# Patient Record
Sex: Male | Born: 1965 | Race: White | Hispanic: No | Smoking: Current every day smoker
Health system: Southern US, Community
[De-identification: ages and names within clinical notes are randomized; demographics above are authoritative.]

## PROBLEM LIST (undated history)

## (undated) DIAGNOSIS — J45909 Unspecified asthma, uncomplicated: Secondary | ICD-10-CM

## (undated) DIAGNOSIS — F1010 Alcohol abuse, uncomplicated: Secondary | ICD-10-CM

## (undated) DIAGNOSIS — J449 Chronic obstructive pulmonary disease, unspecified: Secondary | ICD-10-CM

## (undated) DIAGNOSIS — F419 Anxiety disorder, unspecified: Secondary | ICD-10-CM

## (undated) DIAGNOSIS — F4310 Post-traumatic stress disorder, unspecified: Secondary | ICD-10-CM

## (undated) DIAGNOSIS — T8859XA Other complications of anesthesia, initial encounter: Secondary | ICD-10-CM

## (undated) DIAGNOSIS — IMO0001 Reserved for inherently not codable concepts without codable children: Secondary | ICD-10-CM

## (undated) DIAGNOSIS — F329 Major depressive disorder, single episode, unspecified: Secondary | ICD-10-CM

## (undated) DIAGNOSIS — Z5189 Encounter for other specified aftercare: Secondary | ICD-10-CM

## (undated) DIAGNOSIS — I10 Essential (primary) hypertension: Secondary | ICD-10-CM

## (undated) DIAGNOSIS — K219 Gastro-esophageal reflux disease without esophagitis: Secondary | ICD-10-CM

## (undated) DIAGNOSIS — F32A Depression, unspecified: Secondary | ICD-10-CM

## (undated) DIAGNOSIS — E119 Type 2 diabetes mellitus without complications: Secondary | ICD-10-CM

## (undated) HISTORY — DX: Alcohol abuse, uncomplicated: F10.10

## (undated) HISTORY — PX: TONSILLECTOMY: SUR1361

## (undated) HISTORY — PX: APPENDECTOMY: SHX54

## (undated) SURGERY — Surgical Case
Anesthesia: *Unknown

---

## 2003-07-31 ENCOUNTER — Encounter (HOSPITAL_COMMUNITY): Admission: RE | Admit: 2003-07-31 | Discharge: 2003-08-30 | Payer: Self-pay | Admitting: Preventative Medicine

## 2008-03-15 ENCOUNTER — Ambulatory Visit: Payer: Self-pay | Admitting: Gastroenterology

## 2008-03-23 ENCOUNTER — Ambulatory Visit (HOSPITAL_COMMUNITY): Admission: RE | Admit: 2008-03-23 | Discharge: 2008-03-23 | Payer: Self-pay | Admitting: Gastroenterology

## 2008-11-19 ENCOUNTER — Emergency Department (HOSPITAL_COMMUNITY): Admission: EM | Admit: 2008-11-19 | Discharge: 2008-11-19 | Payer: Self-pay | Admitting: Emergency Medicine

## 2009-01-25 ENCOUNTER — Ambulatory Visit (HOSPITAL_COMMUNITY): Admission: RE | Admit: 2009-01-25 | Discharge: 2009-01-25 | Payer: Self-pay | Admitting: Family Medicine

## 2009-02-09 ENCOUNTER — Encounter: Admission: RE | Admit: 2009-02-09 | Discharge: 2009-02-09 | Payer: Self-pay | Admitting: Family Medicine

## 2009-08-01 ENCOUNTER — Emergency Department (HOSPITAL_COMMUNITY): Admission: EM | Admit: 2009-08-01 | Discharge: 2009-08-02 | Payer: Self-pay | Admitting: Emergency Medicine

## 2010-04-24 ENCOUNTER — Inpatient Hospital Stay: Payer: Self-pay | Admitting: Psychiatry

## 2010-05-06 ENCOUNTER — Emergency Department (HOSPITAL_COMMUNITY)
Admission: EM | Admit: 2010-05-06 | Discharge: 2010-05-06 | Payer: Self-pay | Source: Home / Self Care | Admitting: Emergency Medicine

## 2010-06-08 ENCOUNTER — Emergency Department (HOSPITAL_COMMUNITY)
Admission: EM | Admit: 2010-06-08 | Discharge: 2010-06-09 | Payer: Self-pay | Source: Home / Self Care | Admitting: Emergency Medicine

## 2010-09-21 LAB — BASIC METABOLIC PANEL
GFR calc Af Amer: >60 mL/min (ref >60)
GFR calc non Af Amer: >60 mL/min (ref >60)
Potassium: 3.8 mEq/L (ref 3.5–5.1)
Sodium: 134 mEq/L — ABNORMAL LOW (ref 135–145)

## 2010-09-21 LAB — CBC
HCT: 49.6 % (ref 39.0–52.0)
Hemoglobin: 17.1 g/dL — ABNORMAL HIGH (ref 13.0–17.0)
Platelets: 202 10*3/uL (ref 150–400)
RBC: 4.72 MIL/uL (ref 4.22–5.81)
WBC: 4.4 10*3/uL (ref 4.0–10.5)

## 2010-09-21 LAB — DIFFERENTIAL
Eosinophils Relative: 3 % (ref 0–5)
Lymphocytes Relative: 33 % (ref 12–46)
Lymphs Abs: 1.5 10*3/uL (ref 0.7–4.0)
Monocytes Relative: 9 % (ref 3–12)

## 2010-09-21 LAB — RAPID URINE DRUG SCREEN, HOSP PERFORMED
Amphetamines: NOT DETECTED
Barbiturates: NOT DETECTED
Benzodiazepines: POSITIVE — AB

## 2010-09-21 LAB — ETHANOL: Alcohol, Ethyl (B): 244 mg/dL — ABNORMAL HIGH (ref 0–10)

## 2010-11-18 NOTE — Assessment & Plan Note (Signed)
NAME:  Gabriel Koch, Gabriel Koch              CHART#:  [id number]   DATE:  03/15/2008                       DOB:  06-12-1966   REFERRING PHYSICIAN:  Kirk Ruths, MD.   REASON FOR CONSULTATION:  Reflux, cough, vomiting, and nausea.   HISTORY OF PRESENT ILLNESS:  The patient is a 45 year old male with a  long-standing history of GERD.  He has been seen by Dr. Karilyn Cota in the  past.  No records are available.  He reports he does not have any  heartburn or indigestion while taking Prilosec.  His problem is in the  morning, he has lots of phlegm in the back of his throat, which makes  cough, gag, and then vomits.  He smokes 2 to 3 packs a day.  He has  increased that over the last 6 to 8 months.  He also has a lot of  sneezing and coughing related to the hair spray use and the perfume use  by his daughter and his wife.  He denies any bright red blood per rectum  or black stools.  His throat does not itch.  He has no problems  swallowing.  He knows what triggers his heartburn.  If he eats and goes  to bed, he will trigger.  He has had symptoms of GERD for 21 years.  He  has history of ulcers by EGD some years ago, which was treated with  Zantac and Tagamet and then he was advanced Prilosec.  He drinks a six  pack of beer a day.  He has had more stress over the 6 to 8 months.  His  work has gone down.   PAST MEDICAL HISTORY:  1. Multiple musculoskeletal complaints (neck, back, and head).  2. GERD.   PAST SURGICAL HISTORY:  1. Tonsillectomy in 21.  2. Appendectomy due to ruptured appendix.  3. Left knee surgery.   ALLERGIES:  Beestings.   MEDICATIONS:  1. Prilosec daily.  2. Tylenol 2 in the morning.  3. Darvocet as needed.   FAMILY HISTORY:  He denies any family history of colon cancer and colon  polyps.   SOCIAL HISTORY:  He is married.  He raises 2 stepchildren now, age 12  and 36.  He is a Buyer, retail.  He smokes and he drinks  alcohol.   REVIEW OF SYSTEMS:  As per  the HPI, otherwise all systems are negative.   PHYSICAL EXAMINATION:  VITAL SIGNS:  Weight 152 pounds, height 5 feet 9  inches, BMI 22.7 (healthy), temperature 98.2, blood pressure 124/82, and  pulse 80.GENERAL:  He is in no apparent stress.  Alert and oriented  x4.HEENT:  Atraumatic and normocephalic.  Pupils equal and react to  light.  Mouth, no oral lesions.NECK:  Posterior pharynx without erythema  or exudate.  His neck has full range of motion.  No  lymphadenopathy.LUNGS:  Clear to auscultation bilaterally.  CARDIOVASCULAR:  Shows regular rhythm.  No murmur.  Normal S1 and  S2.ABDOMEN:  Bowel sounds are present.  Soft, nontender, and  nondistended.  No rebound or guarding.  His liver edge appears to be  approximately 1 cm below the right costal margin.  It was unable to  appreciate splenomegaly.EXTREMITIES:  Having no cyanosis or  edema.NEUROLOGIC:  He has no focal neurologic deficits.   ASSESSMENT:  The patient is  a 45 year old male who has early morning  vomiting secondary to cough and posterior nasal drainage.  This he  probably has allergies and these are exacerbated by his cigarette smoke.  He has alcohol dependence. Thank you for allowing me to see the patient  in consultation.  My recommendations follow.   RECOMMENDATIONS:  1. He should continue the Prilosec, symptoms of GERD are well      controlled.  He may have an EGD in age 30 to screen for Barrett's      esophagus and at the same time, he needs a colonoscopy for average      of his colon cancer screening.  2. He is asked to use Zyrtec or Claritin at bedtime to address his      early morning secretions.  3. He is asked to discontinue or decrease his smoking.  4. Will check a hepatic function panel, ultrasound, CBC, and TSH due      to his history of hepatomegaly and alcohol abuse.  5. Return patient visit in 3 months.        Kassie Mends, M.D.  Electronically Signed     SM/MEDQ  D:  03/15/2008  T:   03/16/2008  Job:  [id number]   cc:   Kirk Ruths, M.D.

## 2011-09-09 ENCOUNTER — Other Ambulatory Visit: Payer: Self-pay

## 2011-09-09 ENCOUNTER — Encounter (HOSPITAL_COMMUNITY): Payer: Self-pay | Admitting: *Deleted

## 2011-09-09 ENCOUNTER — Emergency Department (HOSPITAL_COMMUNITY)
Admission: EM | Admit: 2011-09-09 | Discharge: 2011-09-10 | Disposition: A | Discharge status: Home / Self Care | Payer: Self-pay | Attending: Emergency Medicine | Admitting: Emergency Medicine

## 2011-09-09 DIAGNOSIS — F1010 Alcohol abuse, uncomplicated: Secondary | ICD-10-CM | POA: Insufficient documentation

## 2011-09-09 DIAGNOSIS — R112 Nausea with vomiting, unspecified: Secondary | ICD-10-CM | POA: Insufficient documentation

## 2011-09-09 DIAGNOSIS — R109 Unspecified abdominal pain: Secondary | ICD-10-CM | POA: Insufficient documentation

## 2011-09-09 DIAGNOSIS — R5381 Other malaise: Secondary | ICD-10-CM | POA: Insufficient documentation

## 2011-09-09 DIAGNOSIS — I10 Essential (primary) hypertension: Secondary | ICD-10-CM | POA: Insufficient documentation

## 2011-09-09 DIAGNOSIS — K921 Melena: Secondary | ICD-10-CM | POA: Insufficient documentation

## 2011-09-09 DIAGNOSIS — K5289 Other specified noninfective gastroenteritis and colitis: Secondary | ICD-10-CM | POA: Insufficient documentation

## 2011-09-09 DIAGNOSIS — K529 Noninfective gastroenteritis and colitis, unspecified: Secondary | ICD-10-CM

## 2011-09-09 HISTORY — DX: Gastro-esophageal reflux disease without esophagitis: K21.9

## 2011-09-09 HISTORY — DX: Essential (primary) hypertension: I10

## 2011-09-09 HISTORY — DX: Anxiety disorder, unspecified: F41.9

## 2011-09-09 HISTORY — DX: Reserved for inherently not codable concepts without codable children: IMO0001

## 2011-09-09 HISTORY — DX: Encounter for other specified aftercare: Z51.89

## 2011-09-09 HISTORY — DX: Depression, unspecified: F32.A

## 2011-09-09 HISTORY — DX: Major depressive disorder, single episode, unspecified: F32.9

## 2011-09-09 MED ORDER — SODIUM CHLORIDE 0.9 % IV SOLN
Freq: Once | INTRAVENOUS | Status: AC
  Administered 2011-09-09: via INTRAVENOUS

## 2011-09-09 MED ORDER — ONDANSETRON HCL 4 MG/2ML IJ SOLN
4.0000 mg | Freq: Once | INTRAMUSCULAR | Status: AC
  Administered 2011-09-09: 4 mg via INTRAVENOUS
  Filled 2011-09-09: qty 2

## 2011-09-09 NOTE — ED Provider Notes (Signed)
History     CSN: [id number]  Arrival date & time 09/09/11  2316   First MD Initiated Contact with Patient 09/09/11 2317      Chief Complaint  Patient presents with  . Rectal Bleeding  . Fatigue  . Alcohol Intoxication    (Consider location/radiation/quality/duration/timing/severity/associated sxs/prior treatment) HPI Comments: States had "two beers" tonight.  Patient is a 46 y.o. male presenting with hematochezia and intoxication. The history is provided by the patient.  Rectal Bleeding  The current episode started yesterday. The problem occurs continuously. The problem has been rapidly worsening. The pain is moderate. The stool is described as bloody and liquid. There was no prior successful therapy. There was no prior unsuccessful therapy. Associated symptoms include abdominal pain, nausea and vomiting. Pertinent negatives include no fever.  Alcohol Intoxication Associated symptoms include abdominal pain.    Past Medical History  Diagnosis Date  . Hypertension   . Blood transfusion   . GERD (gastroesophageal reflux disease)   . Anxiety and depression     Past Surgical History  Procedure Date  . Appendectomy   . Tonsillectomy     History reviewed. No pertinent family history.  History  Substance Use Topics  . Smoking status: Current Everyday Smoker -- 0.5 packs/day for 35 years    Types: Cigarettes  . Smokeless tobacco: Not on file  . Alcohol Use: 1.2 oz/week    2 Cans of beer per week      Review of Systems  Constitutional: Negative for fever.  Gastrointestinal: Positive for nausea, vomiting, abdominal pain and hematochezia.  All other systems reviewed and are negative.    Allergies  Bee venom  Home Medications  No current outpatient prescriptions on file.  BP 154/94  Pulse 103  Temp(Src) 98.1 F (36.7 C) (Oral)  Resp 18  Ht 5\' 9"  (1.753 m)  Wt 150 lb (68.04 kg)  BMI 22.15 kg/m2  SpO2 97%  Physical Exam  Nursing note and vitals  reviewed. Constitutional: He is oriented to person, place, and time. He appears well-developed and well-nourished. No distress.       Strong odor of alcohol  HENT:  Head: Normocephalic and atraumatic.  Mouth/Throat: Oropharynx is clear and moist.  Eyes: EOM are normal. Pupils are equal, round, and reactive to light.  Neck: Normal range of motion. Neck supple.  Cardiovascular: Normal rate and regular rhythm.   No murmur heard. Pulmonary/Chest: Effort normal and breath sounds normal. No respiratory distress. He has no wheezes.  Abdominal: Soft. Bowel sounds are normal. He exhibits no distension. There is no tenderness.  Musculoskeletal: Normal range of motion. He exhibits no edema.  Lymphadenopathy:    He has no cervical adenopathy.  Neurological: He is alert and oriented to person, place, and time.  Skin: Skin is warm and dry. He is not diaphoretic.    ED Course  Procedures (including critical care time)   Labs Reviewed  CBC  DIFFERENTIAL  COMPREHENSIVE METABOLIC PANEL  LIPASE, BLOOD  ETHANOL  CARDIAC PANEL(CRET KIN+CKTOT+MB+TROPI)   No results found.   No diagnosis found.   Date: 09/10/2011  Rate: 93  Rhythm: normal sinus rhythm  QRS Axis: normal  Intervals: normal  ST/T Wave abnormalities: normal  Conduction Disutrbances:right bundle branch block  Narrative Interpretation:   Old EKG Reviewed: none available    MDM  The labs look okay with the exception of the alcohol level which is not consistent with the two beers he tells me he took.  The Hb is  stable and there has been no further vomiting or bloody stool.  He has seen Dr. Minus Liberty in the past and I will recommend he see him if symptoms persist.  He is to take prilosec 20 mg twice daily until then.  When I initially entered the room, the patient was telling me he couldn't move his fingers.  When I asked him when this started, he told me earlier this afternoon.  I walked past him on my way in to start my shift  tonight and saw him smoking a cigarette using both hands and fingers without any difficulty or limitations.        Geoffery Lyons, MD 09/10/11 [phone]

## 2011-09-09 NOTE — ED Notes (Signed)
Pt has multiple complaints. States he has not had any energy lately. Bloody stool for 4 days.

## 2011-09-10 LAB — COMPREHENSIVE METABOLIC PANEL
ALT: 30 U/L (ref 0–53)
AST: 35 U/L (ref 0–37)
Albumin: 3.5 g/dL (ref 3.5–5.2)
Alkaline Phosphatase: 131 U/L — ABNORMAL HIGH (ref 39–117)
Potassium: 3.9 mEq/L (ref 3.5–5.1)
Sodium: 139 mEq/L (ref 135–145)
Total Protein: 6.9 g/dL (ref 6.0–8.3)

## 2011-09-10 LAB — DIFFERENTIAL
Basophils Relative: 1 % (ref 0–1)
Eosinophils Absolute: 0.2 10*3/uL (ref 0.0–0.7)
Lymphs Abs: 1.7 10*3/uL (ref 0.7–4.0)
Neutrophils Relative %: 47 % (ref 43–77)

## 2011-09-10 LAB — CBC
MCH: 36 pg — ABNORMAL HIGH (ref 26.0–34.0)
MCHC: 36.4 g/dL — ABNORMAL HIGH (ref 30.0–36.0)
Platelets: 190 10*3/uL (ref 150–400)
RBC: 4.53 MIL/uL (ref 4.22–5.81)

## 2011-09-10 LAB — CARDIAC PANEL(CRET KIN+CKTOT+MB+TROPI)
CK, MB: 2.5 ng/mL (ref 0.3–4.0)
Troponin I: <0.3 ng/mL (ref <0.30)

## 2011-09-10 NOTE — Discharge Instructions (Signed)
Viral Gastroenteritis Viral gastroenteritis is also known as stomach flu. This condition affects the stomach and intestinal tract. It can cause sudden diarrhea and vomiting. The illness typically lasts 3 to 8 days. Most people develop an immune response that eventually gets rid of the virus. While this natural response develops, the virus can make you quite ill. CAUSES  Many different viruses can cause gastroenteritis, such as rotavirus or noroviruses. You can catch one of these viruses by consuming contaminated food or water. You may also catch a virus by sharing utensils or other personal items with an infected person or by touching a contaminated surface. SYMPTOMS  The most common symptoms are diarrhea and vomiting. These problems can cause a severe loss of body fluids (dehydration) and a body salt (electrolyte) imbalance. Other symptoms may include:  Fever.   Headache.   Fatigue.   Abdominal pain.  DIAGNOSIS  Your caregiver can usually diagnose viral gastroenteritis based on your symptoms and a physical exam. A stool sample may also be taken to test for the presence of viruses or other infections. TREATMENT  This illness typically goes away on its own. Treatments are aimed at rehydration. The most serious cases of viral gastroenteritis involve vomiting so severely that you are not able to keep fluids down. In these cases, fluids must be given through an intravenous line (IV). HOME CARE INSTRUCTIONS   Drink enough fluids to keep your urine clear or pale yellow. Drink small amounts of fluids frequently and increase the amounts as tolerated.   Ask your caregiver for specific rehydration instructions.   Avoid:   Foods high in sugar.   Alcohol.   Carbonated drinks.   Tobacco.   Juice.   Caffeine drinks.   Extremely hot or cold fluids.   Fatty, greasy foods.   Too much intake of anything at one time.   Dairy products until 24 to 48 hours after diarrhea stops.   You may  consume probiotics. Probiotics are active cultures of beneficial bacteria. They may lessen the amount and number of diarrheal stools in adults. Probiotics can be found in yogurt with active cultures and in supplements.   Wash your hands well to avoid spreading the virus.   Only take over-the-counter or prescription medicines for pain, discomfort, or fever as directed by your caregiver. Do not give aspirin to children. Antidiarrheal medicines are not recommended.   Ask your caregiver if you should continue to take your regular prescribed and over-the-counter medicines.   Keep all follow-up appointments as directed by your caregiver.  SEEK IMMEDIATE MEDICAL CARE IF:   You are unable to keep fluids down.   You do not urinate at least once every 6 to 8 hours.   You develop shortness of breath.   You notice blood in your stool or vomit. This may look like coffee grounds.   You have abdominal pain that increases or is concentrated in one small area (localized).   You have persistent vomiting or diarrhea.   You have a fever.   The patient is a child younger than 3 months, and he or she has a fever.   The patient is a child older than 3 months, and he or she has a fever and persistent symptoms.   The patient is a child older than 3 months, and he or she has a fever and symptoms suddenly get worse.   The patient is a baby, and he or she has no tears when crying.  MAKE SURE YOU:     Understand these instructions.   Will watch your condition.   Will get help right away if you are not doing well or get worse.  Document Released: 06/22/2005 Document Revised: 06/11/2011 Document Reviewed: 04/08/2011 Exit[employer] Patient Information 2012 [employer]. 

## 2011-09-10 NOTE — ED Notes (Signed)
Discharge instructions reviewed with pt, questions answered. Pt verbalized understanding.  

## 2012-04-15 DIAGNOSIS — R059 Cough, unspecified: Secondary | ICD-10-CM | POA: Insufficient documentation

## 2012-04-15 DIAGNOSIS — R05 Cough: Secondary | ICD-10-CM | POA: Insufficient documentation

## 2012-04-15 DIAGNOSIS — R071 Chest pain on breathing: Secondary | ICD-10-CM | POA: Insufficient documentation

## 2012-04-15 DIAGNOSIS — I10 Essential (primary) hypertension: Secondary | ICD-10-CM | POA: Insufficient documentation

## 2012-04-15 DIAGNOSIS — Z9089 Acquired absence of other organs: Secondary | ICD-10-CM | POA: Insufficient documentation

## 2012-04-15 NOTE — ED Notes (Addendum)
Pt reporting pain in right side for several weeks, also reports productive cough for several weeks.  Pt also stating that his chest is hurting.  Pt also reports drinking  40oz tonight.

## 2012-04-16 ENCOUNTER — Emergency Department (HOSPITAL_COMMUNITY): Payer: Self-pay

## 2012-04-16 ENCOUNTER — Encounter (HOSPITAL_COMMUNITY): Payer: Self-pay | Admitting: *Deleted

## 2012-04-16 ENCOUNTER — Emergency Department (HOSPITAL_COMMUNITY)
Admission: EM | Admit: 2012-04-16 | Discharge: 2012-04-16 | Disposition: A | Discharge status: Home / Self Care | Payer: Self-pay | Attending: Emergency Medicine | Admitting: Emergency Medicine

## 2012-04-16 DIAGNOSIS — R0789 Other chest pain: Secondary | ICD-10-CM

## 2012-04-16 DIAGNOSIS — R05 Cough: Secondary | ICD-10-CM

## 2012-04-16 DIAGNOSIS — R059 Cough, unspecified: Secondary | ICD-10-CM

## 2012-04-16 MED ORDER — HYDROCODONE-ACETAMINOPHEN 5-325 MG PO TABS
1.0000 | ORAL_TABLET | Freq: Once | ORAL | Status: AC
  Administered 2012-04-16: 1 via ORAL
  Filled 2012-04-16: qty 1

## 2012-04-16 MED ORDER — HYDROCODONE-ACETAMINOPHEN 5-325 MG PO TABS: 1.0000 | ORAL_TABLET | ORAL | Status: AC | PRN

## 2012-04-16 MED ORDER — IBUPROFEN 800 MG PO TABS: 800.0000 mg | ORAL_TABLET | Freq: Three times a day (TID) | ORAL | Status: DC

## 2012-04-16 MED ORDER — ALBUTEROL SULFATE (5 MG/ML) 0.5% IN NEBU
2.5000 mg | INHALATION_SOLUTION | Freq: Once | RESPIRATORY_TRACT | Status: AC
  Administered 2012-04-16: 2.5 mg via RESPIRATORY_TRACT
  Filled 2012-04-16: qty 0.5

## 2012-04-16 MED ORDER — IBUPROFEN 800 MG PO TABS
800.0000 mg | ORAL_TABLET | Freq: Once | ORAL | Status: AC
  Administered 2012-04-16: 800 mg via ORAL
  Filled 2012-04-16: qty 1

## 2012-04-16 NOTE — ED Notes (Signed)
Patient states his leg are cramping and that when vomiting in the waiting room there was blood in his vomit.
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
## 2012-04-16 NOTE — ED Notes (Signed)
Pt alert & oriented x4, stable gait. Patient given discharge instructions, paperwork & prescription(s). Patient  instructed to stop at the registration desk to finish any additional paperwork. Patient verbalized understanding. Pt left department w/ no further questions. 

## 2012-04-16 NOTE — ED Notes (Signed)
Patient given a Coke per RN approval. 
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
## 2012-04-16 NOTE — ED Notes (Signed)
Respiratory paged for a breathing treatment at this time.  

## 2012-06-16 ENCOUNTER — Inpatient Hospital Stay: Payer: Self-pay | Admitting: Psychiatry

## 2012-06-16 LAB — TSH: Thyroid Stimulating Horm: 2.02 u[IU]/mL

## 2012-06-16 LAB — COMPREHENSIVE METABOLIC PANEL
Albumin: 3.5 g/dL (ref 3.4–5.0)
Alkaline Phosphatase: 146 U/L — ABNORMAL HIGH (ref 50–136)
BUN: 4 mg/dL — ABNORMAL LOW (ref 7–18)
Calcium, Total: 8 mg/dL — ABNORMAL LOW (ref 8.5–10.1)
Glucose: 88 mg/dL (ref 65–99)
Osmolality: 272 (ref 275–301)
SGOT(AST): 53 U/L — ABNORMAL HIGH (ref 15–37)
Sodium: 138 mmol/L (ref 136–145)
Total Protein: 6.9 g/dL (ref 6.4–8.2)

## 2012-06-16 LAB — URINALYSIS, COMPLETE
Blood: NEGATIVE
Leukocyte Esterase: NEGATIVE
Nitrite: NEGATIVE
Ph: 5 (ref 4.5–8.0)
Protein: NEGATIVE
Specific Gravity: 1.002 (ref 1.003–1.030)

## 2012-06-16 LAB — CBC
HCT: 42.7 % (ref 40.0–52.0)
MCH: 36.2 pg — ABNORMAL HIGH (ref 26.0–34.0)
MCHC: 34.3 g/dL (ref 32.0–36.0)
MCV: 106 fL — ABNORMAL HIGH (ref 80–100)
RDW: 13.8 % (ref 11.5–14.5)

## 2012-06-16 LAB — DRUG SCREEN, URINE
Barbiturates, Ur Screen: NEGATIVE (ref ?–200)
Cannabinoid 50 Ng, Ur ~~LOC~~: NEGATIVE (ref ?–50)
Cocaine Metabolite,Ur ~~LOC~~: NEGATIVE (ref ?–300)
MDMA (Ecstasy)Ur Screen: NEGATIVE (ref ?–500)
Phencyclidine (PCP) Ur S: NEGATIVE (ref ?–25)

## 2012-06-17 LAB — BEHAVIORAL MEDICINE 1 PANEL
Anion Gap: 6 — ABNORMAL LOW (ref 7–16)
Calcium, Total: 8.2 mg/dL — ABNORMAL LOW (ref 8.5–10.1)
Chloride: 104 mmol/L (ref 98–107)
Co2: 28 mmol/L (ref 21–32)
EGFR (African American): >60
EGFR (Non-African Amer.): >60
Eosinophil #: 0.1 10*3/uL (ref 0.0–0.7)
Lymphocyte #: 1.1 10*3/uL (ref 1.0–3.6)
MCH: 36.9 pg — ABNORMAL HIGH (ref 26.0–34.0)
MCHC: 34.8 g/dL (ref 32.0–36.0)
MCV: 106 fL — ABNORMAL HIGH (ref 80–100)
Monocyte #: 0.3 x10 3/mm (ref 0.2–1.0)
Neutrophil %: 48 %
Platelet: 117 10*3/uL — ABNORMAL LOW (ref 150–440)
Potassium: 4 mmol/L (ref 3.5–5.1)
RDW: 13.6 % (ref 11.5–14.5)
SGOT(AST): 30 U/L (ref 15–37)
SGPT (ALT): 23 U/L (ref 12–78)
Total Protein: 5.7 g/dL — ABNORMAL LOW (ref 6.4–8.2)

## 2012-06-18 LAB — URINALYSIS, COMPLETE
Bilirubin,UR: NEGATIVE
Glucose,UR: 150 mg/dL (ref 0–75)
Leukocyte Esterase: NEGATIVE
Nitrite: NEGATIVE
RBC,UR: <1 /HPF (ref 0–5)

## 2012-07-25 ENCOUNTER — Emergency Department: Payer: Self-pay | Admitting: Emergency Medicine

## 2012-07-25 LAB — URINALYSIS, COMPLETE
Bacteria: NONE SEEN
Ketone: NEGATIVE
Leukocyte Esterase: NEGATIVE
RBC,UR: NONE SEEN /HPF (ref 0–5)
Specific Gravity: 1.001 (ref 1.003–1.030)
Squamous Epithelial: NONE SEEN

## 2012-07-25 LAB — COMPREHENSIVE METABOLIC PANEL
Albumin: 3.8 g/dL (ref 3.4–5.0)
Calcium, Total: 8.3 mg/dL — ABNORMAL LOW (ref 8.5–10.1)
Chloride: 101 mmol/L (ref 98–107)
Creatinine: 0.75 mg/dL (ref 0.60–1.30)
Glucose: 82 mg/dL (ref 65–99)
Osmolality: 264 (ref 275–301)
Potassium: 3.6 mmol/L (ref 3.5–5.1)
SGPT (ALT): 22 U/L (ref 12–78)
Sodium: 134 mmol/L — ABNORMAL LOW (ref 136–145)
Total Protein: 7.9 g/dL (ref 6.4–8.2)

## 2012-07-25 LAB — CBC
MCHC: 35.7 g/dL (ref 32.0–36.0)
MCV: 105 fL — ABNORMAL HIGH (ref 80–100)
RBC: 4.22 10*6/uL — ABNORMAL LOW (ref 4.40–5.90)
RDW: 13.1 % (ref 11.5–14.5)
WBC: 5.7 10*3/uL (ref 3.8–10.6)

## 2012-07-25 LAB — DRUG SCREEN, URINE
Barbiturates, Ur Screen: NEGATIVE (ref ?–200)
MDMA (Ecstasy)Ur Screen: NEGATIVE (ref ?–500)

## 2012-07-25 LAB — TSH: Thyroid Stimulating Horm: 3.12 u[IU]/mL

## 2012-07-26 ENCOUNTER — Emergency Department: Payer: Self-pay | Admitting: Emergency Medicine

## 2012-07-26 LAB — DRUG SCREEN, URINE
Barbiturates, Ur Screen: NEGATIVE (ref ?–200)
Cannabinoid 50 Ng, Ur ~~LOC~~: NEGATIVE (ref ?–50)
MDMA (Ecstasy)Ur Screen: NEGATIVE (ref ?–500)
Opiate, Ur Screen: NEGATIVE (ref ?–300)

## 2012-07-26 LAB — ETHANOL
Ethanol %: 0.134 % — ABNORMAL HIGH (ref 0.000–0.080)
Ethanol: 134 mg/dL

## 2012-08-22 LAB — COMPREHENSIVE METABOLIC PANEL
Albumin: 3.6 g/dL (ref 3.4–5.0)
Alkaline Phosphatase: 72 U/L (ref 50–136)
Calcium, Total: 8.5 mg/dL (ref 8.5–10.1)
Chloride: 102 mmol/L (ref 98–107)
Co2: 25 mmol/L (ref 21–32)
EGFR (African American): >60
Glucose: 57 mg/dL — ABNORMAL LOW (ref 65–99)
Osmolality: 266 (ref 275–301)
Potassium: 3.4 mmol/L — ABNORMAL LOW (ref 3.5–5.1)
SGOT(AST): 25 U/L (ref 15–37)
Sodium: 136 mmol/L (ref 136–145)
Total Protein: 7.5 g/dL (ref 6.4–8.2)

## 2012-08-22 LAB — DRUG SCREEN, URINE
Barbiturates, Ur Screen: NEGATIVE (ref ?–200)
Benzodiazepine, Ur Scrn: NEGATIVE (ref ?–200)
Cannabinoid 50 Ng, Ur ~~LOC~~: NEGATIVE (ref ?–50)
Cocaine Metabolite,Ur ~~LOC~~: NEGATIVE (ref ?–300)
MDMA (Ecstasy)Ur Screen: NEGATIVE (ref ?–500)
Phencyclidine (PCP) Ur S: NEGATIVE (ref ?–25)

## 2012-08-22 LAB — CBC
HGB: 14.4 g/dL (ref 13.0–18.0)
MCH: 36.1 pg — ABNORMAL HIGH (ref 26.0–34.0)
RBC: 3.99 10*6/uL — ABNORMAL LOW (ref 4.40–5.90)
WBC: 7.2 10*3/uL (ref 3.8–10.6)

## 2012-08-22 LAB — ACETAMINOPHEN LEVEL: Acetaminophen: <2 ug/mL

## 2012-08-22 LAB — ETHANOL: Ethanol %: 0.105 % — ABNORMAL HIGH (ref 0.000–0.080)

## 2012-08-23 ENCOUNTER — Inpatient Hospital Stay: Payer: Self-pay | Admitting: Psychiatry

## 2012-08-24 LAB — BEHAVIORAL MEDICINE 1 PANEL
Albumin: 3.1 g/dL — ABNORMAL LOW (ref 3.4–5.0)
Alkaline Phosphatase: 92 U/L (ref 50–136)
Anion Gap: 10 (ref 7–16)
BUN: 14 mg/dL (ref 7–18)
Basophil #: 0.1 10*3/uL (ref 0.0–0.1)
Basophil %: 1 %
Bilirubin,Total: 0.3 mg/dL (ref 0.2–1.0)
Calcium, Total: 8.6 mg/dL (ref 8.5–10.1)
Chloride: 107 mmol/L (ref 98–107)
Co2: 25 mmol/L (ref 21–32)
Creatinine: 0.95 mg/dL (ref 0.60–1.30)
EGFR (African American): >60
EGFR (Non-African Amer.): >60
Eosinophil #: 0.8 10*3/uL — ABNORMAL HIGH (ref 0.0–0.7)
Eosinophil %: 14.4 %
Glucose: 142 mg/dL — ABNORMAL HIGH (ref 65–99)
HCT: 38.3 % — ABNORMAL LOW (ref 40.0–52.0)
HGB: 13.3 g/dL (ref 13.0–18.0)
Lymphocyte #: 1.7 10*3/uL (ref 1.0–3.6)
Lymphocyte %: 32 %
MCH: 35.7 pg — ABNORMAL HIGH (ref 26.0–34.0)
MCHC: 34.8 g/dL (ref 32.0–36.0)
MCV: 102 fL — ABNORMAL HIGH (ref 80–100)
Monocyte #: 0.6 x10 3/mm (ref 0.2–1.0)
Monocyte %: 12 %
Neutrophil #: 2.2 10*3/uL (ref 1.4–6.5)
Neutrophil %: 40.6 %
Osmolality: 286 (ref 275–301)
Platelet: 239 10*3/uL (ref 150–440)
Potassium: 3.6 mmol/L (ref 3.5–5.1)
RBC: 3.74 10*6/uL — ABNORMAL LOW (ref 4.40–5.90)
RDW: 12.7 % (ref 11.5–14.5)
SGOT(AST): 19 U/L (ref 15–37)
SGPT (ALT): 18 U/L (ref 12–78)
Sodium: 142 mmol/L (ref 136–145)
Thyroid Stimulating Horm: 1.9 u[IU]/mL
Total Protein: 6.3 g/dL — ABNORMAL LOW (ref 6.4–8.2)
WBC: 5.3 10*3/uL (ref 3.8–10.6)

## 2012-08-24 LAB — URINALYSIS, COMPLETE
Bacteria: NONE SEEN
Bilirubin,UR: NEGATIVE
Blood: NEGATIVE
Glucose,UR: NEGATIVE mg/dL (ref 0–75)
Ketone: NEGATIVE
Leukocyte Esterase: NEGATIVE
Nitrite: NEGATIVE
Ph: 5 (ref 4.5–8.0)
Protein: NEGATIVE
RBC,UR: 3 /HPF (ref 0–5)
Specific Gravity: 1.027 (ref 1.003–1.030)
Squamous Epithelial: <1
WBC UR: 2 /HPF (ref 0–5)

## 2013-03-09 ENCOUNTER — Emergency Department: Payer: Self-pay | Admitting: Emergency Medicine

## 2013-03-09 LAB — URINALYSIS, COMPLETE
Bilirubin,UR: NEGATIVE
Blood: NEGATIVE
Glucose,UR: NEGATIVE mg/dL
Ketone: NEGATIVE
Leukocyte Esterase: NEGATIVE
Nitrite: NEGATIVE
Ph: 6
Protein: NEGATIVE
RBC,UR: NONE SEEN /HPF
Specific Gravity: 1.002
Squamous Epithelial: <1
WBC UR: <1 /HPF

## 2013-03-09 LAB — CBC
HCT: 39.6 % — ABNORMAL LOW
HGB: 14.5 g/dL
MCH: 34.4 pg — ABNORMAL HIGH
MCHC: 36.7 g/dL — ABNORMAL HIGH
MCV: 94 fL
Platelet: 264 x10 3/mm 3
RBC: 4.23 x10 6/mm 3 — ABNORMAL LOW
RDW: 12.6 %
WBC: 6.3 x10 3/mm 3

## 2013-03-09 LAB — DRUG SCREEN, URINE
Amphetamines, Ur Screen: NEGATIVE
Barbiturates, Ur Screen: NEGATIVE
Benzodiazepine, Ur Scrn: NEGATIVE
Cannabinoid 50 Ng, Ur ~~LOC~~: NEGATIVE
Cocaine Metabolite,Ur ~~LOC~~: NEGATIVE
MDMA (Ecstasy)Ur Screen: NEGATIVE
Methadone, Ur Screen: NEGATIVE
Opiate, Ur Screen: NEGATIVE
Phencyclidine (PCP) Ur S: NEGATIVE
Tricyclic, Ur Screen: NEGATIVE

## 2013-03-09 LAB — COMPREHENSIVE METABOLIC PANEL
Alkaline Phosphatase: 87 U/L (ref 50–136)
Anion Gap: 9 (ref 7–16)
Bilirubin,Total: 0.4 mg/dL (ref 0.2–1.0)
Calcium, Total: 8.4 mg/dL — ABNORMAL LOW (ref 8.5–10.1)
Creatinine: 0.86 mg/dL (ref 0.60–1.30)
Osmolality: 262 (ref 275–301)
Potassium: 2.7 mmol/L — ABNORMAL LOW (ref 3.5–5.1)

## 2013-03-09 LAB — ETHANOL
Ethanol %: 0.103 % — ABNORMAL HIGH (ref 0.000–0.080)
Ethanol: 103 mg/dL

## 2014-10-23 NOTE — Discharge Summary (Signed)
PATIENT NAME:  Gabriel Koch, Gabriel Koch MR#:  [id number] DATE OF BIRTH:  1966/04/05  DATE OF ADMISSION:  06/16/2012 DATE OF DISCHARGE:  06/24/2012  HOSPITAL COURSE: See dictated history and physical from admission. This 49 year old man with a history of alcohol dependence and anxiety problems came into the hospital intoxicated, having alcohol withdrawal, requesting detox. Mood was depressed. He was not having any suicidal ideation. He has a stated history of chronic anxiety. In the hospital, he was detoxed using the standard protocol and that went uneventfully. He did not have a seizure or show any signs of active delirium other than a little bit of confusion for a couple of days. For the last several days, his thoughts have been clear, and he has been participating appropriately on the unit. He requested further treatment for his anxiety disorder and gives a history that sounds like social anxiety. He was counseled about treatment and started on Prozac 20 mg per day. He is also taking trazodone 150 mg at night for sleep. He has tolerated medications well with no side effects. He continues to participate in groups actively. He was offered the opportunity to go to the [facility] [place] but declined it. He has denied suicidal ideation and not shown any suicidal or dangerous behavior in the hospital. The patient evidently is quite lonely and isolated at home. It was clear in the last few days of his hospital stay that he wanted to continue to stay here because he simply preferred it to the environment at home. At this point he has been here for several extra days after having had a brief fever and a bit of a cold. At this point he is now stable, not acutely dangerous, has been educated about substance abuse, and is tolerating his medication well. He will be discharged to follow-up with [facility] or similar services in the community.   DISCHARGE MEDICATIONS: Prozac 20 mg p.o. daily, ranitidine 150 mg  p.o. q.12, trazodone 150 mg at bedtime.   LABORATORY RESULTS: Admission labs showed chemistry with a slightly low calcium at 8.2, total protein slightly low at 5.7, albumin 2.8. CBC showed low white count at 2.9, hematocrit low at 37.8, MCV elevated at 106. Drug screen negative. TSH normal. Alcohol level 236. Follow-up chemistries showed slight improvement but he continued to have low protein and albumin. Urinalysis unremarkable.   MENTAL STATUS EXAM AT DISCHARGE: Casually dressed, neatly groomed man who looks his stated age. Cooperative with the exam. Good eye contact. Normal psychomotor activity. Speech normal in rate, tone, and volume. Affect euthymic, reactive and appropriate. Mood stated as being okay. Thoughts are lucid without loosening of associations or delusional thinking. Denies auditory or visual hallucinations. Denies suicidal or homicidal ideation. Reasonable judgment and insight. Baseline intelligence probably normal, short and long-term memory appear grossly intact.   DISPOSITION: Discharge home. Follow-up recommended with substance abuse treatment in the community and mental health follow-up most likely was [facility].   DIAGNOSIS PRINCIPLE AND PRIMARY:  AXIS I: Alcohol dependence.   SECONDARY DIAGNOSES:  AXIS I: Anxiety disorder, not otherwise specified.  AXIS II: Deferred.  AXIS III: Alcohol withdrawal, gastric reflux symptoms, a history of headaches.  AXIS IV: Severe from isolation and lack of support.  AXIS V: Functioning at time of discharge: 73.   ____________________________ Gonzella Lex, MD jtc:jm D: 06/24/2012 13:31:15 ET T: 06/25/2012 16:20:25 ET JOB#: [id number]  cc: Gonzella Lex, MD, <Dictator> Gonzella Lex MD ELECTRONICALLY SIGNED 06/26/2012 13:50
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
## 2014-10-26 NOTE — Consult Note (Signed)
Brief Consult Note: Diagnosis: Alcohol Dependence, Mood DO NOS.   Patient was seen by consultant.   Consult note dictated.   Recommend further assessment or treatment.   Comments: Start Librium 25mg  po BID x 2 days  Monitor for safety and discussed about going to [facility].  Will re-[place] in next 24 hours.  Electronic Signatures: Jeronimo Norma (MD)  (Signed 21-Jan-14 10:21)  Authored: Brief Consult Note   Last Updated: 21-Jan-14 10:21 by Jeronimo Norma (MD)

## 2014-10-26 NOTE — Consult Note (Signed)
Brief Consult Note: Diagnosis: ALCOHOL AND OPIOID DEPENDENCE.   Patient was seen by consultant.   Consult note dictated.   Recommend further assessment or treatment.   Orders entered.   Comments: Mr. Pintor has a h/o substance abuse. He desires substance abuse treatment.  PLAN: 1. The patient is referred to [facility].  2. Will continue detox protocol.  3. Psychiatry will follow up.  Electronic Signatures: Orson Slick (MD)  (Signed 05-Sep-14 15:10)  Authored: Brief Consult Note   Last Updated: 05-Sep-14 15:10 by Orson Slick (MD)

## 2014-10-26 NOTE — Consult Note (Signed)
PATIENT NAME:  Gabriel Koch, Gabriel Koch MR#:  [id number] DATE OF BIRTH:  08-17-1965  DATE OF ADMISSION: 03/09/2013  DATE OF CONSULTATION:  03/10/2013  REQUESTING PHYSICIAN: Dr. Marjean Donna  CONSULTING PHYSICIAN:  Jolanta B. Pucilowska, MD  REASON FOR CONSULTATION: To evaluate a suicidal patient.   IDENTIFYING DATA: Gabriel Koch is a 49 year old male with history of mood instability and substance abuse.   CHIEF COMPLAINT: "I need help."    HISTORY OF PRESENT ILLNESS: Gabriel Koch came to the Emergency Room asking for help. It is unclear whether this was precipitated by the fact that he just lost housing, or whether he truly wants to get better. He has been to substance abuse treatment multiple times, but explains to me that it was never his idea and he has never been interested. This time he is. He reports that he was just released from prison after 6 months. He spent time in jail for probation violation. 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 He on one occasion tells me that he sleeps with his family and 4 children, on another that he is homeless, used to stay with friends but [place] the [place]. In addition to alcohol, he reports that he had two 40-ounce beers on the day of admission. He uses narcotic pain killers. He reports that while in jail, he was not given any of his medication that was prescribed during his most recent hospitalization in February, but he also reminds me that he has no money to pay for them. Therefore, he has not been taking his medications in the community, either. He tells me in secret, between you and me, that he has been very, very depressed and thinks of suicide frequently. He denies psychotic symptoms, denies symptoms suggestive of bipolar mania. He denies illicit substance use, but endorses use of prescription pills.   PAST PSYCHIATRIC HISTORY: He has been to rehab at least twice in the past. He is asking for long-term substance abuse treatment  program, and would gladly go to [facility] for 2 years. He asks me to vouch for him there. He denies suicide attempt. He was admitted to [facility] at least 3 times in the past for alcohol dependence and depression.   FAMILY PSYCHIATRIC HISTORY:  Father with drinking problem.   PAST MEDICAL HISTORY:  Gastric reflux, alcoholic liver disease.   MEDICATIONS ON ADMISSION: None.   ALLERGIES: BEE STINGS.   SOCIAL HISTORY: Totally unclear. I think he is homeless. He has no income or health insurance. He was recently jailed. It is unclear whether his legal troubles are over.   REVIEW OF SYSTEMS: CONSTITUTIONAL: No fevers or chills. No weight changes.  EYES: No double or blurred vision.  EARS, NOSE, THROAT: No hearing loss.  RESPIRATORY: No shortness of breath or cough.  CARDIOVASCULAR: No chest pain or orthopnea.  GASTROINTESTINAL: No abdominal pain, nausea, vomiting or diarrhea.  GENITOURINARY: No incontinence or frequency.  ENDOCRINE: No heat or cold intolerance.  LYMPHATIC: No anemia or easy bruising.  INTEGUMENTARY: No acne or rash.  MUSCULOSKELETAL: No muscle or joint pain.  NEUROLOGIC: No tingling or weakness.  PSYCHIATRIC: See history of present illness for details.   PHYSICAL EXAMINATION: VITAL SIGNS: Blood pressure 123/65, pulse 82, respirations 18, temperature 98.3.  GENERAL: This is well-developed male in no acute distress.   LABORATORY DATA: Chemistries are within normal limits, except for sodium 132, potassium 2.7. Blood alcohol level is 0.103. LFTs within normal limits. Urine  tox screen negative for substances. CBC within normal limits, with mild anemia. Urinalysis is not suggestive of urinary tract infection.   MENTAL STATUS EXAMINATION: The patient is alert and oriented to person, place, time and situation. He is pleasant, polite and cooperative. It is unclear whether he is slightly confused or confabulates for me, answering the same question  differently. He is examined in the Emergency Room. He is in bed, wearing hospital scrubs. He maintains good eye contact. His speech is soft. Mood is depressed, with flat affect. Thought processing is logical. Thought content: He denies suicidal or homicidal ideation, but initially stated that he was suicidal. There are no delusions or paranoia. There are no auditory or visual hallucinations. His cognition is grossly intact. His insight and judgment are poor.   SUICIDE RISK ASSESSMENT: This is a patient with long history of substance abuse and mood instability, who threatened suicide while under the influence. He is at increased risk of suicide.   DIAGNOSES: AXIS I:  Alcohol dependence. Major depressive disorder, recurrent, severe.  AXIS II:  Deferred.  AXIS III:  Gastroesophageal reflux disease.  AXIS IV: Mental illness, substance abuse, treatment compliance, employment, financial, housing, primary support, access to care.  AXIS V:  GAF 25.   PLAN:  1.  The patient is on IVC. He was referred to [facility] rehab facility. He agrees with the plan.  2.  He was started on CIWA protocol to prevent alcohol withdrawal seizures.  3.  Psychiatry will follow up.    ____________________________ Wardell Honour. Bary Leriche, MD jbp:mr D: 03/10/2013 22:02:44 ET T: 03/10/2013 22:42:22 ET JOB#: [id number]  cc: Jolanta B. Bary Leriche, MD, <Dictator> Clovis Fredrickson MD ELECTRONICALLY SIGNED 03/14/2013 6:29
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
## 2014-10-26 NOTE — Consult Note (Signed)
PATIENT NAME:  Gabriel Koch, Gabriel Koch MR#:  [id number] DATE OF BIRTH:  [birth year]  DATE OF CONSULTATION:  08/23/2012  CONSULTING PHYSICIAN:  Zhaniya Swallows S. Gretel Acre, MD  REASON FOR CONSULTATION: Alcohol abuse.   HISTORY OF PRESENT ILLNESS: The patient is a 49 year old Caucasian male who presented to the Emergency Room stating that he needs help. The patient was recently discharged from [facility] last week where he was stayed for almost 1 week. After he was discharged, he started using alcohol again. The patient reported that he has a history of depression as well as bipolar disorder. He consumed two 40-ounces of beer today. He was evaluated in the Emergency Department.   During my interview, the patient was very agitated and anxious and he stated that he does not want to participate. He reported that he has already given the history to 6 other people and currently he is having symptoms of GERD as well as headache. He reported that he does not want to tell me the whole story over and over again. The patient appeared to be very angry and upset and he covered himself with the sheet. He appeared to be withdrawing from alcohol and appeared to be very anxious and agitated. He did not provide any significant history.   Most of the history was obtained from his records.   PAST PSYCHIATRIC HISTORY: The patient has a long history of using alcohol. He has been to [facility] multiple times in the past and remains noncompliant with his treatment. He has been to the jail for DWIs in the past. He denied that he had a past charge for communicating threats. He was on probation in the past.The patient continues to drink. He has poor insight about his use of alcohol.   SUBSTANCE ABUSE HISTORY: The patient started drinking since the age of 65. His last use was yesterday when he consumed two 40-ounces of beers. His average use is 1 to 2 drinks per day. He denied any history of seizures or blackouts. He admits to having withdrawal symptoms  including anxiety, headaches worse than tremors.   SOCIAL HISTORY: The patient reported that he is currently homeless and does not have any consistent stable job or living situation.   HOME MEDICATIONS:  1.  Trazodone 150 mg p.o. at bedtime. 2.  Zantac 150 mg p.o. daily. 3.  Prozac 20 mg p.o. daily.   ALLERGIES: BEE STINGS.   PAST MEDICAL HISTORY: Hypertension, tonsillectomy, appendectomy.   MENTAL STATUS EXAMINATION: The patient is a moderately built male who appeared agitated and anxious during the interview. He maintained poor eye contact. His speech was loud and rapid. He demonstrated poor insight and judgment regarding his use of alcohol. He did not provide any significant history.  VITAL SIGNS: Temperature 97.7, pulse 73, respirations 18, blood pressure 136/71.  LABORATORY DATA: Glucose 57, BUN 3, creatinine 0.80, sodium 136, potassium 3.4, chloride 102, bicarbonate 25, anion gap 9. Blood alcohol level of 105. Protein 7.5, albumin 3.6, bilirubin 0.4, alkaline phosphatase 72, AST 25, ALT 23. TSH 2.09. Urine drug screen was negative. WBC 7.2, RBC 3.99, hemoglobin 14.4, hematocrit 40.6, platelet count 301, MCV 102, MCH 36.1, MCHC 35.4, RDW 12.5.   DIAGNOSTIC IMPRESSION:  AXIS I:  1.  Major depressive disorder, recurrent, severe without psychotic features.  2.  Alcohol dependence.  3.  Alcohol withdrawal.   TREATMENT PLAN:  1.  The patient will be given Librium 50 mg 1-time dose to control his agitation.  2.  He will be started on  Librium 25 mg p.o. b.i.d. for 2 days.  3.  Case discussed with Dr. Bary Leriche and he will be admitted to the inpatient behavioral health unit for stabilization and safety, since the patient has poor insight regarding his use of alcohol and he is at high risk of going into withdrawal symptoms and continues to abuse alcohol, to control his depression at this time. The patient will be admitted to the inpatient behavioral health unit at this time.   Thank you  for allowing me to participate in the care of this patient.     ____________________________ Cordelia Pen. Gretel Acre, MD usf:jm D: 08/23/2012 17:03:47 ET T: 08/23/2012 21:28:38 ET JOB#: [id number]  cc: Cordelia Pen. Gretel Acre, MD, <Dictator> Jeronimo Norma MD ELECTRONICALLY SIGNED 08/24/2012 21:15

## 2014-10-26 NOTE — H&P (Signed)
DATE OF BIRTH:  1966-03-28  DATE OF EVALUATION:  08/24/2012  IDENTIFYING INFORMATION AND CHIEF COMPLAINT:  This is a 49 year old man who came to the Emergency Room, saying that he was having suicidal thoughts and that he had been drinking again.   CHIEF COMPLAINT:  "I'm back to square one."   HISTORY OF PRESENT ILLNESS:  The patient states that after having gotten out of [facility] recently, he went back home to his family property. He tells a story that is long and rather confusing about why he is not able to stay there. He seems to imply that his brother had somehow cheated him out of his ability to live on the family property. In any case,  patient states that for a few days he was staying with various friends, and felt like he had no place to stay. He went back to drinking. He was drinking about two 40-ounces bottles of beer a day. Felt more hopeless and helpless, and came to the Emergency Room. The patient endorses chronic low mood and negative thoughts about himself. Sleeps poorly at night. Has general anxiety and social anxiety. His symptoms are not constant, however, and he was able to enjoy himself, and actually felt upbeat while he was at the alcohol and drug abuse treatment center. He denies any psychotic symptoms. Denies any suicidal ideation currently when I am speaking to him today. He said that he had been on medicine that was prescribed at [facility].   PAST PSYCHIATRIC HISTORY:  The patient has been to our Emergency Room several times over the last month, and had a hospital stay back in December. All similar kinds of circumstances, where he presents with alcohol abuse and depressed mood. The patient has made threats to kill himself in the past. It looks like he has been able to go to [facility], and occasionally has been able to maintain some periods of sobriety, but it is not clear for how long. It seems like things have gotten consistently worse for him recently. He has been treated with both Effexor  and more recently Prozac, and said that they have been helpful for his depression. It is documented in the past that he had said at one time he tried to shoot himself, but there is no evidence that he actually did.   PAST MEDICAL HISTORY:  The patient has acid reflux symptoms. He had been losing weight. May have some liver damage, but otherwise no significant ongoing medical problems.   SOCIAL HISTORY:  The patient is separated long-term from his wife. He refuses to cooperate with actually going through with the divorce. He is not currently able to work. I could not follow all of the ins and outs of the story he told, but it sounds like his family is not letting him come back and stay on the family property right now. The patient has not been working in quite a while.   FAMILY HISTORY:  Positive for substance abuse.   CURRENT MEDICATIONS:  Prozac 20 mg a day, trazodone 150 mg at night, Zantac 150 mg twice a day.   ALLERGIES:  BEE STINGS.  REVIEW OF SYSTEMS:  Complains of depressed mood, anxiety, poor self-esteem, fatigue, passive suicidal thoughts, hopelessness. Denies auditory or visual hallucinations. Denies other physical symptoms right now, except for some heartburn.   MENTAL STATUS EXAMINATION:  A neatly dressed and groomed man who looks his stated age. He looks more healthy than he did back in December. Eye contact is good. Psychomotor activity  normal. Speech normal in rate, tone and volume. Affect is mildly histrionic, pleading,  somewhat sad and anxious. Mood is stated as bad. Thoughts are generally lucid. No evidence of delusional thinking or loosening of associations. Denies paranoia. Denies suicidal or homicidal ideation right now. Denies hallucinations. Judgment and insight chronically impaired about his contribution to his own problems. Intelligence probably normal.   PHYSICAL EXAMINATION: GENERAL:  The patient does not appear to be in any acute physical distress.  SKIN:  No skin  lesions identified.  HEENT: Pupils equal and reactive. Face symmetric. Oral mucosa normal.  BACK AND NECK:  Nontender.  MUSCULOSKELETAL: Full range of motion at all extremities. Normal gait. Strength and reflexes normal and symmetric throughout.  NEUROLOGIC:  Cranial nerves symmetric.  LUNGS:  Clear with no wheezes.  HEART:  Regular rate and rhythm.  ABDOMEN:  Soft, nontender, normal bowel sounds.  VITAL SIGNS:  Temperature 97.6, pulse 80, respirations 18, blood pressure 149/92.   LABORATORY RESULTS:  Most recent labs showed a glucose slightly elevated at 142. Total protein low at 6.3, albumin low at 3.1. Hematocrit low at 38.3. Urinalysis unremarkable. Drug    screen negative. TSH normal at 2.09. Salicylates undetected. Alcohol was 105 when he presented to the Emergency Room.   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 It sounds like he does not have a place to stay. Very dependent in his behavior.   TREATMENT PLAN: Detox by evaluating vital signs and physical exam. Restart Prozac, trazodone and Zantac. Add omeprazole because of persistent gastric reflux. Engage him in groups and activities particularly directed at substance abuse treatment. Work on finding a discharge plan for him.   DIAGNOSIS, PRINCIPAL AND PRIMARY:   AXIS I:  Alcohol dependence.  SECONDARY DIAGNOSES:    AXIS I:  Depression, not otherwise specified.     Social anxiety disorder.   AXIS II:  Dependent and borderline features.   AXIS III:  Gastric reflux, rule out liver damage.   AXIS IV:  Severe from homelessness.   AXIS V:  Functioning at time of evaluation:  76.      ____________________________ Gonzella Lex, MD jtc:mr D: 08/24/2012 19:51:00 ET T: 08/24/2012 20:54:34 ET JOB#: [id number] cc: Gonzella Lex, MD, <Dictator> Gonzella Lex  MD ELECTRONICALLY SIGNED 08/26/2012 9:24

## 2018-12-02 ENCOUNTER — Other Ambulatory Visit: Payer: Self-pay

## 2018-12-02 ENCOUNTER — Emergency Department (HOSPITAL_COMMUNITY): Payer: Self-pay

## 2018-12-02 ENCOUNTER — Emergency Department (HOSPITAL_COMMUNITY)
Admission: EM | Admit: 2018-12-02 | Discharge: 2018-12-02 | Disposition: A | Discharge status: Home / Self Care | Payer: Self-pay | Attending: Emergency Medicine | Admitting: Emergency Medicine

## 2018-12-02 ENCOUNTER — Encounter (HOSPITAL_COMMUNITY): Payer: Self-pay | Admitting: Emergency Medicine

## 2018-12-02 DIAGNOSIS — S2231XA Fracture of one rib, right side, initial encounter for closed fracture: Secondary | ICD-10-CM | POA: Insufficient documentation

## 2018-12-02 DIAGNOSIS — W010XXA Fall on same level from slipping, tripping and stumbling without subsequent striking against object, initial encounter: Secondary | ICD-10-CM | POA: Insufficient documentation

## 2018-12-02 DIAGNOSIS — I10 Essential (primary) hypertension: Secondary | ICD-10-CM | POA: Insufficient documentation

## 2018-12-02 DIAGNOSIS — F17210 Nicotine dependence, cigarettes, uncomplicated: Secondary | ICD-10-CM | POA: Insufficient documentation

## 2018-12-02 DIAGNOSIS — Y939 Activity, unspecified: Secondary | ICD-10-CM | POA: Insufficient documentation

## 2018-12-02 DIAGNOSIS — Y999 Unspecified external cause status: Secondary | ICD-10-CM | POA: Insufficient documentation

## 2018-12-02 DIAGNOSIS — Y92007 Garden or yard of unspecified non-institutional (private) residence as the place of occurrence of the external cause: Secondary | ICD-10-CM | POA: Insufficient documentation

## 2018-12-02 DIAGNOSIS — F10920 Alcohol use, unspecified with intoxication, uncomplicated: Secondary | ICD-10-CM | POA: Insufficient documentation

## 2018-12-02 MED ORDER — LIDOCAINE 5 % EX PTCH: 1.0000 | MEDICATED_PATCH | CUTANEOUS | 0 refills | Status: DC

## 2018-12-02 MED ORDER — LIDOCAINE 5 % EX PTCH
1.0000 | MEDICATED_PATCH | CUTANEOUS | Status: DC
  Administered 2018-12-02: 1 via TRANSDERMAL
  Filled 2018-12-02: qty 1

## 2018-12-02 MED ORDER — NAPROXEN 500 MG PO TABS: 500.0000 mg | ORAL_TABLET | Freq: Two times a day (BID) | ORAL | 0 refills | Status: DC

## 2018-12-02 MED ORDER — NAPROXEN 250 MG PO TABS
250.0000 mg | ORAL_TABLET | Freq: Once | ORAL | Status: AC
  Administered 2018-12-02: 250 mg via ORAL
  Filled 2018-12-02: qty 1

## 2018-12-02 NOTE — Discharge Instructions (Addendum)
You were evaluated in the Emergency Department and after careful evaluation, we did not find any emergent condition requiring admission or further testing in the hospital.  Your symptoms today seem to be due to a rib fracture.  Please use the medications provided to help with your pain.  Is important to take deep breaths to prevent pneumonia.  Please return to the Emergency Department if you experience any worsening of your condition.  We encourage you to follow up with a primary care provider.  Thank you for allowing [place] to be a part of your care.

## 2018-12-02 NOTE — ED Provider Notes (Signed)
[facility] Emergency Department Provider Note MRN:  [id number]  Arrival date & time: 12/02/18     Chief Complaint   Fall (right rib pain)   History of Present Illness   Gabriel Koch is a 53 y.o. year-old male with a history of GERD, hypertension, tobacco abuse presenting to the ED with chief complaint of fall.  Patient is endorsing right-sided rib pain.  Explains that he fell 1 or 2 weeks ago while at work, tripped.  Explains that it happened again today, landed on the same side, causing exacerbation of the injury that was healing.  Denies head trauma or loss of consciousness, no chest pain or shortness of breath, no abdominal pain.  Pain is constant, worse with motion or palpation.  Review of Systems  A complete 10 system review of systems was obtained and all systems are negative except as noted in the HPI and PMH.   Patient's Health History    Past Medical History:  Diagnosis Date  . Anxiety and depression   . Blood transfusion   . GERD (gastroesophageal reflux disease)   . Hypertension     Past Surgical History:  Procedure Laterality Date  . APPENDECTOMY    . TONSILLECTOMY      History reviewed. No pertinent family history.  Social History   Socioeconomic History  . Marital status: Legally Separated    Spouse name: Not on file  . Number of children: Not on file  . Years of education: Not on file  . Highest education level: Not on file  Occupational History  . Not on file  Social Needs  . Financial resource strain: Not on file  . Food insecurity:    Worry: Not on file    Inability: Not on file  . Transportation needs:    Medical: Not on file    Non-medical: Not on file  Tobacco Use  . Smoking status: Current Every Day Smoker    Packs/day: 1.00    Years: 35.00    Pack years: 35.00    Types: Cigarettes  Substance and Sexual Activity  . Alcohol use: Yes    Alcohol/week: 2.0 standard drinks    Types: 2 Cans of beer per week  . Drug  use: No  . Sexual activity: Not on file  Lifestyle  . Physical activity:    Days per week: Not on file    Minutes per session: Not on file  . Stress: Not on file  Relationships  . Social connections:    Talks on phone: Not on file    Gets together: Not on file    Attends religious service: Not on file    Active member of club or organization: Not on file    Attends meetings of clubs or organizations: Not on file    Relationship status: Not on file  . Intimate partner violence:    Fear of current or ex partner: Not on file    Emotionally abused: Not on file    Physically abused: Not on file    Forced sexual activity: Not on file  Other Topics Concern  . Not on file  Social History Narrative  . Not on file     Physical Exam  Vital Signs and Nursing Notes reviewed Vitals:   12/02/18 2035  BP: 136/89  Pulse: 92  Resp: 18  Temp: 98 F (36.7 C)  SpO2: 99%    CONSTITUTIONAL: Chronically ill-appearing, NAD NEURO:  Alert and oriented x 3, no  focal deficits, mildly inebriated EYES:  eyes equal and reactive ENT/NECK:  no LAD, no JVD CARDIO: Regular rate, well-perfused, normal S1 and S2; tenderness to palpation to the right lateral ribs PULM:  CTAB no wheezing or rhonchi GI/GU:  normal bowel sounds, non-distended, non-tender MSK/SPINE:  No gross deformities, no edema SKIN:  no rash, atraumatic PSYCH:  Appropriate speech and behavior  Diagnostic and Interventional Summary    Labs Reviewed - No data to display  DG Ribs Unilateral W/Chest Right  Final Result      Medications  lidocaine (LIDODERM) 5 % 1 patch (1 patch Transdermal Patch Applied 12/02/18 2202)  naproxen (NAPROSYN) tablet 250 mg (250 mg Oral Given 12/02/18 2213)     Procedures Critical Care  ED Course and Medical Decision Making  I have reviewed the triage vital signs and the nursing notes.  Pertinent labs & imaging results that were available during my care of the patient were reviewed by me and  considered in my medical decision making (see below for details).  Consistent with musculoskeletal or right-sided chest wall pain due to trauma, clearly tender to palpation, clear mechanism, nothing to suggest cardiac etiology.  No other injuries.  X-ray reveals no pneumothorax, will follow-up formal radiology read, provide pain regimen, discharged with instructions to take deep breaths to prevent pneumonia.  X-ray reveals single rib fracture, provided with pain regimen, work note.   After the discussed management above, the patient was determined to be safe for discharge.  The patient was in agreement with this plan and all questions regarding their care were answered.  ED return precautions were discussed and the patient will return to the ED with any significant worsening of condition.    Barth Kirks. Sedonia Small, [place] [email or web address]  Final Clinical Impressions(s) / ED Diagnoses     ICD-10-CM   1. Closed fracture of one rib of right side, initial encounter S22.31XA     ED Discharge Orders         Ordered    naproxen (NAPROSYN) 500 MG tablet  2 times daily     12/02/18 2206    lidocaine (LIDODERM) 5 %  Every 24 hours     12/02/18 2206             Maudie Flakes, MD 12/02/18 2339

## 2018-12-02 NOTE — ED Triage Notes (Signed)
Patient came in by EMS. Patient had a fall 2 hours ago from slipping in the yard. Patient complaining of right sided rib pain. Patient denies being on any blood thinners. Patient did have a past fall 2 weeks ago. Patient has had alcohol today.

## 2018-12-26 ENCOUNTER — Emergency Department (HOSPITAL_COMMUNITY): Payer: Self-pay

## 2018-12-26 ENCOUNTER — Other Ambulatory Visit: Payer: Self-pay

## 2018-12-26 ENCOUNTER — Emergency Department (HOSPITAL_COMMUNITY)
Admission: EM | Admit: 2018-12-26 | Discharge: 2018-12-26 | Disposition: A | Discharge status: Home / Self Care | Payer: Self-pay | Attending: Emergency Medicine | Admitting: Emergency Medicine

## 2018-12-26 ENCOUNTER — Encounter (HOSPITAL_COMMUNITY): Payer: Self-pay | Admitting: Emergency Medicine

## 2018-12-26 DIAGNOSIS — Z23 Encounter for immunization: Secondary | ICD-10-CM | POA: Insufficient documentation

## 2018-12-26 DIAGNOSIS — F17210 Nicotine dependence, cigarettes, uncomplicated: Secondary | ICD-10-CM | POA: Insufficient documentation

## 2018-12-26 DIAGNOSIS — I10 Essential (primary) hypertension: Secondary | ICD-10-CM | POA: Insufficient documentation

## 2018-12-26 DIAGNOSIS — R1013 Epigastric pain: Secondary | ICD-10-CM | POA: Insufficient documentation

## 2018-12-26 LAB — CBC
HCT: 42.8 % (ref 39.0–52.0)
Hemoglobin: 15.8 g/dL (ref 13.0–17.0)
MCH: 38.3 pg — ABNORMAL HIGH (ref 26.0–34.0)
MCHC: 36.9 g/dL — ABNORMAL HIGH (ref 30.0–36.0)
MCV: 103.9 fL — ABNORMAL HIGH (ref 80.0–100.0)
Platelets: 100 10*3/uL — ABNORMAL LOW (ref 150–400)
RBC: 4.12 MIL/uL — ABNORMAL LOW (ref 4.22–5.81)
RDW: 11.6 % (ref 11.5–15.5)
WBC: 4 10*3/uL (ref 4.0–10.5)
nRBC: 0 % (ref 0.0–0.2)

## 2018-12-26 LAB — COMPREHENSIVE METABOLIC PANEL WITH GFR
ALT: 191 U/L — ABNORMAL HIGH (ref 0–44)
AST: 214 U/L — ABNORMAL HIGH (ref 15–41)
Albumin: 4.4 g/dL (ref 3.5–5.0)
Alkaline Phosphatase: 93 U/L (ref 38–126)
Anion gap: 15 (ref 5–15)
BUN: 8 mg/dL (ref 6–20)
CO2: 20 mmol/L — ABNORMAL LOW (ref 22–32)
Calcium: 8.6 mg/dL — ABNORMAL LOW (ref 8.9–10.3)
Chloride: 95 mmol/L — ABNORMAL LOW (ref 98–111)
Creatinine, Ser: 0.89 mg/dL (ref 0.61–1.24)
GFR calc Af Amer: >60 mL/min
GFR calc non Af Amer: >60 mL/min
Glucose, Bld: 99 mg/dL (ref 70–99)
Potassium: 3.8 mmol/L (ref 3.5–5.1)
Sodium: 130 mmol/L — ABNORMAL LOW (ref 135–145)
Total Bilirubin: 1 mg/dL (ref 0.3–1.2)
Total Protein: 8 g/dL (ref 6.5–8.1)

## 2018-12-26 LAB — LIPASE, BLOOD: Lipase: 65 U/L — ABNORMAL HIGH (ref 11–51)

## 2018-12-26 LAB — TROPONIN I: Troponin I: <0.03 ng/mL

## 2018-12-26 MED ORDER — ALUM & MAG HYDROXIDE-SIMETH 200-200-20 MG/5ML PO SUSP
30.0000 mL | Freq: Once | ORAL | Status: AC
  Administered 2018-12-26: 09:00:00 30 mL via ORAL
  Filled 2018-12-26: qty 30

## 2018-12-26 MED ORDER — ESOMEPRAZOLE MAGNESIUM 40 MG PO CPDR: 40.0000 mg | DELAYED_RELEASE_CAPSULE | Freq: Every day | ORAL | 1 refills | Status: DC

## 2018-12-26 MED ORDER — LIDOCAINE VISCOUS HCL 2 % MT SOLN
15.0000 mL | Freq: Once | OROMUCOSAL | Status: AC
  Administered 2018-12-26: 09:00:00 15 mL via ORAL
  Filled 2018-12-26: qty 15

## 2018-12-26 MED ORDER — TETANUS-DIPHTH-ACELL PERTUSSIS 5-2.5-18.5 LF-MCG/0.5 IM SUSP
0.5000 mL | Freq: Once | INTRAMUSCULAR | Status: AC
  Administered 2018-12-26: 09:00:00 0.5 mL via INTRAMUSCULAR
  Filled 2018-12-26: qty 0.5

## 2018-12-26 NOTE — ED Triage Notes (Signed)
Pt endorses multiple complaints including continued right rib pain with no injury which he was seen for about 2 weeks ago. Has been vomiting. Also has a rash to legs which appear to be from picking at skin. Is drinking about 4-5 beers daily.

## 2018-12-26 NOTE — ED Notes (Signed)
ED Provider at bedside. 

## 2018-12-26 NOTE — ED Provider Notes (Signed)
[facility] Emergency Department Provider Note MRN:  [id number]  Arrival date & time: 12/26/18     Chief Complaint   Multiple Complaints   History of Present Illness   Gabriel Koch is a 53 y.o. year-old male with a history of GERD, hypertension presenting to the ED with chief complaint of multiple complaints.  Patient ran out of his Nexium 3 days ago and since then has been experiencing nausea, few episodes of nonbloody nonbilious emesis, epigastric abdominal pain, which is moderate in severity.  This vomiting and increased cough recently has caused his right-sided rib pain to worsen.  Patient experienced a fall and a rib fracture a few weeks ago.  Denies fever, no central chest pain, no shortness of breath, no lower abdominal pain, no dysuria.  Review of Systems  A complete 10 system review of systems was obtained and all systems are negative except as noted in the HPI and PMH.   Patient's Health History    Past Medical History:  Diagnosis Date  . Anxiety and depression   . Blood transfusion   . GERD (gastroesophageal reflux disease)   . Hypertension     Past Surgical History:  Procedure Laterality Date  . APPENDECTOMY    . TONSILLECTOMY      History reviewed. No pertinent family history.  Social History   Socioeconomic History  . Marital status: Legally Separated    Spouse name: Not on file  . Number of children: Not on file  . Years of education: Not on file  . Highest education level: Not on file  Occupational History  . Not on file  Social Needs  . Financial resource strain: Not on file  . Food insecurity    Worry: Not on file    Inability: Not on file  . Transportation needs    Medical: Not on file    Non-medical: Not on file  Tobacco Use  . Smoking status: Current Every Day Smoker    Packs/day: 1.00    Years: 35.00    Pack years: 35.00    Types: Cigarettes  Substance and Sexual Activity  . Alcohol use: Yes    Alcohol/week: 6.0  standard drinks    Types: 6 Cans of beer per week    Comment: daily  . Drug use: No  . Sexual activity: Not on file  Lifestyle  . Physical activity    Days per week: Not on file    Minutes per session: Not on file  . Stress: Not on file  Relationships  . Social Herbalist on phone: Not on file    Gets together: Not on file    Attends religious service: Not on file    Active member of club or organization: Not on file    Attends meetings of clubs or organizations: Not on file    Relationship status: Not on file  . Intimate partner violence    Fear of current or ex partner: Not on file    Emotionally abused: Not on file    Physically abused: Not on file    Forced sexual activity: Not on file  Other Topics Concern  . Not on file  Social History Narrative  . Not on file     Physical Exam  Vital Signs and Nursing Notes reviewed Vitals:   12/26/18 0813 12/26/18 0925  BP: (!) 160/111   Pulse: 98 93  Resp: 18 16  Temp: 98.4 F (36.9 C)  SpO2: 96% 97%    CONSTITUTIONAL: Chronically ill-appearing, NAD NEURO:  Alert and oriented x 3, no focal deficits EYES:  eyes equal and reactive ENT/NECK:  no LAD, no JVD CARDIO: Regular rate, well-perfused, normal S1 and S2 PULM:  CTAB no wheezing or rhonchi GI/GU:  normal bowel sounds, non-distended, moderate epigastric tenderness to palpation MSK/SPINE:  No gross deformities, no edema SKIN:  no rash, atraumatic PSYCH:  Appropriate speech and behavior  Diagnostic and Interventional Summary    EKG Interpretation  Date/Time:  Monday December 26 2018 08:53:54 EDT Ventricular Rate:  92 PR Interval:    QRS Duration: 133 QT Interval:  363 QTC Calculation: 449 R Axis:   89 Text Interpretation:  Sinus rhythm Prolonged PR interval Right bundle branch block Confirmed by Gerlene Fee [id number]) on 12/26/2018 10:09:35 AM      Labs Reviewed  CBC - Abnormal; Notable for the following components:      Result Value   RBC 4.12 (*)     MCV 103.9 (*)    MCH 38.3 (*)    MCHC 36.9 (*)    Platelets 100 (*)    All other components within normal limits  COMPREHENSIVE METABOLIC PANEL - Abnormal; Notable for the following components:   Sodium 130 (*)    Chloride 95 (*)    CO2 20 (*)    Calcium 8.6 (*)    AST 214 (*)    ALT 191 (*)    All other components within normal limits  LIPASE, BLOOD - Abnormal; Notable for the following components:   Lipase 65 (*)    All other components within normal limits  TROPONIN I    US Abdomen Limited RUQ  Final Result    DG ABD ACUTE 2+V W 1V CHEST  Final Result      Medications  alum & mag hydroxide-simeth (MAALOX/MYLANTA) 200-200-20 MG/5ML suspension 30 mL (30 mLs Oral Given 12/26/18 0843)    And  lidocaine (XYLOCAINE) 2 % viscous mouth solution 15 mL (15 mLs Oral Given 12/26/18 0843)  Tdap (BOOSTRIX) injection 0.5 mL (0.5 mLs Intramuscular Given 12/26/18 0845)     Procedures Critical Care  ED Course and Medical Decision Making  I have reviewed the triage vital signs and the nursing notes.  Pertinent labs & imaging results that were available during my care of the patient were reviewed by me and considered in my medical decision making (see below for details).  Patient is 53 years old, heavy drinker, tobacco abuse, ran out of his GERD medication here with epigastric pain, vomiting, suspect flare of gastritis, also considering pancreatitis, unlikely to be cardiac in etiology, will x-ray to exclude pneumothorax, attempt GI cocktail.  Labs reveal chronic hyponatremia, minimally elevated lipase, AST and ALT elevated in the 100s with a pattern suggestive of alcohol as etiology.  However given patient's epigastric pain, right upper quadrant ultrasound was performed to exclude biliary pathology, which is unremarkable.  Patient advised to cut down on his drinking, prescription refill for Nexium.  After the discussed management above, the patient was determined to be safe for discharge.   The patient was in agreement with this plan and all questions regarding their care were answered.  ED return precautions were discussed and the patient will return to the ED with any significant worsening of condition.  Barth Kirks. Sedonia Small, [place] [email or web address]  Final Clinical Impressions(s) / ED Diagnoses     ICD-10-CM   1. Epigastric pain  R10.13 DG ABD ACUTE 2+V W 1V CHEST    DG ABD ACUTE 2+V W 1V CHEST    US Abdomen Limited RUQ    US Abdomen Limited RUQ    ED Discharge Orders         Ordered    esomeprazole (NEXIUM) 40 MG capsule  Daily     12/26/18 1205             Maudie Flakes, MD 12/26/18 1207

## 2018-12-26 NOTE — Discharge Instructions (Addendum)
You were evaluated in the Emergency Department and after careful evaluation, we did not find any emergent condition requiring admission or further testing in the hospital.  Your symptoms today seem to be due to pain related to acid reflux.  Your labs and ultrasound today were otherwise reassuring.  We have provided you with a prescription for refill your Nexium, please take as directed.  It would also be beneficial for you to cut down on your alcohol, which can cause irritation to the stomach lining.  Please return to the Emergency Department if you experience any worsening of your condition.  We encourage you to follow up with a primary care provider.  Thank you for allowing [place] to be a part of your care.

## 2019-07-07 DIAGNOSIS — I639 Cerebral infarction, unspecified: Secondary | ICD-10-CM

## 2019-07-07 HISTORY — DX: Cerebral infarction, unspecified: I63.9

## 2019-08-25 ENCOUNTER — Other Ambulatory Visit: Payer: Self-pay

## 2019-08-25 ENCOUNTER — Ambulatory Visit (INDEPENDENT_AMBULATORY_CARE_PROVIDER_SITE_OTHER): Payer: Self-pay

## 2019-08-25 ENCOUNTER — Ambulatory Visit
Admission: EM | Admit: 2019-08-25 | Discharge: 2019-08-25 | Disposition: A | Discharge status: Home / Self Care | Payer: Self-pay

## 2019-08-25 DIAGNOSIS — S4991XA Unspecified injury of right shoulder and upper arm, initial encounter: Secondary | ICD-10-CM

## 2019-08-25 DIAGNOSIS — M25511 Pain in right shoulder: Secondary | ICD-10-CM

## 2019-08-25 MED ORDER — NAPROXEN 500 MG PO TABS: 500.0000 mg | ORAL_TABLET | Freq: Two times a day (BID) | ORAL | 0 refills | Status: DC

## 2019-08-25 NOTE — Discharge Instructions (Signed)
X-rays negative for fracture or dislocation Continue conservative management of rest, ice, elevation, and gentle stretches Take naproxen as needed for pain relief (may cause abdominal discomfort, ulcers, and GI bleeds avoid taking with other NSAIDs) Follow up with PCP or orthopedist if symptoms persist Return or go to the ER if you have any new or worsening symptoms (fever, chills, chest pain, shortness of breath, swelling, bruising, redness, etc...)

## 2019-08-25 NOTE — ED Provider Notes (Signed)
[place]   ID:[id number] 08/25/19 Arrival Time: [id number]  CC: RT shoulder injury  SUBJECTIVE: History from: patient. Gabriel Koch is a 54 y.o. male complains of RT shoulder injury that began 1 day ago.  Slipped and fell while walking on ice yesterday, landing on RT shoulder.  Localizes the pain to the top of RT shoulder.  Describes the pain as constant and sore in character.  Pain is 7/10.  Has tried OTC medications without relief.  Symptoms are made worse with ROM.  Denies similar symptoms in the past.  Denies fever, chills, erythema, ecchymosis, effusion, numbness and tingling.  ROS: As per HPI.  All other pertinent ROS negative.     Past Medical History:  Diagnosis Date  . Anxiety and depression   . Blood transfusion   . GERD (gastroesophageal reflux disease)   . Hypertension    Past Surgical History:  Procedure Laterality Date  . APPENDECTOMY    . TONSILLECTOMY     Allergies  Allergen Reactions  . Bee Venom    No current facility-administered medications on file prior to encounter.   Current Outpatient Medications on File Prior to Encounter  Medication Sig Dispense Refill  . acetaminophen (TYLENOL) 325 MG tablet Take 650 mg by mouth every 6 (six) hours as needed.    Marland Kitchen esomeprazole (NEXIUM) 40 MG capsule Take 1 capsule (40 mg total) by mouth daily. 30 capsule 1   Social History   Socioeconomic History  . Marital status: Legally Separated    Spouse name: Not on file  . Number of children: Not on file  . Years of education: Not on file  . Highest education level: Not on file  Occupational History  . Not on file  Tobacco Use  . Smoking status: Current Every Day Smoker    Packs/day: 1.00    Years: 35.00    Pack years: 35.00    Types: Cigarettes  . Smokeless tobacco: Never Used  Substance and Sexual Activity  . Alcohol use: Yes    Alcohol/week: 6.0 standard drinks    Types: 6 Cans of beer per week    Comment: daily  . Drug use: No  . Sexual  activity: Not on file  Other Topics Concern  . Not on file  Social History Narrative  . Not on file   Social Determinants of Health   Financial Resource Strain:   . Difficulty of Paying Living Expenses: Not on file  Food Insecurity:   . Worried About Charity fundraiser in the Last Year: Not on file  . Ran Out of Food in the Last Year: Not on file  Transportation Needs:   . Lack of Transportation (Medical): Not on file  . Lack of Transportation (Non-Medical): Not on file  Physical Activity:   . Days of Exercise per Week: Not on file  . Minutes of Exercise per Session: Not on file  Stress:   . Feeling of Stress : Not on file  Social Connections:   . Frequency of Communication with Friends and Family: Not on file  . Frequency of Social Gatherings with Friends and Family: Not on file  . Attends Religious Services: Not on file  . Active Member of Clubs or Organizations: Not on file  . Attends Archivist Meetings: Not on file  . Marital Status: Not on file  Intimate Partner Violence:   . Fear of Current or Ex-Partner: Not on file  . Emotionally Abused: Not on file  .  Physically Abused: Not on file  . Sexually Abused: Not on file   Family History  Problem Relation Age of Onset  . Healthy Mother   . Healthy Father     OBJECTIVE:  Vitals:   08/25/19 1046  BP: (!) 158/91  Pulse: (!) 105  Resp: 16  Temp: 97.8 F (36.6 C)  TempSrc: Oral  SpO2: 96%    General appearance: ALERT; in no acute distress.  Head: NCAT Lungs: Normal respiratory effort; CTAB CV: RRR Musculoskeletal: RT shoulder Inspection: Skin warm, dry, clear and intact without obvious erythema, effusion, or ecchymosis.  Palpation: Diffusely TTP over [facility] joint, posterior and lateral aspect of shoulder ROM: LROM about the RT shoulder Strength: 4+/5 shld abduction, 4+/5 shld adduction, 5/5 elbow flexion, 5/5 elbow extension Skin: warm and dry Neurologic: Ambulates without difficulty; Sensation intact  about the upper extremities Psychological: alert and cooperative; normal mood and affect  DIAGNOSTIC STUDIES:  DG Shoulder Right  Result Date: 08/25/2019 CLINICAL DATA:  Pain post fall EXAM: RIGHT SHOULDER - 2+ VIEW COMPARISON:  None. FINDINGS: No fracture or dislocation. Normal alignment and mineralization. No significant osseous degenerative change. IMPRESSION: Negative. Electronically Signed   By: Lucrezia Europe M.D.   On: 08/25/2019 11:00     X-rays negative for bony abnormalities including fracture, or dislocation.  No soft tissue swelling.    I have reviewed the x-rays myself and the radiologist interpretation. I am in agreement with the radiologist interpretation.     ASSESSMENT & PLAN:  1. Acute pain of right shoulder   2. Injury of right shoulder, initial encounter     Meds ordered this encounter  Medications  . naproxen (NAPROSYN) 500 MG tablet    Sig: Take 1 tablet (500 mg total) by mouth 2 (two) times daily.    Dispense:  30 tablet    Refill:  0    Order Specific Question:   Supervising Provider    Answer:   Raylene Everts [id number]   X-rays negative for fracture or dislocation Continue conservative management of rest, ice, elevation, and gentle stretches Take naproxen as needed for pain relief (may cause abdominal discomfort, ulcers, and GI bleeds avoid taking with other NSAIDs) Follow up with PCP or orthopedist if symptoms persist Return or go to the ER if you have any new or worsening symptoms (fever, chills, chest pain, shortness of breath, swelling, bruising, redness, etc...)   Reviewed expectations re: course of current medical issues. Questions answered. Outlined signs and symptoms indicating need for more acute intervention. Patient verbalized understanding. After Visit Summary given.    Lestine Box, PA-C 08/25/19 1118

## 2019-08-25 NOTE — ED Triage Notes (Signed)
Pt presents to UC w/ c/o right shoulder injury yesterday. Pt states he slipped on ice on his porch yesterday.  Pt is able to lift arm above head. No bruising or swelling noted.

## 2019-12-11 ENCOUNTER — Encounter: Payer: Self-pay | Admitting: Internal Medicine

## 2019-12-15 ENCOUNTER — Telehealth: Payer: Self-pay

## 2019-12-15 NOTE — Telephone Encounter (Signed)
Pt called and has an upcoming appointment with our office 01/2020. Pt doesn't have insurance and will need to apply for [place] assistance. Please mail pt a form. Verified mailing address and it's correct in our system.

## 2019-12-15 NOTE — Telephone Encounter (Signed)
Mailed patient [place] along with appt card

## 2020-01-18 ENCOUNTER — Encounter: Payer: Self-pay | Admitting: Gastroenterology

## 2020-01-18 NOTE — Progress Notes (Addendum)
Referring Provider: Redmond School, MD Primary Care Physician:  Redmond School, MD Primary Gastroenterologist:  Dr. Oneida Alar historically; will establish with Dr. Abbey Chatters; Dr. Gala Romney following for now  Chief Complaint  Patient presents with  . Emesis    early AM, has sensation of dry throat, gagging. can have about 2-3 episodes like this in the mornings  . Rectal Bleeding    with BM at times, can be bright red in color, c/o hemorrhoids. No TCS done prior    HPI:   Gabriel Koch is a 54 y.o. male presenting today at the request of Redmond School, MD for vomiting.   Reviewed PCP office visit dated 12/05/2019.  Patient was presenting with diffuse abdominal pain, moderate in severity.  GERD symptoms controlled.  Also reporting nausea, vomiting, and blood in the stool secondary to hemorrhoids.  Etiology of abdominal pain was not clear.  Advised to discontinue alcohol, update labs. Labs completed 12/06/2019.  WBC, hemoglobin, platelets within normal limits.  CMP remarkable for potassium low at 2.3, otherwise within normal limits. Amylase 43.  Today:  Emesis: Started a few years ago. Occurs early in the morning. Wakes up feeling like he is choking, mouth is dry, he starts coughing, then vomits. Sometimes, vomiting is not preceded by coughing. Denies morning nausea. States he feels his throat is feeling up at times. Not sure if this is reflux. More recently, episodes had become more frequent.  Last week, vomiting was every morning. Yellow to green. Denies hematemesis. Typically, he is vomiting small amounts and does not bring up any food. During the day, he feels fine. Started taking Nexium 20 mg every morning which keeps GERD symptoms well controlled. Since taking Nexium every day, he feels his symptoms have improved somewhat. Sunday-Wednesday this week, he had no vomiting.occasional upper abdominal pain but not regularly. Eating within 3 hours of going to bed. Hasn't paid attention as to whether this  is followed my morning vomiting. Has been told he snores when he sleeps. Sleeps with 2 pillows.  No dysphagia. No black stool. No NSAIDs typically. Usually takes Tylenol.   Drinks bootleggers, sometimes 4 a day.    Reports he has been out of work since March 2021 and he is stressed over this. Lives with his aunt. Doesn't have a license. Thinks a lot about his Ex-wife. Also reports PTSD.   BMs range from [facility] 1-4. Occasionally [place] 7. Occasional BRBPR when having a BM. Not routinely. Often follows strenuous work. 4 BMs today.Usually just 1-2 BMs daily.  Blood will be on toilet tissue. Occasionally on stool and in water. Started about 1-2 years ago. No hemorrhoids on the outside. No rectal pain. Some straining with stools at times.  Reports having an EGD in the [facility]s at [facility] with ulcers.   Has a lot of knee pain. Asking for refill of pain medication.   BP not usually this high. Denies headache, blurry vision, CP, or SOB. May be because of pain and anxiety with coming to the appointment today. BP was 132/62 when he saw his PCP. Prefers not to go to the ED.   Now taking potassium daily.    EGD for barretts esophagus screening.   Past Medical History:  Diagnosis Date  . Alcohol abuse   . Anxiety and depression   . Blood transfusion   . GERD (gastroesophageal reflux disease)   . Hypertension     Past Surgical History:  Procedure Laterality Date  . APPENDECTOMY    . TONSILLECTOMY  Current Outpatient Medications  Medication Sig Dispense Refill  . acetaminophen (TYLENOL) 325 MG tablet Take 650 mg by mouth every 6 (six) hours as needed.    . Alum & Mag Hydroxide-Simeth (MYLANTA PO) Take by mouth as needed.    Marland Kitchen amLODipine (NORVASC) 5 MG tablet Take 5 mg by mouth daily.    . busPIRone (BUSPAR) 10 MG tablet Take 10 mg by mouth 2 (two) times daily.    Marland Kitchen HYDROcodone-acetaminophen (NORCO) 10-325 MG tablet Take 1 tablet by mouth 4 (four) times daily as needed.    . Menthol,  Topical Analgesic, (ICY HOT EX) Apply topically as needed.    . potassium chloride SA (KLOR-CON) 20 MEQ tablet Take 20 mEq by mouth in the morning, at noon, in the evening, and at bedtime. Taking daily.    Marland Kitchen tiZANidine (ZANAFLEX) 4 MG tablet Take 4 mg by mouth daily.    . traZODone (DESYREL) 100 MG tablet Take 100 mg by mouth 2 (two) times daily.    Marland Kitchen trolamine salicylate (ASPERCREME) 10 % cream Apply 1 application topically as needed for muscle pain.    Marland Kitchen esomeprazole (NEXIUM) 20 MG capsule Take 1 capsule (20 mg total) by mouth in the morning and at bedtime. 60 capsule 3   No current facility-administered medications for this visit.    Allergies as of 01/19/2020 - Review Complete 01/19/2020  Allergen Reaction Noted  . Bee venom  09/09/2011    Family History  Problem Relation Age of Onset  . Colon cancer Neg Hx        Pt knows little about family history    Social History   Socioeconomic History  . Marital status: Legally Separated    Spouse name: Not on file  . Number of children: Not on file  . Years of education: Not on file  . Highest education level: Not on file  Occupational History  . Not on file  Tobacco Use  . Smoking status: Current Every Day Smoker    Packs/day: 1.00    Years: 35.00    Pack years: 35.00    Types: Cigarettes  . Smokeless tobacco: Never Used  Substance and Sexual Activity  . Alcohol use: Yes    Alcohol/week: 6.0 standard drinks    Types: 6 Cans of beer per week    Comment: daily  . Drug use: No  . Sexual activity: Not on file  Other Topics Concern  . Not on file  Social History Narrative  . Not on file   Social Determinants of Health   Financial Resource Strain:   . Difficulty of Paying Living Expenses:   Food Insecurity:   . Worried About Charity fundraiser in the Last Year:   . Arboriculturist in the Last Year:   Transportation Needs:   . Film/video editor (Medical):   Marland Kitchen Lack of Transportation (Non-Medical):   Physical  Activity:   . Days of Exercise per Week:   . Minutes of Exercise per Session:   Stress:   . Feeling of Stress :   Social Connections:   . Frequency of Communication with Friends and Family:   . Frequency of Social Gatherings with Friends and Family:   . Attends Religious Services:   . Active Member of Clubs or Organizations:   . Attends Archivist Meetings:   Marland Kitchen Marital Status:   Intimate Partner Violence:   . Fear of Current or Ex-Partner:   . Emotionally Abused:   .  Physically Abused:   . Sexually Abused:     Review of Systems: Gen: Denies any fever, chills, lightheadedness, dizziness, presyncope, syncope..  CV: Denies chest pain or heart palpitations. Resp: Denies shortness of breath or cough. GI: See HPI GU : Denies urinary burning, urinary frequency, urinary hesitancy MS: Chronic knee pain Derm: Denies rash Psych: Admits to anxiety Heme: See HPI  Physical Exam: BP (!) 169/105   Pulse (!) 119   Temp (!) 97.1 F (36.2 C)   Ht '5\' 6"'  (1.676 m)   Wt 155 lb 12.8 oz (70.7 kg)   BMI 25.15 kg/m  General:   Alert and oriented. Pleasant and cooperative. Well-nourished and well-developed.  Head:  Normocephalic and atraumatic. Eyes:  Without icterus. Injected sclera.   Ears:  Normal auditory acuity. Lungs:  Clear to auscultation bilaterally. No wheezes, rales, or rhonchi. No distress.  Heart:  S1, S2 present without murmurs appreciated. Pulse during my exam 95.  Abdomen:  +BS, soft, non-tender and non-distended. No HSM noted. No guarding or rebound. No masses appreciated.  Rectal:  Deferred  Msk:  Symmetrical without gross deformities. Normal posture. Extremities:  Without edema. Neurologic:  Alert and  oriented x4;  grossly normal neurologically. Skin:  Intact without significant lesions or rashes. Psych:  Normal mood and affect.  Labs: 12/06/2019: Amylase 43 CBC: WBC 5.6, hemoglobin 14.3, MCV 119 (H), MCHC 36.7 (H), MCH 43.5 (H), platelets 215 CMP: Glucose  93, creatinine 0.96, sodium 139, potassium 2.3, chloride 95, calcium 8.8, albumin 4.0, total bilirubin 1.1, alk phos 107, AST 40, ALT 27

## 2020-01-19 ENCOUNTER — Encounter: Payer: Self-pay | Admitting: Gastroenterology

## 2020-01-19 ENCOUNTER — Ambulatory Visit (INDEPENDENT_AMBULATORY_CARE_PROVIDER_SITE_OTHER): Payer: Self-pay | Admitting: Gastroenterology

## 2020-01-19 ENCOUNTER — Other Ambulatory Visit: Payer: Self-pay

## 2020-01-19 DIAGNOSIS — K625 Hemorrhage of anus and rectum: Secondary | ICD-10-CM

## 2020-01-19 DIAGNOSIS — R1110 Vomiting, unspecified: Secondary | ICD-10-CM | POA: Insufficient documentation

## 2020-01-19 DIAGNOSIS — R198 Other specified symptoms and signs involving the digestive system and abdomen: Secondary | ICD-10-CM | POA: Insufficient documentation

## 2020-01-19 DIAGNOSIS — I159 Secondary hypertension, unspecified: Secondary | ICD-10-CM

## 2020-01-19 DIAGNOSIS — R1111 Vomiting without nausea: Secondary | ICD-10-CM

## 2020-01-19 DIAGNOSIS — K219 Gastro-esophageal reflux disease without esophagitis: Secondary | ICD-10-CM | POA: Insufficient documentation

## 2020-01-19 MED ORDER — ESOMEPRAZOLE MAGNESIUM 20 MG PO CPDR: 20.0000 mg | DELAYED_RELEASE_CAPSULE | Freq: Two times a day (BID) | ORAL | 3 refills | Status: DC

## 2020-01-19 NOTE — Patient Instructions (Addendum)
We will get you scheduled for a colonoscopy and upper endoscopy in the near future.  Please call your primary care provider today to discuss your elevated blood pressure. Your blood pressure was 169/105 today. If you are not able to get ahold of your primary care provider, I recommend you proceed to the emergency room to get your blood pressure down.  If you develop any chest pain, heart palpitations, feeling short of breath, dizzy, headaches, blurry vision, you should also proceed to the emergency room.  Your blood pressure will need to be under better control in order for you to have procedures.  Please increase Nexium to 20 mg twice daily 30 minutes before breakfast and dinner.  I am sending a prescription to your pharmacy.  Do not eat within 3 hours of laying down.  Prop the head of your bed up on wood or bricks to create a 6 inch incline.  Follow a GERD diet:  Avoid fried, fatty, greasy, spicy, citrus foods. Avoid caffeine and carbonated beverages. Avoid chocolate. Try eating 4-6 small meals a day rather than 3 large meals.  For alternating constipation and diarrhea, add Benefiber or Metamucil daily. Drink enough water to keep your urine pale yellow to clear.  We will reach out to you in 2 weeks to see if you have already follow-up with your primary care regarding your elevated blood pressure.  We will also check with you on how your symptoms are doing with increase Nexium.  If you have not heard from our office, please give [place] a call.  We will plan to follow-up with you in the office after your procedures.  Aliene Altes, PA-C [facility]    Food Choices for Gastroesophageal Reflux Disease, Adult When you have gastroesophageal reflux disease (GERD), the foods you eat and your eating habits are very important. Choosing the right foods can help ease your discomfort. Think about working with a nutrition specialist (dietitian) to help you make good choices. What are  tips for following this plan?  Meals  Choose healthy foods that are low in fat, such as fruits, vegetables, whole grains, low-fat dairy products, and lean meat, fish, and poultry.  Eat small meals often instead of 3 large meals a day. Eat your meals slowly, and in a place where you are relaxed. Avoid bending over or lying down until 2-3 hours after eating.  Avoid eating meals 2-3 hours before bed.  Avoid drinking a lot of liquid with meals.  Cook foods using methods other than frying. Bake, grill, or broil food instead.  Avoid or limit: ? Chocolate. ? Peppermint or spearmint. ? Alcohol. ? Pepper. ? Black and decaffeinated coffee. ? Black and decaffeinated tea. ? Bubbly (carbonated) soft drinks. ? Caffeinated energy drinks and soft drinks.  Limit high-fat foods such as: ? Fatty meat or fried foods. ? Whole milk, cream, butter, or ice cream. ? Nuts and nut butters. ? Pastries, donuts, and sweets made with butter or shortening.  Avoid foods that cause symptoms. These foods may be different for everyone. Common foods that cause symptoms include: ? Tomatoes. ? Oranges, lemons, and limes. ? Peppers. ? Spicy food. ? Onions and garlic. ? Vinegar. Lifestyle  Maintain a healthy weight. Ask your doctor what weight is healthy for you. If you need to lose weight, work with your doctor to do so safely.  Exercise for at least 30 minutes for 5 or more days each week, or as told by your doctor.  Wear loose-fitting clothes.  Do not smoke. If you need help quitting, ask your doctor.  Sleep with the head of your bed higher than your feet. Use a wedge under the mattress or blocks under the bed frame to raise the head of the bed. Summary  When you have gastroesophageal reflux disease (GERD), food and lifestyle choices are very important in easing your symptoms.  Eat small meals often instead of 3 large meals a day. Eat your meals slowly, and in a place where you are relaxed.  Limit  high-fat foods such as fatty meat or fried foods.  Avoid bending over or lying down until 2-3 hours after eating.  Avoid peppermint and spearmint, caffeine, alcohol, and chocolate. This information is not intended to replace advice given to you by your health care provider. Make sure you discuss any questions you have with your health care provider. Document Revised: 10/13/2018 Document Reviewed: 07/28/2016 Elsevier Patient Education  [place].

## 2020-01-20 ENCOUNTER — Encounter: Payer: Self-pay | Admitting: Gastroenterology

## 2020-01-20 DIAGNOSIS — I10 Essential (primary) hypertension: Secondary | ICD-10-CM | POA: Insufficient documentation

## 2020-01-20 NOTE — Assessment & Plan Note (Signed)
54 year old male with chronic history of GERD.  Typical GERD symptoms seem to be well controlled on Nexium 20 mg daily.  However, patient reports intermittent vomiting when waking in the morning and feels his "throat is filling up" at times.  Query whether nocturnal GERD is contributing to morning vomiting as patient feels well throughout the day.  No significant abdominal pain.  Denies dysphagia, hematemesis, melena, or regular NSAID use.  Admits to drinking alcohol daily and eating within 3 hours of going to bed.  Plan: Increase Nexium to 20 mg twice daily 30 minutes before breakfast and dinner.  Prescription sent to pharmacy. Follow a GERD diet/lifestyle:   Avoid fried, fatty, greasy, spicy, citrus foods.  Avoid caffeine and carbonated beverages.  Avoid chocolate.  Try eating 4-6 small meals a day rather than 3 large meals.  Do not eat within 3 hours of laying down.  Prop head of bed up on wood or bricks to create a 6 inch incline. Counseled the importance of working toward abstinence of alcohol. Plan to proceed with EGD with propofol with Dr. Abbey Chatters in the near future for Barrett's esophagus screening due to being a male, age 87, and chronic GERD. The risks, benefits, and alternatives have been discussed in detail with patient. They have stated understanding and desire to proceed.  ASA III (alcohol abuse) *Of note, patient has significantly elevated blood pressure today.  Prefers not to go to the ED.  Advised to call his PCP today.  If unable to contact him, he was advised to go to the emergency room.  He was also given additional alarm symptoms to watch for in the future that would require ED evaluation.  We will call patient in 2 weeks to follow-up with him on blood pressure as this is going to need to be under better control for upcoming procedures.

## 2020-01-20 NOTE — Assessment & Plan Note (Signed)
54 year old male with 1-2-year history of intermittent bright red blood per rectum, typically following strenuous work.  Blood typically on toilet tissue, occasionally on stool or in water.  No known hemorrhoids but suspects this may be the cause.  No rectal pain.  BMs typically range from [facility] 1-4, occasionally [place] 7.  Occasional straining with BMs.  No prior colonoscopy.  Does not know family medical history.  Suspect rectal bleeding is likely from benign anorectal source such as hemorrhoids.  However, cannot rule out colon polyps or malignancy.  Plan: Proceed with TCS with propofol with Dr. Abbey Chatters in the near future. The risks, benefits, and alternatives have been discussed in detail with patient. They have stated understanding and desire to proceed.  ASA III (alcohol abuse) *Of note, patient has significantly elevated blood pressure today.  Prefers not to go to the ED.  Advised to call his PCP today.  If unable to contact him, he was advised to go to the emergency room.  He was also given additional alarm symptoms to watch for in the future that would require ED evaluation.  We will call patient in 2 weeks to follow-up with him on blood pressure as this is going to need to be under better control for upcoming procedures.

## 2020-01-20 NOTE — Assessment & Plan Note (Addendum)
54 year old male reporting 1-2-year history of low volume morning emesis that has been increasing in frequency more recently.  Denies waking up nauseated.  States he wakes up choking, has dry mouth, starts coughing, then vomits.  Also reports feeling his throat is "feeling up" at times.  After morning symptoms, he feels well throughout the day. Denies hematemesis.  Typical GERD symptoms are well controlled on Nexium 20 mg daily.  Also notes since starting Nexium daily, he feels frequency of vomiting is improving.  Denies any significant abdominal pain.  Denies melena or regular NSAID use.  He does drink alcohol daily and admits to eating within 3 hours of laying down.  Suspect nocturnal GERD is contributing to early morning vomiting.  Alcohol likely contributing.  Plan: Increase Nexium to 20 mg twice daily 30 minutes before breakfast and dinner. Follow a GERD diet/lifestyle:   Avoid fried, fatty, greasy, spicy, citrus foods.  Avoid caffeine and carbonated beverages.  Avoid chocolate.  Try eating 4-6 small meals a day rather than 3 large meals.  Do not eat within 3 hours of laying down.  Prop head of bed up on wood or bricks to create a 6 inch incline. Counseled the importance of working toward abstinence of alcohol. Plan to proceed with EGD with propofol with Dr. Abbey Chatters in the near future for Barrett's esophagus screening due to being a male, age 17, and chronic GERD. The risks, benefits, and alternatives have been discussed in detail with patient. They have stated understanding and desire to proceed.  ASA III (alcohol abuse) *Of note, patient has significantly elevated blood pressure today.  Prefers not to go to the ED.  Advised to call his PCP today.  If unable to contact him, he was advised to go to the emergency room.  He was also given additional alarm symptoms to watch for in the future that would require ED evaluation.  We will call patient in 2 weeks to follow-up with him on blood pressure as  this is going to need to be under better control for upcoming procedures.

## 2020-01-20 NOTE — Assessment & Plan Note (Addendum)
Alternating constipation and diarrhea.  BMs range from [facility] 1-4 with occasional [place] 7 stools.  Typically 1-2 BMs daily.  Today he has had 4 loose BMs.  Also with 1-2-year history of intermittent bright red blood per rectum that is typically following strenuous work.  No known hemorrhoids or rectal pain.  No prior colonoscopy.   I suspect alternating bowel habits may very well be related to diet and chronic alcohol use.    Plan: Add Benefiber or Metamucil daily. Drink enough water to keep urine pale yellow to clear. Counseled on the importance of working towards abstinence of alcohol. Proceed with TCS with propofol with Dr. Abbey Chatters in the near future. The risks, benefits, and alternatives have been discussed in detail with patient. They have stated understanding and desire to proceed.  ASA III (alcohol abuse) *Of note, patient has significantly elevated blood pressure today.  Prefers not to go to the ED.  Advised to call his PCP today.  If unable to contact him, he was advised to go to the emergency room.  He was also given additional alarm symptoms to watch for in the future that would require ED evaluation.  We will call patient in 2 weeks to follow-up with him on blood pressure as this is going to need to be under better control for upcoming procedures.

## 2020-01-20 NOTE — Assessment & Plan Note (Signed)
BP 169/105. Not on any BP medications. BP was 132/62 at OV with PCP on 12/05/19. Patient reports BP is not usually this high. He is struggling with knee pain and anxious about today's visit. Asymptomatic related to HTN. Prefers not to go to the ED.   Advised to call his PCP today.  If unable to contact him, he was advised to go to the emergency room.  He was also given additional alarm symptoms to watch for in the future that would require ED evaluation.  We will call patient in 2 weeks to follow-up with him on blood pressure as this is going to need to be under better control for upcoming procedures.

## 2020-02-12 ENCOUNTER — Telehealth: Payer: Self-pay | Admitting: *Deleted

## 2020-02-12 NOTE — Telephone Encounter (Signed)
LMOVM for pt. Needs to schedule TCS/EGD with propofol with Dr. Abbey Chatters ASA 3

## 2020-02-13 ENCOUNTER — Telehealth: Payer: Self-pay

## 2020-02-13 ENCOUNTER — Encounter: Payer: Self-pay | Admitting: *Deleted

## 2020-02-13 NOTE — Telephone Encounter (Signed)
Called pt. He wants to know if his pre-op/covid can be done day prior to procedure. Called endo and LMOVM

## 2020-02-13 NOTE — Telephone Encounter (Signed)
Left detailed message on named VM with new covid test time. Pre-op time will stay the same. Letter also mailed

## 2020-02-13 NOTE — Telephone Encounter (Signed)
Patient returned call. He is scheduled for his procedure 9/28 at 8:30am. Patient aware will need pre-op/covid test prior. Advised will mail this to him with his miralax prep instructions. Confirmed mailing address.

## 2020-02-13 NOTE — Telephone Encounter (Signed)
Please call pt back. Pt wants to discuss the length of time he is suppose to [place] prior to his procedure. I discussed with pt per the letters that will be mailed to pt. Pt states he won't be able not to work for the length of time and wants to discuss possibly changing apt.

## 2020-02-15 ENCOUNTER — Telehealth: Payer: Self-pay | Admitting: Internal Medicine

## 2020-02-15 NOTE — Telephone Encounter (Signed)
[place] for pt. Provided endo's # to call for pre-op/covid test.

## 2020-02-15 NOTE — Telephone Encounter (Signed)
Pt called to let us know that he had his first covid vaccine and will get his second shot on 9/9. He wanted to confirm his preop date was 9/24, his covid test is 9/27 and his procedure is 9/28 with Dr Abbey Chatters. He asked if he could combine the 2 preop and covid test to save him a trip into town . [phone]

## 2020-03-25 NOTE — Patient Instructions (Signed)
JAYZEN PAVER  03/25/2020     @PREFPERIOPPHARMACY @   Your procedure is scheduled on  04/02/2020.  Report to [name] at  0700  A.M.  Call this number if you have problems the morning of surgery:  [phone]   Remember:  Follow the diet and prep instructions given to you by the office.                      Take these medicines the morning of surgery with A SIP OF WATER  Amlodipine, buspar, nexium, hydrocodone(if needed), zanaflex(if needed) trazodone.    Do not wear jewelry, make-up or nail polish.  Do not wear lotions, powders, or perfumes. Please wear deodorant and brush your teeth.  Do not shave 48 hours prior to surgery.  Men may shave face and neck.  Do not bring valuables to the hospital.  [facility] is not responsible for any belongings or valuables.  Contacts, dentures or bridgework may not be worn into surgery.  Leave your suitcase in the car.  After surgery it may be brought to your room.  For patients admitted to the hospital, discharge time will be determined by your treatment team.  Patients discharged the day of surgery will not be allowed to drive home.   Name and phone number of your driver:   family Special instructions:  DO NOT smoke the day of your procedure.  Please read over the following fact sheets that you were given. Anesthesia Post-op Instructions and Care and Recovery After Surgery       Upper Endoscopy, Adult, Care After This sheet gives you information about how to care for yourself after your procedure. Your health care provider may also give you more specific instructions. If you have problems or questions, contact your health care provider. What can I expect after the procedure? After the procedure, it is common to have:  A sore throat.  Mild stomach pain or discomfort.  Bloating.  Nausea. Follow these instructions at home:   Follow instructions from your health care provider about what to eat or drink after  your procedure.  Return to your normal activities as told by your health care provider. Ask your health care provider what activities are safe for you.  Take over-the-counter and prescription medicines only as told by your health care provider.  Do not drive for 24 hours if you were given a sedative during your procedure.  Keep all follow-up visits as told by your health care provider. This is important. Contact a health care provider if you have:  A sore throat that lasts longer than one day.  Trouble swallowing. Get help right away if:  You vomit blood or your vomit looks like coffee grounds.  You have: ? A fever. ? Bloody, black, or tarry stools. ? A severe sore throat or you cannot swallow. ? Difficulty breathing. ? Severe pain in your chest or abdomen. Summary  After the procedure, it is common to have a sore throat, mild stomach discomfort, bloating, and nausea.  Do not drive for 24 hours if you were given a sedative during the procedure.  Follow instructions from your health care provider about what to eat or drink after your procedure.  Return to your normal activities as told by your health care provider. This information is not intended to replace advice given to you by your health care provider. Make sure you discuss any questions you  have with your health care provider. Document Revised: 12/14/2017 Document Reviewed: 11/22/2017 Elsevier Patient Education  [place].  Colonoscopy, Adult, Care After This sheet gives you information about how to care for yourself after your procedure. Your health care provider may also give you more specific instructions. If you have problems or questions, contact your health care provider. What can I expect after the procedure? After the procedure, it is common to have:  A small amount of blood in your stool for 24 hours after the procedure.  Some gas.  Mild cramping or bloating of your abdomen. Follow these  instructions at home: Eating and drinking   Drink enough fluid to keep your urine pale yellow.  Follow instructions from your health care provider about eating or drinking restrictions.  Resume your normal diet as instructed by your health care provider. Avoid heavy or fried foods that are hard to digest. Activity  Rest as told by your health care provider.  Avoid sitting for a long time without moving. Get up to take short walks every 1-2 hours. This is important to improve blood flow and breathing. Ask for help if you feel weak or unsteady.  Return to your normal activities as told by your health care provider. Ask your health care provider what activities are safe for you. Managing cramping and bloating   Try walking around when you have cramps or feel bloated.  Apply heat to your abdomen as told by your health care provider. Use the heat source that your health care provider recommends, such as a moist heat pack or a heating pad. ? Place a towel between your skin and the heat source. ? Leave the heat on for 20-30 minutes. ? Remove the heat if your skin turns bright red. This is especially important if you are unable to feel pain, heat, or cold. You may have a greater risk of getting burned. General instructions  For the first 24 hours after the procedure: ? Do not drive or use machinery. ? Do not sign important documents. ? Do not drink alcohol. ? Do your regular daily activities at a slower pace than normal. ? Eat soft foods that are easy to digest.  Take over-the-counter and prescription medicines only as told by your health care provider.  Keep all follow-up visits as told by your health care provider. This is important. Contact a health care provider if:  You have blood in your stool 2-3 days after the procedure. Get help right away if you have:  More than a small spotting of blood in your stool.  Large blood clots in your stool.  Swelling of your  abdomen.  Nausea or vomiting.  A fever.  Increasing pain in your abdomen that is not relieved with medicine. Summary  After the procedure, it is common to have a small amount of blood in your stool. You may also have mild cramping and bloating of your abdomen.  For the first 24 hours after the procedure, do not drive or use machinery, sign important documents, or drink alcohol.  Get help right away if you have a lot of blood in your stool, nausea or vomiting, a fever, or increased pain in your abdomen. This information is not intended to replace advice given to you by your health care provider. Make sure you discuss any questions you have with your health care provider. Document Revised: 01/16/2019 Document Reviewed: 01/16/2019 Elsevier Patient Education  [place] After These instructions provide  you with information about caring for yourself after your procedure. Your health care provider may also give you more specific instructions. Your treatment has been planned according to current medical practices, but problems sometimes occur. Call your health care provider if you have any problems or questions after your procedure. What can I expect after the procedure? After your procedure, you may:  Feel sleepy for several hours.  Feel clumsy and have poor balance for several hours.  Feel forgetful about what happened after the procedure.  Have poor judgment for several hours.  Feel nauseous or vomit.  Have a sore throat if you had a breathing tube during the procedure. Follow these instructions at home: For at least 24 hours after the procedure:      Have a responsible adult stay with you. It is important to have someone help care for you until you are awake and alert.  Rest as needed.  Do not: ? Participate in activities in which you could fall or become injured. ? Drive. ? Use heavy machinery. ? Drink alcohol. ? Take sleeping  pills or medicines that cause drowsiness. ? Make important decisions or sign legal documents. ? Take care of children on your own. Eating and drinking  Follow the diet that is recommended by your health care provider.  If you vomit, drink water, juice, or soup when you can drink without vomiting.  Make sure you have little or no nausea before eating solid foods. General instructions  Take over-the-counter and prescription medicines only as told by your health care provider.  If you have sleep apnea, surgery and certain medicines can increase your risk for breathing problems. Follow instructions from your health care provider about wearing your sleep device: ? Anytime you are sleeping, including during daytime naps. ? While taking prescription pain medicines, sleeping medicines, or medicines that make you drowsy.  If you smoke, do not smoke without supervision.  Keep all follow-up visits as told by your health care provider. This is important. Contact a health care provider if:  You keep feeling nauseous or you keep vomiting.  You feel light-headed.  You develop a rash.  You have a fever. Get help right away if:  You have trouble breathing. Summary  For several hours after your procedure, you may feel sleepy and have poor judgment.  Have a responsible adult stay with you for at least 24 hours or until you are awake and alert. This information is not intended to replace advice given to you by your health care provider. Make sure you discuss any questions you have with your health care provider. Document Revised: 09/20/2017 Document Reviewed: 10/13/2015 Elsevier Patient Education  [place].

## 2020-03-29 ENCOUNTER — Other Ambulatory Visit: Payer: Self-pay

## 2020-03-29 ENCOUNTER — Other Ambulatory Visit (HOSPITAL_COMMUNITY)
Admission: RE | Admit: 2020-03-29 | Discharge: 2020-03-29 | Disposition: A | Discharge status: Home / Self Care | Payer: HRSA Program | Source: Ambulatory Visit | Attending: Internal Medicine | Admitting: Internal Medicine

## 2020-03-29 ENCOUNTER — Encounter (HOSPITAL_COMMUNITY): Payer: Self-pay

## 2020-03-29 ENCOUNTER — Encounter (HOSPITAL_COMMUNITY)
Admission: RE | Admit: 2020-03-29 | Discharge: 2020-03-29 | Disposition: A | Discharge status: Home / Self Care | Payer: Self-pay | Source: Ambulatory Visit | Attending: Internal Medicine | Admitting: Internal Medicine

## 2020-03-29 ENCOUNTER — Other Ambulatory Visit (HOSPITAL_COMMUNITY): Payer: Medicaid Other

## 2020-03-29 DIAGNOSIS — Z20822 Contact with and (suspected) exposure to covid-19: Secondary | ICD-10-CM | POA: Insufficient documentation

## 2020-03-29 DIAGNOSIS — Z01812 Encounter for preprocedural laboratory examination: Secondary | ICD-10-CM | POA: Insufficient documentation

## 2020-03-29 DIAGNOSIS — Z01818 Encounter for other preprocedural examination: Secondary | ICD-10-CM | POA: Insufficient documentation

## 2020-03-29 HISTORY — DX: Other complications of anesthesia, initial encounter: T88.59XA

## 2020-03-29 LAB — CBC WITH DIFFERENTIAL/PLATELET
Abs Immature Granulocytes: 0.03 10*3/uL (ref 0.00–0.07)
Basophils Absolute: 0 10*3/uL (ref 0.0–0.1)
Basophils Relative: 1 %
Eosinophils Absolute: 0 10*3/uL (ref 0.0–0.5)
Eosinophils Relative: 1 %
HCT: 38.3 % — ABNORMAL LOW (ref 39.0–52.0)
Hemoglobin: 13.8 g/dL (ref 13.0–17.0)
Immature Granulocytes: 1 %
Lymphocytes Relative: 24 %
Lymphs Abs: 1.1 10*3/uL (ref 0.7–4.0)
MCH: 40 pg — ABNORMAL HIGH (ref 26.0–34.0)
MCHC: 36 g/dL (ref 30.0–36.0)
MCV: 111 fL — ABNORMAL HIGH (ref 80.0–100.0)
Monocytes Absolute: 0.6 10*3/uL (ref 0.1–1.0)
Monocytes Relative: 13 %
Neutro Abs: 2.7 10*3/uL (ref 1.7–7.7)
Neutrophils Relative %: 60 %
Platelets: 234 10*3/uL (ref 150–400)
RBC: 3.45 MIL/uL — ABNORMAL LOW (ref 4.22–5.81)
RDW: 12.3 % (ref 11.5–15.5)
WBC: 4.4 10*3/uL (ref 4.0–10.5)
nRBC: 0 % (ref 0.0–0.2)

## 2020-03-29 LAB — SARS CORONAVIRUS 2 (TAT 6-24 HRS): SARS Coronavirus 2: NEGATIVE

## 2020-04-01 ENCOUNTER — Other Ambulatory Visit (HOSPITAL_COMMUNITY): Admission: RE | Admit: 2020-04-01 | Payer: Medicaid Other | Source: Ambulatory Visit

## 2020-04-02 ENCOUNTER — Ambulatory Visit (HOSPITAL_COMMUNITY)
Admission: RE | Admit: 2020-04-02 | Discharge: 2020-04-02 | Disposition: A | Discharge status: Home / Self Care | Payer: Self-pay | Attending: Internal Medicine | Admitting: Internal Medicine

## 2020-04-02 ENCOUNTER — Ambulatory Visit (HOSPITAL_COMMUNITY): Payer: Self-pay | Admitting: Anesthesiology

## 2020-04-02 ENCOUNTER — Encounter (HOSPITAL_COMMUNITY)
Admission: RE | Disposition: A | Discharge status: Home / Self Care | Payer: Self-pay | Source: Home / Self Care | Attending: Internal Medicine

## 2020-04-02 DIAGNOSIS — Z91030 Bee allergy status: Secondary | ICD-10-CM | POA: Insufficient documentation

## 2020-04-02 DIAGNOSIS — K2270 Barrett's esophagus without dysplasia: Secondary | ICD-10-CM | POA: Insufficient documentation

## 2020-04-02 DIAGNOSIS — Z9049 Acquired absence of other specified parts of digestive tract: Secondary | ICD-10-CM | POA: Insufficient documentation

## 2020-04-02 DIAGNOSIS — K5730 Diverticulosis of large intestine without perforation or abscess without bleeding: Secondary | ICD-10-CM | POA: Insufficient documentation

## 2020-04-02 DIAGNOSIS — Z79899 Other long term (current) drug therapy: Secondary | ICD-10-CM | POA: Insufficient documentation

## 2020-04-02 DIAGNOSIS — K219 Gastro-esophageal reflux disease without esophagitis: Secondary | ICD-10-CM | POA: Insufficient documentation

## 2020-04-02 DIAGNOSIS — K449 Diaphragmatic hernia without obstruction or gangrene: Secondary | ICD-10-CM | POA: Insufficient documentation

## 2020-04-02 DIAGNOSIS — I10 Essential (primary) hypertension: Secondary | ICD-10-CM | POA: Insufficient documentation

## 2020-04-02 DIAGNOSIS — F17210 Nicotine dependence, cigarettes, uncomplicated: Secondary | ICD-10-CM | POA: Insufficient documentation

## 2020-04-02 DIAGNOSIS — D123 Benign neoplasm of transverse colon: Secondary | ICD-10-CM | POA: Insufficient documentation

## 2020-04-02 DIAGNOSIS — D125 Benign neoplasm of sigmoid colon: Secondary | ICD-10-CM | POA: Insufficient documentation

## 2020-04-02 DIAGNOSIS — D124 Benign neoplasm of descending colon: Secondary | ICD-10-CM | POA: Insufficient documentation

## 2020-04-02 DIAGNOSIS — K6389 Other specified diseases of intestine: Secondary | ICD-10-CM | POA: Insufficient documentation

## 2020-04-02 DIAGNOSIS — K648 Other hemorrhoids: Secondary | ICD-10-CM | POA: Insufficient documentation

## 2020-04-02 DIAGNOSIS — F329 Major depressive disorder, single episode, unspecified: Secondary | ICD-10-CM | POA: Insufficient documentation

## 2020-04-02 DIAGNOSIS — F419 Anxiety disorder, unspecified: Secondary | ICD-10-CM | POA: Insufficient documentation

## 2020-04-02 DIAGNOSIS — K2970 Gastritis, unspecified, without bleeding: Secondary | ICD-10-CM | POA: Insufficient documentation

## 2020-04-02 DIAGNOSIS — K635 Polyp of colon: Secondary | ICD-10-CM

## 2020-04-02 DIAGNOSIS — K625 Hemorrhage of anus and rectum: Secondary | ICD-10-CM | POA: Insufficient documentation

## 2020-04-02 DIAGNOSIS — K228 Other specified diseases of esophagus: Secondary | ICD-10-CM

## 2020-04-02 HISTORY — PX: COLONOSCOPY WITH PROPOFOL: SHX5780

## 2020-04-02 HISTORY — PX: ESOPHAGOGASTRODUODENOSCOPY (EGD) WITH PROPOFOL: SHX5813

## 2020-04-02 HISTORY — PX: POLYPECTOMY: SHX5525

## 2020-04-02 HISTORY — PX: BIOPSY: SHX5522

## 2020-04-02 SURGERY — COLONOSCOPY WITH PROPOFOL
Anesthesia: General

## 2020-04-02 MED ORDER — LIDOCAINE HCL (CARDIAC) PF 100 MG/5ML IV SOSY
PREFILLED_SYRINGE | INTRAVENOUS | Status: DC | PRN
  Administered 2020-04-02: 80 mg via INTRATRACHEAL

## 2020-04-02 MED ORDER — ESOMEPRAZOLE MAGNESIUM 40 MG PO CPDR: 40.0000 mg | DELAYED_RELEASE_CAPSULE | Freq: Two times a day (BID) | ORAL | 5 refills | Status: DC

## 2020-04-02 MED ORDER — GLYCOPYRROLATE 0.2 MG/ML IJ SOLN
INTRAMUSCULAR | Status: AC
  Filled 2020-04-02: qty 1

## 2020-04-02 MED ORDER — PROPOFOL 500 MG/50ML IV EMUL
INTRAVENOUS | Status: DC | PRN
  Administered 2020-04-02: 175 ug/kg/min via INTRAVENOUS
  Administered 2020-04-02: 120 mg via INTRAVENOUS

## 2020-04-02 MED ORDER — STERILE WATER FOR IRRIGATION IR SOLN
Status: DC | PRN
  Administered 2020-04-02: 1.5 mL

## 2020-04-02 MED ORDER — LACTATED RINGERS IV SOLN: INTRAVENOUS | Status: DC | PRN

## 2020-04-02 MED ORDER — LIDOCAINE VISCOUS HCL 2 % MT SOLN
OROMUCOSAL | Status: AC
  Filled 2020-04-02: qty 15

## 2020-04-02 MED ORDER — GLYCOPYRROLATE 0.2 MG/ML IJ SOLN
0.2000 mg | Freq: Once | INTRAMUSCULAR | Status: AC
  Administered 2020-04-02: 0.2 mg via INTRAVENOUS

## 2020-04-02 MED ORDER — LACTATED RINGERS IV SOLN: Freq: Once | INTRAVENOUS | Status: AC

## 2020-04-02 MED ORDER — LIDOCAINE VISCOUS HCL 2 % MT SOLN
15.0000 mL | Freq: Once | OROMUCOSAL | Status: AC
  Administered 2020-04-02: 15 mL via OROMUCOSAL

## 2020-04-02 NOTE — Anesthesia Postprocedure Evaluation (Signed)
Anesthesia Post Note  Patient: Gabriel Koch  Procedure(s) Performed: COLONOSCOPY WITH PROPOFOL (N/A ) ESOPHAGOGASTRODUODENOSCOPY (EGD) WITH PROPOFOL (N/A ) BIOPSY POLYPECTOMY  Patient location during evaluation: PACU Anesthesia Type: General Level of consciousness: awake, oriented, awake and alert and patient cooperative Pain management: pain level controlled Vital Signs Assessment: post-procedure vital signs reviewed and stable Respiratory status: spontaneous breathing, respiratory function stable and nonlabored ventilation Cardiovascular status: blood pressure returned to baseline and stable Postop Assessment: no backache and no headache Anesthetic complications: no   No complications documented.   Last Vitals:  Vitals:   04/02/20 0727 04/02/20 0730  BP: (!) 154/97   Pulse: 76 72  Resp: 16 16  Temp: 36.6 C   SpO2: 98% 100%    Last Pain:  Vitals:   04/02/20 0802  PainSc: [place]

## 2020-04-02 NOTE — Transfer of Care (Signed)
Immediate Anesthesia Transfer of Care Note  Patient: Gabriel Koch  Procedure(s) Performed: COLONOSCOPY WITH PROPOFOL (N/A ) ESOPHAGOGASTRODUODENOSCOPY (EGD) WITH PROPOFOL (N/A ) BIOPSY POLYPECTOMY  Patient Location: PACU  Anesthesia Type:General  Level of Consciousness: awake, alert , oriented and patient cooperative  Airway & Oxygen Therapy: Patient Spontanous Breathing  Post-op Assessment: Report given to RN, Post -op Vital signs reviewed and stable and Patient moving all extremities  Post vital signs: Reviewed and stable  Last Vitals:  Vitals Value Taken Time  BP    Temp    Pulse    Resp    SpO2      Last Pain:  Vitals:   04/02/20 0802  PainSc: 2          Complications: No complications documented.

## 2020-04-02 NOTE — Op Note (Signed)
[facility] Patient Name: Gabriel Koch Procedure Date: 04/02/2020 8:12 AM MRN: [id number] Date of Birth: [birth year] Attending MD: Elon Alas. Abbey Chatters DO CSN: [id number] Age: 54 Admit Type: Outpatient Procedure:                Colonoscopy Indications:              Rectal bleeding Providers:                Elon Alas. Abbey Chatters, DO, Janeece Riggers, RN, Lambert Mody, Crystal Page Referring MD:              Medicines:                See the Anesthesia note for documentation of the                            administered medications Complications:            No immediate complications. Estimated Blood Loss:     Estimated blood loss was minimal. Procedure:                Pre-Anesthesia Assessment:                           - The anesthesia plan was to use monitored                            anesthesia care (MAC).                           After obtaining informed consent, the colonoscope                            was passed under direct vision. Throughout the                            procedure, the patient's blood pressure, pulse, and                            oxygen saturations were monitored continuously. The                            [id number]-HQ190L ([id number]) scope was introduced through                            the anus and advanced to the the cecum, identified                            by appendiceal orifice and ileocecal valve. The                            colonoscopy was performed without difficulty. The                            patient tolerated the procedure well. The quality  of the bowel preparation was evaluated using the                            BBPS [facility] Bowel Preparation Scale) with scores                            of: Right Colon = 2 (minor amount of residual                            staining, small fragments of stool and/or opaque                            liquid, but mucosa seen well), Transverse  Colon = 2                            (minor amount of residual staining, small fragments                            of stool and/or opaque liquid, but mucosa seen                            well) and Left Colon = 2 (minor amount of residual                            staining, small fragments of stool and/or opaque                            liquid, but mucosa seen well). The total BBPS score                            equals 6. The quality of the bowel preparation was                            fair. Scope In: 8:14:54 AM Scope Out: 8:40:56 AM Scope Withdrawal Time: 0 hours 19 minutes 29 seconds  Total Procedure Duration: 0 hours 26 minutes 2 seconds  Findings:      The perianal and digital rectal examinations were normal.      Non-bleeding internal hemorrhoids were found during endoscopy.      A few small-mouthed diverticula were found in the sigmoid colon.      Three sessile polyps were found in the transverse colon. The polyps were       10 to 12 mm in size. These polyps were removed with a hot snare.       Resection and retrieval were complete.      A 7 mm polyp was found in the transverse colon. The polyp was sessile.       The polyp was removed with a cold snare. Resection and retrieval were       complete.      Two sessile polyps were found in the descending colon. The polyps were 5       to 7 mm in size. These polyps were removed with a cold snare. Resection       and retrieval were complete.  Two pedunculated polyps were found in the sigmoid colon. The polyps were       11 to 13 mm in size. These polyps were removed with a hot snare.       Resection and retrieval were complete.      A localized area of nodular mucosa was found at the appendiceal orifice.       Biopsies were taken with a cold forceps for histology. Impression:               - Preparation of the colon was fair.                           - Non-bleeding internal hemorrhoids.                           -  Diverticulosis in the sigmoid colon.                           - Three 10 to 12 mm polyps in the transverse colon,                            removed with a hot snare. Resected and retrieved.                           - One 7 mm polyp in the transverse colon, removed                            with a cold snare. Resected and retrieved.                           - Two 5 to 7 mm polyps in the descending colon,                            removed with a cold snare. Resected and retrieved.                           - Two 11 to 13 mm polyps in the sigmoid colon,                            removed with a hot snare. Resected and retrieved.                           - Nodular mucosa at the appendiceal orifice.                            Biopsied. Moderate Sedation:      Per Anesthesia Care Recommendation:           - Patient has a contact number available for                            emergencies. The signs and symptoms of potential                            delayed complications were  discussed with the                            patient. Return to normal activities tomorrow.                            Written discharge instructions were provided to the                            patient.                           - Resume previous diet.                           - Continue present medications.                           - Await pathology results.                           - Repeat colonoscopy in 1 year for surveillance.                           - Return to GI clinic as previously scheduled. Procedure Code(s):        --- Professional ---                           [phone], Colonoscopy, flexible; with removal of                            tumor(s), polyp(s), or other lesion(s) by snare                            technique                           [id number], 36, Colonoscopy, flexible; with biopsy,                            single or multiple Diagnosis Code(s):        --- Professional ---                            K64.8, Other hemorrhoids                           K63.5, Polyp of colon                           K63.89, Other specified diseases of intestine                           K62.5, Hemorrhage of anus and rectum                           K57.30, Diverticulosis of large intestine without  perforation or abscess without bleeding CPT copyright 2019 American Medical Association. All rights reserved. The codes documented in this report are preliminary and upon coder review may  be revised to meet current compliance requirements. Elon Alas. Abbey Chatters, DO [place] Abbey Chatters, DO 04/02/2020 8:45:27 AM This report has been signed electronically. Number of Addenda: 0

## 2020-04-02 NOTE — Op Note (Signed)
[name] Patient Name: Gabriel Koch Procedure Date: 04/02/2020 7:50 AM MRN: [id number] Date of Birth: 1965/10/16 Attending MD: [name] CSN: [id number] Age: 54 Admit Type: Outpatient Procedure:                Upper GI endoscopy Indications:              Epigastric abdominal pain, Heartburn Providers:                Elon Alas. Abbey Chatters, DO, Janeece Riggers, RN, Lambert Mody, Crystal Page Referring MD:              Medicines:                See the Anesthesia note for documentation of the                            administered medications Complications:            No immediate complications. Estimated Blood Loss:     Estimated blood loss was minimal. Procedure:                Pre-Anesthesia Assessment:                           - The anesthesia plan was to use monitored                            anesthesia care (MAC).                           After obtaining informed consent, the endoscope was                            passed under direct vision. Throughout the                            procedure, the patient's blood pressure, pulse, and                            oxygen saturations were monitored continuously. The                            [id number]-[id number] ([id number]) scope was introduced through the                            mouth, and advanced to the second part of duodenum.                            The upper GI endoscopy was accomplished without                            difficulty. The patient tolerated the procedure                            well. Scope In:  8:05:32 AM Scope Out: 8:10:01 AM Total Procedure Duration: 0 hours 4 minutes 29 seconds  Findings:      A small hiatal hernia was present.      The esophagus and gastroesophageal junction were examined with white       light and narrow band imaging (NBI) from a forward view and retroflexed       position. There were esophageal mucosal changes suggestive of        short-segment Barrett's esophagus. These changes involved the mucosa       extending to the Z-line. Multiple tongues of salmon-colored mucosa were       present. The maximum longitudinal extent of these esophageal mucosal       changes was 2 cm in length. Mucosa was biopsied with a cold forceps for       histology.      Diffuse moderate inflammation characterized by erosions and erythema was       found in the entire examined stomach. Biopsies were taken with a cold       forceps for Helicobacter pylori testing.      The duodenal bulb, first portion of the duodenum and second portion of       the duodenum were normal. Biopsies for histology were taken with a cold       forceps for evaluation of celiac disease. Impression:               - Small hiatal hernia.                           - Esophageal mucosal changes suggestive of                            short-segment Barrett's esophagus. Biopsied.                           - Gastritis. Biopsied.                           - Normal duodenal bulb, first portion of the                            duodenum and second portion of the duodenum.                            Biopsied. Moderate Sedation:      Per Anesthesia Care Recommendation:           - Patient has a contact number available for                            emergencies. The signs and symptoms of potential                            delayed complications were discussed with the                            patient. Return to normal activities tomorrow.  Written discharge instructions were provided to the                            patient.                           - Resume previous diet.                           - Continue present medications.                           - Await pathology results.                           - Repeat upper endoscopy in 3 years for                            surveillance based on pathology results.                           -  Return to GI clinic as previously scheduled.                           - Use Nexium (esomeprazole) 40 mg PO BID. Procedure Code(s):        --- Professional ---                           [phone], Esophagogastroduodenoscopy, flexible,                            transoral; with biopsy, single or multiple Diagnosis Code(s):        --- Professional ---                           K44.9, Diaphragmatic hernia without obstruction or                            gangrene                           K22.8, Other specified diseases of esophagus                           K29.70, Gastritis, unspecified, without bleeding                           R10.13, Epigastric pain                           R12, Heartburn CPT copyright 2019 [employer]. All rights reserved. The codes documented in this report are preliminary and upon coder review may  be revised to meet current compliance requirements. Elon Alas. Abbey Chatters, DO [place] Abbey Chatters, DO 04/02/2020 8:13:46 AM This report has been signed electronically. Number of Addenda: 0

## 2020-04-02 NOTE — Anesthesia Preprocedure Evaluation (Signed)
Anesthesia Evaluation  Patient identified by MRN, date of birth, ID band Patient awake    Reviewed: Allergy & Precautions, NPO status , Patient's Chart, lab work & pertinent test results  History of Anesthesia Complications (+) history of anesthetic complications  Airway Mallampati: II  TM Distance: >3 FB Neck ROM: Full    Dental  (+) Dental Advisory Given, Poor Dentition, Loose, Chipped,    Pulmonary Current SmokerPatient did not abstain from smoking.,    Pulmonary exam normal breath sounds clear to auscultation       Cardiovascular Exercise Tolerance: Good hypertension, Pt. on medications Normal cardiovascular exam Rhythm:Regular Rate:Normal     Neuro/Psych PSYCHIATRIC DISORDERS Anxiety Depression    GI/Hepatic GERD  Medicated,(+)     substance abuse  alcohol use,   Endo/Other  negative endocrine ROS  Renal/GU negative Renal ROS  negative genitourinary   Musculoskeletal negative musculoskeletal ROS (+)   Abdominal   Peds  Hematology negative hematology ROS (+)   Anesthesia Other Findings   Reproductive/Obstetrics negative OB ROS                             Anesthesia Physical Anesthesia Plan  ASA: III  Anesthesia Plan: General   Post-op Pain Management:    Induction: Intravenous  PONV Risk Score and Plan: TIVA  Airway Management Planned: Nasal Cannula and Natural Airway  Additional Equipment:   Intra-op Plan:   Post-operative Plan:   Informed Consent: I have reviewed the patients History and Physical, chart, labs and discussed the procedure including the risks, benefits and alternatives for the proposed anesthesia with the patient or authorized representative who has indicated his/her understanding and acceptance.     Dental advisory given  Plan Discussed with: CRNA and Surgeon  Anesthesia Plan Comments:         Anesthesia Quick Evaluation

## 2020-04-02 NOTE — Discharge Instructions (Addendum)
EGD Discharge instructions Please read the instructions outlined below and refer to this sheet in the next few weeks. These discharge instructions provide you with general information on caring for yourself after you leave the hospital. Your doctor may also give you specific instructions. While your treatment has been planned according to the most current medical practices available, unavoidable complications occasionally occur. If you have any problems or questions after discharge, please call your doctor. ACTIVITY  You may resume your regular activity but move at a slower pace for the next 24 hours.   Take frequent rest periods for the next 24 hours.   Walking will help expel (get rid of) the air and reduce the bloated feeling in your abdomen.   No driving for 24 hours (because of the anesthesia (medicine) used during the test).   You may shower.   Do not sign any important legal documents or operate any machinery for 24 hours (because of the anesthesia used during the test).  NUTRITION  Drink plenty of fluids.   You may resume your normal diet.   Begin with a light meal and progress to your normal diet.   Avoid alcoholic beverages for 24 hours or as instructed by your caregiver.  MEDICATIONS  You may resume your normal medications unless your caregiver tells you otherwise.  WHAT YOU CAN EXPECT TODAY  You may experience abdominal discomfort such as a feeling of fullness or "gas" pains.  FOLLOW-UP  Your doctor will discuss the results of your test with you.  SEEK IMMEDIATE MEDICAL ATTENTION IF ANY OF THE FOLLOWING OCCUR:  Excessive nausea (feeling sick to your stomach) and/or vomiting.   Severe abdominal pain and distention (swelling).   Trouble swallowing.   Temperature over 101 F (37.8 C).   Rectal bleeding or vomiting of blood.     Colonoscopy Discharge Instructions  Read the instructions outlined below and refer to this sheet in the next few weeks. These  discharge instructions provide you with general information on caring for yourself after you leave the hospital. Your doctor may also give you specific instructions. While your treatment has been planned according to the most current medical practices available, unavoidable complications occasionally occur.   ACTIVITY  You may resume your regular activity, but move at a slower pace for the next 24 hours.   Take frequent rest periods for the next 24 hours.   Walking will help get rid of the air and reduce the bloated feeling in your belly (abdomen).   No driving for 24 hours (because of the medicine (anesthesia) used during the test).    Do not sign any important legal documents or operate any machinery for 24 hours (because of the anesthesia used during the test).  NUTRITION  Drink plenty of fluids.   You may resume your normal diet as instructed by your doctor.   Begin with a light meal and progress to your normal diet. Heavy or fried foods are harder to digest and may make you feel sick to your stomach (nauseated).   Avoid alcoholic beverages for 24 hours or as instructed.  MEDICATIONS  You may resume your normal medications unless your doctor tells you otherwise.  WHAT YOU CAN EXPECT TODAY  Some feelings of bloating in the abdomen.   Passage of more gas than usual.   Spotting of blood in your stool or on the toilet paper.  IF YOU HAD POLYPS REMOVED DURING THE COLONOSCOPY:  No aspirin products for 7 days or as instructed.  No alcohol for 7 days or as instructed.   Eat a soft diet for the next 24 hours.  FINDING OUT THE RESULTS OF YOUR TEST Not all test results are available during your visit. If your test results are not back during the visit, make an appointment with your caregiver to find out the results. Do not assume everything is normal if you have not heard from your caregiver or the medical facility. It is important for you to follow up on all of your test results.    SEEK IMMEDIATE MEDICAL ATTENTION IF:  You have more than a spotting of blood in your stool.   Your belly is swollen (abdominal distention).   You are nauseated or vomiting.   You have a temperature over 101.   You have abdominal pain or discomfort that is severe or gets worse throughout the day.   Your EGD showed a large amount of inflammation in your stomach.  I biopsied this to rule out infection with a bacteria called H. pylori.  I also biopsied your small bowel to rule out celiac disease.  Await pathology results, my office will contact you.  I am increasing your Nexium to 40 mg twice daily.  I sent a new prescription to your pharmacy.  Your colonoscopy showed 8 polyps which I removed successfully.  Given the size and location of these, I would recommend that we repeat colonoscopy in 1 year for surveillance.  You do have internal hemorrhoids which is likely causing your bleeding.  We can consider hemorrhoid banding in the outpatient clinic for this if bleeding continues.  Follow-up with GI in 6 to 8 weeks or as previously scheduled with Kristen.  I hope you have a great rest of your week!  Elon Alas. Abbey Chatters, D.O. Gastroenterology and Hepatology [facility]   Monitored Anesthesia Care, Care After These instructions provide you with information about caring for yourself after your procedure. Your health care provider may also give you more specific instructions. Your treatment has been planned according to current medical practices, but problems sometimes occur. Call your health care provider if you have any problems or questions after your procedure. What can I expect after the procedure? After your procedure, you may:  Feel sleepy for several hours.  Feel clumsy and have poor balance for several hours.  Feel forgetful about what happened after the procedure.  Have poor judgment for several hours.  Feel nauseous or vomit.  Have a sore throat if  you had a breathing tube during the procedure. Follow these instructions at home: For at least 24 hours after the procedure:      Have a responsible adult stay with you. It is important to have someone help care for you until you are awake and alert.  Rest as needed.  Do not: ? Participate in activities in which you could fall or become injured. ? Drive. ? Use heavy machinery. ? Drink alcohol. ? Take sleeping pills or medicines that cause drowsiness. ? Make important decisions or sign legal documents. ? Take care of children on your own. Eating and drinking  Follow the diet that is recommended by your health care provider.  If you vomit, drink water, juice, or soup when you can drink without vomiting.  Make sure you have little or no nausea before eating solid foods. General instructions  Take over-the-counter and prescription medicines only as told by your health care provider.  If you have sleep apnea, surgery and certain medicines can increase  your risk for breathing problems. Follow instructions from your health care provider about wearing your sleep device: ? Anytime you are sleeping, including during daytime naps. ? While taking prescription pain medicines, sleeping medicines, or medicines that make you drowsy.  If you smoke, do not smoke without supervision.  Keep all follow-up visits as told by your health care provider. This is important. Contact a health care provider if:  You keep feeling nauseous or you keep vomiting.  You feel light-headed.  You develop a rash.  You have a fever. Get help right away if:  You have trouble breathing. Summary  For several hours after your procedure, you may feel sleepy and have poor judgment.  Have a responsible adult stay with you for at least 24 hours or until you are awake and alert. This information is not intended to replace advice given to you by your health care provider. Make sure you discuss any questions you  have with your health care provider. Document Revised: 09/20/2017 Document Reviewed: 10/13/2015 Elsevier Patient Education  2020 [employer].   Hemorrhoids Hemorrhoids are swollen veins that may develop:  In the butt (rectum). These are called internal hemorrhoids.  Around the opening of the butt (anus). These are called external hemorrhoids. Hemorrhoids can cause pain, itching, or bleeding. Most of the time, they do not cause serious problems. They usually get better with diet changes, lifestyle changes, and other home treatments. What are the causes? This condition may be caused by:  Having trouble pooping (constipation).  Pushing hard (straining) to poop.  Watery poop (diarrhea).  Pregnancy.  Being very overweight (obese).  Sitting for long periods of time.  Heavy lifting or other activity that causes you to strain.  Anal sex.  Riding a bike for a long period of time. What are the signs or symptoms? Symptoms of this condition include:  Pain.  Itching or soreness in the butt.  Bleeding from the butt.  Leaking poop.  Swelling in the area.  One or more lumps around the opening of your butt. How is this diagnosed? A doctor can often diagnose this condition by looking at the affected area. The doctor may also:  Do an exam that involves feeling the area with a gloved hand (digital rectal exam).  Examine the area inside your butt using a small tube (anoscope).  Order blood tests. This may be done if you have lost a lot of blood.  Have you get a test that involves looking inside the colon using a flexible tube with a camera on the end (sigmoidoscopy or colonoscopy). How is this treated? This condition can usually be treated at home. Your doctor may tell you to change what you eat, make lifestyle changes, or try home treatments. If these do not help, procedures can be done to remove the hemorrhoids or make them smaller. These may involve:  Placing rubber bands  at the base of the hemorrhoids to cut off their blood supply.  Injecting medicine into the hemorrhoids to shrink them.  Shining a type of light energy onto the hemorrhoids to cause them to fall off.  Doing surgery to remove the hemorrhoids or cut off their blood supply. Follow these instructions at home: Eating and drinking   Eat foods that have a lot of fiber in them. These include whole grains, beans, nuts, fruits, and vegetables.  Ask your doctor about taking products that have added fiber (fibersupplements).  Reduce the amount of fat in your diet. You can do this  by: ? Eating low-fat dairy products. ? Eating less red meat. ? Avoiding processed foods.  Drink enough fluid to keep your pee (urine) pale yellow. Managing pain and swelling   Take a warm-water bath (sitz bath) for 20 minutes to ease pain. Do this 3-4 times a day. You may do this in a bathtub or using a portable sitz bath that fits over the toilet.  If told, put ice on the painful area. It may be helpful to use ice between your warm baths. ? Put ice in a plastic bag. ? Place a towel between your skin and the bag. ? Leave the ice on for 20 minutes, 2-3 times a day. General instructions  Take over-the-counter and prescription medicines only as told by your doctor. ? Medicated creams and medicines may be used as told.  Exercise often. Ask your doctor how much and what kind of exercise is best for you.  Go to the bathroom when you have the urge to poop. Do not wait.  Avoid pushing too hard when you poop.  Keep your butt dry and clean. Use wet toilet paper or moist towelettes after pooping.  Do not sit on the toilet for a long time.  Keep all follow-up visits as told by your doctor. This is important. Contact a doctor if you:  Have pain and swelling that do not get better with treatment or medicine.  Have trouble pooping.  Cannot poop.  Have pain or swelling outside the area of the hemorrhoids. Get help  right away if you have:  Bleeding that will not stop. Summary  Hemorrhoids are swollen veins in the butt or around the opening of the butt.  They can cause pain, itching, or bleeding.  Eat foods that have a lot of fiber in them. These include whole grains, beans, nuts, fruits, and vegetables.  Take a warm-water bath (sitz bath) for 20 minutes to ease pain. Do this 3-4 times a day. This information is not intended to replace advice given to you by your health care provider. Make sure you discuss any questions you have with your health care provider. Document Revised: 06/30/2018 Document Reviewed: 11/11/2017 Elsevier Patient Education  [place].  Colon Polyps  Polyps are tissue growths inside the body. Polyps can grow in many places, including the large intestine (colon). A polyp may be a round bump or a mushroom-shaped growth. You could have one polyp or several. Most colon polyps are noncancerous (benign). However, some colon polyps can become cancerous over time. Finding and removing the polyps early can help prevent this. What are the causes? The exact cause of colon polyps is not known. What increases the risk? You are more likely to develop this condition if you:  Have a family history of colon cancer or colon polyps.  Are older than 43 or older than 45 if you are African American.  Have inflammatory bowel disease, such as ulcerative colitis or Crohn's disease.  Have certain hereditary conditions, such as: ? Familial adenomatous polyposis. ? Lynch syndrome. ? Turcot syndrome. ? Peutz-Jeghers syndrome.  Are overweight.  Smoke cigarettes.  Do not get enough exercise.  Drink too much alcohol.  Eat a diet that is high in fat and red meat and low in fiber.  Had childhood cancer that was treated with abdominal radiation. What are the signs or symptoms? Most polyps do not cause symptoms. If you have symptoms, they may include:  Blood coming from your rectum  when having a bowel movement.  Blood in your stool. The stool may look dark red or black.  Abdominal pain.  A change in bowel habits, such as constipation or diarrhea. How is this diagnosed? This condition is diagnosed with a colonoscopy. This is a procedure in which a lighted, flexible scope is inserted into the anus and then passed into the colon to examine the area. Polyps are sometimes found when a colonoscopy is done as part of routine cancer screening tests. How is this treated? Treatment for this condition involves removing any polyps that are found. Most polyps can be removed during a colonoscopy. Those polyps will then be tested for cancer. Additional treatment may be needed depending on the results of testing. Follow these instructions at home: Lifestyle  Maintain a healthy weight, or lose weight if recommended by your health care provider.  Exercise every day or as told by your health care provider.  Do not use any products that contain nicotine or tobacco, such as cigarettes and e-cigarettes. If you need help quitting, ask your health care provider.  If you drink alcohol, limit how much you have: ? 0-1 drink a day for women. ? 0-2 drinks a day for men.  Be aware of how much alcohol is in your drink. In the [place] one drink equals one 12 oz bottle of beer (355 mL), one 5 oz glass of wine (148 mL), or one 1 oz shot of hard liquor (44 mL). Eating and drinking   Eat foods that are high in fiber, such as fruits, vegetables, and whole grains.  Eat foods that are high in calcium and vitamin D, such as milk, cheese, yogurt, eggs, liver, fish, and broccoli.  Limit foods that are high in fat, such as fried foods and desserts.  Limit the amount of red meat and processed meat you eat, such as hot dogs, sausage, bacon, and lunch meats. General instructions  Keep all follow-up visits as told by your health care provider. This is important. ? This includes having regularly  scheduled colonoscopies. ? Talk to your health care provider about when you need a colonoscopy. Contact a health care provider if:  You have new or worsening bleeding during a bowel movement.  You have new or increased blood in your stool.  You have a change in bowel habits.  You lose weight for no known reason. Summary  Polyps are tissue growths inside the body. Polyps can grow in many places, including the colon.  Most colon polyps are noncancerous (benign), but some can become cancerous over time.  This condition is diagnosed with a colonoscopy.  Treatment for this condition involves removing any polyps that are found. Most polyps can be removed during a colonoscopy. This information is not intended to replace advice given to you by your health care provider. Make sure you discuss any questions you have with your health care provider. Document Revised: 10/07/2017 Document Reviewed: 10/07/2017 Elsevier Patient Education  [place].  Diverticulitis  Diverticulitis is when small pockets in your large intestine (colon) get infected or swollen. This causes stomach pain and watery poop (diarrhea). These pouches are called diverticula. They form in people who have a condition called diverticulosis. Follow these instructions at home: Medicines  Take over-the-counter and prescription medicines only as told by your doctor. These include: ? Antibiotics. ? Pain medicines. ? Fiber pills. ? Probiotics. ? Stool softeners.  Do not drive or use heavy machinery while taking prescription pain medicine.  If you were prescribed an antibiotic, take it as  told. Do not stop taking it even if you feel better. General instructions   Follow a diet as told by your doctor.  When you feel better, your doctor may tell you to change your diet. You may need to eat a lot of fiber. Fiber makes it easier to poop (have bowel movements). Healthy foods with fiber  include: ? Berries. ? Beans. ? Lentils. ? Green vegetables.  Exercise 3 or more times a week. Aim for 30 minutes each time. Exercise enough to sweat and make your heart beat faster.  Keep all follow-up visits as told. This is important. You may need to have an exam of the large intestine. This is called a colonoscopy. Contact a doctor if:  Your pain does not get better.  You have a hard time eating or drinking.  You are not pooping like normal. Get help right away if:  Your pain gets worse.  Your problems do not get better.  Your problems get worse very fast.  You have a fever.  You throw up (vomit) more than one time.  You have poop that is: ? Bloody. ? Black. ? Tarry. Summary  Diverticulitis is when small pockets in your large intestine (colon) get infected or swollen.  Take medicines only as told by your doctor.  Follow a diet as told by your doctor. This information is not intended to replace advice given to you by your health care provider. Make sure you discuss any questions you have with your health care provider. Document Revised: 06/04/2017 Document Reviewed: 07/09/2016 Elsevier Patient Education  [place].

## 2020-04-02 NOTE — H&P (Signed)
Primary Care Physician:  Redmond School, MD Primary Gastroenterologist:  Dr. Abbey Chatters  Pre-Procedure History & Physical: HPI:  Gabriel Koch is a 54 y.o. male is here for an EGD for nausea colonoscopy to be performed for rectal bleeding and diarrhea  Past Medical History:  Diagnosis Date  . Alcohol abuse   . Anxiety and depression   . Blood transfusion   . Complication of anesthesia    patient hemorrrhaged about 2 weeks after tonsillecotmy  . GERD (gastroesophageal reflux disease)   . Hypertension     Past Surgical History:  Procedure Laterality Date  . APPENDECTOMY    . TONSILLECTOMY      Prior to Admission medications   Medication Sig Start Date End Date Taking? Authorizing Provider  acetaminophen (TYLENOL) 325 MG tablet Take 650 mg by mouth every 6 (six) hours as needed.   Yes [provider]  Alum & Mag Hydroxide-Simeth (MYLANTA PO) Take by mouth as needed.   Yes [provider]  amLODipine (NORVASC) 5 MG tablet Take 5 mg by mouth daily.   Yes [provider]  busPIRone (BUSPAR) 10 MG tablet Take 10 mg by mouth 2 (two) times daily.   Yes [provider]  esomeprazole (NEXIUM) 20 MG capsule Take 1 capsule (20 mg total) by mouth in the morning and at bedtime. 01/19/20  Yes Erenest Rasher, PA-C  HYDROcodone-acetaminophen (NORCO) 10-325 MG tablet Take 1 tablet by mouth 4 (four) times daily as needed.   Yes [provider]  potassium chloride SA (KLOR-CON) 20 MEQ tablet Take 20 mEq by mouth in the morning, at noon, in the evening, and at bedtime. Taking daily.   Yes [provider]  tiZANidine (ZANAFLEX) 4 MG tablet Take 4 mg by mouth daily.   Yes [provider]  traZODone (DESYREL) 100 MG tablet Take 100 mg by mouth 2 (two) times daily.   Yes [provider]  Menthol, Topical Analgesic, (ICY HOT EX) Apply topically as needed.    [provider]  trolamine salicylate (ASPERCREME) 10 % cream Apply  1 application topically as needed for muscle pain.    [provider]    Allergies as of 02/13/2020 - Review Complete 01/19/2020  Allergen Reaction Noted  . Bee venom  09/09/2011    Family History  Problem Relation Age of Onset  . Colon cancer Neg Hx        Pt knows little about family history    Social History   Socioeconomic History  . Marital status: Legally Separated    Spouse name: Not on file  . Number of children: Not on file  . Years of education: Not on file  . Highest education level: Not on file  Occupational History  . Not on file  Tobacco Use  . Smoking status: Current Every Day Smoker    Packs/day: 1.00    Years: 35.00    Pack years: 35.00    Types: Cigarettes  . Smokeless tobacco: Never Used  Substance and Sexual Activity  . Alcohol use: Yes    Alcohol/week: 6.0 standard drinks    Types: 6 Cans of beer per week    Comment:  previous alcoholic  . Drug use: No  . Sexual activity: Not on file  Other Topics Concern  . Not on file  Social History Narrative  . Not on file   Social Determinants of Health   Financial Resource Strain:   . Difficulty of Paying Living Expenses: Not on  file  Food Insecurity:   . Worried About Charity fundraiser in the Last Year: Not on file  . Ran Out of Food in the Last Year: Not on file  Transportation Needs:   . Lack of Transportation (Medical): Not on file  . Lack of Transportation (Non-Medical): Not on file  Physical Activity:   . Days of Exercise per Week: Not on file  . Minutes of Exercise per Session: Not on file  Stress:   . Feeling of Stress : Not on file  Social Connections:   . Frequency of Communication with Friends and Family: Not on file  . Frequency of Social Gatherings with Friends and Family: Not on file  . Attends Religious Services: Not on file  . Active Member of Clubs or Organizations: Not on file  . Attends Archivist Meetings: Not on file  . Marital Status: Not on file   Intimate Partner Violence:   . Fear of Current or Ex-Partner: Not on file  . Emotionally Abused: Not on file  . Physically Abused: Not on file  . Sexually Abused: Not on file    Review of Systems: See HPI, otherwise negative ROS  Impression/Plan: Gabriel Koch is here for an EGD for nausea colonoscopy to be performed for rectal bleeding and diarrhea  Risks, benefits, limitations, imponderables and alternatives regarding colonoscopy have been reviewed with the patient. Questions have been answered. All parties agreeable.

## 2020-04-03 ENCOUNTER — Other Ambulatory Visit: Payer: Self-pay

## 2020-04-03 LAB — SURGICAL PATHOLOGY

## 2020-04-05 ENCOUNTER — Encounter (HOSPITAL_COMMUNITY): Payer: Self-pay | Admitting: Internal Medicine

## 2020-04-09 ENCOUNTER — Telehealth: Payer: Self-pay | Admitting: Internal Medicine

## 2020-04-09 NOTE — Telephone Encounter (Signed)
Routing to [employer] for results.

## 2020-04-09 NOTE — Telephone Encounter (Signed)
Pt had colonoscopy on 04/02/2020 and is very anxious to hear from his results. Please call [phone]

## 2020-04-18 ENCOUNTER — Telehealth: Payer: Self-pay | Admitting: *Deleted

## 2020-04-18 NOTE — Telephone Encounter (Signed)
Derrick Ravel, [place], Wayne Both, [place], Elon Alas, DO P Rga Nurse  I have called and discussed this with patient and his aunt Horris Latino. I have also reached out to Dr. Ardis Hughs and discussed case with Dr. Gala Romney. Can we set him up with repeat EGD for duodenal polyp at [facility]. ASA 3. Thank you  ---   [email or web address]

## 2020-04-19 NOTE — Telephone Encounter (Signed)
Spoke with patient. He has been scheduled for 12/7 at 8:00am. Patient aware will mail prep instructions with pre-op/covid test appt. Confirmed mailing address.

## 2020-04-23 ENCOUNTER — Encounter: Payer: Self-pay | Admitting: *Deleted

## 2020-04-24 ENCOUNTER — Telehealth: Payer: Self-pay | Admitting: *Deleted

## 2020-04-24 NOTE — Telephone Encounter (Signed)
Called pt, received VM. Left details taht procedure time on date 12/7 has been changed to 10:30am. Endo also aware

## 2020-04-26 NOTE — Telephone Encounter (Signed)
See telephone note dated 10/14

## 2020-04-28 ENCOUNTER — Encounter (HOSPITAL_COMMUNITY): Payer: Self-pay | Admitting: *Deleted

## 2020-04-28 ENCOUNTER — Emergency Department (HOSPITAL_COMMUNITY): Payer: Self-pay

## 2020-04-28 ENCOUNTER — Other Ambulatory Visit: Payer: Self-pay

## 2020-04-28 ENCOUNTER — Inpatient Hospital Stay (HOSPITAL_COMMUNITY)
Admission: EM | Admit: 2020-04-28 | Discharge: 2020-04-30 | DRG: 066 | Disposition: A | Discharge status: Home / Self Care | Payer: Self-pay | Attending: Family Medicine | Admitting: Family Medicine

## 2020-04-28 DIAGNOSIS — R9431 Abnormal electrocardiogram [ECG] [EKG]: Secondary | ICD-10-CM

## 2020-04-28 DIAGNOSIS — Z20822 Contact with and (suspected) exposure to covid-19: Secondary | ICD-10-CM | POA: Diagnosis present

## 2020-04-28 DIAGNOSIS — F17210 Nicotine dependence, cigarettes, uncomplicated: Secondary | ICD-10-CM | POA: Diagnosis present

## 2020-04-28 DIAGNOSIS — R2689 Other abnormalities of gait and mobility: Secondary | ICD-10-CM | POA: Diagnosis present

## 2020-04-28 DIAGNOSIS — Z720 Tobacco use: Secondary | ICD-10-CM | POA: Diagnosis present

## 2020-04-28 DIAGNOSIS — I639 Cerebral infarction, unspecified: Secondary | ICD-10-CM | POA: Diagnosis present

## 2020-04-28 DIAGNOSIS — E876 Hypokalemia: Secondary | ICD-10-CM

## 2020-04-28 DIAGNOSIS — R200 Anesthesia of skin: Secondary | ICD-10-CM

## 2020-04-28 DIAGNOSIS — Z9114 Patient's other noncompliance with medication regimen: Secondary | ICD-10-CM

## 2020-04-28 DIAGNOSIS — Z9111 Patient's noncompliance with dietary regimen: Secondary | ICD-10-CM

## 2020-04-28 DIAGNOSIS — R531 Weakness: Secondary | ICD-10-CM | POA: Diagnosis present

## 2020-04-28 DIAGNOSIS — F1010 Alcohol abuse, uncomplicated: Secondary | ICD-10-CM | POA: Diagnosis present

## 2020-04-28 DIAGNOSIS — I10 Essential (primary) hypertension: Secondary | ICD-10-CM | POA: Diagnosis present

## 2020-04-28 DIAGNOSIS — Z91199 Patient's noncompliance with other medical treatment and regimen due to unspecified reason: Secondary | ICD-10-CM

## 2020-04-28 DIAGNOSIS — R29810 Facial weakness: Secondary | ICD-10-CM | POA: Diagnosis present

## 2020-04-28 DIAGNOSIS — I6381 Other cerebral infarction due to occlusion or stenosis of small artery: Principal | ICD-10-CM | POA: Diagnosis present

## 2020-04-28 DIAGNOSIS — Z91030 Bee allergy status: Secondary | ICD-10-CM

## 2020-04-28 DIAGNOSIS — F1020 Alcohol dependence, uncomplicated: Secondary | ICD-10-CM | POA: Diagnosis present

## 2020-04-28 DIAGNOSIS — G8324 Monoplegia of upper limb affecting left nondominant side: Secondary | ICD-10-CM | POA: Diagnosis present

## 2020-04-28 DIAGNOSIS — R29701 NIHSS score 1: Secondary | ICD-10-CM | POA: Diagnosis present

## 2020-04-28 DIAGNOSIS — Z79899 Other long term (current) drug therapy: Secondary | ICD-10-CM

## 2020-04-28 DIAGNOSIS — R208 Other disturbances of skin sensation: Secondary | ICD-10-CM | POA: Diagnosis present

## 2020-04-28 DIAGNOSIS — K219 Gastro-esophageal reflux disease without esophagitis: Secondary | ICD-10-CM | POA: Diagnosis present

## 2020-04-28 LAB — CBC
HCT: 35.7 % — ABNORMAL LOW (ref 39.0–52.0)
Hemoglobin: 13 g/dL (ref 13.0–17.0)
MCH: 40.8 pg — ABNORMAL HIGH (ref 26.0–34.0)
MCHC: 36.4 g/dL — ABNORMAL HIGH (ref 30.0–36.0)
MCV: 111.9 fL — ABNORMAL HIGH (ref 80.0–100.0)
Platelets: 166 10*3/uL (ref 150–400)
RBC: 3.19 MIL/uL — ABNORMAL LOW (ref 4.22–5.81)
RDW: 14.6 % (ref 11.5–15.5)
WBC: 4.5 10*3/uL (ref 4.0–10.5)
nRBC: 0.4 % — ABNORMAL HIGH (ref 0.0–0.2)

## 2020-04-28 LAB — BASIC METABOLIC PANEL
Anion gap: 13 (ref 5–15)
BUN: 5 mg/dL — ABNORMAL LOW (ref 6–20)
CO2: 30 mmol/L (ref 22–32)
Calcium: 8.4 mg/dL — ABNORMAL LOW (ref 8.9–10.3)
Chloride: 94 mmol/L — ABNORMAL LOW (ref 98–111)
Creatinine, Ser: 0.96 mg/dL (ref 0.61–1.24)
GFR, Estimated: >60 mL/min (ref >60)
Glucose, Bld: 100 mg/dL — ABNORMAL HIGH (ref 70–99)
Potassium: 2.7 mmol/L — CL (ref 3.5–5.1)
Sodium: 137 mmol/L (ref 135–145)

## 2020-04-28 LAB — MAGNESIUM: Magnesium: 1.2 mg/dL — ABNORMAL LOW (ref 1.7–2.4)

## 2020-04-28 LAB — RESPIRATORY PANEL BY RT PCR (FLU A&B, COVID)
Influenza A by PCR: NEGATIVE
Influenza B by PCR: NEGATIVE
SARS Coronavirus 2 by RT PCR: NEGATIVE

## 2020-04-28 LAB — TROPONIN I (HIGH SENSITIVITY)
Troponin I (High Sensitivity): 10 ng/L (ref <18)
Troponin I (High Sensitivity): 10 ng/L (ref <18)

## 2020-04-28 MED ORDER — MAGNESIUM SULFATE 2 GM/50ML IV SOLN
2.0000 g | Freq: Once | INTRAVENOUS | Status: AC
  Administered 2020-04-28: 2 g via INTRAVENOUS
  Filled 2020-04-28: qty 50

## 2020-04-28 MED ORDER — POTASSIUM CHLORIDE CRYS ER 20 MEQ PO TBCR
40.0000 meq | EXTENDED_RELEASE_TABLET | Freq: Once | ORAL | Status: AC
  Administered 2020-04-28: 40 meq via ORAL
  Filled 2020-04-28: qty 2

## 2020-04-28 NOTE — ED Provider Notes (Signed)
[facility] EMERGENCY DEPARTMENT Provider Note   CSN: [id number] Arrival date & time: 04/28/20  1642     History Chief Complaint  Patient presents with  . Chest Pain    Gabriel Koch is a 54 y.o. male.  Pt presents to the ED today with left facial numbness and numbness to his left arm and leg.  Pt said he also had some cp 2 days ago.  Pt said he first noticed sx on Friday, 10/22.  Pt is able to walk, speak, and swallow normally.         Past Medical History:  Diagnosis Date  . Alcohol abuse   . Anxiety and depression   . Blood transfusion   . Complication of anesthesia    patient hemorrrhaged about 2 weeks after tonsillecotmy  . GERD (gastroesophageal reflux disease)   . Hypertension     Patient Active Problem List   Diagnosis Date Noted  . HTN (hypertension) 01/20/2020  . GERD (gastroesophageal reflux disease) 01/19/2020  . Vomiting 01/19/2020  . Rectal bleeding 01/19/2020  . Alternating constipation and diarrhea 01/19/2020    Past Surgical History:  Procedure Laterality Date  . APPENDECTOMY    . BIOPSY  04/02/2020   Procedure: BIOPSY;  Surgeon: Eloise Harman, DO;  Location: [facility];  Service: Endoscopy;;  . COLONOSCOPY WITH PROPOFOL N/A 04/02/2020   Procedure: COLONOSCOPY WITH PROPOFOL;  Surgeon: Eloise Harman, DO;  Location: [facility];  Service: Endoscopy;  Laterality: N/A;  8:30am  . ESOPHAGOGASTRODUODENOSCOPY (EGD) WITH PROPOFOL N/A 04/02/2020   Procedure: ESOPHAGOGASTRODUODENOSCOPY (EGD) WITH PROPOFOL;  Surgeon: Eloise Harman, DO;  Location: [facility];  Service: Endoscopy;  Laterality: N/A;  . POLYPECTOMY  04/02/2020   Procedure: POLYPECTOMY;  Surgeon: Eloise Harman, DO;  Location: [facility];  Service: Endoscopy;;  . TONSILLECTOMY         Family History  Problem Relation Age of Onset  . Colon cancer Neg Hx        Pt knows little about family history    Social History   Tobacco Use  . Smoking status: Current  Every Day Smoker    Packs/day: 1.00    Years: 35.00    Pack years: 35.00    Types: Cigarettes  . Smokeless tobacco: Never Used  Substance Use Topics  . Alcohol use: Yes    Alcohol/week: 6.0 standard drinks    Types: 6 Cans of beer per week    Comment:  previous alcoholic  . Drug use: No    Home Medications Prior to Admission medications   Medication Sig Start Date End Date Taking? Authorizing Provider  acetaminophen (TYLENOL) 325 MG tablet Take 650 mg by mouth every 6 (six) hours as needed.   Yes [provider]  Alum & Mag Hydroxide-Simeth (MYLANTA PO) Take by mouth as needed.   Yes [provider]  amLODipine (NORVASC) 5 MG tablet Take 5 mg by mouth daily.   Yes [provider]  esomeprazole (NEXIUM) 40 MG capsule Take 1 capsule (40 mg total) by mouth in the morning and at bedtime. 04/02/20 09/29/20 Yes Carver, Elon Alas, DO  HYDROcodone-acetaminophen (NORCO) 10-325 MG tablet Take 1 tablet by mouth 4 (four) times daily as needed.   Yes [provider]  losartan (COZAAR) 50 MG tablet Take 50 mg by mouth daily. 04/22/20  Yes [provider]  Menthol, Topical Analgesic, (ICY HOT EX) Apply topically as needed.   Yes [provider]  potassium chloride SA (KLOR-CON) 20 MEQ tablet Take 20 mEq by mouth in the morning, at noon, in the evening, and at bedtime. Taking daily.   Yes [provider]  tiZANidine (ZANAFLEX) 4 MG tablet Take 4 mg by mouth daily.   Yes [provider]  traZODone (DESYREL) 100 MG tablet Take 100 mg by mouth 2 (two) times daily.   Yes [provider]  trolamine salicylate (ASPERCREME) 10 % cream Apply 1 application topically as needed for muscle pain.   Yes [provider]  busPIRone (BUSPAR) 10 MG tablet Take 10 mg by mouth 2 (two) times daily.    [provider]    Allergies    Bee venom  Review of Systems   Review of Systems  Cardiovascular: Positive for chest pain.   Neurological: Positive for numbness.  All other systems reviewed and are negative.   Physical Exam Updated Vital Signs BP (!) 147/79   Pulse 95   Temp 98.2 F (36.8 C) (Oral)   Resp 18   Ht 5\' 9"  (1.753 m)   Wt 71.7 kg   SpO2 96%   BMI 23.33 kg/m   Physical Exam Vitals and nursing note reviewed.  Constitutional:      Appearance: He is well-developed.  HENT:     Head: Normocephalic and atraumatic.  Eyes:     Extraocular Movements: Extraocular movements intact.     Pupils: Pupils are equal, round, and reactive to light.  Cardiovascular:     Rate and Rhythm: Normal rate and regular rhythm.     Heart sounds: Normal heart sounds.  Pulmonary:     Effort: Pulmonary effort is normal.     Breath sounds: Normal breath sounds.  Abdominal:     General: Bowel sounds are normal.     Palpations: Abdomen is soft.  Musculoskeletal:        General: Normal range of motion.     Cervical back: Normal range of motion and neck supple.  Skin:    General: Skin is warm.     Capillary Refill: Capillary refill takes less than 2 seconds.  Neurological:     Mental Status: He is alert and oriented to person, place, and time.     Comments: Numbness to left face and hand  Psychiatric:        Mood and Affect: Mood normal.        Behavior: Behavior normal.     ED Results / Procedures / Treatments   Labs (all labs ordered are listed, but only abnormal results are displayed) Labs Reviewed  BASIC METABOLIC PANEL - Abnormal; Notable for the following components:      Result Value   Potassium 2.7 (*)    Chloride 94 (*)    Glucose, Bld 100 (*)    BUN 5 (*)    Calcium 8.4 (*)    All other components within normal limits  CBC - Abnormal; Notable for the following components:   RBC 3.19 (*)    HCT 35.7 (*)    MCV 111.9 (*)    MCH 40.8 (*)    MCHC 36.4 (*)    nRBC 0.4 (*)    All other components within normal limits  MAGNESIUM - Abnormal; Notable for the following components:   Magnesium  1.2 (*)    All other components within normal limits  RESPIRATORY PANEL BY RT PCR (FLU A&B, COVID)  LIPID PANEL  HEMOGLOBIN A1C  TROPONIN I (HIGH SENSITIVITY)  TROPONIN I (HIGH  SENSITIVITY)    EKG EKG Interpretation  Date/Time:  Sunday April 28 2020 16:59:38 EDT Ventricular Rate:  103 PR Interval:  176 QRS Duration: 118 QT Interval:  386 QTC Calculation: 505 R Axis:   79 Text Interpretation: Sinus tachycardia Right bundle branch block Abnormal ECG No significant change since last tracing Confirmed by Isla Pence [phone]) on 04/28/2020 9:45:33 PM   Radiology DG Chest 2 View  Result Date: 04/28/2020 CLINICAL DATA:  Chest pain. EXAM: CHEST - 2 VIEW COMPARISON:  Dec 02, 2018 FINDINGS: The heart size and mediastinal contours are within normal limits. Both lungs are clear. Chronic eighth and ninth right rib fractures are seen. IMPRESSION: No active cardiopulmonary disease. Electronically Signed   By: Virgina Norfolk M.D.   On: 04/28/2020 18:07   CT Head Wo Contrast  Result Date: 04/28/2020 CLINICAL DATA:  Neurologic deficit. EXAM: CT HEAD WITHOUT CONTRAST TECHNIQUE: Contiguous axial images were obtained from the base of the skull through the vertex without intravenous contrast. COMPARISON:  Brain MRI 02/09/2009 FINDINGS: Brain: No acute intracranial hemorrhage. No focal mass lesion. No CT evidence of acute infarction. No midline shift or mass effect. No hydrocephalus. Basilar cisterns are patent. Mild cortical atrophy. Vascular: No hyperdense vessel or unexpected calcification. Skull: Normal. Negative for fracture or focal lesion. Sinuses/Orbits: Paranasal sinuses and mastoid air cells are clear. Orbits are clear. Other: None. IMPRESSION: No acute intracranial findings. Electronically Signed   By: Suzy Bouchard M.D.   On: 04/28/2020 19:46    Procedures Procedures (including critical care time)  Medications Ordered in ED Medications  magnesium sulfate IVPB 2 g 50 mL (0 g  Intravenous Stopped 04/28/20 2147)  potassium chloride SA (KLOR-CON) CR tablet 40 mEq (40 mEq Oral Given 04/28/20 2041)    ED Course  I have reviewed the triage vital signs and the nursing notes.  Pertinent labs & imaging results that were available during my care of the patient were reviewed by me and considered in my medical decision making (see chart for details).    MDM Rules/Calculators/A&P                          Pt's K and Mg are both low.  Pt given 2 g Mag and 40 meq KDur.  Pt still has numbness to his face and to arm.  I requested teleneurology to see patient.  Dr. Blanca Friend saw pt. He requested admission for stroke work up.  MRI in the morning.  If brain ok, consider MR/CT cervical spine to evaluate for cervical stenosis.  Pt d/w Dr. Josephine Cables ([facility]) for admission.   Final Clinical Impression(s) / ED Diagnoses Final diagnoses:  Hypokalemia  Hypomagnesemia  Left facial numbness  Arm numbness left    Rx / DC Orders ED Discharge Orders    None       Isla Pence, MD 04/28/20 2316

## 2020-04-28 NOTE — Consult Note (Signed)
[facility] Tele[facility]urology Consult Services  Stat Consult  Date of Service:   04/28/2020 21:43:31  Impression:       R29.810 - Facial numbness/ Facial weakness  Comments/Sign-Out: 54 year old male with a past medical HTN presents with symptoms of left peri-oral numbness and left arm numbness that started Friday. DDX cervical radiculopathy, vs ischemia, recommend admission for MRI brain to rule out stroke, if MRI is negative for acute ischemia consider MRI/CT of C spine to evaluate for cervical stenosis.   Our recommendations are outlined below.  Laboratory Studies: Recommend Lipid panel Hemoglobin A1c  Nursing Recommendations: Telemetry, IV Fluids, avoid dextrose containing fluids, Maintain euglycemia Neuro checks q4 hrs x 24 hrs and then per shift Head of bed 30 degrees  Consultations: Recommend Speech therapy if failed dysphagia screen Physical therapy/Occupational therapy  Additional Recommendations:    Metrics: [facility] Notification Time: 04/28/2020 21:41:48 Stamp Time: 04/28/2020 21:43:31 Callback Response Time: 04/28/2020 21:43:56   ----------------------------------------------------------------------------------------------------  Chief Complaint: Left arm numbness and and left facial numbness.  History of Present Illness: Patient is a 54 year old Male.  54 year old male with a past medical HTN presents with symptoms of left peri-oral numbness and left arm numbness that started Friday. Patient states that he numbness radiating from his left shoulder down to his fingertips. He denies neck pain. He denies any other deficits.   Past Medical History:      Hypertension      There is NO history of Diabetes Mellitus      There is NO history of Hyperlipidemia      There is NO history of Coronary Artery Disease      There is NO history of Stroke  Anticoagulant use:  No  Antiplatelet use: No    Examination: BP(147/79),  Pulse(98), 1A: Level of Consciousness - Alert; keenly responsive + 0 1B: Ask Month and Age - Both Questions Right + 0 1C: Blink Eyes & Squeeze Hands - Performs Both Tasks + 0 2: Test Horizontal Extraocular Movements - Normal + 0 3: Test Visual Fields - No Visual Loss + 0 4: Test Facial Palsy (Use Grimace if Obtunded) - Normal symmetry + 0 5A: Test Left Arm Motor Drift - No Drift for 10 Seconds + 0 5B: Test Right Arm Motor Drift - No Drift for 10 Seconds + 0 6A: Test Left Leg Motor Drift - No Drift for 5 Seconds + 0 6B: Test Right Leg Motor Drift - No Drift for 5 Seconds + 0 7: Test Limb Ataxia (FNF/Heel-Shin) - No Ataxia + 0 8: Test Sensation - Normal; No sensory loss + 0 9: Test Language/Aphasia - Normal; No aphasia + 0 10: Test Dysarthria - Normal + 0 11: Test Extinction/Inattention - No abnormality + 0  NIHSS Score: 0   Patient/Family was informed the Neurology Consult would occur via TeleHealth consult by way of interactive audio and video telecommunications and consented to receiving care in this manner.  Patient is being evaluated for possible acute neurologic impairment and high probability of imminent or life-threatening deterioration. I spent total of 30 minutes providing care to this patient, including time for face to face visit via telemedicine, review of medical records, imaging studies and discussion of findings with providers, the patient and/or family.   Dr Jessica Priest   [facility] [phone]  Case [id number]

## 2020-04-28 NOTE — ED Triage Notes (Signed)
Chest pain onset 2 days ago, states he did not come in earlier because he had no insurance and no transportation

## 2020-04-29 ENCOUNTER — Observation Stay (HOSPITAL_COMMUNITY): Payer: Self-pay

## 2020-04-29 ENCOUNTER — Encounter (HOSPITAL_COMMUNITY): Payer: Self-pay | Admitting: Internal Medicine

## 2020-04-29 ENCOUNTER — Other Ambulatory Visit: Payer: Self-pay

## 2020-04-29 ENCOUNTER — Observation Stay (HOSPITAL_BASED_OUTPATIENT_CLINIC_OR_DEPARTMENT_OTHER): Payer: Self-pay

## 2020-04-29 DIAGNOSIS — E876 Hypokalemia: Secondary | ICD-10-CM

## 2020-04-29 DIAGNOSIS — I10 Essential (primary) hypertension: Secondary | ICD-10-CM

## 2020-04-29 DIAGNOSIS — I6389 Other cerebral infarction: Secondary | ICD-10-CM

## 2020-04-29 DIAGNOSIS — I639 Cerebral infarction, unspecified: Secondary | ICD-10-CM | POA: Diagnosis present

## 2020-04-29 DIAGNOSIS — K219 Gastro-esophageal reflux disease without esophagitis: Secondary | ICD-10-CM

## 2020-04-29 DIAGNOSIS — R9431 Abnormal electrocardiogram [ECG] [EKG]: Secondary | ICD-10-CM

## 2020-04-29 DIAGNOSIS — R29810 Facial weakness: Secondary | ICD-10-CM

## 2020-04-29 DIAGNOSIS — R200 Anesthesia of skin: Secondary | ICD-10-CM

## 2020-04-29 LAB — COMPREHENSIVE METABOLIC PANEL
ALT: 24 U/L (ref 0–44)
ALT: 25 U/L (ref 0–44)
AST: 44 U/L — ABNORMAL HIGH (ref 15–41)
AST: 40 U/L (ref 15–41)
Albumin: 3.3 g/dL — ABNORMAL LOW (ref 3.5–5.0)
Albumin: 3.4 g/dL — ABNORMAL LOW (ref 3.5–5.0)
Alkaline Phosphatase: 59 U/L (ref 38–126)
Alkaline Phosphatase: 61 U/L (ref 38–126)
Anion gap: 10 (ref 5–15)
Anion gap: 11 (ref 5–15)
BUN: 5 mg/dL — ABNORMAL LOW (ref 6–20)
BUN: 7 mg/dL (ref 6–20)
CO2: 28 mmol/L (ref 22–32)
CO2: 27 mmol/L (ref 22–32)
Calcium: 7.9 mg/dL — ABNORMAL LOW (ref 8.9–10.3)
Calcium: 8.2 mg/dL — ABNORMAL LOW (ref 8.9–10.3)
Chloride: 99 mmol/L (ref 98–111)
Chloride: 100 mmol/L (ref 98–111)
Creatinine, Ser: 0.91 mg/dL (ref 0.61–1.24)
Creatinine, Ser: 1.06 mg/dL (ref 0.61–1.24)
GFR, Estimated: >60 mL/min (ref >60)
GFR, Estimated: >60 mL/min (ref >60)
Glucose, Bld: 101 mg/dL — ABNORMAL HIGH (ref 70–99)
Glucose, Bld: 106 mg/dL — ABNORMAL HIGH (ref 70–99)
Potassium: 2.9 mmol/L — ABNORMAL LOW (ref 3.5–5.1)
Potassium: 3.3 mmol/L — ABNORMAL LOW (ref 3.5–5.1)
Sodium: 137 mmol/L (ref 135–145)
Sodium: 138 mmol/L (ref 135–145)
Total Bilirubin: 1.2 mg/dL (ref 0.3–1.2)
Total Bilirubin: 1.6 mg/dL — ABNORMAL HIGH (ref 0.3–1.2)
Total Protein: 6.1 g/dL — ABNORMAL LOW (ref 6.5–8.1)
Total Protein: 6.3 g/dL — ABNORMAL LOW (ref 6.5–8.1)

## 2020-04-29 LAB — MAGNESIUM
Magnesium: 2.1 mg/dL (ref 1.7–2.4)
Magnesium: 1.6 mg/dL — ABNORMAL LOW (ref 1.7–2.4)

## 2020-04-29 LAB — CBC
HCT: 32.1 % — ABNORMAL LOW (ref 39.0–52.0)
HCT: 32.3 % — ABNORMAL LOW (ref 39.0–52.0)
Hemoglobin: 11.5 g/dL — ABNORMAL LOW (ref 13.0–17.0)
Hemoglobin: 11.8 g/dL — ABNORMAL LOW (ref 13.0–17.0)
MCH: 40.8 pg — ABNORMAL HIGH (ref 26.0–34.0)
MCH: 41.4 pg — ABNORMAL HIGH (ref 26.0–34.0)
MCHC: 35.8 g/dL (ref 30.0–36.0)
MCHC: 36.5 g/dL — ABNORMAL HIGH (ref 30.0–36.0)
MCV: 113.8 fL — ABNORMAL HIGH (ref 80.0–100.0)
MCV: 113.3 fL — ABNORMAL HIGH (ref 80.0–100.0)
Platelets: 129 10*3/uL — ABNORMAL LOW (ref 150–400)
Platelets: 130 10*3/uL — ABNORMAL LOW (ref 150–400)
RBC: 2.82 MIL/uL — ABNORMAL LOW (ref 4.22–5.81)
RBC: 2.85 MIL/uL — ABNORMAL LOW (ref 4.22–5.81)
RDW: 14.9 % (ref 11.5–15.5)
RDW: 14.9 % (ref 11.5–15.5)
WBC: 2.4 10*3/uL — ABNORMAL LOW (ref 4.0–10.5)
WBC: 2.8 10*3/uL — ABNORMAL LOW (ref 4.0–10.5)
nRBC: 0 % (ref 0.0–0.2)
nRBC: 0 % (ref 0.0–0.2)

## 2020-04-29 LAB — HIV ANTIBODY (ROUTINE TESTING W REFLEX): HIV Screen 4th Generation wRfx: NONREACTIVE

## 2020-04-29 LAB — PHOSPHORUS
Phosphorus: 2.9 mg/dL (ref 2.5–4.6)
Phosphorus: 2.9 mg/dL (ref 2.5–4.6)

## 2020-04-29 LAB — ECHOCARDIOGRAM COMPLETE
AR max vel: 2.52 cm2
AV Area VTI: 2.48 cm2
AV Area mean vel: 2.4 cm2
AV Mean grad: 4.8 mmHg
AV Peak grad: 9.9 mmHg
Ao pk vel: 1.57 m/s
Area-P 1/2: 4.33 cm2
Height: 69 in
S' Lateral: 2.77 cm
Weight: 2528 oz

## 2020-04-29 LAB — PROTIME-INR
INR: 1.1 (ref 0.8–1.2)
Prothrombin Time: 13.7 seconds (ref 11.4–15.2)

## 2020-04-29 LAB — APTT: aPTT: 26 seconds (ref 24–36)

## 2020-04-29 LAB — LIPID PANEL
Cholesterol: 146 mg/dL (ref 0–200)
HDL: 24 mg/dL — ABNORMAL LOW (ref >40)
LDL Cholesterol: 49 mg/dL (ref 0–99)
Total CHOL/HDL Ratio: 6.1 RATIO
Triglycerides: 366 mg/dL — ABNORMAL HIGH (ref <150)
VLDL: 73 mg/dL — ABNORMAL HIGH (ref 0–40)

## 2020-04-29 LAB — VITAMIN B12: Vitamin B-12: 136 pg/mL — ABNORMAL LOW (ref 180–914)

## 2020-04-29 LAB — HEMOGLOBIN A1C
Hgb A1c MFr Bld: 5.2 % (ref 4.8–5.6)
Mean Plasma Glucose: 103 mg/dL

## 2020-04-29 LAB — TSH: TSH: 1.904 u[IU]/mL (ref 0.350–4.500)

## 2020-04-29 MED ORDER — PANTOPRAZOLE SODIUM 40 MG PO TBEC
40.0000 mg | DELAYED_RELEASE_TABLET | Freq: Every day | ORAL | Status: DC
  Administered 2020-04-29 – 2020-04-30 (×2): 40 mg via ORAL
  Filled 2020-04-29 (×2): qty 1

## 2020-04-29 MED ORDER — ASPIRIN 325 MG PO TABS
325.0000 mg | ORAL_TABLET | Freq: Every day | ORAL | Status: DC
  Administered 2020-04-29 – 2020-04-30 (×2): 325 mg via ORAL
  Filled 2020-04-29 (×2): qty 1

## 2020-04-29 MED ORDER — POTASSIUM CHLORIDE 10 MEQ/100ML IV SOLN
10.0000 meq | INTRAVENOUS | Status: AC
  Administered 2020-04-29 (×2): 10 meq via INTRAVENOUS
  Filled 2020-04-29 (×2): qty 100

## 2020-04-29 MED ORDER — ATORVASTATIN CALCIUM 40 MG PO TABS: 40.0000 mg | ORAL_TABLET | Freq: Every day | ORAL | Status: DC

## 2020-04-29 MED ORDER — THIAMINE HCL 100 MG PO TABS
100.0000 mg | ORAL_TABLET | Freq: Every day | ORAL | Status: DC
  Administered 2020-04-29 – 2020-04-30 (×2): 100 mg via ORAL
  Filled 2020-04-29 (×2): qty 1

## 2020-04-29 MED ORDER — MAGNESIUM SULFATE 2 GM/50ML IV SOLN
2.0000 g | Freq: Once | INTRAVENOUS | Status: AC
  Administered 2020-04-29: 2 g via INTRAVENOUS
  Filled 2020-04-29: qty 50

## 2020-04-29 MED ORDER — THIAMINE HCL 100 MG/ML IJ SOLN: 100.0000 mg | Freq: Every day | INTRAMUSCULAR | Status: DC

## 2020-04-29 MED ORDER — ADULT MULTIVITAMIN W/MINERALS CH
1.0000 | ORAL_TABLET | Freq: Every day | ORAL | Status: DC
  Administered 2020-04-29 – 2020-04-30 (×2): 1 via ORAL
  Filled 2020-04-29 (×2): qty 1

## 2020-04-29 MED ORDER — LORAZEPAM 1 MG PO TABS
1.0000 mg | ORAL_TABLET | ORAL | Status: DC | PRN
  Administered 2020-04-29: 2 mg via ORAL
  Administered 2020-04-29: 1 mg via ORAL
  Filled 2020-04-29: qty 2
  Filled 2020-04-29: qty 1

## 2020-04-29 MED ORDER — CLOPIDOGREL BISULFATE 75 MG PO TABS
75.0000 mg | ORAL_TABLET | Freq: Every day | ORAL | Status: DC
  Administered 2020-04-30: 75 mg via ORAL
  Filled 2020-04-29: qty 1

## 2020-04-29 MED ORDER — CHLORHEXIDINE GLUCONATE CLOTH 2 % EX PADS: 6.0000 | MEDICATED_PAD | Freq: Once | CUTANEOUS | Status: DC

## 2020-04-29 MED ORDER — LORAZEPAM 2 MG/ML IJ SOLN: 1.0000 mg | INTRAMUSCULAR | Status: DC | PRN

## 2020-04-29 MED ORDER — POTASSIUM CHLORIDE CRYS ER 20 MEQ PO TBCR
40.0000 meq | EXTENDED_RELEASE_TABLET | Freq: Once | ORAL | Status: AC
  Administered 2020-04-29: 40 meq via ORAL
  Filled 2020-04-29: qty 2

## 2020-04-29 MED ORDER — ACETAMINOPHEN 325 MG PO TABS
650.0000 mg | ORAL_TABLET | Freq: Four times a day (QID) | ORAL | Status: DC | PRN
  Administered 2020-04-29: 650 mg via ORAL
  Filled 2020-04-29: qty 2

## 2020-04-29 MED ORDER — MUSCLE RUB 10-15 % EX CREA
1.0000 "application " | TOPICAL_CREAM | CUTANEOUS | Status: DC | PRN
  Administered 2020-04-29: 1 via TOPICAL
  Filled 2020-04-29 (×2): qty 85

## 2020-04-29 MED ORDER — NICOTINE 14 MG/24HR TD PT24
14.0000 mg | MEDICATED_PATCH | Freq: Every day | TRANSDERMAL | Status: DC
  Administered 2020-04-29 – 2020-04-30 (×2): 14 mg via TRANSDERMAL
  Filled 2020-04-29 (×3): qty 1

## 2020-04-29 MED ORDER — ATORVASTATIN CALCIUM 40 MG PO TABS
80.0000 mg | ORAL_TABLET | Freq: Once | ORAL | Status: AC
  Administered 2020-04-29: 80 mg via ORAL
  Filled 2020-04-29: qty 2

## 2020-04-29 MED ORDER — FOLIC ACID 1 MG PO TABS
1.0000 mg | ORAL_TABLET | Freq: Every day | ORAL | Status: DC
  Administered 2020-04-29 – 2020-04-30 (×2): 1 mg via ORAL
  Filled 2020-04-29 (×2): qty 1

## 2020-04-29 MED ORDER — IOHEXOL 350 MG/ML SOLN
100.0000 mL | Freq: Once | INTRAVENOUS | Status: AC | PRN
  Administered 2020-04-29: 75 mL via INTRAVENOUS

## 2020-04-29 NOTE — ED Notes (Signed)
Report called the Gabriel Koch

## 2020-04-29 NOTE — H&P (Signed)
History and Physical  [name] IWL:[id number] DOB: [birth year] DOA: 04/28/2020  Referring physician: Isla Pence, MD PCP: Redmond School, MD  Patient coming from: Home  Chief Complaint: Left facial and left hand numbness  HPI: Gabriel Koch is a 54 y.o. male with medical history significant for hypertension who presents to the emergency department due to 2-day onset of left-sided facial and left upper extremity numbness.  Numbness was consistent since onset, he also referred to left knee weakness (but this appears to be chronic).  Patient was able to maintain communication, walk and swallow normally.  He complains of occasional sensation of weakness of left eyelid, but denies facial drooling.  ED Course:  In the emergency department, he was hemodynamically stable.  Work-up in the ED showed hypokalemia, hypomagnesemia, troponin x2 was negative.  CT head without contrast showed no acute intracranial findings.  Chest x-ray showed no active cardiopulmonary disease.  Teleneurology was consulted and recommended further stroke work-up with MRI brain in the morning to rule out stroke and MRI/CT of C-spine to evaluate for cervical stenosis if MRI of brain is negative.  Magnesium and potassium were replenished.  Hospitalist was asked to admit patient for further evaluation and management.  Review of Systems: Constitutional: Negative for chills and fever.  HENT: Negative for ear pain and sore throat.   Eyes: Positive for occasional sensation of weakness of left eyelid.   Respiratory: Negative for cough, chest tightness and shortness of breath.   Cardiovascular: Negative for chest pain and palpitations.  Gastrointestinal: Negative for abdominal pain and vomiting.  Endocrine: Negative for polyphagia and polyuria.  Genitourinary: Negative for decreased urine volume, dysuria, enuresis Musculoskeletal: Negative for arthralgias and back pain.  Skin: Negative for color change and rash.   Allergic/Immunologic: Negative for immunocompromised state.  Neurological: Positive for left-sided facial weakness and left upper extremity weakness and numbness.  Negative for tremors, syncope, speech difficulty,  Hematological: Does not bruise/bleed easily.  All other systems reviewed and are negative    Past Medical History:  Diagnosis Date  . Alcohol abuse   . Anxiety and depression   . Blood transfusion   . Complication of anesthesia    patient hemorrrhaged about 2 weeks after tonsillecotmy  . GERD (gastroesophageal reflux disease)   . Hypertension    Past Surgical History:  Procedure Laterality Date  . APPENDECTOMY    . BIOPSY  04/02/2020   Procedure: BIOPSY;  Surgeon: Eloise Harman, DO;  [facility];  Service: Endoscopy;;  . COLONOSCOPY WITH PROPOFOL N/A 04/02/2020   Procedure: COLONOSCOPY WITH PROPOFOL;  Surgeon: Eloise Harman, DO;  [facility];  Service: Endoscopy;  Laterality: N/A;  8:30am  . ESOPHAGOGASTRODUODENOSCOPY (EGD) WITH PROPOFOL N/A 04/02/2020   Procedure: ESOPHAGOGASTRODUODENOSCOPY (EGD) WITH PROPOFOL;  Surgeon: Eloise Harman, DO;  [facility];  Service: Endoscopy;  Laterality: N/A;  . POLYPECTOMY  04/02/2020   Procedure: POLYPECTOMY;  Surgeon: Eloise Harman, DO;  [facility];  Service: Endoscopy;;  . TONSILLECTOMY      Social History:  reports that he has been smoking cigarettes. He has a 35.00 pack-year smoking history. He has never used smokeless tobacco. He reports current alcohol use of about 6.0 standard drinks of alcohol per week. He reports that he does not use drugs.   Allergies  Allergen Reactions  . Bee Venom     Family History  Problem Relation Age of Onset  . Colon cancer Neg Hx  Pt knows little about family history    Prior to Admission medications   Medication Sig Start Date End Date Taking? Authorizing Provider  acetaminophen (TYLENOL) 325 MG tablet Take 650 mg by  mouth every 6 (six) hours as needed.   Yes [provider]  Alum & Mag Hydroxide-Simeth (MYLANTA PO) Take by mouth as needed.   Yes [provider]  amLODipine (NORVASC) 5 MG tablet Take 5 mg by mouth daily.   Yes [provider]  esomeprazole (NEXIUM) 40 MG capsule Take 1 capsule (40 mg total) by mouth in the morning and at bedtime. 04/02/20 09/29/20 Yes Carver, Elon Alas, DO  HYDROcodone-acetaminophen (NORCO) 10-325 MG tablet Take 1 tablet by mouth 4 (four) times daily as needed.   Yes [provider]  losartan (COZAAR) 50 MG tablet Take 50 mg by mouth daily. 04/22/20  Yes [provider]  Menthol, Topical Analgesic, (ICY HOT EX) Apply topically as needed.   Yes [provider]  potassium chloride SA (KLOR-CON) 20 MEQ tablet Take 20 mEq by mouth in the morning, at noon, in the evening, and at bedtime. Taking daily.   Yes [provider]  tiZANidine (ZANAFLEX) 4 MG tablet Take 4 mg by mouth daily.   Yes [provider]  traZODone (DESYREL) 100 MG tablet Take 100 mg by mouth 2 (two) times daily.   Yes [provider]  trolamine salicylate (ASPERCREME) 10 % cream Apply 1 application topically as needed for muscle pain.   Yes [provider]  busPIRone (BUSPAR) 10 MG tablet Take 10 mg by mouth 2 (two) times daily.    [provider]    Physical Exam: BP (!) 148/79   Pulse 96   Temp 98.2 F (36.8 C) (Oral)   Resp 20   Ht 5\' 9"  (1.753 m)   Wt 71.7 kg   SpO2 98%   BMI 23.33 kg/m   . General: 54 y.o. year-old male well developed well nourished in no acute distress.  Alert and oriented x3. Marland Kitchen HEENT: NCAT, EOMI . Neck: Supple, tracheal medial . Cardiovascular: Regular rate and rhythm with no rubs or gallops.  No thyromegaly or JVD noted.  No lower extremity edema. 2/4 pulses in all 4 extremities. Marland Kitchen Respiratory: Clear to auscultation with no wheezes or rales. Good inspiratory effort. . Abdomen: Soft  nontender nondistended with normal bowel sounds x4 quadrants. . Muskuloskeletal: No cyanosis, clubbing or edema noted bilaterally . Neuro: CN II-XII intact, strength, sensation, reflexes intact . Skin: No ulcerative lesions noted or rashes . Psychiatry: Judgement and insight appear normal. Mood is appropriate for condition and setting          Labs on Admission:  Basic Metabolic Panel: Recent Labs  Lab 04/28/20 1709 04/28/20 1855  NA 137  --   K 2.7*  --   CL 94*  --   CO2 30  --   GLUCOSE 100*  --   BUN 5*  --   CREATININE 0.96  --   CALCIUM 8.4*  --   MG  --  1.2*   Liver Function Tests: No results for input(s): AST, ALT, ALKPHOS, BILITOT, PROT, ALBUMIN in the last 168 hours. No results for input(s): LIPASE, AMYLASE in the last 168 hours. No results for input(s): AMMONIA in the last 168 hours. CBC: Recent Labs  Lab 04/28/20 1709  WBC 4.5  HGB 13.0  HCT 35.7*  MCV 111.9*  PLT 166   Cardiac Enzymes: No results for input(s):  CKTOTAL, CKMB, CKMBINDEX, TROPONINI in the last 168 hours.  BNP (last 3 results) No results for input(s): BNP in the last 8760 hours.  ProBNP (last 3 results) No results for input(s): PROBNP in the last 8760 hours.  CBG: No results for input(s): GLUCAP in the last 168 hours.  Radiological Exams on Admission: DG Chest 2 View  Result Date: 04/28/2020 CLINICAL DATA:  Chest pain. EXAM: CHEST - 2 VIEW COMPARISON:  Dec 02, 2018 FINDINGS: The heart size and mediastinal contours are within normal limits. Both lungs are clear. Chronic eighth and ninth right rib fractures are seen. IMPRESSION: No active cardiopulmonary disease. Electronically Signed   By: Virgina Norfolk M.D.   On: 04/28/2020 18:07   CT Head Wo Contrast  Result Date: 04/28/2020 CLINICAL DATA:  Neurologic deficit. EXAM: CT HEAD WITHOUT CONTRAST TECHNIQUE: Contiguous axial images were obtained from the base of the skull through the vertex without intravenous contrast. COMPARISON:   Brain MRI 02/09/2009 FINDINGS: Brain: No acute intracranial hemorrhage. No focal mass lesion. No CT evidence of acute infarction. No midline shift or mass effect. No hydrocephalus. Basilar cisterns are patent. Mild cortical atrophy. Vascular: No hyperdense vessel or unexpected calcification. Skull: Normal. Negative for fracture or focal lesion. Sinuses/Orbits: Paranasal sinuses and mastoid air cells are clear. Orbits are clear. Other: None. IMPRESSION: No acute intracranial findings. Electronically Signed   By: Suzy Bouchard M.D.   On: 04/28/2020 19:46    EKG: I independently viewed the EKG done and my findings are as followed: Sinus tachycardia with rate of 103 bpm with RBBB and prolonged QTc (53ms)   Assessment/Plan Present on Admission: . Facial weakness . HTN (hypertension) . GERD (gastroesophageal reflux disease)  Principal Problem:   Facial weakness Active Problems:   GERD (gastroesophageal reflux disease)   HTN (hypertension)   Left arm numbness   Prolonged QT interval  Left-sided facial and left upper extremity weakness and numbness R/O acute ischemic stroke/cervical stenosis CT head without contrast showed no acute intracranial findings.   Teleneurologist was consulted and recommended MRI of brain without contrast to rule out acute ischemic stroke. Consider MRI/CT of C-spine to evaluate for cervical stenosis if MRI of brain is negative Patient will be admitted to telemetry unit  Continue fall precautions and neuro checks Lipid panel and hemoglobin A1c will be checked Continue PT/OT eval and treat Bedside swallow eval by nursing prior to diet Neurologist will be consulted for further evaluation and recommendation.  Prolonged QT interval QTc 549ms K+ was 2.7, Mg was 1.2; these were replenished Calcium was 8.4, but albumin was not checked to correct for calcium level. Albumin level will be checked.  Avoid QT prolonging drugs Continue telemetry  Electrolyte  abnormalities K+ 2.7-repleted Mg 1.2-repleted  GERD Continue Protonix  Hypertension BP at 148/79, permissive hypertension will be allowed at this time   DVT prophylaxis: SCDs  Code Status: Full code  Family Communication: None at bedside  Disposition Plan:  Patient is from:                        home Anticipated DC to:                   home Anticipated DC date:               1 day Anticipated DC barriers:         Patient is unstable to be discharged at this time due to left-sided facial and left  upper extremity numbness and weakness with need for further stroke work-up   Consults called: Neurology  Admission status: Observation  Bernadette Hoit MD [facility]  04/29/2020, 12:57 AM

## 2020-04-29 NOTE — Progress Notes (Signed)
*  PRELIMINARY RESULTS* Echocardiogram 2D Echocardiogram has been performed.  Gabriel Koch 04/29/2020, 2:57 PM

## 2020-04-29 NOTE — Consult Note (Signed)
[place] A. Merlene Laughter, MD     [email or web address]          Gabriel Koch is an 54 y.o. male.   ASSESSMENT/PLAN: 1.  Acute right thalamic infarct presented with gait instability, mild left upper extremity weakness and the left-sided numbness.  Risk factors hypertension, age, nicotine use and alcoholism.  This appears to be a lacunar infarct.  Dual antiplatelet agents are recommended for 1 month.  Subsequently, Plavix 75 mg is recommended.  Agree with the use of a statin.  Blood pressure control, cessation of alcohol and nicotine use is discussed with the patient.  Additional labs will be obtained. 2.  Poor dentition: Recommend following up with a dentist.   Patient is a 54 year old right-handed white male who presents with 3-day history of numbness and dysesthesia involving the left upper extremity.  The symptoms started about 3 days ago and progressed and he subsequently seek medical attention.  He reports he did have some gait instability but no falling is reported however.  The patient denies headaches.  He reports having gait instability and mild left upper extremity weakness.  He denies dizziness or dysarthria.  No dysphagia is reported.  He does not report having dyspnea or palpitation.  He did have mild retrosternal chest pain.  The review of systems otherwise negative.   GENERAL: He is doing well at this time in no acute distress.  HEENT: Neck is supple no trauma noted.  Dentition poor.  ABDOMEN: soft  EXTREMITIES: No edema   BACK: Normal  SKIN: Normal by inspection.    MENTAL STATUS: Alert and oriented - including orientation to his age and the month. Speech, language and cognition are generally intact. Judgment and insight normal.   CRANIAL NERVES: Pupils are equal, round and reactive to light and accomodation; extra ocular movements are full, there is no significant nystagmus; visual fields are full; upper and lower facial muscles are normal in  strength and symmetric, there is no flattening of the nasolabial folds; tongue is midline; uvula is midline; shoulder elevation is normal.  MOTOR: Normal tone, bulk and strength; no pronator drift.  There is no drift of the upper or lower extremities.  COORDINATION: Left finger to nose is normal, right finger to nose is normal, No rest tremor; no intention tremor; no postural tremor; no bradykinesia.  REFLEXES: Deep tendon reflexes are symmetrical and normal in the upper extremities but somewhat brisk in the right lower extremity. Plantar reflexes are flexor bilaterally.   SENSATION: Reduced sensation to light touch and temperature involving the left upper extremity.    NIH stroke scale 1.  Blood pressure (!) 149/78, pulse 90, temperature 98.5 F (36.9 C), temperature source Oral, resp. rate 18, height 5\' 9"  (1.753 m), weight 71.7 kg, SpO2 99 %.  Past Medical History:  Diagnosis Date  . Alcohol abuse   . Anxiety and depression   . Blood transfusion   . Complication of anesthesia    patient hemorrrhaged about 2 weeks after tonsillecotmy  . GERD (gastroesophageal reflux disease)   . Hypertension     Past Surgical History:  Procedure Laterality Date  . APPENDECTOMY    . BIOPSY  04/02/2020   Procedure: BIOPSY;  Surgeon: Eloise Harman, DO;  [facility];  Service: Endoscopy;;  . COLONOSCOPY WITH PROPOFOL N/A 04/02/2020   Procedure: COLONOSCOPY WITH PROPOFOL;  Surgeon: Eloise Harman, DO;  [facility];  Service: Endoscopy;  Laterality: N/A;  8:30am  . ESOPHAGOGASTRODUODENOSCOPY (  EGD) WITH PROPOFOL N/A 04/02/2020   Procedure: ESOPHAGOGASTRODUODENOSCOPY (EGD) WITH PROPOFOL;  Surgeon: Eloise Harman, DO;  [facility];  Service: Endoscopy;  Laterality: N/A;  . POLYPECTOMY  04/02/2020   Procedure: POLYPECTOMY;  Surgeon: Eloise Harman, DO;  [facility];  Service: Endoscopy;;  . TONSILLECTOMY      Family History  Problem Relation  Age of Onset  . Colon cancer Neg Hx        Pt knows little about family history    Social History:  reports that he has been smoking cigarettes. He has a 35.00 pack-year smoking history. He has never used smokeless tobacco. He reports current alcohol use of about 6.0 standard drinks of alcohol per week. He reports that he does not use drugs.  Allergies:  Allergies  Allergen Reactions  . Bee Venom     Medications: Prior to Admission medications   Medication Sig Start Date End Date Taking? Authorizing Provider  acetaminophen (TYLENOL) 325 MG tablet Take 650 mg by mouth every 6 (six) hours as needed.   Yes [provider]  Alum & Mag Hydroxide-Simeth (MYLANTA PO) Take by mouth as needed.   Yes [provider]  amLODipine (NORVASC) 5 MG tablet Take 5 mg by mouth daily.   Yes [provider]  esomeprazole (NEXIUM) 40 MG capsule Take 1 capsule (40 mg total) by mouth in the morning and at bedtime. 04/02/20 09/29/20 Yes Carver, Elon Alas, DO  HYDROcodone-acetaminophen (NORCO) 10-325 MG tablet Take 1 tablet by mouth 4 (four) times daily as needed.   Yes [provider]  losartan (COZAAR) 50 MG tablet Take 50 mg by mouth daily. 04/22/20  Yes [provider]  Menthol, Topical Analgesic, (ICY HOT EX) Apply topically as needed.   Yes [provider]  potassium chloride SA (KLOR-CON) 20 MEQ tablet Take 20 mEq by mouth in the morning, at noon, in the evening, and at bedtime. Taking daily.   Yes [provider]  tiZANidine (ZANAFLEX) 4 MG tablet Take 4 mg by mouth daily.   Yes [provider]  traZODone (DESYREL) 100 MG tablet Take 100 mg by mouth 2 (two) times daily.   Yes [provider]  trolamine salicylate (ASPERCREME) 10 % cream Apply 1 application topically as needed for muscle pain.   Yes [provider]  busPIRone (BUSPAR) 10 MG tablet Take 10 mg by mouth 2 (two) times daily.    [provider]     Scheduled Meds: . aspirin  325 mg Oral Daily  . Chlorhexidine Gluconate Cloth  6 each Topical Once   And  . Chlorhexidine Gluconate Cloth  6 each Topical Once  . folic acid  1 mg Oral Daily  . multivitamin with minerals  1 tablet Oral Daily  . nicotine  14 mg Transdermal Daily  . pantoprazole  40 mg Oral Daily  . thiamine  100 mg Oral Daily   Or  . thiamine  100 mg Intravenous Daily   Continuous Infusions: PRN Meds:.LORazepam **OR** LORazepam     Results for orders placed or performed during the hospital encounter of 04/28/20 (from the past 48 hour(s))  Basic metabolic panel     Status: Abnormal   Collection Time: 04/28/20  5:09 PM  Result Value Ref Range   Sodium 137 135 - 145 mmol/L   Potassium 2.7 (LL) 3.5 - 5.1 mmol/L    Comment: CRITICAL RESULT CALLED TO, READ BACK BY AND VERIFIED WITH: R HOLCOMB  AT 1758 ON 04/28/2020 BY MOSLEY,J    Chloride 94 (L) 98 - 111 mmol/L   CO2 30 22 - 32 mmol/L   Glucose, Bld 100 (H) 70 - 99 mg/dL    Comment: Glucose reference range applies only to samples taken after fasting for at least 8 hours.   BUN 5 (L) 6 - 20 mg/dL   Creatinine, Ser 0.96 0.61 - 1.24 mg/dL   Calcium 8.4 (L) 8.9 - 10.3 mg/dL   GFR, Estimated >60 >60 mL/min    Comment: (NOTE) Calculated using the CKD-EPI Creatinine Equation (2021)    Anion gap 13 5 - 15    Comment: Performed at [facility], [address], [place], [place] [address]  CBC     Status: Abnormal   Collection Time: 04/28/20  5:09 PM  Result Value Ref Range   WBC 4.5 4.0 - 10.5 K/uL   RBC 3.19 (L) 4.22 - 5.81 MIL/uL   Hemoglobin 13.0 13.0 - 17.0 g/dL   HCT 35.7 (L) 39 - 52 %   MCV 111.9 (H) 80.0 - 100.0 fL   MCH 40.8 (H) 26.0 - 34.0 pg   MCHC 36.4 (H) 30.0 - 36.0 g/dL   RDW 14.6 11.5 - 15.5 %   Platelets 166 150 - 400 K/uL   nRBC 0.4 (H) 0.0 - 0.2 %    Comment: Performed at [facility], [address], [place], [place] [address]  Troponin I (High Sensitivity)     Status: None   Collection  Time: 04/28/20  5:09 PM  Result Value Ref Range   Troponin I (High Sensitivity) 10 <18 ng/L    Comment: (NOTE) Elevated high sensitivity troponin I (hsTnI) values and significant  changes across serial measurements may suggest ACS but many other  chronic and acute conditions are known to elevate hsTnI results.  Refer to the Links section for chest pain algorithms and additional  guidance. Performed at [facility], [address], [place], [place] [address]   HIV Antibody (routine testing w rflx)     Status: None   Collection Time: 04/28/20  5:15 PM  Result Value Ref Range   HIV Screen 4th Generation wRfx Non Reactive Non Reactive    Comment: Performed at [facility], [place] [address], [place], [place] [address]  Troponin I (High Sensitivity)     Status: None   Collection Time: 04/28/20  6:55 PM  Result Value Ref Range   Troponin I (High Sensitivity) 10 <18 ng/L    Comment: (NOTE) Elevated high sensitivity troponin I (hsTnI) values and significant  changes across serial measurements may suggest ACS but many other  chronic and acute conditions are known to elevate hsTnI results.  Refer to the "Links" section for chest pain algorithms and additional  guidance. Performed at [facility], [address] [place], [place] [address]   Magnesium     Status: Abnormal   Collection Time: 04/28/20  6:55 PM  Result Value Ref Range   Magnesium 1.2 (L) 1.7 - 2.4 mg/dL    Comment: Performed at [facility], [address], [place], [place] [address]  Respiratory Panel by RT PCR (Flu A&B, Covid) - Nasopharyngeal Swab     Status: None   Collection Time: 04/28/20  9:28 PM   Specimen: Nasopharyngeal Swab  Result Value Ref Range   SARS Coronavirus 2 by RT PCR NEGATIVE NEGATIVE    Comment: (NOTE) SARS-CoV-2 target nucleic acids are NOT DETECTED.  The SARS-CoV-2 RNA is generally detectable  in upper respiratoy specimens during the acute phase of infection. The lowest concentration of SARS-CoV-2  viral copies this assay can detect is 131 copies/mL. A negative result does not preclude SARS-Cov-2 infection and should not be used as the sole basis for treatment or other patient management decisions. A negative result may occur with  improper specimen collection/handling, submission of specimen other than nasopharyngeal swab, presence of viral mutation(s) within the areas targeted by this assay, and inadequate number of viral copies (<131 copies/mL). A negative result must be combined with clinical observations, patient history, and epidemiological information. The expected result is Negative.  Fact Sheet for Patients:  PinkCheek.be  Fact Sheet for Healthcare Providers:  GravelBags.it  This test is no t yet approved or cleared by the [place] FDA and  has been authorized for detection and/or diagnosis of SARS-CoV-2 by FDA under an Emergency Use Authorization (EUA). This EUA will remain  in effect (meaning this test can be used) for the duration of the COVID-19 declaration under Section 564(b)(1) of the Act, 21 U.S.C. section 360bbb-3(b)(1), unless the authorization is terminated or revoked sooner.     Influenza A by PCR NEGATIVE NEGATIVE   Influenza B by PCR NEGATIVE NEGATIVE    Comment: (NOTE) The Xpert Xpress SARS-CoV-2/FLU/RSV assay is intended as an aid in  the diagnosis of influenza from Nasopharyngeal swab specimens and  should not be used as a sole basis for treatment. Nasal washings and  aspirates are unacceptable for Xpert Xpress SARS-CoV-2/FLU/RSV  testing.  Fact Sheet for Patients: PinkCheek.be  Fact Sheet for Healthcare Providers: GravelBags.it  This test is not yet approved or cleared by the [place] FDA and  has been authorized for detection and/or diagnosis of SARS-CoV-2 by  FDA under an Emergency Use Authorization (EUA). This EUA  will remain  in effect (meaning this test can be used) for the duration of the  Covid-19 declaration under Section 564(b)(1) of the Act, 21  U.S.C. section 360bbb-3(b)(1), unless the authorization is  terminated or revoked. Performed at [facility], [address] [place], [place] [address]   Lipid panel     Status: Abnormal   Collection Time: 04/29/20  4:46 AM  Result Value Ref Range   Cholesterol 146 0 - 200 mg/dL   Triglycerides 366 (H) <150 mg/dL   HDL 24 (L) >40 mg/dL   Total CHOL/HDL Ratio 6.1 RATIO   VLDL 73 (H) 0 - 40 mg/dL   LDL Cholesterol 49 0 - 99 mg/dL    Comment:        Total Cholesterol/HDL:CHD Risk Coronary Heart Disease Risk Table                     Men   Women  1/2 Average Risk   3.4   3.3  Average Risk       5.0   4.4  2 X Average Risk   9.6   7.1  3 X Average Risk  23.4   11.0        Use the calculated Patient Ratio above and the CHD Risk Table to determine the patient's CHD Risk.        ATP III CLASSIFICATION (LDL):  <100     mg/dL   Optimal  100-129  mg/dL   Near or Above                    Optimal  130-159  mg/dL   Borderline  160-189  mg/dL  High  >190     mg/dL   Very High Performed at [facility], [address], [place], [place] [address]   Comprehensive metabolic panel     Status: Abnormal   Collection Time: 04/29/20  4:46 AM  Result Value Ref Range   Sodium 137 135 - 145 mmol/L   Potassium 2.9 (L) 3.5 - 5.1 mmol/L   Chloride 99 98 - 111 mmol/L   CO2 28 22 - 32 mmol/L   Glucose, Bld 101 (H) 70 - 99 mg/dL    Comment: Glucose reference range applies only to samples taken after fasting for at least 8 hours.   BUN 5 (L) 6 - 20 mg/dL   Creatinine, Ser 0.91 0.61 - 1.24 mg/dL   Calcium 7.9 (L) 8.9 - 10.3 mg/dL   Total Protein 6.1 (L) 6.5 - 8.1 g/dL   Albumin 3.3 (L) 3.5 - 5.0 g/dL   AST 44 (H) 15 - 41 U/L   ALT 24 0 - 44 U/L   Alkaline Phosphatase 59 38 - 126 U/L   Total Bilirubin 1.2 0.3 - 1.2 mg/dL   GFR, Estimated >60 >60 mL/min     Comment: (NOTE) Calculated using the CKD-EPI Creatinine Equation (2021)    Anion gap 10 5 - 15    Comment: Performed at [facility], [address], [place], [place] [address]  CBC     Status: Abnormal   Collection Time: 04/29/20  4:46 AM  Result Value Ref Range   WBC 2.4 (L) 4.0 - 10.5 K/uL   RBC 2.82 (L) 4.22 - 5.81 MIL/uL   Hemoglobin 11.5 (L) 13.0 - 17.0 g/dL   HCT 32.1 (L) 39 - 52 %   MCV 113.8 (H) 80.0 - 100.0 fL   MCH 40.8 (H) 26.0 - 34.0 pg   MCHC 35.8 30.0 - 36.0 g/dL   RDW 14.9 11.5 - 15.5 %   Platelets 129 (L) 150 - 400 K/uL   nRBC 0.0 0.0 - 0.2 %    Comment: Performed at [facility], [address], [place], [place] [address]  Protime-INR     Status: None   Collection Time: 04/29/20  4:46 AM  Result Value Ref Range   Prothrombin Time 13.7 11.4 - 15.2 seconds   INR 1.1 0.8 - 1.2    Comment: (NOTE) INR goal varies based on device and disease states. Performed at [facility], [address] [place], [place] [address]   APTT     Status: None   Collection Time: 04/29/20  4:46 AM  Result Value Ref Range   aPTT 26 24 - 36 seconds    Comment: Performed at [facility], [address] [place], [place] [address]  Magnesium     Status: None   Collection Time: 04/29/20  4:46 AM  Result Value Ref Range   Magnesium 2.1 1.7 - 2.4 mg/dL    Comment: Performed at [facility], [address], [place], [place] [address]  Phosphorus     Status: None   Collection Time: 04/29/20  4:46 AM  Result Value Ref Range   Phosphorus 2.9 2.5 - 4.6 mg/dL    Comment: Performed at [facility], [address] [place],  [address]  Comprehensive metabolic panel     Status: Abnormal   Collection Time: 04/29/20  5:06 PM  Result Value Ref Range   Sodium 138 135 - 145 mmol/L   Potassium 3.3 (L) 3.5 - 5.1 mmol/L   Chloride 100 98 - 111 mmol/L  CO2 27 22 - 32 mmol/L   Glucose, Bld 106 (H) 70 - 99 mg/dL    Comment: Glucose reference range applies only to samples taken after fasting for at  least 8 hours.   BUN 7 6 - 20 mg/dL   Creatinine, Ser 1.06 0.61 - 1.24 mg/dL   Calcium 8.2 (L) 8.9 - 10.3 mg/dL   Total Protein 6.3 (L) 6.5 - 8.1 g/dL   Albumin 3.4 (L) 3.5 - 5.0 g/dL   AST 40 15 - 41 U/L   ALT 25 0 - 44 U/L   Alkaline Phosphatase 61 38 - 126 U/L   Total Bilirubin 1.6 (H) 0.3 - 1.2 mg/dL   GFR, Estimated >60 >60 mL/min    Comment: (NOTE) Calculated using the CKD-EPI Creatinine Equation (2021)    Anion gap 11 5 - 15    Comment: Performed at [facility], [address], [place], [place] [address]  Magnesium     Status: Abnormal   Collection Time: 04/29/20  5:06 PM  Result Value Ref Range   Magnesium 1.6 (L) 1.7 - 2.4 mg/dL    Comment: Performed at [facility], [address], [place], [place] [address]  Phosphorus     Status: None   Collection Time: 04/29/20  5:06 PM  Result Value Ref Range   Phosphorus 2.9 2.5 - 4.6 mg/dL    Comment: Performed at [facility], [address], [place], [place] [address]  CBC     Status: Abnormal   Collection Time: 04/29/20  5:06 PM  Result Value Ref Range   WBC 2.8 (L) 4.0 - 10.5 K/uL   RBC 2.85 (L) 4.22 - 5.81 MIL/uL   Hemoglobin 11.8 (L) 13.0 - 17.0 g/dL   HCT 32.3 (L) 39 - 52 %   MCV 113.3 (H) 80.0 - 100.0 fL   MCH 41.4 (H) 26.0 - 34.0 pg   MCHC 36.5 (H) 30.0 - 36.0 g/dL   RDW 14.9 11.5 - 15.5 %   Platelets 130 (L) 150 - 400 K/uL   nRBC 0.0 0.0 - 0.2 %    Comment: Performed at [facility], [address] [place], [place] [address]    Studies/Results:  BRAIN MRI MRA FINDINGS: MRI HEAD FINDINGS  The patient was unable to tolerate the full examination. As a result, the routine axial T2/FLAIR sequence, axial T1 sequence, axial T2* sequence and coronal T2 sequence could not be obtained.  Brain:  Mild generalized cerebral atrophy.  8 mm focus of restricted diffusion within the right thalamus consistent with acute infarction (series 5, image 17). Corresponding T2 hyperintensity at this site.  Mild multifocal  T2 hyperintensity within the cerebral white matter is nonspecific, but consistent with chronic small vessel ischemic disease.  No evidence of intracranial mass.  No extra-axial fluid collection.  No midline shift.  Vascular: Reported below.  Skull and upper cervical spine: No focal marrow lesion.  Sinuses/Orbits: Visualized orbits show no acute finding. No significant paranasal sinus disease at the imaged levels. Trace fluid within bilateral mastoid air cells.  MRA HEAD FINDINGS  Mildly motion degraded examination.  The intracranial internal carotid arteries are patent. The M1 middle cerebral arteries are patent without significant stenosis. No M2 proximal branch occlusion or high-grade proximal stenosis is identified. The anterior cerebral arteries are patent.  The visualized intracranial vertebral arteries are patent. The basilar artery is patent. The posterior cerebral arteries are patent. Apparent moderate to moderately severe stenosis within the left PCA at the P2/P3 junction. Posterior  communicating arteries are hypoplastic or absent bilaterally.  1-2 mm inferiorly projecting vascular protrusions arising from the supraclinoid internal carotid arteries bilaterally which may reflect infundibula or small aneurysms.  IMPRESSION: MRI brain:  1. Prematurely terminated examination as described. 2. 8 mm acute infarct within the right thalamus. 3. Mild generalized cerebral atrophy and chronic small vessel ischemic disease.  MRA head:  1. Mildly motion degraded examination. 2. No intracranial large vessel occlusion. 3. Apparent moderate to moderately severe focal stenosis within the left PCA at the P2/P3 junction. 4. 1-2 mm inferiorly projecting vascular protrusions arising from the supraclinoid internal carotid arteries bilaterally, which may reflect infundibula or small aneurysms.      HEAD NECK CTA FINDINGS: Aortic arch: Standard branching.  Imaged portion shows no evidence of aneurysm or dissection. Mild atherosclerotic changes aortic arch. No significant stenosis of the major arch vessel origins.  Right carotid system: Mixed density atherosclerotic plaque in the distal right carotid bulb/proximal cervical right ICA resulting in approximately 50% stenosis.  Left carotid system: Atherosclerotic changes along the left common carotid artery with mixed density plaques, without hemodynamically significant stenosis. Mild atherosclerotic changes at the left carotid bifurcation without hemodynamically significant stenosis.  Vertebral arteries: Small calcified plaque at the origin of the each vertebral artery without stenosis. The vertebral arteries have normal course and caliber throughout the neck and head. The left vertebral artery is dominant.  Skeleton: Degenerative changes of the cervical spine, more pronounced C5-6 where there is moderate left neural foraminal narrowing. No aggressive bone lesion. Periapical lucency at left mandibular second premolar tooth.  Other neck: Negative.  Upper chest: No acute findings.  IMPRESSION: 1. No occlusion of major neck arteries or evidence of dissection. 2. Mixed density atherosclerotic plaque in the distal right carotid bulb/proximal cervical right ICA resulting in approximately 50% stenosis. 3. Atherosclerotic plaques at the left carotid bifurcation and left common carotid artery without hemodynamically significant stenosis. 4. The vertebral arteries are patent throughout the neck and head.   TTE IMPRESSIONS    1. Left ventricular ejection fraction, by estimation, is 60 to 65%. The  left ventricle has normal function. The left ventricle has no regional  wall motion abnormalities. Left ventricular diastolic parameters are  consistent with Grade I diastolic  dysfunction (impaired relaxation).  2. Right ventricular systolic function is normal. The right ventricular    size is normal.  3. The mitral valve is normal in structure. No evidence of mitral valve  regurgitation. No evidence of mitral stenosis.  4. The aortic valve is tricuspid. Aortic valve regurgitation is not  visualized. No aortic stenosis is present.  5. The inferior vena cava is normal in size with greater than 50%  respiratory variability, suggesting right atrial pressure of 3 mmHg.      The brain MRI scan is reviewed in person. There is acute infarct involving the right thalamic area. There is a mild global atrophy. There is minimal white matter ischemic changes. No significant stenosis noted. The scan is limited due to intolerance of the test.    Shakeia Krus A. Merlene Laughter, M.D.  Diplomate, Tax adviser of Psychiatry and Neurology ( Neurology). 04/29/2020, 7:02 PM

## 2020-04-29 NOTE — Evaluation (Signed)
Physical Therapy Evaluation Patient Details Name: Gabriel Koch MRN: [id number] DOB: 1966-01-06 Today's Date: 04/29/2020   History of Present Illness  Gabriel Koch is a 54 y.o. male with medical history significant for hypertension who presents to the emergency department due to 2-day onset of left-sided facial and left upper extremity numbness.  Numbness was consistent since onset, he also referred to left knee weakness (but this appears to be chronic).  Patient was able to maintain communication, walk and swallow normally.  He complains of occasional sensation of weakness of left eyelid, but denies facial drooling.    Clinical Impression  Patient functioning at baseline for functional mobility and gait, other than c/o numbness left side of face and LUE.  Plan:  Patient discharged from physical therapy to care of nursing for ambulation daily as tolerated for length of stay.     Follow Up Recommendations No PT follow up;Supervision - Intermittent    Equipment Recommendations  None recommended by PT    Recommendations for Other Services       Precautions / Restrictions Precautions Precautions: None Restrictions Weight Bearing Restrictions: No      Mobility  Bed Mobility Overal bed mobility: Independent                  Transfers Overall transfer level: Independent                  Ambulation/Gait Ambulation/Gait assistance: Modified independent (Device/Increase time) Gait Distance (Feet): 100 Feet Assistive device: None Gait Pattern/deviations: WFL(Within Functional Limits) Gait velocity: slightly decreased   General Gait Details: demonstrates good return for ambulation in room and hallways without loss of balance  Stairs            Wheelchair Mobility    Modified Rankin (Stroke Patients Only)       Balance Overall balance assessment: No apparent balance deficits (not formally assessed)                                            Pertinent Vitals/Pain Pain Assessment: 0-10 Pain Score: 8  Pain Location: left side of face and left arm Pain Descriptors / Indicators: Numbness Pain Intervention(s): Limited activity within patient's tolerance;Monitored during session    Home Living Family/patient expects to be discharged to:: Private residence Living Arrangements: Other relatives Available Help at Discharge: Family;Available PRN/intermittently Type of Home: House Home Access: Ramped entrance     Home Layout: One level Home Equipment: Walker - 2 wheels;Cane - single point;Bedside commode;Shower seat      Prior Function Level of Independence: Independent         Comments: Hydrographic surveyor, does not drive     Hand Dominance   Dominant Hand: Right    Extremity/Trunk Assessment   Upper Extremity Assessment Upper Extremity Assessment: Defer to OT evaluation    Lower Extremity Assessment Lower Extremity Assessment: Overall WFL for tasks assessed    Cervical / Trunk Assessment Cervical / Trunk Assessment: Normal  Communication   Communication: No difficulties  Cognition Arousal/Alertness: Awake/alert Behavior During Therapy: WFL for tasks assessed/performed Overall Cognitive Status: Within Functional Limits for tasks assessed                                        General Comments  Exercises     Assessment/Plan    PT Assessment Patent does not need any further PT services  PT Problem List         PT Treatment Interventions      PT Goals (Current goals can be found in the Care Plan section)  Acute Rehab PT Goals Patient Stated Goal: return home with family to assist PT Goal Formulation: With patient Time For Goal Achievement: 04/29/20 Potential to Achieve Goals: Good    Frequency     Barriers to discharge        Co-evaluation               AM-PAC PT "6 Clicks" Mobility  Outcome Measure Help needed turning from your back to your  side while in a flat bed without using bedrails?: None Help needed moving from lying on your back to sitting on the side of a flat bed without using bedrails?: None Help needed moving to and from a bed to a chair (including a wheelchair)?: None Help needed standing up from a chair using your arms (e.g., wheelchair or bedside chair)?: None Help needed to walk in hospital room?: None Help needed climbing 3-5 steps with a railing? : None 6 Click Score: 24    End of Session   Activity Tolerance: Patient tolerated treatment well   Nurse Communication: Mobility status PT Visit Diagnosis: Unsteadiness on feet (R26.81);Other abnormalities of gait and mobility (R26.89);Muscle weakness (generalized) (M62.81)    Time: 8403-7543 PT Time Calculation (min) (ACUTE ONLY): 14 min   Charges:   PT Evaluation $PT Eval Low Complexity: 1 Low PT Treatments $Therapeutic Activity: 8-22 mins        9:57 AM, 04/29/20 Lonell Grandchild, MPT Physical Therapist with [facility] [phone] office [phone] mobile phone

## 2020-04-29 NOTE — Progress Notes (Signed)
°  Patient seen and evaluated, chart reviewed, please see EMR for updated orders. Please see full H&P dictated by admitting physician Dr. Josephine Cables for same date of service.   MRI Brain- 04/29/20 8 mm acute infarct within the right thalamus   Brief Summary:- 54 y.o. male with medical history significant for hypertension, alcohol and tobacco abuse admitted on 04/29/2020 after 2 days  of left-sided facial and left upper extremity numbness with MRI brain confirming acute right thalamic stroke  A/p 1)Acute Right Thalamic Stroke--- MRI brain findings confirms acute stroke, -MRA head and CTA neck without LVO LDL 49, HDL 24, triglycerides 366 with a total cholesterol of 146 Even though  his LDL is within desired limits, patient should still take Lipitor/Statin for it's Pleiotropic effects (beyond cholesterol lowering benefits) given acute stroke -A1c pending -Echo with preserved EF of 60 to 65% without wall motion abnormalities, patient does have grade 1 diastolic dysfunction, no significant valvular abnormalities -PT eval appreciated -Treat empirically with aspirin and Lipitor -Await in-house neurology input -BP elevated, allow some permissive hypertension  2) hypokalemia/hypomagnesemia in an alcoholic male--- replace and recheck  3)Alcohol abuse--lorazepam per CIWA protocol, multivitamin folic acid and thiamine as ordered  4) tobacco abuse--nicotine patch as advised  5) prolonged QT interval--in the setting of hypomagnesemia and hypokalemia replaced  Dispo--attempted to discharge patient home on 04/29/20- patient complains of significant left-sided numbness and paresthesia, patient states he feels like his gait is not steady enough to be home alone at this time -Patient is requesting discharge home on 04/30/2020 when his aunt can be home to help him  --Total care time is about 47 minutes  - Patient seen and evaluated, chart reviewed, please see EMR for updated orders. Please see full H&P  dictated by admitting physician Dr. Josephine Cables for same date of service.

## 2020-04-30 DIAGNOSIS — Z9111 Patient's noncompliance with dietary regimen: Secondary | ICD-10-CM

## 2020-04-30 DIAGNOSIS — Z720 Tobacco use: Secondary | ICD-10-CM | POA: Diagnosis present

## 2020-04-30 DIAGNOSIS — Z9114 Patient's other noncompliance with medication regimen: Secondary | ICD-10-CM

## 2020-04-30 DIAGNOSIS — F1010 Alcohol abuse, uncomplicated: Secondary | ICD-10-CM | POA: Diagnosis present

## 2020-04-30 LAB — RENAL FUNCTION PANEL
Albumin: 3.1 g/dL — ABNORMAL LOW (ref 3.5–5.0)
Anion gap: 8 (ref 5–15)
BUN: 8 mg/dL (ref 6–20)
CO2: 27 mmol/L (ref 22–32)
Calcium: 7.7 mg/dL — ABNORMAL LOW (ref 8.9–10.3)
Chloride: 101 mmol/L (ref 98–111)
Creatinine, Ser: 0.91 mg/dL (ref 0.61–1.24)
GFR, Estimated: >60 mL/min (ref >60)
Glucose, Bld: 94 mg/dL (ref 70–99)
Phosphorus: 3 mg/dL (ref 2.5–4.6)
Potassium: 3.2 mmol/L — ABNORMAL LOW (ref 3.5–5.1)
Sodium: 136 mmol/L (ref 135–145)

## 2020-04-30 LAB — MAGNESIUM: Magnesium: 1.5 mg/dL — ABNORMAL LOW (ref 1.7–2.4)

## 2020-04-30 MED ORDER — ATORVASTATIN CALCIUM 40 MG PO TABS: 40.0000 mg | ORAL_TABLET | Freq: Every day | ORAL | 11 refills | Status: DC

## 2020-04-30 MED ORDER — BUSPIRONE HCL 10 MG PO TABS: 10.0000 mg | ORAL_TABLET | Freq: Two times a day (BID) | ORAL | 3 refills | Status: DC

## 2020-04-30 MED ORDER — MAGNESIUM SULFATE 4 GM/100ML IV SOLN
4.0000 g | Freq: Once | INTRAVENOUS | Status: AC
  Administered 2020-04-30: 4 g via INTRAVENOUS
  Filled 2020-04-30: qty 100

## 2020-04-30 MED ORDER — THIAMINE HCL 100 MG PO TABS
100.0000 mg | ORAL_TABLET | Freq: Every day | ORAL | 3 refills | Status: DC
Start: 2020-05-01 — End: 2020-10-24

## 2020-04-30 MED ORDER — CLOPIDOGREL BISULFATE 75 MG PO TABS
75.0000 mg | ORAL_TABLET | Freq: Every day | ORAL | 11 refills | Status: DC
Start: 2020-05-01 — End: 2020-10-24

## 2020-04-30 MED ORDER — NICOTINE 14 MG/24HR TD PT24: 14.0000 mg | MEDICATED_PATCH | Freq: Every day | TRANSDERMAL | 0 refills | Status: DC

## 2020-04-30 MED ORDER — AMLODIPINE BESYLATE 5 MG PO TABS
5.0000 mg | ORAL_TABLET | Freq: Every day | ORAL | 3 refills | Status: DC
Start: 2020-04-30 — End: 2020-10-24

## 2020-04-30 MED ORDER — ADULT MULTIVITAMIN W/MINERALS CH
1.0000 | ORAL_TABLET | Freq: Every day | ORAL | 2 refills | Status: DC
Start: 2020-05-01 — End: 2020-10-24

## 2020-04-30 MED ORDER — POTASSIUM CHLORIDE CRYS ER 20 MEQ PO TBCR
40.0000 meq | EXTENDED_RELEASE_TABLET | ORAL | Status: DC
  Administered 2020-04-30: 40 meq via ORAL
  Filled 2020-04-30: qty 2

## 2020-04-30 MED ORDER — LOSARTAN POTASSIUM 50 MG PO TABS
50.0000 mg | ORAL_TABLET | Freq: Every day | ORAL | 3 refills | Status: DC
Start: 2020-04-30 — End: 2021-12-17

## 2020-04-30 MED ORDER — FOLIC ACID 1 MG PO TABS
1.0000 mg | ORAL_TABLET | Freq: Every day | ORAL | 2 refills | Status: DC
Start: 2020-05-01 — End: 2020-10-24

## 2020-04-30 MED ORDER — TRAZODONE HCL 100 MG PO TABS
100.0000 mg | ORAL_TABLET | Freq: Every day | ORAL | 3 refills | Status: DC
Start: 2020-04-30 — End: 2020-10-24

## 2020-04-30 MED ORDER — ASPIRIN EC 81 MG PO TBEC: 81.0000 mg | DELAYED_RELEASE_TABLET | Freq: Every day | ORAL | 0 refills | Status: AC

## 2020-04-30 NOTE — Discharge Instructions (Signed)
1)You had a Stroke--- we need to change your medications, you need to quit smoking AND quit drinking to prevent another stroke 2)Please take Aspirin 81 mg daily along with Plavix 75 mg daily for 30 days then after that STOP the aspirin and continue ONLY Plavix 75 mg daily indefinitely--for stroke prevention 3)Please follow-up with Neurologist Dr. Phillips Odor-- Phone: [phone], Address: [address], [place], [place] [address] in 4 to 6 weeks for recheck and reevaluation.  Please call to make appointment with him 4)Complete abstinence from tobacco and alcohol strongly advised 5)Please take Aspirin, Plavix and Lipitor/atorvastatin as prescribed for stroke prevention 6)Avoid ibuprofen/Advil/Aleve/Motrin/Goody Powders/Naproxen/BC powders/Meloxicam/Diclofenac/Indomethacin and other Nonsteroidal anti-inflammatory medications as these will make you more likely to bleed and can cause stomach ulcers, can also cause Kidney problems.

## 2020-04-30 NOTE — Evaluation (Signed)
Occupational Therapy Evaluation Patient Details Name: Gabriel Koch MRN: [id number] DOB: 1965-11-23 Today's Date: 04/30/2020    History of Present Illness Gabriel Koch is a 55 y.o. male with medical history significant for hypertension who presents to the emergency department due to 2-day onset of left-sided facial and left upper extremity numbness.  Numbness was consistent since onset, he also referred to left knee weakness (but this appears to be chronic).  Patient was able to maintain communication, walk and swallow normally.  He complains of occasional sensation of weakness of left eyelid, but denies facial drooling.   Clinical Impression   Pt reporting continued LUE and left facial numbness. Pt with intact light touch and discrimination. Pt demonstrates LUE strength WNL, coordination is intact. Educated pt on sensation changes after CVA and on need for brain recovery. Pt verbalized understanding. No further OT services required at this time.     Follow Up Recommendations  No OT follow up    Equipment Recommendations  None recommended by OT       Precautions / Restrictions Precautions Precautions: None Restrictions Weight Bearing Restrictions: No      Mobility Bed Mobility Overal bed mobility: Independent                  Transfers Overall transfer level: Independent                        ADL either performed or assessed with clinical judgement   ADL Overall ADL's : Modified independent                                       General ADL Comments: performing ADLs independently     Vision Baseline Vision/History: No visual deficits Patient Visual Report: No change from baseline Vision Assessment?: No apparent visual deficits            Pertinent Vitals/Pain Pain Assessment: No/denies pain     Hand Dominance Right   Extremity/Trunk Assessment Upper Extremity Assessment Upper Extremity Assessment: Overall WFL for  tasks assessed (LUE subjective numbness)   Lower Extremity Assessment Lower Extremity Assessment: Defer to PT evaluation   Cervical / Trunk Assessment Cervical / Trunk Assessment: Normal   Communication Communication Communication: No difficulties   Cognition Arousal/Alertness: Awake/alert Behavior During Therapy: WFL for tasks assessed/performed Overall Cognitive Status: Within Functional Limits for tasks assessed                                                Home Living Family/patient expects to be discharged to:: Private residence Living Arrangements: Other relatives Available Help at Discharge: Family;Available PRN/intermittently Type of Home: House Home Access: Ramped entrance     Home Layout: One level     Bathroom Shower/Tub: Tub/shower unit         Home Equipment: Environmental consultant - 2 wheels;Cane - single point;Bedside commode;Shower seat          Prior Functioning/Environment Level of Independence: Independent        Comments: Hydrographic surveyor, does not drive, independent in ADLs        OT Problem List: Impaired sensation       End of Session    Activity Tolerance: Patient tolerated treatment well Patient left: in  bed;with call bell/phone within reach  OT Visit Diagnosis: Muscle weakness (generalized) (M62.81)                Time: 0230-1720 OT Time Calculation (min): 14 min Charges:  OT General Charges $OT Visit: 1 Visit OT Evaluation $OT Eval Low Complexity: [place], OTR/L  [phone] 04/30/2020, 8:36 AM

## 2020-04-30 NOTE — Discharge Summary (Signed)
Gabriel Koch, is a 54 y.o. male  DOB 11-Jun-1966  MRN [id number].  Admission date:  04/28/2020  Admitting Physician  Roxan Hockey, MD  Discharge Date:  04/30/2020   Primary MD  Redmond School, MD  Recommendations for primary care physician for things to follow:   1)You had a stroke--- we need to change your medications, you need to quit smoking AND quit drinking to prevent another stroke 2)Please take Aspirin 81 mg daily along with Plavix 75 mg daily for 30 days then after that STOP the aspirin and continue ONLY Plavix 75 mg daily indefinitely--for stroke prevention 3)Please follow-up with Neurologist Dr. Phillips Odor-- Phone: [phone], Address: [address], [place], [place] [address] in 4 to 6 weeks for recheck and reevaluation.  Please call to make appointment with him 4)Complete abstinence from tobacco and alcohol strongly advised 5)Please take Aspirin, Plavix and Lipitor/atorvastatin as prescribed for stroke prevention 6)Avoid ibuprofen/Advil/Aleve/Motrin/Goody Powders/Naproxen/BC powders/Meloxicam/Diclofenac/Indomethacin and other Nonsteroidal anti-inflammatory medications as these will make you more likely to bleed and can cause stomach ulcers, can also cause Kidney problems.    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 Active Problems:   GERD (gastroesophageal reflux disease)   HTN (hypertension)   Facial weakness   Left arm numbness   Prolonged QT interval    Hypokalemia   Hypomagnesemia   Tobacco abuse   Alcohol abuse   Noncompliance with diet and medication regimen      Past Medical History:  Diagnosis Date  . Alcohol abuse   . Anxiety and depression   . Blood transfusion   . Complication of anesthesia    patient hemorrrhaged about 2 weeks after tonsillecotmy  . GERD (gastroesophageal reflux disease)   . Hypertension     Past Surgical History:  Procedure Laterality Date  . APPENDECTOMY    . BIOPSY  04/02/2020   Procedure: BIOPSY;  Surgeon: Eloise Harman, DO;  Location: [facility];  Service: Endoscopy;;  . COLONOSCOPY WITH PROPOFOL N/A 04/02/2020   Procedure: COLONOSCOPY WITH PROPOFOL;  Surgeon: Eloise Harman, DO;  Location: [facility];  Service: Endoscopy;  Laterality: N/A;  8:30am  . ESOPHAGOGASTRODUODENOSCOPY (EGD) WITH PROPOFOL N/A 04/02/2020   Procedure: ESOPHAGOGASTRODUODENOSCOPY (EGD) WITH PROPOFOL;  Surgeon: Eloise Harman, DO;  [facility]: [facility];  Service: Endoscopy;  Laterality: N/A;  . POLYPECTOMY  04/02/2020   Procedure: POLYPECTOMY;  Surgeon: Eloise Harman, DO;  [facility]: [facility];  Service: Endoscopy;;  . TONSILLECTOMY       HPI  from the history and physical done on the day of admission:   Chief Complaint: Left facial and left hand numbness  HPI: Gabriel Koch is  a 54 y.o. male with medical history significant for hypertension who presents to the emergency department due to 2-day onset of left-sided facial and left upper extremity numbness.  Numbness was consistent since onset, he also referred to left knee weakness (but this appears to be chronic).  Patient was able to maintain communication, walk and swallow normally.  He complains of occasional sensation of weakness of left eyelid, but denies facial drooling.  ED Course:  In the emergency department, he was hemodynamically stable.  Work-up in the ED showed hypokalemia, hypomagnesemia, troponin x2 was negative.  CT head without  contrast showed no acute intracranial findings.  Chest x-ray showed no active cardiopulmonary disease.  Teleneurology was consulted and recommended further stroke work-up with MRI brain in the morning to rule out stroke and MRI/CT of C-spine to evaluate for cervical stenosis if MRI of brain is negative.  Magnesium and potassium were replenished.  Hospitalist was asked to admit patient for further evaluation and management.      Hospital Course:      Brief Summary:- 54 y.o.malewith medical history significant forhypertension, alcohol and tobacco abuse admitted on 04/29/2020 after 2 days  of left-sided facial and left upper extremity numbness with MRI brain confirming acute right thalamic stroke  A/p 1)Acute Right Thalamic Stroke--- MRI brain findings confirms acute stroke, -MRA head and CTA neck without LVO LDL 49, HDL 24, triglycerides 366 with a total cholesterol of 146 Even though  his LDL is within desired limits, patient should still take Lipitor/Statin for it's Pleiotropic effects (beyond cholesterol lowering benefits) given acute stroke -A1c pending -Echo with preserved EF of 60 to 65% without wall motion abnormalities, patient does have grade 1 diastolic dysfunction, no significant valvular abnormalities -PT eval appreciated -Treat empirically with aspirin, Plavix for 1 month and then Plavix monotherapy after that as outlined in discharge instructions -Neurology consult from Dr. Merlene Laughter appreciated -Lipitor as ordered  2)Hypokalemia/hypomagnesemia in an alcoholic male--- replaced   3)Alcohol abuse--no frank DTs, patient was on CIWA protocol, -Patient not interested in quitting drinking at this time -Multivitamin advised  4)Tobacco abuse--nicotine patch as advised, patient not interested in quitting smoking at this time  5)Prolonged QT interval--in the setting of hypomagnesemia and hypokalemia replaced  Dispo--home  Discharge Condition: stable  Follow UP--Dr.  Merlene Laughter neurologist as advised   Consults obtained -neurology, Dr. Merlene Laughter Diet and Activity recommendation:  As advised  Discharge Instructions    Discharge Instructions    Call MD for:  difficulty breathing, headache or visual disturbances   Complete by: As directed    Call MD for:  persistant dizziness or light-headedness   Complete by: As directed    Call MD for:  persistant nausea and vomiting   Complete by: As directed    Call MD for:  temperature >100.4   Complete by: As directed    Diet - low sodium heart healthy   Complete by: As directed    Discharge instructions   Complete by: As directed    1)You had a stroke--- we need to change your medications, you need to quit smoking AND quit drinking to prevent another stroke 2)Please take Aspirin 81 mg daily along with Plavix 75 mg daily for 30 days then after that STOP the aspirin and continue ONLY Plavix 75 mg daily indefinitely--for stroke prevention 3)Please follow-up with Neurologist Dr. Phillips Odor-- Phone: [phone], Address: [address], [place], [place] [address] in 4 to 6 weeks for recheck and reevaluation.  Please call to  make appointment with him 4)Complete abstinence from tobacco and alcohol strongly advised 5)Please take Aspirin, Plavix and Lipitor/atorvastatin as prescribed for stroke prevention 6)Avoid ibuprofen/Advil/Aleve/Motrin/Goody Powders/Naproxen/BC powders/Meloxicam/Diclofenac/Indomethacin and other Nonsteroidal anti-inflammatory medications as these will make you more likely to bleed and can cause stomach ulcers, can also cause Kidney problems.   Increase activity slowly   Complete by: As directed        Discharge Medications     Allergies as of 04/30/2020      Reactions   Bee Venom       Medication List    STOP taking these medications   HYDROcodone-acetaminophen 10-325 MG tablet Commonly known as: [place] EX   tiZANidine 4 MG tablet Commonly known as: ZANAFLEX      TAKE these medications   acetaminophen 325 MG tablet Commonly known as: TYLENOL Take 650 mg by mouth every 6 (six) hours as needed.   amLODipine 5 MG tablet Commonly known as: NORVASC Take 1 tablet (5 mg total) by mouth daily.   aspirin EC 81 MG tablet Take 1 tablet (81 mg total) by mouth daily with breakfast. Please take Aspirin 81 mg daily along with Plavix 75 mg daily for 30 days then after that STOP the aspirin and continue ONLY Plavix 75 mg daily indefinitely--for stroke prevention   atorvastatin 40 MG tablet Commonly known as: Lipitor Take 1 tablet (40 mg total) by mouth daily. For stroke prevention   busPIRone 10 MG tablet Commonly known as: BUSPAR Take 1 tablet (10 mg total) by mouth 2 (two) times daily.   clopidogrel 75 MG tablet Commonly known as: PLAVIX Take 1 tablet (75 mg total) by mouth daily with breakfast. Please take Aspirin 81 mg daily along with Plavix 75 mg daily for 30 days then after that STOP the aspirin and continue ONLY Plavix 75 mg daily indefinitely--for stroke prevention Start taking on: May 01, 2020   esomeprazole 40 MG capsule Commonly known as: NexIUM Take 1 capsule (40 mg total) by mouth in the morning and at bedtime.   folic acid 1 MG tablet Commonly known as: FOLVITE Take 1 tablet (1 mg total) by mouth daily. Start taking on: May 01, 2020   losartan 50 MG tablet Commonly known as: COZAAR Take 1 tablet (50 mg total) by mouth daily.   multivitamin with minerals Tabs tablet Take 1 tablet by mouth daily. Start taking on: May 01, 2020   MYLANTA PO Take by mouth as needed.   nicotine 14 mg/24hr patch Commonly known as: NICODERM CQ - dosed in mg/24 hours Place 1 patch (14 mg total) onto the skin daily. Start taking on: May 01, 2020   potassium chloride SA 20 MEQ tablet Commonly known as: KLOR-CON Take 20 mEq by mouth in the morning, at noon, in the evening, and at bedtime. Taking daily.   thiamine 100 MG tablet Take 1  tablet (100 mg total) by mouth daily. Start taking on: May 01, 2020   traZODone 100 MG tablet Commonly known as: DESYREL Take 1 tablet (100 mg total) by mouth at bedtime. What changed: when to take this   trolamine salicylate 10 % cream Commonly known as: ASPERCREME Apply 1 application topically as needed for muscle pain.       Major procedures and Radiology Reports - PLEASE review detailed and final reports for all details, in brief -    DG Chest 2 View  Result Date: 04/28/2020 CLINICAL DATA:  Chest pain. EXAM: CHEST - 2 VIEW  COMPARISON:  Dec 02, 2018 FINDINGS: The heart size and mediastinal contours are within normal limits. Both lungs are clear. Chronic eighth and ninth right rib fractures are seen. IMPRESSION: No active cardiopulmonary disease. Electronically Signed   By: Virgina Norfolk M.D.   On: 04/28/2020 18:07   CT Head Wo Contrast  Result Date: 04/28/2020 CLINICAL DATA:  Neurologic deficit. EXAM: CT HEAD WITHOUT CONTRAST TECHNIQUE: Contiguous axial images were obtained from the base of the skull through the vertex without intravenous contrast. COMPARISON:  Brain MRI 02/09/2009 FINDINGS: Brain: No acute intracranial hemorrhage. No focal mass lesion. No CT evidence of acute infarction. No midline shift or mass effect. No hydrocephalus. Basilar cisterns are patent. Mild cortical atrophy. Vascular: No hyperdense vessel or unexpected calcification. Skull: Normal. Negative for fracture or focal lesion. Sinuses/Orbits: Paranasal sinuses and mastoid air cells are clear. Orbits are clear. Other: None. IMPRESSION: No acute intracranial findings. Electronically Signed   By: Suzy Bouchard M.D.   On: 04/28/2020 19:46   CT ANGIO NECK W OR WO CONTRAST  Result Date: 04/29/2020 CLINICAL DATA:  Neuro deficit, stroke suspected EXAM: CT ANGIOGRAPHY NECK TECHNIQUE: Multidetector CT imaging of the neck was performed using the standard protocol during bolus administration of intravenous  contrast. Multiplanar CT image reconstructions and MIPs were obtained to evaluate the vascular anatomy. Carotid stenosis measurements (when applicable) are obtained utilizing NASCET criteria, using the distal internal carotid diameter as the denominator. CONTRAST:  67mL OMNIPAQUE IOHEXOL 350 MG/ML SOLN COMPARISON:  None. FINDINGS: Aortic arch: Standard branching. Imaged portion shows no evidence of aneurysm or dissection. Mild atherosclerotic changes aortic arch. No significant stenosis of the major arch vessel origins. Right carotid system: Mixed density atherosclerotic plaque in the distal right carotid bulb/proximal cervical right ICA resulting in approximately 50% stenosis. Left carotid system: Atherosclerotic changes along the left common carotid artery with mixed density plaques, without hemodynamically significant stenosis. Mild atherosclerotic changes at the left carotid bifurcation without hemodynamically significant stenosis. Vertebral arteries: Small calcified plaque at the origin of the each vertebral artery without stenosis. The vertebral arteries have normal course and caliber throughout the neck and head. The left vertebral artery is dominant. Skeleton: Degenerative changes of the cervical spine, more pronounced C5-6 where there is moderate left neural foraminal narrowing. No aggressive bone lesion. Periapical lucency at left mandibular second premolar tooth. Other neck: Negative. Upper chest: No acute findings. IMPRESSION: 1. No occlusion of major neck arteries or evidence of dissection. 2. Mixed density atherosclerotic plaque in the distal right carotid bulb/proximal cervical right ICA resulting in approximately 50% stenosis. 3. Atherosclerotic plaques at the left carotid bifurcation and left common carotid artery without hemodynamically significant stenosis. 4. The vertebral arteries are patent throughout the neck and head. Aortic Atherosclerosis (ICD10-I70.0). Electronically Signed   By: Pedro Earls M.D.   On: 04/29/2020 10:01   MR ANGIO HEAD WO CONTRAST  Result Date: 04/29/2020 CLINICAL DATA:  Neuro deficit, acute, stroke suspected. Additional history provided: Dizziness and weakness x 1 day EXAM: MRI HEAD WITHOUT CONTRAST MRA HEAD WITHOUT CONTRAST TECHNIQUE: Multiplanar, multiecho pulse sequences of the brain and surrounding structures were obtained without intravenous contrast. Angiographic images of the head were obtained using MRA technique without contrast. COMPARISON:  Noncontrast head CT 04/28/2020.  Brain MRI 02/09/2009. FINDINGS: MRI HEAD FINDINGS The patient was unable to tolerate the full examination. As a result, the routine axial T2/FLAIR sequence, axial T1 sequence, axial T2* sequence and coronal T2 sequence could not be obtained. Brain: Mild  generalized cerebral atrophy. 8 mm focus of restricted diffusion within the right thalamus consistent with acute infarction (series 5, image 17). Corresponding T2 hyperintensity at this site. Mild multifocal T2 hyperintensity within the cerebral white matter is nonspecific, but consistent with chronic small vessel ischemic disease. No evidence of intracranial mass. No extra-axial fluid collection. No midline shift. Vascular: Reported below. Skull and upper cervical spine: No focal marrow lesion. Sinuses/Orbits: Visualized orbits show no acute finding. No significant paranasal sinus disease at the imaged levels. Trace fluid within bilateral mastoid air cells. MRA HEAD FINDINGS Mildly motion degraded examination. The intracranial internal carotid arteries are patent. The M1 middle cerebral arteries are patent without significant stenosis. No M2 proximal branch occlusion or high-grade proximal stenosis is identified. The anterior cerebral arteries are patent. The visualized intracranial vertebral arteries are patent. The basilar artery is patent. The posterior cerebral arteries are patent. Apparent moderate to moderately severe stenosis  within the left PCA at the P2/P3 junction. Posterior communicating arteries are hypoplastic or absent bilaterally. 1-2 mm inferiorly projecting vascular protrusions arising from the supraclinoid internal carotid arteries bilaterally which may reflect infundibula or small aneurysms. IMPRESSION: MRI brain: 1. Prematurely terminated examination as described. 2. 8 mm acute infarct within the right thalamus. 3. Mild generalized cerebral atrophy and chronic small vessel ischemic disease. MRA head: 1. Mildly motion degraded examination. 2. No intracranial large vessel occlusion. 3. Apparent moderate to moderately severe focal stenosis within the left PCA at the P2/P3 junction. 4. 1-2 mm inferiorly projecting vascular protrusions arising from the supraclinoid internal carotid arteries bilaterally, which may reflect infundibula or small aneurysms. Electronically Signed   By: Kellie Simmering DO   On: 04/29/2020 08:30   MR BRAIN WO CONTRAST  Result Date: 04/29/2020 CLINICAL DATA:  Neuro deficit, acute, stroke suspected. Additional history provided: Dizziness and weakness x 1 day EXAM: MRI HEAD WITHOUT CONTRAST MRA HEAD WITHOUT CONTRAST TECHNIQUE: Multiplanar, multiecho pulse sequences of the brain and surrounding structures were obtained without intravenous contrast. Angiographic images of the head were obtained using MRA technique without contrast. COMPARISON:  Noncontrast head CT 04/28/2020.  Brain MRI 02/09/2009. FINDINGS: MRI HEAD FINDINGS The patient was unable to tolerate the full examination. As a result, the routine axial T2/FLAIR sequence, axial T1 sequence, axial T2* sequence and coronal T2 sequence could not be obtained. Brain: Mild generalized cerebral atrophy. 8 mm focus of restricted diffusion within the right thalamus consistent with acute infarction (series 5, image 17). Corresponding T2 hyperintensity at this site. Mild multifocal T2 hyperintensity within the cerebral white matter is nonspecific, but  consistent with chronic small vessel ischemic disease. No evidence of intracranial mass. No extra-axial fluid collection. No midline shift. Vascular: Reported below. Skull and upper cervical spine: No focal marrow lesion. Sinuses/Orbits: Visualized orbits show no acute finding. No significant paranasal sinus disease at the imaged levels. Trace fluid within bilateral mastoid air cells. MRA HEAD FINDINGS Mildly motion degraded examination. The intracranial internal carotid arteries are patent. The M1 middle cerebral arteries are patent without significant stenosis. No M2 proximal branch occlusion or high-grade proximal stenosis is identified. The anterior cerebral arteries are patent. The visualized intracranial vertebral arteries are patent. The basilar artery is patent. The posterior cerebral arteries are patent. Apparent moderate to moderately severe stenosis within the left PCA at the P2/P3 junction. Posterior communicating arteries are hypoplastic or absent bilaterally. 1-2 mm inferiorly projecting vascular protrusions arising from the supraclinoid internal carotid arteries bilaterally which may reflect infundibula or small aneurysms. IMPRESSION: MRI brain:  1. Prematurely terminated examination as described. 2. 8 mm acute infarct within the right thalamus. 3. Mild generalized cerebral atrophy and chronic small vessel ischemic disease. MRA head: 1. Mildly motion degraded examination. 2. No intracranial large vessel occlusion. 3. Apparent moderate to moderately severe focal stenosis within the left PCA at the P2/P3 junction. 4. 1-2 mm inferiorly projecting vascular protrusions arising from the supraclinoid internal carotid arteries bilaterally, which may reflect infundibula or small aneurysms. Electronically Signed   By: Kellie Simmering DO   On: 04/29/2020 08:30   ECHOCARDIOGRAM COMPLETE  Result Date: 04/29/2020    ECHOCARDIOGRAM REPORT   Patient Name:   ZAKAREE MCCLENAHAN Date of Exam: 04/29/2020 Medical Rec #:   [id number]         Height:       69.0 in Accession #:    [id number]        Weight:       158.0 lb Date of Birth:  06-09-1966         BSA:          1.869 m Patient Age:    26 years          BP:           181/113 mmHg Patient Gender: M                 HR:           67 bpm. Exam Location:  [name] Procedure: 2D Echo, Cardiac Doppler and Color Doppler Indications:    Stroke 434.91 / I163.9  History:        Patient has no prior history of Echocardiogram examinations.                 Risk Factors:Hypertension. Prolonged QT interval, Alcohol abuse                 (From Hx).  Sonographer:    Alvino Chapel RCS Referring Phys: [id number] Jalena Vanderlinden IMPRESSIONS  1. Left ventricular ejection fraction, by estimation, is 60 to 65%. The left ventricle has normal function. The left ventricle has no regional wall motion abnormalities. Left ventricular diastolic parameters are consistent with Grade I diastolic dysfunction (impaired relaxation).  2. Right ventricular systolic function is normal. The right ventricular size is normal.  3. The mitral valve is normal in structure. No evidence of mitral valve regurgitation. No evidence of mitral stenosis.  4. The aortic valve is tricuspid. Aortic valve regurgitation is not visualized. No aortic stenosis is present.  5. The inferior vena cava is normal in size with greater than 50% respiratory variability, suggesting right atrial pressure of 3 mmHg. FINDINGS  Left Ventricle: Left ventricular ejection fraction, by estimation, is 60 to 65%. The left ventricle has normal function. The left ventricle has no regional wall motion abnormalities. The left ventricular internal cavity size was normal in size. There is  no left ventricular hypertrophy. Left ventricular diastolic parameters are consistent with Grade I diastolic dysfunction (impaired relaxation). Normal left ventricular filling pressure. Right Ventricle: The right ventricular size is normal. No increase in right ventricular wall  thickness. Right ventricular systolic function is normal. Left Atrium: Left atrial size was normal in size. Right Atrium: Right atrial size was normal in size. Pericardium: There is no evidence of pericardial effusion. Mitral Valve: The mitral valve is normal in structure. No evidence of mitral valve regurgitation. No evidence of mitral valve stenosis. Tricuspid Valve: The tricuspid valve is normal in structure. Tricuspid valve  regurgitation is not demonstrated. No evidence of tricuspid stenosis. Aortic Valve: The aortic valve is tricuspid. Aortic valve regurgitation is not visualized. No aortic stenosis is present. Aortic valve mean gradient measures 4.8 mmHg. Aortic valve peak gradient measures 9.9 mmHg. Aortic valve area, by VTI measures 2.48 cm. Pulmonic Valve: The pulmonic valve was not well visualized. Pulmonic valve regurgitation is not visualized. No evidence of pulmonic stenosis. Aorta: The aortic root is normal in size and structure. Pulmonary Artery: Indeterminant PASP, inadequate TR Jet. Venous: The inferior vena cava is normal in size with greater than 50% respiratory variability, suggesting right atrial pressure of 3 mmHg. IAS/Shunts: No atrial level shunt detected by color flow Doppler.  LEFT VENTRICLE PLAX 2D LVIDd:         4.52 cm  Diastology LVIDs:         2.77 cm  LV e' medial:    8.05 cm/s LV PW:         0.94 cm  LV E/e' medial:  11.6 LV IVS:        1.06 cm  LV e' lateral:   9.46 cm/s LVOT diam:     1.90 cm  LV E/e' lateral: 9.9 LV SV:         73 LV SV Index:   39 LVOT Area:     2.84 cm  RIGHT VENTRICLE RV S prime:     11.60 cm/s TAPSE (M-mode): 1.8 cm LEFT ATRIUM             Index       RIGHT ATRIUM           Index LA diam:        3.40 cm 1.82 cm/m  RA Area:     15.20 cm LA Vol (A2C):   46.4 ml 24.83 ml/m RA Volume:   40.20 ml  21.51 ml/m LA Vol (A4C):   47.1 ml 25.20 ml/m LA Biplane Vol: 47.4 ml 25.36 ml/m  AORTIC VALVE AV Area (Vmax):    2.52 cm AV Area (Vmean):   2.40 cm AV Area  (VTI):     2.48 cm AV Vmax:           157.46 cm/s AV Vmean:          104.541 cm/s AV VTI:            0.294 m AV Peak Grad:      9.9 mmHg AV Mean Grad:      4.8 mmHg LVOT Vmax:         140.00 cm/s LVOT Vmean:        88.500 cm/s LVOT VTI:          0.258 m LVOT/AV VTI ratio: 0.88  AORTA Ao Root diam: 3.10 cm MITRAL VALVE MV Area (PHT): 4.33 cm     SHUNTS MV Decel Time: 175 msec     Systemic VTI:  0.26 m MV E velocity: 93.40 cm/s   Systemic Diam: 1.90 cm MV A velocity: 110.00 cm/s MV E/A ratio:  0.85 Carlyle Dolly MD Electronically signed by Carlyle Dolly MD Signature Date/Time: 04/29/2020/4:26:50 PM    Final     Micro Results    Recent Results (from the past 240 hour(s))  Respiratory Panel by RT PCR (Flu A&B, Covid) - Nasopharyngeal Swab     Status: None   Collection Time: 04/28/20  9:28 PM   Specimen: Nasopharyngeal Swab  Result Value Ref Range Status   SARS Coronavirus 2 by RT PCR NEGATIVE  NEGATIVE Final    Comment: (NOTE) SARS-CoV-2 target nucleic acids are NOT DETECTED.  The SARS-CoV-2 RNA is generally detectable in upper respiratoy specimens during the acute phase of infection. The lowest concentration of SARS-CoV-2 viral copies this assay can detect is 131 copies/mL. A negative result does not preclude SARS-Cov-2 infection and should not be used as the sole basis for treatment or other patient management decisions. A negative result may occur with  improper specimen collection/handling, submission of specimen other than nasopharyngeal swab, presence of viral mutation(s) within the areas targeted by this assay, and inadequate number of viral copies (<131 copies/mL). A negative result must be combined with clinical observations, patient history, and epidemiological information. The expected result is Negative.  Fact Sheet for Patients:  PinkCheek.be  Fact Sheet for Healthcare Providers:  GravelBags.it  This test is no t  yet approved or cleared by the [place] FDA and  has been authorized for detection and/or diagnosis of SARS-CoV-2 by FDA under an Emergency Use Authorization (EUA). This EUA will remain  in effect (meaning this test can be used) for the duration of the COVID-19 declaration under Section 564(b)(1) of the Act, 21 U.S.C. section 360bbb-3(b)(1), unless the authorization is terminated or revoked sooner.     Influenza A by PCR NEGATIVE NEGATIVE Final   Influenza B by PCR NEGATIVE NEGATIVE Final    Comment: (NOTE) The Xpert Xpress SARS-CoV-2/FLU/RSV assay is intended as an aid in  the diagnosis of influenza from Nasopharyngeal swab specimens and  should not be used as a sole basis for treatment. Nasal washings and  aspirates are unacceptable for Xpert Xpress SARS-CoV-2/FLU/RSV  testing.  Fact Sheet for Patients: PinkCheek.be  Fact Sheet for Healthcare Providers: GravelBags.it  This test is not yet approved or cleared by the [place] FDA and  has been authorized for detection and/or diagnosis of SARS-CoV-2 by  FDA under an Emergency Use Authorization (EUA). This EUA will remain  in effect (meaning this test can be used) for the duration of the  Covid-19 declaration under Section 564(b)(1) of the Act, 21  U.S.C. section 360bbb-3(b)(1), unless the authorization is  terminated or revoked. Performed at [facility], [address], [place], [place] [address]     Today   Subjective    Honest Vanleer today has no new complaints        No fever  Or chills   No Nausea, Vomiting or Diarrhea  --No speech or swallowing difficulties -No chest pains no palpitations no dizziness no shortness of breath -Left-sided facial and upper extremity numbness is slightly better   Patient has been seen and examined prior to discharge   Objective   Blood pressure (!) 154/94, pulse 73, temperature 98.3 F (36.8 C), temperature  source Oral, resp. rate 15, height 5\' 9"  (1.753 m), weight 71.7 kg, SpO2 98 %.  No intake or output data in the 24 hours ending 04/30/20 1037  Exam Gen:- Awake Alert, no acute distress  HEENT:- [place].AT, No sclera icterus Neck-Supple Neck,No JVD,.  Lungs-  CTAB , good air movement bilaterally  CV- S1, S2 normal, regular Abd-  +ve B.Sounds, Abd Soft, No tenderness,    Extremity/Skin:- No  edema,   good pulses Psych-affect is appropriate, oriented x3 Neuro-left-sided facial and upper extremity numbness appears to be improving  -no additional new focal deficits, no tremors    Data Review   CBC w Diff:  Lab Results  Component Value Date   WBC 2.8 (L) 04/29/2020  HGB 11.8 (L) 04/29/2020   HGB 14.5 03/09/2013   HCT 32.3 (L) 04/29/2020   HCT 39.6 (L) 03/09/2013   PLT 130 (L) 04/29/2020   PLT 264 03/09/2013   LYMPHOPCT 24 03/29/2020   LYMPHOPCT 32.0 08/24/2012   MONOPCT 13 03/29/2020   MONOPCT 12.0 08/24/2012   EOSPCT 1 03/29/2020   EOSPCT 14.4 08/24/2012   BASOPCT 1 03/29/2020   BASOPCT 1.0 08/24/2012    CMP:  Lab Results  Component Value Date   NA 136 04/30/2020   NA 132 (L) 03/09/2013   K 3.2 (L) 04/30/2020   K 2.7 (L) 03/09/2013   CL 101 04/30/2020   CL 97 (L) 03/09/2013   CO2 27 04/30/2020   CO2 26 03/09/2013   BUN 8 04/30/2020   BUN 8 03/09/2013   CREATININE 0.91 04/30/2020   CREATININE 0.86 03/09/2013   PROT 6.3 (L) 04/29/2020   PROT 7.2 03/09/2013   ALBUMIN 3.1 (L) 04/30/2020   ALBUMIN 4.0 03/09/2013   BILITOT 1.6 (H) 04/29/2020   BILITOT 0.4 03/09/2013   ALKPHOS 61 04/29/2020   ALKPHOS 87 03/09/2013   AST 40 04/29/2020   AST 33 03/09/2013   ALT 25 04/29/2020   ALT 31 03/09/2013  .   Total Discharge time is about 33 minutes  Roxan Hockey M.D on 04/30/2020 at 10:37 AM  Go to [email or web address] -  for contact info  [facility] - Office  [phone]

## 2020-04-30 NOTE — TOC Initial Note (Signed)
Transition of Care [facility]) - Initial/Assessment Note    Patient Details  Name: Gabriel Koch MRN: [id number] Date of Birth: [birth year]  Transition of Care [facility]) CM/SW Contact:    Salome Arnt, LCSW Phone Number: 04/30/2020, 11:43 AM  Clinical Narrative:  TOC referred due to substance use. Pt reports he lives with his aunt and is independent with ADLs. He has to "hunt" for transportation when needed. 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 He admits to drinking 2-3 alcoholic beverages a day and said he smokes a pack of cigarettes a day. MD came in during assessment and also addressed substance use. Pt did not appear concerned about his substance use. He states he doesn't feel ready to leave and MD informed pt he was medically stable. Pt also said he can't afford transportation or medications. LCSW arranged transport with [employer] for 1:00 at main entrance. Pt aware. LCSW called [facility] as pt indicates this is his preferred pharmacy. LCSW provided new medications to pharmacist and notified pt of costs. He is aware that [facility] voucher does not cover Nicoderm patch which is $96. Pt also understands pt has to pay $3 per medication using MATCH voucher and pt only gets one per year. After being notified of prescription costs (each under $17) pt states he will talk to his aunt about helping with medications. RN updated on transportation.                 Expected Discharge Plan: Home/Self Care Barriers to Discharge: Inadequate or no insurance   Patient Goals and CMS Choice Patient states their goals for this hospitalization and ongoing recovery are:: return home   Choice offered to / list presented to : Patient  Expected Discharge Plan and Services Expected Discharge Plan: Home/Self Care In-house Referral: Clinical Social Work   Post Acute Care Choice: NA Living arrangements for the past 2 months: Single Family Home Expected Discharge  Date: 04/30/20                         [facility] Arranged: NA          Prior Living Arrangements/Services Living arrangements for the past 2 months: Single Family Home Lives with:: Relatives Patient language and need for interpreter reviewed:: Yes Do you feel safe going back to the place where you live?: Yes      Need for Family Participation in Patient Care: No (Comment) Care giver support system in place?: No (comment)   Criminal Activity/Legal Involvement Pertinent to Current Situation/Hospitalization: No - Comment as needed  Activities of Daily Living Home Assistive Devices/Equipment: None ADL Screening (condition at time of admission) Patient's cognitive ability adequate to safely complete daily activities?: Yes Is the patient deaf or have difficulty hearing?: No Does the patient have difficulty seeing, even when wearing glasses/contacts?: No Does the patient have difficulty concentrating, remembering, or making decisions?: No Patient able to express need for assistance with ADLs?: Yes Does the patient have difficulty dressing or bathing?: No Independently performs ADLs?: Yes (appropriate for developmental age) Does the patient have difficulty walking or climbing stairs?: No Weakness of Legs: Left Weakness of Arms/Hands: Left  Permission Sought/Granted                  Emotional Assessment Appearance:: Appears stated age   Affect (typically observed): Agitated Orientation: : Oriented to Self, Oriented to Place, Oriented to  Time, Oriented to Situation Alcohol / Substance  Use: Alcohol Use, Tobacco Use Psych Involvement: No (comment)  Admission diagnosis:  Facial weakness [R29.810] Hypokalemia [E87.6] Hypomagnesemia [E83.42] Arm numbness left [R20.0] Left facial numbness [R20.0] Stroke (cerebrum) [facility]) [I63.9] Patient Active Problem List   Diagnosis Date Noted  . Tobacco abuse 04/30/2020  . Alcohol abuse 04/30/2020  . Noncompliance with diet and medication  regimen 04/30/2020  . Left arm numbness 04/29/2020  . Prolonged QT interval 04/29/2020  . Hypokalemia 04/29/2020  . Hypomagnesemia 04/29/2020  . 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 04/29/2020  . Facial weakness 04/28/2020  . HTN (hypertension) 01/20/2020  . GERD (gastroesophageal reflux disease) 01/19/2020  . Vomiting 01/19/2020  . Rectal bleeding 01/19/2020  . Alternating constipation and diarrhea 01/19/2020   PCP:  Redmond School, MD Pharmacy:   [facility], [place] [address] [place] [address] Phone: [phone] Fax: [phone]     Social Determinants of Health (SDOH) Interventions    Readmission Risk Interventions No flowsheet data found.

## 2020-05-01 ENCOUNTER — Telehealth: Payer: Self-pay | Admitting: Internal Medicine

## 2020-05-01 LAB — HOMOCYSTEINE: Homocysteine: 48.1 umol/L — ABNORMAL HIGH (ref 0.0–14.5)

## 2020-05-01 LAB — RPR: RPR Ser Ql: NONREACTIVE

## 2020-05-01 NOTE — Telephone Encounter (Signed)
Dr. Abbey Chatters please advise

## 2020-05-01 NOTE — Telephone Encounter (Signed)
[phone] or [phone]   Patient had a stroke and wants to know if he is still supposed to have the procedure he is scheduled for.  Also needs to know if he still needs his follow up here that was scheduled, he is confused

## 2020-05-01 NOTE — Telephone Encounter (Signed)
Pt wants to speak with nurse. [phone]

## 2020-05-06 LAB — ANTIPHOSPHOLIPID SYNDROME EVAL, BLD
Anticardiolipin IgA: <9 APL U/mL (ref 0–11)
Anticardiolipin IgG: <9 GPL U/mL (ref 0–14)
Anticardiolipin IgM: 14 MPL U/mL — ABNORMAL HIGH (ref 0–12)
DRVVT: 39.9 s (ref 0.0–47.0)
PTT Lupus Anticoagulant: 30.9 s (ref 0.0–51.9)
Phosphatydalserine, IgA: 2 APS IgA (ref 0–20)
Phosphatydalserine, IgG: <10 Units (ref 0–30)
Phosphatydalserine, IgM: <22 MPS IgM (ref <22)

## 2020-05-08 ENCOUNTER — Encounter: Payer: Self-pay | Admitting: Internal Medicine

## 2020-05-08 NOTE — Telephone Encounter (Signed)
Patient scheduled and letter sent  °

## 2020-05-08 NOTE — Telephone Encounter (Signed)
I reviewed patient's hospitalization.  He is going to be on aspirin and Plavix for a month and then Plavix daily thereafter.  Appears he is supposed to have a follow-up visit with Dr. Phillips Odor in 4 to 6 weeks.  We can reevaluate performing EGD after that time.  We will need to discuss case with Dr. Merlene Laughter as I would like to hold his Plavix for 5 days prior to the EGD as the goal is to take out a duodenal polyp.  Thank you

## 2020-05-08 NOTE — Telephone Encounter (Signed)
Please postpone his procedure for now.  Can we obtain records from his hospitalization regarding the new stroke?  Needs to follow-up in clinic prior to rescheduling.  I attempted to call patient but he did not answer the phone. thank you

## 2020-05-08 NOTE — Telephone Encounter (Signed)
Called endo and made aware to cancel. Notes are in Epic from 10/24 admission. Erline Levine please schedule OV and mail letter. I called pt and LMOVM as well. Thanks

## 2020-05-08 NOTE — Telephone Encounter (Signed)
Noted. Will have patient follow up in the office after 4-6 weeks

## 2020-05-20 ENCOUNTER — Telehealth: Payer: Self-pay | Admitting: Internal Medicine

## 2020-05-20 NOTE — Telephone Encounter (Signed)
LMOVM for pt  Per Dr. Ave Filter last note "I reviewed patient's hospitalization.  He is going to be on aspirin and Plavix for a month and then Plavix daily thereafter.  Appears he is supposed to have a follow-up visit with Dr. Phillips Odor in 4 to 6 weeks.  We can reevaluate performing EGD after that time.  We will need to discuss case with Dr. Merlene Laughter as I would like to hold his Plavix for 5 days prior to the EGD as the goal is to take out a duodenal polyp.  Thank you"

## 2020-05-20 NOTE — Telephone Encounter (Signed)
Pt is scheduled OV with [place] 12/13. He is wanting to schedule his EGD, but he said he has had a light stroke recently and will he need clearance from his doctor. Please advise. [phone]

## 2020-05-21 NOTE — Telephone Encounter (Signed)
Patient called back. He has not made an appointment with Dr. Freddie Apley office yet bc he isn't sure if he will have to pay a copay. I advised will need to call the office and see what they can work something out.

## 2020-05-21 NOTE — Telephone Encounter (Signed)
LMOVM for pt on named VM

## 2020-06-03 ENCOUNTER — Ambulatory Visit: Payer: Medicaid Other | Admitting: Gastroenterology

## 2020-06-07 ENCOUNTER — Other Ambulatory Visit (HOSPITAL_COMMUNITY): Payer: Medicaid Other

## 2020-06-11 ENCOUNTER — Ambulatory Visit (HOSPITAL_COMMUNITY): Admit: 2020-06-11 | Payer: Medicaid Other

## 2020-06-11 ENCOUNTER — Encounter (HOSPITAL_COMMUNITY): Payer: Self-pay

## 2020-06-11 SURGERY — ESOPHAGOGASTRODUODENOSCOPY (EGD) WITH PROPOFOL
Anesthesia: Monitor Anesthesia Care

## 2020-06-17 ENCOUNTER — Ambulatory Visit: Payer: Medicaid Other | Admitting: Gastroenterology

## 2020-08-01 ENCOUNTER — Telehealth: Payer: Self-pay | Admitting: Internal Medicine

## 2020-08-01 NOTE — Telephone Encounter (Signed)
Hey Mindy, can we call this patient and try to get him in for an office visit.  He has a duodenal polyp that needs to be removed.  Thank you

## 2020-08-01 NOTE — Telephone Encounter (Signed)
Called pt. Offered several appts but declined and he did not schedule until 3/2 at 9am. He also states he is not going to be able to pay any copays or payments when he comes in for for the procedure. FYI to Dr. Abbey Chatters and Erline Levine

## 2020-08-01 NOTE — Telephone Encounter (Signed)
noted 

## 2020-09-04 ENCOUNTER — Encounter: Payer: Self-pay | Admitting: Internal Medicine

## 2020-09-04 ENCOUNTER — Ambulatory Visit (INDEPENDENT_AMBULATORY_CARE_PROVIDER_SITE_OTHER): Payer: Self-pay | Admitting: Internal Medicine

## 2020-09-04 ENCOUNTER — Other Ambulatory Visit: Payer: Self-pay

## 2020-09-04 VITALS — BP 162/86 | HR 119 | Temp 97.1°F | Ht 69.0 in | Wt 156.2 lb

## 2020-09-04 DIAGNOSIS — D132 Benign neoplasm of duodenum: Secondary | ICD-10-CM

## 2020-09-04 DIAGNOSIS — K219 Gastro-esophageal reflux disease without esophagitis: Secondary | ICD-10-CM

## 2020-09-04 NOTE — Patient Instructions (Signed)
We will schedule you for EGD to remove the polyp in your small bowel.  Continue on esomeprazole for your chronic reflux.  We will reach out to Dr. Lily Lovings for his blessing on the procedure itself as well as holding your clopidogrel for 5 days prior to procedure.  At [facility] we value your feedback. You may receive a survey about your visit today. Please share your experience as we strive to create trusting relationships with our patients to provide genuine, compassionate, quality care.  We appreciate your understanding and patience as we review any laboratory studies, imaging, and other diagnostic tests that are ordered as we care for you. Our office policy is 5 business days for review of these results, and any emergent or urgent results are addressed in a timely manner for your best interest. If you do not hear from our office in 1 week, please contact us.   We also encourage the use of MyChart, which contains your medical information for your review as well. If you are not enrolled in this feature, an access code is on this after visit summary for your convenience. Thank you for allowing [place] to be involved in your care.  It was great to see you today!  I hope you have a great rest of your winter!!    Elon Alas. Abbey Chatters, D.O. Gastroenterology and Hepatology [facility]

## 2020-09-04 NOTE — Progress Notes (Signed)
Referring Provider: Redmond School, MD Primary Care Physician:  Redmond School, MD Primary GI:  Dr. Abbey Chatters  Chief Complaint  Patient presents with  . Gastroesophageal Reflux    Gagging when he gets hot, consult for EGD    HPI:   Gabriel Koch is a 55 y.o. male who presents to clinic today for follow-up visit.  Previously underwent EGD for chronic reflux on 04/02/2020.  Findings consistent with Barrett's esophagus with esophageal biopsy showing intestinal metaplasia without dysplasia.  Stomach biopsies were negative for H. pylori.  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  Patient was scheduled for repeat EGD to undergo polypectomy, but suffered a stroke in October so the procedure was canceled.  He currently takes aspirin and Plavix.  Past Medical History:  Diagnosis Date  . Alcohol abuse   . Anxiety and depression   . Blood transfusion   . Complication of anesthesia    patient hemorrrhaged about 2 weeks after tonsillecotmy  . GERD (gastroesophageal reflux disease)   . Hypertension     Past Surgical History:  Procedure Laterality Date  . APPENDECTOMY    . BIOPSY  04/02/2020   Procedure: BIOPSY;  Surgeon: Eloise Harman, DO;  Location: [facility];  Service: Endoscopy;;  . COLONOSCOPY WITH PROPOFOL N/A 04/02/2020   Procedure: COLONOSCOPY WITH PROPOFOL;  Surgeon: Eloise Harman, DO;  Location: [facility];  Service: Endoscopy;  Laterality: N/A;  8:30am  . ESOPHAGOGASTRODUODENOSCOPY (EGD) WITH PROPOFOL N/A 04/02/2020   Procedure: ESOPHAGOGASTRODUODENOSCOPY (EGD) WITH PROPOFOL;  Surgeon: Eloise Harman, DO;  Location: [facility];  Service: Endoscopy;  Laterality: N/A;  . POLYPECTOMY  04/02/2020   Procedure: POLYPECTOMY;  Surgeon: Eloise Harman, DO;  Location: [facility];  Service: Endoscopy;;  . TONSILLECTOMY      Current Outpatient Medications  Medication Sig Dispense Refill  . acetaminophen  (TYLENOL) 325 MG tablet Take 650 mg by mouth every 6 (six) hours as needed.    Marland Kitchen amLODipine (NORVASC) 5 MG tablet Take 1 tablet (5 mg total) by mouth daily. 30 tablet 3  . busPIRone (BUSPAR) 10 MG tablet Take 1 tablet (10 mg total) by mouth 2 (two) times daily. 60 tablet 3  . clopidogrel (PLAVIX) 75 MG tablet Take 1 tablet (75 mg total) by mouth daily with breakfast. Please take Aspirin 81 mg daily along with Plavix 75 mg daily for 30 days then after that STOP the aspirin and continue ONLY Plavix 75 mg daily indefinitely--for stroke prevention 30 tablet 11  . esomeprazole (NEXIUM) 40 MG capsule Take 1 capsule (40 mg total) by mouth in the morning and at bedtime. 60 capsule 5  . folic acid (FOLVITE) 1 MG tablet Take 1 tablet (1 mg total) by mouth daily. 30 tablet 2  . HYDROcodone-acetaminophen (NORCO) 10-325 MG tablet Take 1 tablet by mouth as needed.    Marland Kitchen losartan (COZAAR) 50 MG tablet Take 1 tablet (50 mg total) by mouth daily. 30 tablet 3  . traZODone (DESYREL) 100 MG tablet Take 1 tablet (100 mg total) by mouth at bedtime. 30 tablet 3  . trolamine salicylate (ASPERCREME) 10 % cream Apply 1 application topically as needed for muscle pain.    Marland Kitchen Alum & Mag Hydroxide-Simeth (MYLANTA PO) Take by mouth as needed. (Patient not taking: Reported on 09/04/2020)    . atorvastatin (LIPITOR) 40 MG tablet Take 1 tablet (40 mg total) by mouth daily. For  stroke prevention (Patient not taking: Reported on 09/04/2020) 30 tablet 11  . Multiple Vitamin (MULTIVITAMIN WITH MINERALS) TABS tablet Take 1 tablet by mouth daily. (Patient not taking: Reported on 09/04/2020) 30 tablet 2  . nicotine (NICODERM CQ - DOSED IN MG/24 HOURS) 14 mg/24hr patch Place 1 patch (14 mg total) onto the skin daily. (Patient not taking: Reported on 09/04/2020) 28 patch 0  . potassium chloride SA (KLOR-CON) 20 MEQ tablet Take 20 mEq by mouth in the morning, at noon, in the evening, and at bedtime. Taking daily. (Patient not taking: Reported on 09/04/2020)     . thiamine 100 MG tablet Take 1 tablet (100 mg total) by mouth daily. (Patient not taking: Reported on 09/04/2020) 30 tablet 3   No current facility-administered medications for this visit.    Allergies as of 09/04/2020 - Review Complete 09/04/2020  Allergen Reaction Noted  . Bee venom  09/09/2011    Family History  Problem Relation Age of Onset  . Colon cancer Neg Hx        Pt knows little about family history    Social History   Socioeconomic History  . Marital status: Legally Separated    Spouse name: Not on file  . Number of children: Not on file  . Years of education: Not on file  . Highest education level: Not on file  Occupational History  . Not on file  Tobacco Use  . Smoking status: Current Every Day Smoker    Packs/day: 1.00    Years: 35.00    Pack years: 35.00    Types: Cigarettes  . Smokeless tobacco: Never Used  Substance and Sexual Activity  . Alcohol use: Yes    Alcohol/week: 6.0 standard drinks    Types: 6 Cans of beer per week    Comment:  previous alcoholic  . Drug use: No  . Sexual activity: Not Currently  Other Topics Concern  . Not on file  Social History Narrative  . Not on file   Social Determinants of Health   Financial Resource Strain: Not on file  Food Insecurity: Not on file  Transportation Needs: Not on file  Physical Activity: Not on file  Stress: Not on file  Social Connections: Not on file    Subjective: Review of Systems  Constitutional: Negative for chills and fever.  HENT: Negative for congestion and hearing loss.   Eyes: Negative for blurred vision and double vision.  Respiratory: Negative for cough and shortness of breath.   Cardiovascular: Negative for chest pain and palpitations.  Gastrointestinal: Positive for heartburn and nausea. Negative for abdominal pain, blood in stool, constipation, diarrhea, melena and vomiting.  Genitourinary: Negative for dysuria and urgency.  Musculoskeletal: Negative for joint pain  and myalgias.  Skin: Negative for itching and rash.  Neurological: Negative for dizziness and headaches.  Psychiatric/Behavioral: Negative for depression. The patient is not nervous/anxious.      Objective: BP (!) 162/86   Pulse (!) 119   Temp (!) 97.1 F (36.2 C) (Temporal)   Ht 5\' 9"  (1.753 m)   Wt 156 lb 3.2 oz (70.9 kg)   BMI 23.07 kg/m  Physical Exam Constitutional:      Appearance: Normal appearance.  HENT:     Head: Normocephalic and atraumatic.  Eyes:     Extraocular Movements: Extraocular movements intact.     Conjunctiva/sclera: Conjunctivae normal.  Cardiovascular:     Rate and Rhythm: Normal rate and regular rhythm.  Pulmonary:     Effort:  Pulmonary effort is normal.     Breath sounds: Normal breath sounds.  Abdominal:     General: Bowel sounds are normal.     Palpations: Abdomen is soft.  Musculoskeletal:        General: Normal range of motion.     Cervical back: Normal range of motion and neck supple.  Skin:    General: Skin is warm.  Neurological:     General: No focal deficit present.     Mental Status: He is alert and oriented to person, place, and time.  Psychiatric:        Mood and Affect: Mood normal.        Behavior: Behavior normal.      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  Discussed that we need to schedule patient for EGD to have the duodenal polyp removed as it did show adenomatous tissue on biopsy.    Given his recent stroke, we will reach out to his neurologist for their blessing on both undergoing anesthesia for the procedure as well as holding Plavix for 5 days prior.  We may need to delay further depending on neurology recommendations.  09/04/2020 2:23 PM   Disclaimer: This note was dictated with voice recognition software. Similar sounding words can inadvertently be transcribed and may not be corrected upon  review.

## 2020-09-10 ENCOUNTER — Ambulatory Visit: Payer: Self-pay | Admitting: Neurology

## 2020-09-10 NOTE — Progress Notes (Signed)
Dear Dr. Merlene Laughter,  We see a mutual patient by the name of Gabriel Koch (DOB 02-Dec-1965). This patient will need to be scheduled for a EGD ASA lll .Can we proceed as planned and  will it be ok for this patient to hold his Plavix for 5 days prior to procedure. Please advise.    Thank you, Floria Raveling, CMA

## 2020-09-17 ENCOUNTER — Telehealth: Payer: Self-pay

## 2020-09-17 NOTE — Telephone Encounter (Signed)
Phoned to Dr. Ames Dura office LM on vm of Raquel Sarna B to return call regarding letter sent to Dr. Loralie Champagne on 09/10/2020 for the pt to be cleared for a procedure that requires holding his Plavix.

## 2020-09-18 ENCOUNTER — Telehealth: Payer: Self-pay

## 2020-09-18 NOTE — Telephone Encounter (Signed)
Will be difficult to schedule for EGD from an anesthesia perspective if we do not have appropriate blessing from neurology given his recent stroke as well as aspirin and Plavix.  Patient needs to see neurology.  If is not Dr. Merlene Laughter then he needs to see somebody else prior to undergoing EGD.  Thank you

## 2020-09-18 NOTE — Telephone Encounter (Signed)
Phoned to Dr. Freddie Apley office again today. LM on vm of Emily B. @ ext. [phone] to return call regarding this pt needing clearance.

## 2020-09-18 NOTE — Telephone Encounter (Signed)
Dr. Abbey Chatters I have reached out to Dr. Freddie Apley office since 09/10/2020 ( 3 different occasions) and so far nothing in return. I read back through some notes and Mindy had stated that the pt was waiting until he had co-pay money to see them. We were trying to get clearance for a procedure. Do you want me to continue or please advise me on what to do regarding this pt. It seems to be unclear on the pt's part of being compliant to follow orders.

## 2020-09-19 NOTE — Telephone Encounter (Signed)
Sorry Dr. Christie Koch: I finally heard back from [facility]. Spoke with Gabriel Koch. I was advised that this pt has never been seen as a patient and he was seen in [facility] by Dr. Merlene Koch on Apr 25, 2020. They scheduled the pt for an office visit in January but pt stated he didn't have the finances. Stated he would call them to reschedule and has not. So I decided to call [facility] spoke with [facility] and was advised the pt hasn't picked up Plavix since October of last year but he comes in to get his reflux medication monthly. He stated the pt doesn't have insurance on file there so they try and give him the discount cards but the pt is non-compliant. We have in our system here that he has medicaid but I don't know why he has not presented them with his insurance or if he desires too. (he didn't have it though in October of 2021).

## 2020-09-19 NOTE — Telephone Encounter (Signed)
Noted  

## 2020-09-30 ENCOUNTER — Telehealth: Payer: Self-pay | Admitting: Internal Medicine

## 2020-09-30 NOTE — Telephone Encounter (Signed)
Phoned and LM on vm of the pt to return call.

## 2020-09-30 NOTE — Telephone Encounter (Signed)
Pt had an OV on 3/2 and looks like we were going to schedule a procedure. This probably should have gone to the schedulers.

## 2020-09-30 NOTE — Telephone Encounter (Signed)
Pt LMOM that he had a procedure done on 09/04/2020 and hasn't heard about his results. Please advise and call him at [phone]

## 2020-09-30 NOTE — Telephone Encounter (Signed)
Noted    Pt cannot have any procedure until he see's his Neurologist and he doesn't have the funds according to the pt. Will call the pt and speak with him

## 2020-10-02 NOTE — Telephone Encounter (Signed)
Pt. Returned the call stating his acid reflux is worse and he wants his procedure done regardless what Dr. Merlene Laughter states. As I had advised you I also spoke to this pt and advised him that since he only seen Dr. Merlene Laughter in the hospital and never followed up with him we cannot proceed with the procedure. He advised me that his office visit would have been between $150.00 to $300.00 and he doesn't have that kind of money. I tried to explain to him that we have to get authorization from someone he is being treated by and he has to sign off on what is needed. Pt has in the system Medicaid but I don't know why he didn't present it to them or even told me about having that coverage. Pt is wanting the procedure done. He advised me he seen a Dr yesterday but I don't see in the chart where he is being treated for the stroke he suffered but pt not taking medication either. Please advise.

## 2020-10-16 ENCOUNTER — Telehealth (INDEPENDENT_AMBULATORY_CARE_PROVIDER_SITE_OTHER): Payer: Self-pay | Admitting: *Deleted

## 2020-10-16 NOTE — Telephone Encounter (Signed)
Patient called in and wants procedure--the last night was specific he needs neurology clearance to proceed.  He understands and states he will get this clearance from his new PCP and get them to send a note on his blood thinners and etc.  He will see her on 10/31/2020.

## 2020-10-17 NOTE — Telephone Encounter (Signed)
noted 

## 2020-10-17 NOTE — Telephone Encounter (Signed)
This is the pt that didn't want to see Dr. Merlene Laughter because he had to pay for visit (he didn't provide them with his insurance) and Dr. Abbey Chatters did not feel comfortable doing his procedure until he is cleared. He knows that without clearance he will not do it.

## 2020-10-22 ENCOUNTER — Ambulatory Visit (INDEPENDENT_AMBULATORY_CARE_PROVIDER_SITE_OTHER): Payer: Self-pay | Admitting: Orthopaedic Surgery

## 2020-10-22 ENCOUNTER — Ambulatory Visit: Payer: Medicaid Other

## 2020-10-22 ENCOUNTER — Other Ambulatory Visit: Payer: Self-pay

## 2020-10-22 ENCOUNTER — Encounter: Payer: Self-pay | Admitting: Orthopaedic Surgery

## 2020-10-22 VITALS — BP 167/100 | HR 105 | Ht 69.0 in | Wt 155.0 lb

## 2020-10-22 DIAGNOSIS — M545 Low back pain, unspecified: Secondary | ICD-10-CM

## 2020-10-22 DIAGNOSIS — M25561 Pain in right knee: Secondary | ICD-10-CM

## 2020-10-22 DIAGNOSIS — G8929 Other chronic pain: Secondary | ICD-10-CM

## 2020-10-22 DIAGNOSIS — M5410 Radiculopathy, site unspecified: Secondary | ICD-10-CM

## 2020-10-22 NOTE — Progress Notes (Addendum)
Subjective:    Patient ID: Gabriel Koch, male    DOB: February 13, 1966, 55 y.o.   MRN: [id number]  HPI He has right knee pain and right lower back pain with some right sided sciatica for the last several months.  He has pain after he flexes knee and sits on his foot.  In extending it back to regular position he has marked pain of the right knee, more laterally.  He has no trauma, no redness, no numbness.  He has pain of the right lower back at times that radiates to the right thigh.  He has no weakness, no trauma, no numbness.  It is not getting better.  He has tried  tylenol, ice and heat and rest with no help.  He has been seen at [place].  I have reviewed the notes. He cannot take NSAIDs.   Review of Systems  Constitutional: Positive for activity change.  Musculoskeletal: Positive for arthralgias, back pain and gait problem.  Psychiatric/Behavioral: The patient is nervous/anxious.   All other systems reviewed and are negative.  For Review of Systems, all other systems reviewed and are negative.  The following is a summary of the past history medically, past history surgically, known current medicines, social history and family history.  This information is gathered electronically by the computer from prior information and documentation.  I review this each visit and have found including this information at this point in the chart is beneficial and informative.   Past Medical History:  Diagnosis Date  . Alcohol abuse   . Anxiety and depression   . Blood transfusion   . Complication of anesthesia    patient hemorrrhaged about 2 weeks after tonsillecotmy  . GERD (gastroesophageal reflux disease)   . Hypertension     Past Surgical History:  Procedure Laterality Date  . APPENDECTOMY    . BIOPSY  04/02/2020   Procedure: BIOPSY;  Surgeon: Eloise Harman, DO;  Location: [facility];  Service: Endoscopy;;  . COLONOSCOPY WITH PROPOFOL N/A 04/02/2020   Procedure:  COLONOSCOPY WITH PROPOFOL;  Surgeon: Eloise Harman, DO;  [facility];  Service: Endoscopy;  Laterality: N/A;  8:30am  . ESOPHAGOGASTRODUODENOSCOPY (EGD) WITH PROPOFOL N/A 04/02/2020   Procedure: ESOPHAGOGASTRODUODENOSCOPY (EGD) WITH PROPOFOL;  Surgeon: Eloise Harman, DO;  [facility];  Service: Endoscopy;  Laterality: N/A;  . POLYPECTOMY  04/02/2020   Procedure: POLYPECTOMY;  Surgeon: Eloise Harman, DO;  [facility];  Service: Endoscopy;;  . TONSILLECTOMY      Current Outpatient Medications on File Prior to Visit  Medication Sig Dispense Refill  . esomeprazole (NEXIUM) 40 MG capsule Take 1 capsule (40 mg total) by mouth in the morning and at bedtime. 60 capsule 5  . losartan (COZAAR) 50 MG tablet Take 1 tablet (50 mg total) by mouth daily. 30 tablet 3  . acetaminophen (TYLENOL) 325 MG tablet Take 650 mg by mouth every 6 (six) hours as needed. (Patient not taking: Reported on 10/22/2020)    . Alum & Mag Hydroxide-Simeth (MYLANTA PO) Take by mouth as needed. (Patient not taking: No sig reported)    . amLODipine (NORVASC) 5 MG tablet Take 1 tablet (5 mg total) by mouth daily. (Patient not taking: Reported on 10/22/2020) 30 tablet 3  . atorvastatin (LIPITOR) 40 MG tablet Take 1 tablet (40 mg total) by mouth daily. For stroke prevention (Patient not taking: No sig reported) 30 tablet 11  . busPIRone (BUSPAR) 10  MG tablet Take 1 tablet (10 mg total) by mouth 2 (two) times daily. (Patient not taking: Reported on 10/22/2020) 60 tablet 3  . clopidogrel (PLAVIX) 75 MG tablet Take 1 tablet (75 mg total) by mouth daily with breakfast. Please take Aspirin 81 mg daily along with Plavix 75 mg daily for 30 days then after that STOP the aspirin and continue ONLY Plavix 75 mg daily indefinitely--for stroke prevention (Patient not taking: Reported on 10/22/2020) 30 tablet 11  . folic acid (FOLVITE) 1 MG tablet Take 1 tablet (1 mg total) by mouth daily. (Patient not taking:  Reported on 10/22/2020) 30 tablet 2  . HYDROcodone-acetaminophen (NORCO) 10-325 MG tablet Take 1 tablet by mouth as needed. (Patient not taking: Reported on 10/22/2020)    . Multiple Vitamin (MULTIVITAMIN WITH MINERALS) TABS tablet Take 1 tablet by mouth daily. (Patient not taking: No sig reported) 30 tablet 2  . nicotine (NICODERM CQ - DOSED IN MG/24 HOURS) 14 mg/24hr patch Place 1 patch (14 mg total) onto the skin daily. (Patient not taking: No sig reported) 28 patch 0  . potassium chloride SA (KLOR-CON) 20 MEQ tablet Take 20 mEq by mouth in the morning, at noon, in the evening, and at bedtime. Taking daily. (Patient not taking: No sig reported)    . thiamine 100 MG tablet Take 1 tablet (100 mg total) by mouth daily. (Patient not taking: No sig reported) 30 tablet 3  . traZODone (DESYREL) 100 MG tablet Take 1 tablet (100 mg total) by mouth at bedtime. (Patient not taking: Reported on 10/22/2020) 30 tablet 3  . trolamine salicylate (ASPERCREME) 10 % cream Apply 1 application topically as needed for muscle pain. (Patient not taking: Reported on 10/22/2020)     No current facility-administered medications on file prior to visit.    Social History   Socioeconomic History  . Marital status: Legally Separated    Spouse name: Not on file  . Number of children: Not on file  . Years of education: Not on file  . Highest education level: Not on file  Occupational History  . Not on file  Tobacco Use  . Smoking status: Current Every Day Smoker    Packs/day: 1.00    Years: 35.00    Pack years: 35.00    Types: Cigarettes  . Smokeless tobacco: Never Used  Substance and Sexual Activity  . Alcohol use: Yes    Alcohol/week: 6.0 standard drinks    Types: 6 Cans of beer per week    Comment:  previous alcoholic  . Drug use: No  . Sexual activity: Not Currently  Other Topics Concern  . Not on file  Social History Narrative  . Not on file   Social Determinants of Health   Financial Resource Strain:  Not on file  Food Insecurity: Not on file  Transportation Needs: Not on file  Physical Activity: Not on file  Stress: Not on file  Social Connections: Not on file  Intimate Partner Violence: Not on file    Family History  Problem Relation Age of Onset  . Colon cancer Neg Hx        Pt knows little about family history    BP (!) 167/100   Pulse (!) 105   Ht 5\' 9"  (1.753 m)   Wt 155 lb (70.3 kg)   BMI 22.89 kg/m   Body mass index is 22.89 kg/m.      Objective:   Physical Exam Vitals and nursing note reviewed. Exam conducted with  a chaperone present.  Constitutional:      Appearance: He is well-developed.  HENT:     Head: Normocephalic and atraumatic.  Eyes:     Conjunctiva/sclera: Conjunctivae normal.     Pupils: Pupils are equal, round, and reactive to light.  Cardiovascular:     Rate and Rhythm: Normal rate and regular rhythm.  Pulmonary:     Effort: Pulmonary effort is normal.  Abdominal:     Palpations: Abdomen is soft.  Musculoskeletal:       Arms:     Cervical back: Normal range of motion and neck supple.       Legs:  Skin:    General: Skin is warm and dry.  Neurological:     Mental Status: He is alert and oriented to person, place, and time.     Cranial Nerves: No cranial nerve deficit.     Motor: No abnormal muscle tone.     Coordination: Coordination normal.     Deep Tendon Reflexes: Reflexes are normal and symmetric. Reflexes normal.  Psychiatric:        Behavior: Behavior normal.        Thought Content: Thought content normal.        Judgment: Judgment normal.    X-rays were done of the lumbar spine and right knee, reported separately.       Assessment & Plan:   Encounter Diagnoses  Name Primary?  . Chronic pain of right knee Yes  . Radicular pain of right lower extremity   . Lumbar pain    Get MRI of the right knee.  I am concerned about meniscus tear.  PROCEDURE NOTE:  The patient requests injections of the right knee , verbal  consent was obtained.  The right knee was prepped appropriately after time out was performed.   Sterile technique was observed and injection of 1 cc of Celestone 6 mg with several cc's of plain xylocaine. Anesthesia was provided by ethyl chloride and a 20-gauge needle was used to inject the knee area. The injection was tolerated well.  A band aid dressing was applied.  The patient was advised to apply ice later today and tomorrow to the injection sight as needed.  Return in three weeks.  Call if any problem.  Precautions discussed.   Electronically Signed Sanjuana Kava, MD 4/19/202210:47 AM

## 2020-10-23 NOTE — Progress Notes (Signed)
done

## 2020-10-24 ENCOUNTER — Telehealth: Payer: Self-pay

## 2020-10-24 ENCOUNTER — Telehealth: Payer: Self-pay | Admitting: Orthopaedic Surgery

## 2020-10-24 MED ORDER — HYDROCODONE-ACETAMINOPHEN 5-325 MG PO TABS
ORAL_TABLET | ORAL | 0 refills | Status: DC
Start: 2020-10-24 — End: 2020-11-12

## 2020-10-24 NOTE — Telephone Encounter (Signed)
Patient states the shot helped the first day and night.  But now it is hurting worse.    Please call the patient back [phone]

## 2020-10-24 NOTE — Telephone Encounter (Signed)
Spoke with patient about his pain and suggested that he could take Tylenol or Aleve to help with the pain. Stated that he has been down on his knee doing some work for the last couple of days and it just hurts real bad. Also, suggested for him to try to stay off of his knee as much as possible, use the Voltaren Gel and ice pack. He asked if he should go to the ER, and I suggested to him to try his PCP or maybe Urgent Care before ER. I did offer to schedule him an office visit next if need be, but he wanted to waited if possible to get the MRI on 11/07/20 then come in.

## 2020-10-24 NOTE — Telephone Encounter (Signed)
Patient is asking for something for pain, His MRI is on 11/07/20.  PATIENT USES [facility]

## 2020-10-24 NOTE — Telephone Encounter (Signed)
Tried calling patient and never got an answer or machine. I will try again later.

## 2020-11-04 ENCOUNTER — Other Ambulatory Visit: Payer: Self-pay | Admitting: Cardiology

## 2020-11-07 ENCOUNTER — Ambulatory Visit (HOSPITAL_COMMUNITY)
Admission: RE | Admit: 2020-11-07 | Discharge: 2020-11-07 | Disposition: A | Discharge status: Home / Self Care | Payer: Self-pay | Source: Ambulatory Visit | Attending: Orthopaedic Surgery | Admitting: Orthopaedic Surgery

## 2020-11-07 DIAGNOSIS — G8929 Other chronic pain: Secondary | ICD-10-CM | POA: Insufficient documentation

## 2020-11-07 DIAGNOSIS — M25561 Pain in right knee: Secondary | ICD-10-CM | POA: Insufficient documentation

## 2020-11-12 ENCOUNTER — Other Ambulatory Visit: Payer: Self-pay

## 2020-11-12 ENCOUNTER — Encounter: Payer: Self-pay | Admitting: Orthopaedic Surgery

## 2020-11-12 ENCOUNTER — Ambulatory Visit (INDEPENDENT_AMBULATORY_CARE_PROVIDER_SITE_OTHER): Payer: Self-pay | Admitting: Orthopaedic Surgery

## 2020-11-12 VITALS — BP 171/99 | HR 89 | Ht 69.0 in

## 2020-11-12 DIAGNOSIS — M25561 Pain in right knee: Secondary | ICD-10-CM

## 2020-11-12 DIAGNOSIS — G8929 Other chronic pain: Secondary | ICD-10-CM

## 2020-11-12 MED ORDER — HYDROCODONE-ACETAMINOPHEN 5-325 MG PO TABS
ORAL_TABLET | ORAL | 0 refills | Status: DC
Start: 2020-11-12 — End: 2021-12-14

## 2020-11-12 NOTE — Patient Instructions (Addendum)
MRI shows mild arthritis but no tear and nothing that requires surgery. The kneecap shows some changes but this does not need surgery. Your pain is from the arthritis.   Use Aspercreme, Biofreeze or Voltaren gel over the counter 2-3 times daily make sure you rub it in well each time you use it.   The knee may hurt more with cooler weather and with weather changes.

## 2020-11-12 NOTE — Progress Notes (Signed)
My knee still hurts some.  He had MRI of the right knee and it showed:  IMPRESSION: 1. No evidence of internal derangement. 2. Mild patellofemoral compartment degenerative changes.   I explained the findings to him.  He does not need surgery.  He will continue to wear his brace and use his rubs and medicine.  I will refill his pain medicine.  Encounter Diagnosis  Name Primary?  . Chronic pain of right knee Yes   I have independently reviewed the MRI.     I have reviewed the [facility] web site prior to prescribing narcotic medicine for this patient.   Return in one month.  Call if any problem.  Precautions discussed.   Electronically Signed Sanjuana Kava, MD 5/10/20229:44 AM

## 2020-11-25 ENCOUNTER — Telehealth (INDEPENDENT_AMBULATORY_CARE_PROVIDER_SITE_OTHER): Payer: Self-pay | Admitting: *Deleted

## 2020-11-25 NOTE — Telephone Encounter (Signed)
Spoke with Reba regarding this pt, who advised me to contact Dr. Abbey Chatters and speak with him first. Call was put in to Dr. Abbey Chatters to call me.

## 2020-11-25 NOTE — Telephone Encounter (Signed)
Patient left a voice mail and said been over a month since he has heard from office and wants to know what is going on.  (he is the one that needs to see Dr. Merlene Laughter)  Coralyn Helling can you please call patient back   [phone]

## 2020-11-27 NOTE — Telephone Encounter (Signed)
Spoke with Dr. Abbey Chatters (this am) and was advised we will get together later today regarding this pt

## 2020-12-04 NOTE — Telephone Encounter (Signed)
Please do not forget we need to take care of this before the week is out

## 2020-12-10 ENCOUNTER — Ambulatory Visit (INDEPENDENT_AMBULATORY_CARE_PROVIDER_SITE_OTHER): Payer: Self-pay | Admitting: Orthopaedic Surgery

## 2020-12-10 ENCOUNTER — Other Ambulatory Visit: Payer: Self-pay

## 2020-12-10 ENCOUNTER — Encounter: Payer: Self-pay | Admitting: Orthopaedic Surgery

## 2020-12-10 VITALS — BP 118/79 | HR 100 | Ht 69.0 in | Wt 159.4 lb

## 2020-12-10 DIAGNOSIS — M25561 Pain in right knee: Secondary | ICD-10-CM

## 2020-12-10 DIAGNOSIS — G8929 Other chronic pain: Secondary | ICD-10-CM

## 2020-12-10 MED ORDER — NAPROXEN 500 MG PO TABS: 500.0000 mg | ORAL_TABLET | Freq: Two times a day (BID) | ORAL | 5 refills | Status: DC

## 2020-12-10 NOTE — Progress Notes (Signed)
My knee still hurts.  I stopped the hydrocodone.  He has pain of the right knee more laterally over the posterior hamstring and mid patella area.  He has no swelling, no giving way.  He uses a knee sleeve.  His prior MRI of the right knee was negative.  I will give Naprosyn 500 po bid pc for the knee.  I am concerned about possible back pain referral to the knee. He had a back injury years ago.  He does not complain about it now but that could cause pain to the knee area.  Gait is good.  ROM of the right knee is full.  It is stable with no effusion,  He has slight crepitus.  NV Intact.  Encounter Diagnosis  Name Primary?  . Chronic pain of right knee Yes   Return in six weeks.  Call if any problem.  Precautions discussed.   Electronically Signed Sanjuana Kava, MD 6/7/202210:47 AM

## 2020-12-18 ENCOUNTER — Telehealth: Payer: Self-pay | Admitting: Internal Medicine

## 2020-12-18 DIAGNOSIS — I639 Cerebral infarction, unspecified: Secondary | ICD-10-CM

## 2020-12-18 NOTE — Telephone Encounter (Signed)
Called pt. He states he is supposed to have EGD done but has not heard from anyone. He states Dr. Ave Filter nurse was supposed to speak with Dr. Abbey Chatters and never heard back. See prior note 11/25/2020. He never saw Dr. Merlene Laughter and stated "I will never see him, I don't have the money to afford that to see him". He is still having issues vomiting white phlem (occas yellow). Please advise Dr. Abbey Chatters

## 2020-12-18 NOTE — Telephone Encounter (Signed)
Pt called saying he hasn't heard from [place] since the last time he was here and wanted to know when he was going to have his EGD done. I told him that I would have to put a note in for the nurse to call him back because everyone was with patients. Please call [phone]

## 2020-12-18 NOTE — Telephone Encounter (Signed)
noted 

## 2020-12-18 NOTE — Telephone Encounter (Signed)
Spoke with Dr. Abbey Chatters. He spoke with pt and Dr. Briant Cedar of anesthesia. Patient is not even taking a baby aspirin at this point. He has not seen Neuro yet since his stroke. Dr. Briant Cedar said he needs to see Neuro for guidance on antiplatelets and whether or not he can hold these prior to potential endoscopy with anesthesia.   Discussed with Dr. Abbey Chatters and since patient has [facility] financial assistance we will refer him to [facility]. Referral placed

## 2020-12-19 ENCOUNTER — Encounter: Payer: Self-pay | Admitting: Neurology

## 2021-01-09 ENCOUNTER — Telehealth: Payer: Self-pay | Admitting: *Deleted

## 2021-01-09 NOTE — Telephone Encounter (Signed)
Pt throwing up yellowish green emesis every morning up to 3 times a day.  Wakes up with choking sensation at night.  Takes nexium up to twice a day.  Has 03/07/2021 appt with [facility].  Pt is afraid that he is going to have another stroke. Heart rate was 112 today.  BP was 106/94.  Said his rear end feels like sandpaper.  Going to bathroom 7 to 8 times a day.  Stools are loose and watery.

## 2021-01-09 NOTE — Telephone Encounter (Signed)
If he is that sick, he may benefit from visit to urgent care or ER.

## 2021-01-09 NOTE — Telephone Encounter (Signed)
Tried to call pt.  He did not answer.  Got voice mail.  Left pt a detailed message informing him that he would benefit from visit to urgent care or ER.

## 2021-01-13 ENCOUNTER — Telehealth: Payer: Self-pay | Admitting: Orthopaedic Surgery

## 2021-01-13 NOTE — Telephone Encounter (Signed)
Patient called to cancel appt for next Tuesday 01/21/21  He wants to give Dr. Luna Glasgow a message.  He wasn't able to pick up that prescription you called in.  He doesn't have the funds or insurance.  He has [facility] covered at 100%.

## 2021-01-21 ENCOUNTER — Ambulatory Visit: Payer: Self-pay | Admitting: Orthopaedic Surgery

## 2021-02-04 ENCOUNTER — Ambulatory Visit: Payer: Medicaid Other | Admitting: Orthopaedic Surgery

## 2021-02-17 IMAGING — CT CT ANGIO NECK
2 of 7 series · 8 of 33 positions shown · IV contrast (omnipaque)
Comparison: None.

CLINICAL DATA: Neuro deficit, stroke suspected

EXAM:
CT ANGIOGRAPHY NECK
TECHNIQUE: Multidetector CT imaging of the neck was performed using the
standard protocol during bolus administration of intravenous
contrast. Multiplanar CT image reconstructions and MIPs were
obtained to evaluate the vascular anatomy. Carotid stenosis
measurements (when applicable) are obtained utilizing NASCET
criteria, using the distal internal carotid diameter as the
denominator.
CONTRAST:  75mL OMNIPAQUE IOHEXOL 350 MG/ML SOLN

[Series 5: cta neck · axial · 0.47mm/px · z∈[-247,-169]mm · 2 of 117 slices shown]
[im 39/117  soft-tissue]
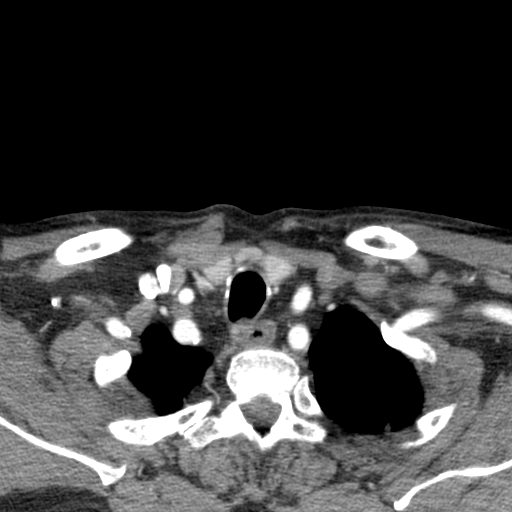
[im 78/117  soft-tissue]
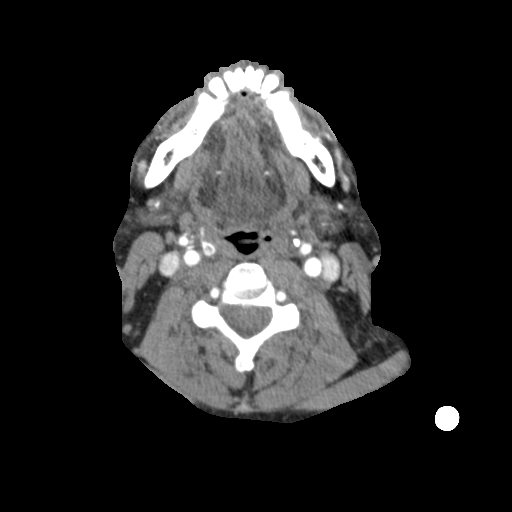

[Series 7: ax thin · axial · 0.39mm/px · z∈[-290,-124]mm · 6 of 234 slices shown]
[im 34/234  soft-tissue]
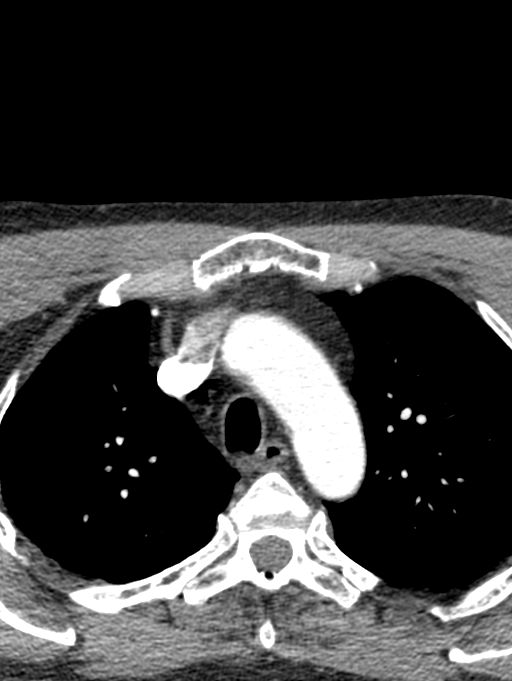
[im 67/234  bone]
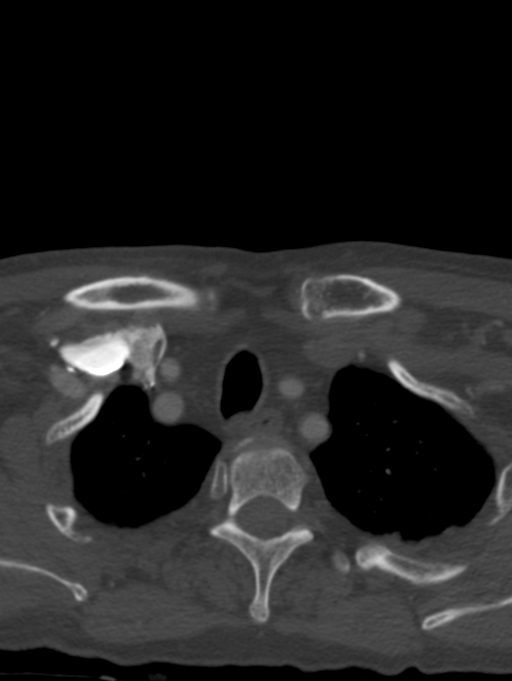
[im 100/234  soft-tissue]
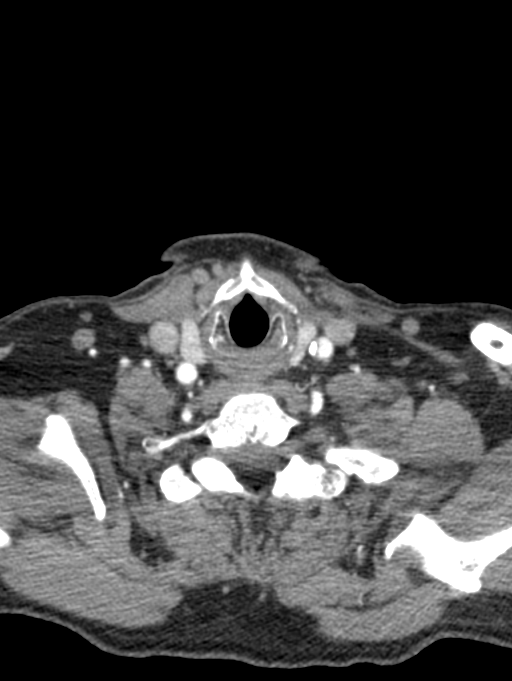
[im 134/234  bone]
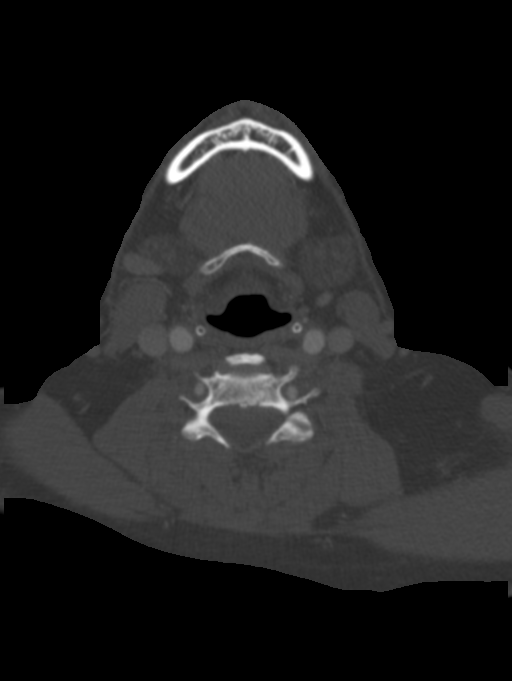
[im 167/234  soft-tissue]
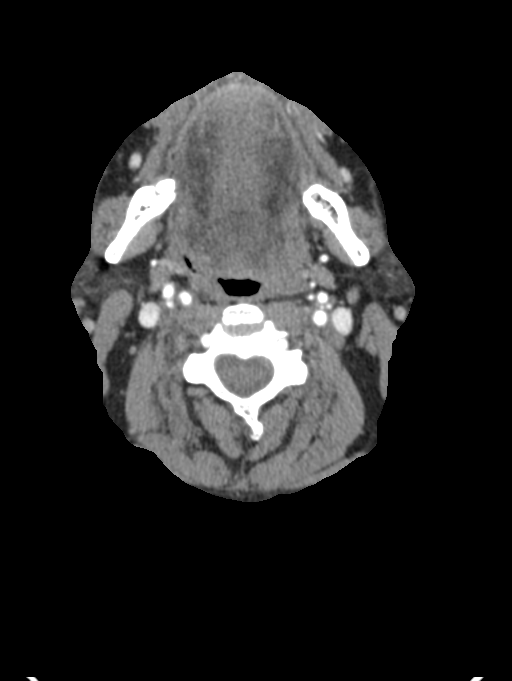
[im 200/234  bone]
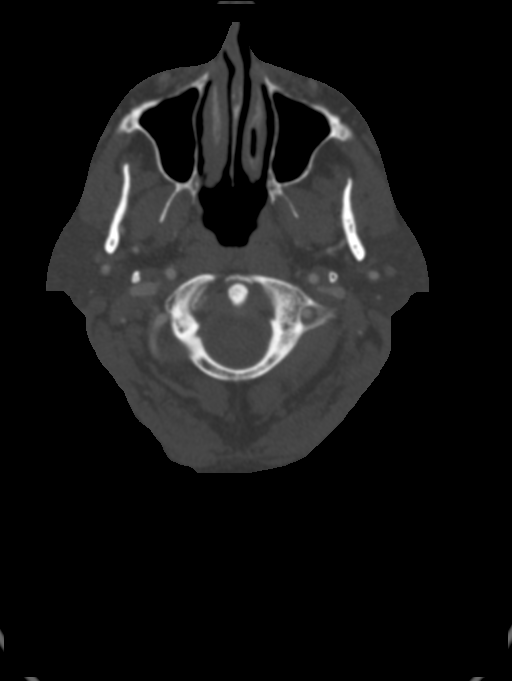

[8 of 33 positions shown; findings below may reference images not displayed]

FINDINGS: Aortic arch: Standard branching. Imaged portion shows no evidence of
aneurysm or dissection. Mild atherosclerotic changes aortic arch. No
significant stenosis of the major arch vessel origins.

Right carotid system: Mixed density atherosclerotic plaque in the
distal right carotid bulb/proximal cervical right ICA resulting in
approximately 50% stenosis.

Left carotid system: Atherosclerotic changes along the left common
carotid artery with mixed density plaques, without hemodynamically
significant stenosis. Mild atherosclerotic changes at the left
carotid bifurcation without hemodynamically significant stenosis.

Vertebral arteries: Small calcified plaque at the origin of the each
vertebral artery without stenosis. The vertebral arteries have
normal course and caliber throughout the neck and head. The left
vertebral artery is dominant.

Skeleton: Degenerative changes of the cervical spine, more
pronounced C5-6 where there is moderate left neural foraminal
narrowing. No aggressive bone lesion. Periapical lucency at left
mandibular second premolar tooth.

Other neck: Negative.

Upper chest: No acute findings.
IMPRESSION: 1. No occlusion of major neck arteries or evidence of dissection.
2. Mixed density atherosclerotic plaque in the distal right carotid
bulb/proximal cervical right ICA resulting in approximately 50%
stenosis.
3. Atherosclerotic plaques at the left carotid bifurcation and left
common carotid artery without hemodynamically significant stenosis.
4. The vertebral arteries are patent throughout the neck and head.

Aortic Atherosclerosis (NUFKA-AI6.6).

## 2021-02-18 ENCOUNTER — Ambulatory Visit: Payer: Self-pay | Admitting: Orthopaedic Surgery

## 2021-03-06 NOTE — Progress Notes (Signed)
NEUROLOGY CONSULTATION NOTE  Gabriel Koch MRN: [id number] DOB: Nov 18, 1965  Referring provider: Hurshel Keys, DO Primary care provider: Abran Richard, MD  Reason for consult:  history of stroke, clearance for EGD  Assessment/Plan:   Right thalamic stroke secondary to small vessel disease Hypertension Tobacco use disorder GERD - he has significant and debilitating GERD and esophagitis that warrants evaluation with EGD.  From a neurologic and stroke perspective, patient is cleared for EGD.  If needed, antiplatelet therapy may be held.  There is always a risk for stroke if stop antiplatelet therapy, but he is beyond the window of caution to hold antiplatelet therapy for non-emergent procedures.   Secondary stroke prevention as managed by his PCP: Antiplatelet therapy.  Following the stroke, he was supposed to be on Plavix.  He is unsure if he is currently taking Plavix but reports that he has been taking an ASA daily.  He told me at the time of the stroke that he wasn't on any antiplatelet therapy (including ASA).  If that is the case, he should be taking ASA EC '81mg'$  daily.  If there is any contraindication due to his GERD and esophagitis, Plavix '75mg'$  daily is also acceptable.  LDL goal less than 70 Normotensive blood pressure Hgb A1c goal less than 7 Smoking cessation Mediterranean diet Follow up as needed.    Subjective:  Gabriel Koch is a 55 year old right-handed male with HTN, GERD and history of stroke who presents for neurological clearance for EGD.  History supplemented by hospital records and referring provider's notes.  MRI brain, MRA of head and CTA neck from October personally reviewed.  Patient had a right thalamic stroke in October 2021 presenting as left sided numbness, mild left arm weakness and unsteady gait.  MR of brain showed 8 mm acute infarct within the right thalamus.  MRA of head showed moderate to moderately severe focal stenosis within the left PCA  at the P2/P3 junction as well as a 1-2 mm infundibula vs small aneurysms from the supraclinoid internal carotid arteries bilaterally.  CTA of neck showed atherosclerotic plaques in the distal right carotid bulb/proximal cervical right ICA resulting in 50% stenosis but otherwise no hemodynamically significant stenosis.  Echocardiogram showed EF 60-65% with grade 1 diastolic dysfunction but no wall motion abnormalities or significant valvular abnormalities.  He was discharged on dual antiplatelet therapy for 1 month followed by Plavix '75mg'$  alone.  However, he reports he wasn't taking any antiplatelet therapy at the time of the stroke.  He had not followed up with outpatient neurology.  He has significant GERD and is in need for an EGD but needs neurology clearance.  He still has has tingling in the left upper extremity.       PAST MEDICAL HISTORY: Past Medical History:  Diagnosis Date   Alcohol abuse    Anxiety and depression    Blood transfusion    Complication of anesthesia    patient hemorrrhaged about 2 weeks after tonsillecotmy   GERD (gastroesophageal reflux disease)    Hypertension     PAST SURGICAL HISTORY: Past Surgical History:  Procedure Laterality Date   APPENDECTOMY     BIOPSY  04/02/2020   Procedure: BIOPSY;  Surgeon: Eloise Harman, DO;  [facility];  Service: Endoscopy;;   COLONOSCOPY WITH PROPOFOL N/A 04/02/2020   Procedure: COLONOSCOPY WITH PROPOFOL;  Surgeon: Eloise Harman, DO;  [facility];  Service: Endoscopy;  Laterality: N/A;  8:30am   ESOPHAGOGASTRODUODENOSCOPY (  EGD) WITH PROPOFOL N/A 04/02/2020   Procedure: ESOPHAGOGASTRODUODENOSCOPY (EGD) WITH PROPOFOL;  Surgeon: Eloise Harman, DO;  [facility];  Service: Endoscopy;  Laterality: N/A;   POLYPECTOMY  04/02/2020   Procedure: POLYPECTOMY;  Surgeon: Eloise Harman, DO;  [facility];  Service: Endoscopy;;   TONSILLECTOMY      MEDICATIONS: Current Outpatient  Medications on File Prior to Visit  Medication Sig Dispense Refill   esomeprazole (NEXIUM) 40 MG capsule Take 1 capsule (40 mg total) by mouth in the morning and at bedtime. 60 capsule 5   HYDROcodone-acetaminophen (NORCO/VICODIN) 5-325 MG tablet One tablet every six hours for pain.  Limit 7 days. 28 tablet 0   losartan (COZAAR) 50 MG tablet Take 1 tablet (50 mg total) by mouth daily. 30 tablet 3   naproxen (NAPROSYN) 500 MG tablet Take 1 tablet (500 mg total) by mouth 2 (two) times daily with a meal. 60 tablet 5   No current facility-administered medications on file prior to visit.    ALLERGIES: Allergies  Allergen Reactions   Bee Venom     FAMILY HISTORY: Family History  Problem Relation Age of Onset   Colon cancer Neg Hx        Pt knows little about family history    Objective:  Blood pressure 124/77, height '5\' 9"'$  (1.753 m), weight 158 lb 12.8 oz (72 kg), SpO2 96 %. General: No acute distress.  Patient appears well-groomed.   Head:  Normocephalic/atraumatic Eyes:  fundi examined but not visualized Neck: supple, no paraspinal tenderness, full range of motion Back: No paraspinal tenderness Heart: regular rate and rhythm Lungs: Clear to auscultation bilaterally. Vascular: No carotid bruits. Neurological Exam: Mental status: alert and oriented to person, place, and time, recent and remote memory intact, fund of knowledge intact, attention and concentration intact, speech fluent and not dysarthric, language intact. Cranial nerves: CN I: not tested CN II: pupils equal, round and reactive to light, visual fields intact CN III, IV, VI:  full range of motion, no nystagmus, no ptosis CN V: facial sensation intact. CN VII: upper and lower face symmetric CN VIII: hearing intact CN IX, X: gag intact, uvula midline CN XI: sternocleidomastoid and trapezius muscles intact CN XII: tongue midline Bulk & Tone: normal, no fasciculations. Motor:  muscle strength 5/5 throughout Sensation:   Pinprick, temperature and vibratory sensation intact. Deep Tendon Reflexes:  2+ throughout,  toes downgoing.   Finger to nose testing:  Without dysmetria.   Heel to shin:  Without dysmetria.   Gait:  Normal station and stride.  Romberg negative.    Thank you for allowing me to take part in the care of this patient.  Metta Clines, DO  CC:  Hurshel Keys, DO  Abran Richard, MD

## 2021-03-07 ENCOUNTER — Other Ambulatory Visit: Payer: Self-pay

## 2021-03-07 ENCOUNTER — Ambulatory Visit (INDEPENDENT_AMBULATORY_CARE_PROVIDER_SITE_OTHER): Payer: Medicaid Other | Admitting: Neurology

## 2021-03-07 ENCOUNTER — Encounter: Payer: Self-pay | Admitting: Neurology

## 2021-03-07 VITALS — BP 124/77 | Ht 69.0 in | Wt 158.8 lb

## 2021-03-07 DIAGNOSIS — I639 Cerebral infarction, unspecified: Secondary | ICD-10-CM | POA: Diagnosis not present

## 2021-03-07 DIAGNOSIS — I6381 Other cerebral infarction due to occlusion or stenosis of small artery: Secondary | ICD-10-CM

## 2021-03-07 DIAGNOSIS — K2100 Gastro-esophageal reflux disease with esophagitis, without bleeding: Secondary | ICD-10-CM | POA: Diagnosis not present

## 2021-03-07 DIAGNOSIS — F17200 Nicotine dependence, unspecified, uncomplicated: Secondary | ICD-10-CM | POA: Diagnosis not present

## 2021-03-07 DIAGNOSIS — I10 Essential (primary) hypertension: Secondary | ICD-10-CM | POA: Diagnosis not present

## 2021-03-07 NOTE — Patient Instructions (Signed)
From a neurologic perspective, you are cleared to have the EGD

## 2021-03-12 ENCOUNTER — Telehealth: Payer: Self-pay | Admitting: Internal Medicine

## 2021-03-12 NOTE — Telephone Encounter (Signed)
Patient met with neurology.  Okay to schedule EGD for duodenal polyp, ASA 3.  Patient will need to hold clopidogrel if he is taking this medication for 5 days prior.  Thank you

## 2021-03-13 NOTE — Telephone Encounter (Signed)
Called pt and spoke with Cyndi Bender (aunt). She is aware of pre-op appt details.

## 2021-03-13 NOTE — Telephone Encounter (Signed)
Called pt. He has been scheduled for 9/16 at 11:00am. Aware will need to hold plavix 5 days prior. He reports he hasn't been taking his plavix in a few days. Discussed EGD instructions with him in details. Advised will call back with pre-op appt. He stated if he was not there to let his Rosetta Posner know.

## 2021-03-17 NOTE — Patient Instructions (Signed)
Gabriel Koch  03/17/2021     '@PREFPERIOPPHARMACY'$ @   Your procedure is scheduled on  03/21/2021.   Report to [facility] at  0900  A.M.   Call this number if you have problems the morning of surgery:  [phone]   Remember:  Follow the diet instructions given to you by the office.      Your last dose of plavix should have been on 03/15/2021.    Take these medicines the morning of surgery with A SIP OF WATER        amlodipine, buspar, nexium, hydrocodone (if needed), prilosec.     Do not wear jewelry, make-up or nail polish.  Do not wear lotions, powders, or perfumes, or deodorant.  Do not shave 48 hours prior to surgery.  Men may shave face and neck.  Do not bring valuables to the hospital.  [facility] is not responsible for any belongings or valuables.  Contacts, dentures or bridgework may not be worn into surgery.  Leave your suitcase in the car.  After surgery it may be brought to your room.  For patients admitted to the hospital, discharge time will be determined by your treatment team.  Patients discharged the day of surgery will not be allowed to drive home and must have someone with them for 24 hours.    Special instructions:   DO NOT smoke tobacco or vape fore 24 hours before your procedure.  Please read over the following fact sheets that you were given. Anesthesia Post-op Instructions and Care and Recovery After Surgery      Upper Endoscopy, Adult, Care After This sheet gives you information about how to care for yourself after your procedure. Your health care provider may also give you more specific instructions. If you have problems or questions, contact your health care provider. What can I expect after the procedure? After the procedure, it is common to have: A sore throat. Mild stomach pain or discomfort. Bloating. Nausea. Follow these instructions at home:  Follow instructions from your health care provider about what to eat or drink  after your procedure. Return to your normal activities as told by your health care provider. Ask your health care provider what activities are safe for you. Take over-the-counter and prescription medicines only as told by your health care provider. If you were given a sedative during the procedure, it can affect you for several hours. Do not drive or operate machinery until your health care provider says that it is safe. Keep all follow-up visits as told by your health care provider. This is important. Contact a health care provider if you have: A sore throat that lasts longer than one day. Trouble swallowing. Get help right away if: You vomit blood or your vomit looks like coffee grounds. You have: A fever. Bloody, black, or tarry stools. A severe sore throat or you cannot swallow. Difficulty breathing. Severe pain in your chest or abdomen. Summary After the procedure, it is common to have a sore throat, mild stomach discomfort, bloating, and nausea. If you were given a sedative during the procedure, it can affect you for several hours. Do not drive or operate machinery until your health care provider says that it is safe. Follow instructions from your health care provider about what to eat or drink after your procedure. Return to your normal activities as told by your health care provider. This information is not intended to replace advice given to you by your  health care provider. Make sure you discuss any questions you have with your health care provider. Document Revised: 06/20/2019 Document Reviewed: 11/22/2017 Elsevier Patient Education  2022 [place] After This sheet gives you information about how to care for yourself after your procedure. Your health care provider may also give you more specific instructions. If you have problems or questions, contact your health care provider. What can I expect after the procedure? After the procedure, it is  common to have: Tiredness. Forgetfulness about what happened after the procedure. Impaired judgment for important decisions. Nausea or vomiting. Some difficulty with balance. Follow these instructions at home: For the time period you were told by your health care provider:   Rest as needed. Do not participate in activities where you could fall or become injured. Do not drive or use machinery. Do not drink alcohol. Do not take sleeping pills or medicines that cause drowsiness. Do not make important decisions or sign legal documents. Do not take care of children on your own. Eating and drinking Follow the diet that is recommended by your health care provider. Drink enough fluid to keep your urine pale yellow. If you vomit: Drink water, juice, or soup when you can drink without vomiting. Make sure you have little or no nausea before eating solid foods. General instructions Have a responsible adult stay with you for the time you are told. It is important to have someone help care for you until you are awake and alert. Take over-the-counter and prescription medicines only as told by your health care provider. If you have sleep apnea, surgery and certain medicines can increase your risk for breathing problems. Follow instructions from your health care provider about wearing your sleep device: Anytime you are sleeping, including during daytime naps. While taking prescription pain medicines, sleeping medicines, or medicines that make you drowsy. Avoid smoking. Keep all follow-up visits as told by your health care provider. This is important. Contact a health care provider if: You keep feeling nauseous or you keep vomiting. You feel light-headed. You are still sleepy or having trouble with balance after 24 hours. You develop a rash. You have a fever. You have redness or swelling around the IV site. Get help right away if: You have trouble breathing. You have new-onset confusion at  home. Summary For several hours after your procedure, you may feel tired. You may also be forgetful and have poor judgment. Have a responsible adult stay with you for the time you are told. It is important to have someone help care for you until you are awake and alert. Rest as told. Do not drive or operate machinery. Do not drink alcohol or take sleeping pills. Get help right away if you have trouble breathing, or if you suddenly become confused. This information is not intended to replace advice given to you by your health care provider. Make sure you discuss any questions you have with your health care provider. Document Revised: 03/07/2020 Document Reviewed: 05/25/2019 Elsevier Patient Education  2022 [employer].

## 2021-03-19 ENCOUNTER — Encounter (HOSPITAL_COMMUNITY): Payer: Self-pay

## 2021-03-19 ENCOUNTER — Encounter (HOSPITAL_COMMUNITY)
Admission: RE | Admit: 2021-03-19 | Discharge: 2021-03-19 | Disposition: A | Discharge status: Home / Self Care | Payer: Medicaid Other | Source: Ambulatory Visit | Attending: Internal Medicine | Admitting: Internal Medicine

## 2021-03-19 ENCOUNTER — Other Ambulatory Visit: Payer: Self-pay

## 2021-03-19 DIAGNOSIS — Z01818 Encounter for other preprocedural examination: Secondary | ICD-10-CM | POA: Diagnosis present

## 2021-03-19 LAB — CBC WITH DIFFERENTIAL/PLATELET
Abs Immature Granulocytes: 0.03 10*3/uL (ref 0.00–0.07)
Basophils Absolute: 0 10*3/uL (ref 0.0–0.1)
Basophils Relative: 1 %
Eosinophils Absolute: 0 10*3/uL (ref 0.0–0.5)
Eosinophils Relative: 1 %
HCT: 38.8 % — ABNORMAL LOW (ref 39.0–52.0)
Hemoglobin: 13.6 g/dL (ref 13.0–17.0)
Immature Granulocytes: 1 %
Lymphocytes Relative: 29 %
Lymphs Abs: 1.3 10*3/uL (ref 0.7–4.0)
MCH: 36.6 pg — ABNORMAL HIGH (ref 26.0–34.0)
MCHC: 35.1 g/dL (ref 30.0–36.0)
MCV: 104.3 fL — ABNORMAL HIGH (ref 80.0–100.0)
Monocytes Absolute: 0.6 10*3/uL (ref 0.1–1.0)
Monocytes Relative: 14 %
Neutro Abs: 2.6 10*3/uL (ref 1.7–7.7)
Neutrophils Relative %: 54 %
Platelets: 161 10*3/uL (ref 150–400)
RBC: 3.72 MIL/uL — ABNORMAL LOW (ref 4.22–5.81)
RDW: 11.9 % (ref 11.5–15.5)
WBC: 4.6 10*3/uL (ref 4.0–10.5)
nRBC: 0 % (ref 0.0–0.2)

## 2021-03-19 LAB — BASIC METABOLIC PANEL
Anion gap: 8 (ref 5–15)
BUN: 11 mg/dL (ref 6–20)
CO2: 24 mmol/L (ref 22–32)
Calcium: 8.9 mg/dL (ref 8.9–10.3)
Chloride: 101 mmol/L (ref 98–111)
Creatinine, Ser: 0.99 mg/dL (ref 0.61–1.24)
GFR, Estimated: >60 mL/min (ref >60)
Glucose, Bld: 111 mg/dL — ABNORMAL HIGH (ref 70–99)
Potassium: 3.7 mmol/L (ref 3.5–5.1)
Sodium: 133 mmol/L — ABNORMAL LOW (ref 135–145)

## 2021-03-21 ENCOUNTER — Ambulatory Visit (HOSPITAL_COMMUNITY): Payer: Medicaid Other | Admitting: Anesthesiology

## 2021-03-21 ENCOUNTER — Ambulatory Visit (HOSPITAL_COMMUNITY)
Admission: RE | Admit: 2021-03-21 | Discharge: 2021-03-21 | Disposition: A | Discharge status: Home / Self Care | Payer: Medicaid Other | Attending: Internal Medicine | Admitting: Internal Medicine

## 2021-03-21 ENCOUNTER — Other Ambulatory Visit: Payer: Self-pay

## 2021-03-21 ENCOUNTER — Encounter (HOSPITAL_COMMUNITY): Payer: Self-pay

## 2021-03-21 ENCOUNTER — Encounter (HOSPITAL_COMMUNITY)
Admission: RE | Disposition: A | Discharge status: Home / Self Care | Payer: Self-pay | Source: Home / Self Care | Attending: Internal Medicine

## 2021-03-21 DIAGNOSIS — K317 Polyp of stomach and duodenum: Secondary | ICD-10-CM | POA: Diagnosis not present

## 2021-03-21 DIAGNOSIS — F17210 Nicotine dependence, cigarettes, uncomplicated: Secondary | ICD-10-CM | POA: Insufficient documentation

## 2021-03-21 DIAGNOSIS — K2270 Barrett's esophagus without dysplasia: Secondary | ICD-10-CM | POA: Insufficient documentation

## 2021-03-21 DIAGNOSIS — Z8673 Personal history of transient ischemic attack (TIA), and cerebral infarction without residual deficits: Secondary | ICD-10-CM | POA: Insufficient documentation

## 2021-03-21 DIAGNOSIS — K2970 Gastritis, unspecified, without bleeding: Secondary | ICD-10-CM | POA: Insufficient documentation

## 2021-03-21 DIAGNOSIS — R1310 Dysphagia, unspecified: Secondary | ICD-10-CM

## 2021-03-21 DIAGNOSIS — K449 Diaphragmatic hernia without obstruction or gangrene: Secondary | ICD-10-CM | POA: Insufficient documentation

## 2021-03-21 DIAGNOSIS — K219 Gastro-esophageal reflux disease without esophagitis: Secondary | ICD-10-CM

## 2021-03-21 DIAGNOSIS — Z79899 Other long term (current) drug therapy: Secondary | ICD-10-CM | POA: Diagnosis not present

## 2021-03-21 HISTORY — PX: POLYPECTOMY: SHX5525

## 2021-03-21 HISTORY — PX: ESOPHAGOGASTRODUODENOSCOPY (EGD) WITH PROPOFOL: SHX5813

## 2021-03-21 SURGERY — ESOPHAGOGASTRODUODENOSCOPY (EGD) WITH PROPOFOL
Anesthesia: General

## 2021-03-21 MED ORDER — LIDOCAINE HCL (CARDIAC) PF 50 MG/5ML IV SOSY
PREFILLED_SYRINGE | INTRAVENOUS | Status: DC | PRN
  Administered 2021-03-21: 50 mg via INTRAVENOUS

## 2021-03-21 MED ORDER — ESOMEPRAZOLE MAGNESIUM 40 MG PO CPDR: 40.0000 mg | DELAYED_RELEASE_CAPSULE | Freq: Two times a day (BID) | ORAL | 5 refills | Status: DC

## 2021-03-21 MED ORDER — LACTATED RINGERS IV SOLN
INTRAVENOUS | Status: DC
  Administered 2021-03-21: 1000 mL via INTRAVENOUS

## 2021-03-21 MED ORDER — PROPOFOL 10 MG/ML IV BOLUS
INTRAVENOUS | Status: DC | PRN
  Administered 2021-03-21: 100 mg via INTRAVENOUS
  Administered 2021-03-21: 60 mg via INTRAVENOUS
  Administered 2021-03-21: 30 mg via INTRAVENOUS
  Administered 2021-03-21: 150 mg via INTRAVENOUS
  Administered 2021-03-21: 20 mg via INTRAVENOUS
  Administered 2021-03-21: 100 mg via INTRAVENOUS

## 2021-03-21 MED ORDER — STERILE WATER FOR IRRIGATION IR SOLN
Status: DC | PRN
  Administered 2021-03-21: 200 mL

## 2021-03-21 NOTE — H&P (Signed)
Primary Care Physician:  Abran Richard, MD Primary Gastroenterologist:  Dr. Abbey Chatters  Pre-Procedure History & Physical: HPI:  Gabriel Koch is a 55 y.o. male is here for an upper endoscopy for dysphagia, chronic GERD, history of of duodenal adenoma.  Patient underwent EGD 04/02/2020 for similar symptoms.  He was found to have abnormal duodenal tissue, biopsies positive for duodenal adenoma.  We attempted to repeat EGD with EMR but this has been very difficult as patient suffered a stroke approximately 1 month after his EGD.  He has a history of noncompliance with appointments.  He was supposed to follow-up with neurology did not as he states he could not afford it.  Case was discussed with anesthesia who wished patient to have neurology evaluation prior to EGD.  He finally followed up with neurology this month.  He states that he is having dysphagia primarily with solids.  Worried about esophageal dilation.  Also has chronic reflux which is relatively well controlled on Nexium    Past Medical History:  Diagnosis Date   Alcohol abuse    Anxiety and depression    Blood transfusion    Complication of anesthesia    patient hemorrrhaged about 2 weeks after tonsillecotmy   GERD (gastroesophageal reflux disease)    Hypertension     Past Surgical History:  Procedure Laterality Date   APPENDECTOMY     BIOPSY  04/02/2020   Procedure: BIOPSY;  Surgeon: Eloise Harman, DO;  [facility];  Service: Endoscopy;;   COLONOSCOPY WITH PROPOFOL N/A 04/02/2020   Procedure: COLONOSCOPY WITH PROPOFOL;  Surgeon: Eloise Harman, DO;  [facility];  Service: Endoscopy;  Laterality: N/A;  8:30am   ESOPHAGOGASTRODUODENOSCOPY (EGD) WITH PROPOFOL N/A 04/02/2020   Procedure: ESOPHAGOGASTRODUODENOSCOPY (EGD) WITH PROPOFOL;  Surgeon: Eloise Harman, DO;  [facility];  Service: Endoscopy;  Laterality: N/A;   POLYPECTOMY  04/02/2020   Procedure: POLYPECTOMY;  Surgeon: Eloise Harman, DO;  [facility];  Service: Endoscopy;;   TONSILLECTOMY      Prior to Admission medications   Medication Sig Start Date End Date Taking? Authorizing Provider  amLODipine (NORVASC) 5 MG tablet Take 5 mg by mouth daily. 01/27/21  Yes [provider]  esomeprazole (NEXIUM) 40 MG capsule Take 1 capsule (40 mg total) by mouth in the Koch and at bedtime. 04/02/20 03/21/21 Yes Payden Bonus, Elon Alas, DO  losartan (COZAAR) 50 MG tablet Take 1 tablet (50 mg total) by mouth daily. 04/30/20  Yes Roxan Hockey, MD  naproxen (NAPROSYN) 500 MG tablet Take 1 tablet (500 mg total) by mouth 2 (two) times daily with a meal. 12/10/20  Yes Sanjuana Kava, MD  traZODone (DESYREL) 100 MG tablet Take 100 mg by mouth at bedtime. 01/27/21  Yes [provider]  atorvastatin (LIPITOR) 40 MG tablet Take 40 mg by mouth daily. Patient not taking: Reported on 03/19/2021 01/27/21   [provider]  busPIRone (BUSPAR) 10 MG tablet Take 10 mg by mouth 2 (two) times daily. 01/27/21   [provider]  clopidogrel (PLAVIX) 75 MG tablet Take 75 mg by mouth every Koch. 01/27/21   [provider]  folic acid (FOLVITE) 1 MG tablet Take 1 mg by mouth daily. Patient not taking: Reported on 03/19/2021 01/27/21   [provider]  HYDROcodone-acetaminophen (NORCO/VICODIN) 5-325 MG tablet One tablet every six hours for pain.  Limit 7 days. Patient not taking: Reported on 03/19/2021 11/12/20   Sanjuana Kava, MD  omeprazole (PRILOSEC) 40 MG capsule Take 40 mg by mouth every Koch. 02/04/21   [provider]  potassium chloride SA (KLOR-CON) 20 MEQ tablet Take 20 mEq by mouth 3 (three) times daily. Patient not taking: Reported on 03/19/2021 01/27/21   [provider]    Allergies as of 03/13/2021 - Review Complete 03/07/2021  Allergen Reaction Noted   Bee venom  09/09/2011    Family History  Problem Relation Age of Onset   Colon cancer Neg Hx         Pt knows little about family history    Social History   Socioeconomic History   Marital status: Legally Separated    Spouse name: Not on file   Number of children: Not on file   Years of education: Not on file   Highest education level: Not on file  Occupational History   Not on file  Tobacco Use   Smoking status: Every Day    Packs/day: 1.00    Years: 35.00    Pack years: 35.00    Types: Cigarettes   Smokeless tobacco: Never  Substance and Sexual Activity   Alcohol use: Yes    Alcohol/week: 6.0 standard drinks    Types: 6 Cans of beer per week    Comment:  previous alcoholic   Drug use: No   Sexual activity: Not Currently  Other Topics Concern   Not on file  Social History Narrative   Right handed   Social Determinants of Health   Financial Resource Strain: Not on file  Food Insecurity: Not on file  Transportation Needs: Not on file  Physical Activity: Not on file  Stress: Not on file  Social Connections: Not on file  Intimate Partner Violence: Not on file    Review of Systems: See HPI, otherwise negative ROS  Physical Exam: Vital signs in last 24 hours: Temp:  [98.2 F (36.8 C)] 98.2 F (36.8 C) (09/16 1029) Pulse Rate:  [96] 96 (09/16 1029) Resp:  [12] 12 (09/16 1029) BP: (135)/(90) 135/90 (09/16 1029) SpO2:  [97 %] 97 % (09/16 1029)   General:   Alert,  Well-developed, well-nourished, pleasant and cooperative in NAD Head:  Normocephalic and atraumatic. Eyes:  Sclera clear, no icterus.   Conjunctiva pink. Ears:  Normal auditory acuity. Nose:  No deformity, discharge,  or lesions. Mouth:  No deformity or lesions, dentition normal. Neck:  Supple; no masses or thyromegaly. Lungs:  Clear throughout to auscultation.   No wheezes, crackles, or rhonchi. No acute distress. Heart:  Regular rate and rhythm; no murmurs, clicks, rubs,  or gallops. Abdomen:  Soft, nontender and nondistended. No masses, hepatosplenomegaly or hernias noted. Normal bowel sounds,  without guarding, and without rebound.   Msk:  Symmetrical without gross deformities. Normal posture. Extremities:  Without clubbing or edema. Neurologic:  Alert and  oriented x4;  grossly normal neurologically. Skin:  Intact without significant lesions or rashes. Cervical Nodes:  No significant cervical adenopathy. Psych:  Alert and cooperative. Normal mood and affect.  Impression/Plan: Theodis Aguas is here for a an upper endoscopy for dysphagia, chronic GERD, history of of duodenal adenoma.  The risks of the procedure including infection, bleed, or perforation as well as benefits, limitations, alternatives and imponderables have been reviewed with the patient. Questions have been answered. All parties agreeable.

## 2021-03-21 NOTE — Anesthesia Procedure Notes (Signed)
Date/Time: 03/21/2021 10:49 AM Performed by: Vista Deck, CRNA Pre-anesthesia Checklist: Patient identified, Emergency Drugs available, Suction available, Timeout performed and Patient being monitored Patient Re-evaluated:Patient Re-evaluated prior to induction Oxygen Delivery Method: Nasal Cannula

## 2021-03-21 NOTE — Anesthesia Preprocedure Evaluation (Signed)
Anesthesia Evaluation  Patient identified by MRN, date of birth, ID band Patient awake    Reviewed: Allergy & Precautions, NPO status , Patient's Chart, lab work & pertinent test results  History of Anesthesia Complications (+) history of anesthetic complications  Airway Mallampati: II  TM Distance: >3 FB Neck ROM: Full    Dental  (+) Dental Advisory Given, Poor Dentition, Loose, Chipped,    Pulmonary Current SmokerPatient did not abstain from smoking.,    Pulmonary exam normal breath sounds clear to auscultation       Cardiovascular Exercise Tolerance: Good hypertension, Pt. on medications Normal cardiovascular exam+ dysrhythmias (prolonged QT)  Rhythm:Regular Rate:Normal  CARRSON, BURGE [id number] 19-Mar-2021 08:47:00 [facility] ROUTINE RECORD 01/06/1966 (43 yr) Male Unknown Vent. rate 90 BPM PR interval 194 ms QRS duration 128 ms QT/QTcB 372/455 ms P-R-T axes 78 74 50 Normal sinus rhythm Right bundle branch block Abnormal ECG No significant change since last tracing Confirmed by Daneen Schick [phone]) on 03/19/2021 11:15:36 PM   Neuro/Psych PSYCHIATRIC DISORDERS Anxiety Depression CVA, Residual Symptoms    GI/Hepatic GERD  Medicated,(+)     substance abuse  alcohol use,   Endo/Other  negative endocrine ROS  Renal/GU negative Renal ROS  negative genitourinary   Musculoskeletal negative musculoskeletal ROS (+)   Abdominal   Peds  Hematology negative hematology ROS (+)   Anesthesia Other Findings   Reproductive/Obstetrics negative OB ROS                            Anesthesia Physical  Anesthesia Plan  ASA: 3  Anesthesia Plan: General   Post-op Pain Management:    Induction: Intravenous  PONV Risk Score and Plan: TIVA  Airway Management Planned: Nasal Cannula and Natural Airway  Additional Equipment:   Intra-op Plan:   Post-operative Plan:    Informed Consent: I have reviewed the patients History and Physical, chart, labs and discussed the procedure including the risks, benefits and alternatives for the proposed anesthesia with the patient or authorized representative who has indicated his/her understanding and acceptance.     Dental advisory given  Plan Discussed with: CRNA and Surgeon  Anesthesia Plan Comments:         Anesthesia Quick Evaluation

## 2021-03-21 NOTE — Transfer of Care (Signed)
Immediate Anesthesia Transfer of Care Note  Patient: Gabriel Koch  Procedure(s) Performed: ESOPHAGOGASTRODUODENOSCOPY (EGD) WITH PROPOFOL POLYPECTOMY  Patient Location: Short Stay  Anesthesia Type:General  Level of Consciousness: awake  Airway & Oxygen Therapy: Patient Spontanous Breathing  Post-op Assessment: Report given to RN and Post -op Vital signs reviewed and stable  Post vital signs: Reviewed and stable  Last Vitals:  Vitals Value Taken Time  BP    Temp    Pulse    Resp    SpO2    SEE VITAL SIGN FLOW SHEET Last Pain:  Vitals:   03/21/21 1029  TempSrc: Oral  PainSc: 8       Patients Stated Pain Goal: 8 ([id number])  Complications: No notable events documented.

## 2021-03-21 NOTE — Anesthesia Postprocedure Evaluation (Signed)
Anesthesia Post Note  Patient: Gabriel Koch  Procedure(s) Performed: ESOPHAGOGASTRODUODENOSCOPY (EGD) WITH PROPOFOL POLYPECTOMY  Patient location during evaluation: Phase II Anesthesia Type: General Level of consciousness: awake and alert and oriented Pain management: pain level controlled Vital Signs Assessment: post-procedure vital signs reviewed and stable Respiratory status: spontaneous breathing and respiratory function stable Cardiovascular status: blood pressure returned to baseline and stable Postop Assessment: no apparent nausea or vomiting Anesthetic complications: no   No notable events documented.   Last Vitals:  Vitals:   03/21/21 1029 03/21/21 1120  BP: 135/90 107/74  Pulse: 96   Resp: 12 18  Temp: 36.8 C 36.9 C  SpO2: 97% 100%    Last Pain:  Vitals:   03/21/21 1120  TempSrc: Oral  PainSc: 0-No pain                 Naraya Stoneberg C [place] Belay

## 2021-03-21 NOTE — Discharge Instructions (Addendum)
EGD Discharge instructions Please read the instructions outlined below and refer to this sheet in the next few weeks. These discharge instructions provide you with general information on caring for yourself after you leave the hospital. Your doctor may also give you specific instructions. While your treatment has been planned according to the most current medical practices available, unavoidable complications occasionally occur. If you have any problems or questions after discharge, please call your doctor. ACTIVITY You may resume your regular activity but move at a slower pace for the next 24 hours.  Take frequent rest periods for the next 24 hours.  Walking will help expel (get rid of) the air and reduce the bloated feeling in your abdomen.  No driving for 24 hours (because of the anesthesia (medicine) used during the test).  You may shower.  Do not sign any important legal documents or operate any machinery for 24 hours (because of the anesthesia used during the test).  NUTRITION Drink plenty of fluids.  You may resume your normal diet.  Begin with a light meal and progress to your normal diet.  Avoid alcoholic beverages for 24 hours or as instructed by your caregiver.  MEDICATIONS You may resume your normal medications unless your caregiver tells you otherwise.  WHAT YOU CAN EXPECT TODAY You may experience abdominal discomfort such as a feeling of fullness or "gas" pains.  FOLLOW-UP Your doctor will discuss the results of your test with you.  SEEK IMMEDIATE MEDICAL ATTENTION IF ANY OF THE FOLLOWING OCCUR: Excessive nausea (feeling sick to your stomach) and/or vomiting.  Severe abdominal pain and distention (swelling).  Trouble swallowing.  Temperature over 101 F (37.8 C).  Rectal bleeding or vomiting of blood.    Your EGD showed mild amount inflammation your stomach.  I took biopsies of this to rule out an infection with a bacteria called H. pylori.  You have a small hiatal hernia  which is unchanged.  I took biopsies of your esophagus given your history of Barrett's esophagus.  I did stretch her esophagus as there was a slight narrowing.  Hopefully this helps with your swallowing.  The polyp in your small bowel was successfully removed with snare.  I did place a clip on top of the polypectomy site to reduce risk of bleeding.  If you need to have an MRI for any reason over the next 2 to 3 months you will need to have x-ray done prior as his clip is metallic.  I hope you have a great rest of your week!  Elon Alas. Abbey Chatters, D.O. Gastroenterology and Hepatology [facility]

## 2021-03-21 NOTE — Op Note (Addendum)
[facility] Patient Name: Gabriel Koch Procedure Date: 03/21/2021 10:37 AM MRN: [id number] Date of Birth: [birth year] Attending MD: Elon Alas. Abbey Chatters DO CSN: [id number] Age: 55 Admit Type: Outpatient Procedure:                Upper GI endoscopy Indications:              Dysphagia, Duodenal polyp Providers:                Elon Alas. Abbey Chatters, DO, Janeece Riggers, RN, Aram Candela Referring MD:              Medicines:                See the Anesthesia note for documentation of the                            administered medications Complications:            No immediate complications. Estimated Blood Loss:     Estimated blood loss was minimal. Procedure:                Pre-Anesthesia Assessment:                           - The anesthesia plan was to use monitored                            anesthesia care (MAC).                           After obtaining informed consent, the endoscope was                            passed under direct vision. Throughout the                            procedure, the patient's blood pressure, pulse, and                            oxygen saturations were monitored continuously. The                            GIF-H190 ([id number]) scope was introduced through the                            mouth, and advanced to the second part of duodenum.                            The upper GI endoscopy was accomplished without                            difficulty. The patient tolerated the procedure                            well. Scope In: 10:58:23 AM Scope Out: 11:12:57 AM Total Procedure Duration: 0 hours 14 minutes 34 seconds  Findings:      A small hiatal hernia was present.  There were esophageal mucosal changes secondary to established       short-segment Barrett's disease present in the distal esophagus. The       maximum longitudinal extent of these mucosal changes was 2 cm in length.       Mucosa was biopsied with a cold forceps for histology. One  specimen       bottle was sent to pathology.      Patchy mild inflammation characterized by erythema was found in the       entire examined stomach. Biopsies were taken with a cold forceps for       Helicobacter pylori testing.      One 10 mm sessile polyp with no bleeding was found in the second portion       of the duodenum. The polyp was removed with a hot snare. Resection and       retrieval were complete. To prevent bleeding after the polypectomy, one       hemostatic clip was successfully placed (MR conditional). There was no       bleeding at the end of the procedure.      No endoscopic abnormality was evident in the esophagus to explain the       patient's complaint of dysphagia. Preparations were made for empiric       dilation. A TTS dilator was passed through the scope. Dilation with an       18-19-20 mm balloon dilator was performed to 20 mm. Dilation was       performed with a mild resistance at 20 mm. Estimated blood loss was none. Impression:               - Small hiatal hernia.                           - Esophageal mucosal changes secondary to                            established short-segment Barrett's disease.                            Biopsied.                           - Gastritis. Biopsied.                           - One duodenal polyp. Resected and retrieved. Moderate Sedation:      Per Anesthesia Care Recommendation:           - Patient has a contact number available for                            emergencies. The signs and symptoms of potential                            delayed complications were discussed with the                            patient. Return to normal activities tomorrow.  Written discharge instructions were provided to the                            patient.                           - Resume previous diet.                           - Continue present medications.                           - Await pathology  results.                           - Repeat upper endoscopy at appointment to be                            scheduled for surveillance based on pathology                            results.                           - Return to GI clinic in 3 months.                           - Use a proton pump inhibitor PO BID.                           - Will order RUQ [place] given worsening abdominal pain.                            Will add elastography given alcohol use and history                            of fatty liver.                           - If patient needs to have MRI for any reason, he                            will need KUB prior to ensure clip is no longer in                            place as it is metallic. Procedure Code(s):        --- Professional ---                           [phone], Esophagogastroduodenoscopy, flexible,                            transoral; with removal of tumor(s), polyp(s), or                            other lesion(s)  by snare technique                           43239, 59, Esophagogastroduodenoscopy, flexible,                            transoral; with biopsy, single or multiple Diagnosis Code(s):        --- Professional ---                           K44.9, Diaphragmatic hernia without obstruction or                            gangrene                           K22.70, Barrett's esophagus without dysplasia                           K29.70, Gastritis, unspecified, without bleeding                           K31.7, Polyp of stomach and duodenum                           R13.10, Dysphagia, unspecified CPT copyright 2019 American Medical Association. All rights reserved. The codes documented in this report are preliminary and upon coder review may  be revised to meet current compliance requirements. Elon Alas. Abbey Chatters, DO Mildred Abbey Chatters, DO 03/21/2021 11:19:32 AM This report has been signed electronically. Number of Addenda: 0

## 2021-03-23 ENCOUNTER — Telehealth: Payer: Self-pay | Admitting: Internal Medicine

## 2021-03-23 MED ORDER — ONDANSETRON HCL 4 MG PO TABS: 4.0000 mg | ORAL_TABLET | Freq: Three times a day (TID) | ORAL | 1 refills | Status: DC | PRN

## 2021-03-23 NOTE — Telephone Encounter (Signed)
Patient called after-hours on-call line.  Underwent EGD for duodenal polyp 03/21/2021.  Polyp was removed successfully with hot snare with subsequent clip placement.  Also took biopsies of his stomach and GE junction.  He was complaining of dysphagia so empiric esophageal dilation with 20 mm balloon was performed as well.  He states yesterday he became nauseous and vomited what he describes as phlegm.  He subsequently had further episodes of vomiting blood.  Also noted melena associated with this.  Today he states he feels better, his stools have turned to yellow.  Continues to be nauseous though vomiting improved.  He did note 1 episode of vomiting this morning which was phlegm.  Unclear etiology of patient's symptoms, possibly Mallory-Weiss tear after vomiting what he describes as phlegm.  Possibly related to his upper endoscopy with polypectomy.  As patient symptoms have improved today, okay to watch for now.  If he has any further evidence of melena or vomiting blood I have instructed him to go to the ER immediately.  If he has any associated weakness/fatigue, shortness of breath, chest pain he will need to go to the ER for evaluation.  I will send in Zofran to his pharmacy at [facility]  Patient has a diagnosis in his chart of QT prolongation however EKG on 03/19/2021 showed normal QT interval.

## 2021-03-24 LAB — SURGICAL PATHOLOGY

## 2021-03-31 ENCOUNTER — Encounter (HOSPITAL_COMMUNITY): Payer: Self-pay | Admitting: Internal Medicine

## 2021-04-17 ENCOUNTER — Encounter: Payer: Self-pay | Admitting: *Deleted

## 2021-05-01 ENCOUNTER — Encounter: Payer: Self-pay | Admitting: *Deleted

## 2021-05-07 ENCOUNTER — Telehealth: Payer: Self-pay | Admitting: Internal Medicine

## 2021-05-07 DIAGNOSIS — F1010 Alcohol abuse, uncomplicated: Secondary | ICD-10-CM

## 2021-05-07 DIAGNOSIS — R109 Unspecified abdominal pain: Secondary | ICD-10-CM

## 2021-05-07 NOTE — Telephone Encounter (Signed)
Can we order right upper quadrant ultrasound with elastography?  Thank you

## 2021-05-07 NOTE — Telephone Encounter (Signed)
[phone] patient called and said that he has been unable to reach [place] and he has been waiting since his procedure for the tests he was supposed to be scheduled for.Michela Pitcher Dr. Abbey Chatters was supposed to schedule some tests.  I asked him what kind of tests and he said "MRI I guess?"  Please advise

## 2021-05-07 NOTE — Telephone Encounter (Signed)
Dr. Abbey Chatters  I couldn't find anything regarding this pt having a MRI or procedure. Please advise

## 2021-05-08 NOTE — Addendum Note (Signed)
Addended by: Cheron Every on: 05/08/2021 10:40 AM   Modules accepted: Orders

## 2021-05-08 NOTE — Telephone Encounter (Signed)
Abdominal pain, chronic alcohol use

## 2021-05-08 NOTE — Telephone Encounter (Signed)
Diagnosis?

## 2021-05-08 NOTE — Telephone Encounter (Signed)
Called pt and he is aware of appt details. He voiced understanding.

## 2021-05-08 NOTE — Telephone Encounter (Signed)
[place] w/ elastography scheduled for Tuesday 11/8 at 9:30am, arrival 9:15am, npo midnight at [place] pt, line rang numerous times, no answer and no VM

## 2021-05-13 ENCOUNTER — Ambulatory Visit (HOSPITAL_COMMUNITY): Payer: Medicaid Other

## 2021-06-10 ENCOUNTER — Other Ambulatory Visit: Payer: Self-pay

## 2021-06-10 ENCOUNTER — Ambulatory Visit (HOSPITAL_COMMUNITY)
Admission: RE | Admit: 2021-06-10 | Discharge: 2021-06-10 | Disposition: A | Discharge status: Home / Self Care | Payer: Medicaid Other | Source: Ambulatory Visit | Attending: Internal Medicine | Admitting: Internal Medicine

## 2021-06-10 DIAGNOSIS — R109 Unspecified abdominal pain: Secondary | ICD-10-CM | POA: Insufficient documentation

## 2021-06-10 DIAGNOSIS — F1010 Alcohol abuse, uncomplicated: Secondary | ICD-10-CM | POA: Insufficient documentation

## 2021-06-16 ENCOUNTER — Telehealth: Payer: Self-pay | Admitting: Internal Medicine

## 2021-06-16 DIAGNOSIS — T183XXA Foreign body in small intestine, initial encounter: Secondary | ICD-10-CM

## 2021-06-16 NOTE — Telephone Encounter (Signed)
I called and spoke with patient's mother.  His ultrasound showed a 4 cm mass in his liver.  He needs MRI to further evaluate.  However given his recent clip placement for duodenal polypectomy, he will need KUB first.  I will order this now.  Patient to go to hospital have this done.  Once I have it back then I will will then order MRI to further evaluate his liver lesion.  Thank you

## 2021-06-16 NOTE — Telephone Encounter (Signed)
The pt called wanting his [place] results. (Pt has already seen on his MyChart) but wants your interruption on it. Please advise

## 2021-06-16 NOTE — Telephone Encounter (Signed)
Patient called asking about his ultrasound results

## 2021-06-17 ENCOUNTER — Other Ambulatory Visit: Payer: Self-pay

## 2021-06-17 DIAGNOSIS — T183XXA Foreign body in small intestine, initial encounter: Secondary | ICD-10-CM

## 2021-06-17 NOTE — Telephone Encounter (Signed)
Good Morning, I phoned and and spoke with the pt and advised of the note (his aunt didn't tell him). Pt advises he cannot today or tomorrow. He has to arrange transportation which normally takes about 3 or 4 days. I advised the pt that the order is there and no appt is needed but as soon as he has this done we can move on to the next step. Pt expressed understanding.

## 2021-06-19 ENCOUNTER — Other Ambulatory Visit: Payer: Self-pay

## 2021-06-19 ENCOUNTER — Ambulatory Visit (HOSPITAL_COMMUNITY)
Admission: RE | Admit: 2021-06-19 | Discharge: 2021-06-19 | Disposition: A | Discharge status: Home / Self Care | Payer: Medicaid Other | Source: Ambulatory Visit | Attending: Internal Medicine | Admitting: Internal Medicine

## 2021-06-19 DIAGNOSIS — T183XXA Foreign body in small intestine, initial encounter: Secondary | ICD-10-CM | POA: Diagnosis present

## 2021-06-23 ENCOUNTER — Other Ambulatory Visit: Payer: Self-pay

## 2021-06-23 DIAGNOSIS — R160 Hepatomegaly, not elsewhere classified: Secondary | ICD-10-CM

## 2021-06-23 NOTE — Telephone Encounter (Signed)
Pt had his abdomen u/s done on 06/20/2021. Please advise

## 2021-06-23 NOTE — Telephone Encounter (Signed)
noted 

## 2021-07-04 ENCOUNTER — Ambulatory Visit (HOSPITAL_COMMUNITY)
Admission: RE | Admit: 2021-07-04 | Discharge: 2021-07-04 | Disposition: A | Discharge status: Home / Self Care | Payer: Medicaid Other | Source: Ambulatory Visit | Attending: Internal Medicine | Admitting: Internal Medicine

## 2021-07-04 ENCOUNTER — Other Ambulatory Visit: Payer: Self-pay

## 2021-07-04 DIAGNOSIS — R160 Hepatomegaly, not elsewhere classified: Secondary | ICD-10-CM | POA: Diagnosis present

## 2021-07-04 MED ORDER — GADOBUTROL 1 MMOL/ML IV SOLN
7.0000 mL | Freq: Once | INTRAVENOUS | Status: AC | PRN
  Administered 2021-07-04: 09:00:00 7 mL via INTRAVENOUS

## 2021-07-09 ENCOUNTER — Telehealth: Payer: Self-pay | Admitting: Internal Medicine

## 2021-07-09 NOTE — Telephone Encounter (Signed)
Phoned and advised the pt of his result note. The pt expressed understanding after talking about it for awhile.

## 2021-07-09 NOTE — Telephone Encounter (Signed)
Patient called asking about his test results

## 2021-07-09 NOTE — Telephone Encounter (Signed)
Can you please read the MRI report from 07/04/2021 so pt can understand what he looked at.

## 2021-07-09 NOTE — Telephone Encounter (Signed)
Good news. Likely a hemangioma or cyst. Fatty liver also noted. Does not appear concerning.

## 2021-07-10 ENCOUNTER — Telehealth: Payer: Self-pay | Admitting: Internal Medicine

## 2021-07-10 NOTE — Telephone Encounter (Signed)
Returned the pt's call and was advised by him that he is still having right side abdomen pain that goes around to his back. He states its at his belt line where he has had this pain for months. I asked him what level is his pain and he said today is 8 but last night it was a 10 to 15. He stated this morning about 3 he started having green, watery diarrhea. He said he is messing up his underwear and has loss count of how many times he has had a BM. I asked after using the bathroom did it help his pain he said no. He has taken over 6 tylenol and it is not touching his pain. I advised him we do not give out narcotic pain medications. Then he went on to say that he is also coughing up green phlegm. I asked him does he have a cold, he stated no. I asked did he smoke he said yes. No fever, runny nose, or fatigue, nausea or vomiting. I advised him that he may need to proceed to the nearest hospital to be evaluated and checked for the flu and covid. He declined. Also declined seeing his PCP. Stated he just wanted something phoned in for pain. I asked him what about his other symptoms he stated he was just in so much pain he wanted that fixed. Once again I advised of the nearest ER but he declined. Pt seen Dr. Abbey Chatters last and I told him, the dr is out for the week and he will not give those pain medication either. Please advise. Sent to you in lieu of Dr. Abbey Chatters being on [place].

## 2021-07-10 NOTE — Telephone Encounter (Signed)
Patient has not been seen in the office since 09/2020. He did complete EGD in 03/2021. MRI liver 07/04/21. However has not been physically seen in nearly four months.   Unable to advise regarding numerous concerns over telephone call. Sounds like he should be evaluated in the ED if he is having severe abdominal pain and/or by PCP for productive cough. I am unable to provide him with pain medication.   Would recommend having patient come back in for follow up with Dr. Abbey Chatters, routine ov. Again, for severe abdominal pain, would advise ED visit.

## 2021-07-10 NOTE — Telephone Encounter (Signed)
noted 

## 2021-07-10 NOTE — Telephone Encounter (Signed)
Pt called asking to speak with Dr Abbey Chatters or his nurse. I told him Dr Abbey Chatters wasn't in the office and his nurse was at lunch. He wants her to call him back at [phone]. He said it was regarding him being in pain all over.

## 2021-07-10 NOTE — Telephone Encounter (Signed)
Phoned and LMOVM for the pt to return call 

## 2021-07-11 NOTE — Telephone Encounter (Signed)
FYI: Pt returned call and I spoke with the pt, gave him recommendations of seeing PCP and or ED. Advised why we couldn't provide any Rx's for him. Pt was also advised he needed  to follow-up with Dr. Abbey Chatters for a routine ov. Pt declined all of these choices for now. I told him if he is hurting bad enough he will take care of this. Then he stated he will wait until Monday to see if his PCP can do something for him. I advised him that he really needs medical attention for all of his issues. Pt once again declined. Pt expressed understanding of this conversation.

## 2021-07-11 NOTE — Telephone Encounter (Signed)
Noted. Will send FYI to Dr. Abbey Chatters to bring him up-to-date regarding patient's concerns.

## 2021-07-11 NOTE — Telephone Encounter (Signed)
noted 

## 2021-07-29 ENCOUNTER — Encounter: Payer: Self-pay | Admitting: Internal Medicine

## 2021-10-23 ENCOUNTER — Ambulatory Visit (INDEPENDENT_AMBULATORY_CARE_PROVIDER_SITE_OTHER): Payer: Medicaid Other | Admitting: Internal Medicine

## 2021-10-23 VITALS — BP 120/72 | HR 92 | Temp 97.1°F | Ht 69.0 in | Wt 172.0 lb

## 2021-10-23 DIAGNOSIS — R109 Unspecified abdominal pain: Secondary | ICD-10-CM | POA: Diagnosis not present

## 2021-10-23 DIAGNOSIS — R197 Diarrhea, unspecified: Secondary | ICD-10-CM

## 2021-10-23 DIAGNOSIS — D125 Benign neoplasm of sigmoid colon: Secondary | ICD-10-CM

## 2021-10-23 DIAGNOSIS — R140 Abdominal distension (gaseous): Secondary | ICD-10-CM | POA: Diagnosis not present

## 2021-10-23 DIAGNOSIS — K219 Gastro-esophageal reflux disease without esophagitis: Secondary | ICD-10-CM

## 2021-10-23 MED ORDER — ESOMEPRAZOLE MAGNESIUM 40 MG PO CPDR: 40.0000 mg | DELAYED_RELEASE_CAPSULE | Freq: Two times a day (BID) | ORAL | 3 refills | Status: DC

## 2021-10-23 NOTE — Progress Notes (Signed)
? ? ?Referring Provider: Abran Richard, MD ?Primary Care Physician:  Abran Richard, MD ?Primary GI:  Dr. Abbey Chatters ? ?Chief Complaint  ?Patient presents with  ? Follow-up  ?  Pt still complains of abdomen pain starting on the right side  ? ? ?HPI:   ?Gabriel Koch is a 56 y.o. male who presents to the clinic today for follow-up visit.  History of chronic GERD and Barrett's esophagus. ? ?Previously underwent EGD for chronic reflux on 04/02/2020.  Findings consistent with Barrett's esophagus with esophageal biopsy showing intestinal metaplasia without dysplasia.  Stomach biopsies were negative for H. pylori.  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  Repeat EGD 03/21/2021 with polypectomy, path consistent with tubular adenoma, no high-grade dysplasia. ? ?Multiple complaints for me today.  Continues to have intermittent abdominal pain, mild to moderate in severity.  Epigastric/right upper quadrant.  States he drinks beer to help with his pain.  With associated bloating as well ? ?Still with chronic diarrhea.  States he will have to run to the bathroom at times.  Notes multiple loose stools daily. ? ?Last colonoscopy 04/02/2020 with 6-7 tubular adenomas removed, 3-4 which were greater than 10 mm.  Recommended 1 year recall. ? ?Past Medical History:  ?Diagnosis Date  ? Alcohol abuse   ? Anxiety and depression   ? Blood transfusion   ? Complication of anesthesia   ? patient hemorrrhaged about 2 weeks after tonsillecotmy  ? GERD (gastroesophageal reflux disease)   ? Hypertension   ? ? ?Past Surgical History:  ?Procedure Laterality Date  ? APPENDECTOMY    ? BIOPSY  04/02/2020  ? Procedure: BIOPSY;  Surgeon: Eloise Harman, DO;  Location: [facility];  Service: Endoscopy;;  ? COLONOSCOPY WITH PROPOFOL N/A 04/02/2020  ? Procedure: COLONOSCOPY WITH PROPOFOL;  Surgeon: Eloise Harman, DO;  Location: [facility];  Service: Endoscopy;  Laterality: N/A;  8:30am  ?  ESOPHAGOGASTRODUODENOSCOPY (EGD) WITH PROPOFOL N/A 04/02/2020  ? Procedure: ESOPHAGOGASTRODUODENOSCOPY (EGD) WITH PROPOFOL;  Surgeon: Eloise Harman, DO;  Location: [facility];  Service: Endoscopy;  Laterality: N/A;  ? ESOPHAGOGASTRODUODENOSCOPY (EGD) WITH PROPOFOL N/A 03/21/2021  ? Procedure: ESOPHAGOGASTRODUODENOSCOPY (EGD) WITH PROPOFOL;  Surgeon: Eloise Harman, DO;  Location: [facility];  Service: Endoscopy;  Laterality: N/A;  11:00am  ? POLYPECTOMY  04/02/2020  ? Procedure: POLYPECTOMY;  Surgeon: Eloise Harman, DO;  Location: [facility];  Service: Endoscopy;;  ? POLYPECTOMY  03/21/2021  ? Procedure: POLYPECTOMY;  Surgeon: Eloise Harman, DO;  Location: [facility];  Service: Endoscopy;;  duodenal  ? TONSILLECTOMY    ? ? ?Current Outpatient Medications  ?Medication Sig Dispense Refill  ? amLODipine (NORVASC) 5 MG tablet Take 5 mg by mouth daily.    ? busPIRone (BUSPAR) 10 MG tablet Take 10 mg by mouth 2 (two) times daily.    ? esomeprazole (NEXIUM) 40 MG capsule Take 1 capsule (40 mg total) by mouth in the morning and at bedtime. 60 capsule 5  ? losartan (COZAAR) 50 MG tablet Take 1 tablet (50 mg total) by mouth daily. 30 tablet 3  ? meloxicam (MOBIC) 7.5 MG tablet Take 7.5 mg by mouth daily.    ? penicillin v potassium (VEETID) 500 MG tablet Take 500 mg by mouth 4 (four) times daily.    ? traZODone (DESYREL) 100 MG tablet Take 100 mg by mouth at bedtime.    ? zolpidem (AMBIEN)  5 MG tablet Take 5 mg by mouth at bedtime as needed for sleep.    ? atorvastatin (LIPITOR) 40 MG tablet Take 40 mg by mouth daily. (Patient not taking: Reported on 03/19/2021)    ? clopidogrel (PLAVIX) 75 MG tablet Take 75 mg by mouth every morning. (Patient not taking: Reported on 10/23/2021)    ? folic acid (FOLVITE) 1 MG tablet Take 1 mg by mouth daily. (Patient not taking: Reported on 03/19/2021)    ? HYDROcodone-acetaminophen (NORCO/VICODIN) 5-325 MG tablet One tablet every six hours for pain.  Limit 7 days.  (Patient not taking: Reported on 03/19/2021) 28 tablet 0  ? naproxen (NAPROSYN) 500 MG tablet Take 1 tablet (500 mg total) by mouth 2 (two) times daily with a meal. (Patient not taking: Reported on 10/23/2021) 60 tablet 5  ? ondansetron (ZOFRAN) 4 MG tablet Take 1 tablet (4 mg total) by mouth every 8 (eight) hours as needed for nausea or vomiting. (Patient not taking: Reported on 10/23/2021) 30 tablet 1  ? potassium chloride SA (KLOR-CON) 20 MEQ tablet Take 20 mEq by mouth 3 (three) times daily. (Patient not taking: Reported on 03/19/2021)    ? ?No current facility-administered medications for this visit.  ? ? ?Allergies as of 10/23/2021 - Review Complete 10/23/2021  ?Allergen Reaction Noted  ? Bee venom  09/09/2011  ? ? ?Family History  ?Problem Relation Age of Onset  ? Colon cancer Neg Hx   ?     Pt knows little about family history  ? ? ?Social History  ? ?Socioeconomic History  ? Marital status: Legally Separated  ?  Spouse name: Not on file  ? Number of children: Not on file  ? Years of education: Not on file  ? Highest education level: Not on file  ?Occupational History  ? Not on file  ?Tobacco Use  ? Smoking status: Every Day  ?  Packs/day: 1.00  ?  Years: 35.00  ?  Pack years: 35.00  ?  Types: Cigarettes  ? Smokeless tobacco: Never  ?Substance and Sexual Activity  ? Alcohol use: Yes  ?  Alcohol/week: 6.0 standard drinks  ?  Types: 6 Cans of beer per week  ?  Comment:  previous alcoholic  ? Drug use: No  ? Sexual activity: Not Currently  ?Other Topics Concern  ? Not on file  ?Social History Narrative  ? Right handed  ? ?Social Determinants of Health  ? ?Financial Resource Strain: Not on file  ?Food Insecurity: Not on file  ?Transportation Needs: Not on file  ?Physical Activity: Not on file  ?Stress: Not on file  ?Social Connections: Not on file  ? ? ?Subjective: ?Review of Systems  ?Constitutional:  Negative for chills and fever.  ?HENT:  Negative for congestion and hearing loss.   ?Eyes:  Negative for blurred  vision and double vision.  ?Respiratory:  Negative for cough and shortness of breath.   ?Cardiovascular:  Negative for chest pain and palpitations.  ?Gastrointestinal:  Positive for abdominal pain, diarrhea and heartburn. Negative for blood in stool, constipation, melena and vomiting.  ?Genitourinary:  Negative for dysuria and urgency.  ?Musculoskeletal:  Negative for joint pain and myalgias.  ?Skin:  Negative for itching and rash.  ?Neurological:  Negative for dizziness and headaches.  ?Psychiatric/Behavioral:  Negative for depression. The patient is not nervous/anxious.   ? ? ?Objective: ?BP 120/72   Pulse 92   Temp (!) 97.1 ?F (36.2 ?C)   Ht '5\' 9"'$  (1.753 m)  Wt 172 lb (78 kg)   BMI 25.40 kg/m?  ?Physical Exam ?Constitutional:   ?   Appearance: Normal appearance.  ?HENT:  ?   Head: Normocephalic and atraumatic.  ?Eyes:  ?   Extraocular Movements: Extraocular movements intact.  ?   Conjunctiva/sclera: Conjunctivae normal.  ?Cardiovascular:  ?   Rate and Rhythm: Normal rate and regular rhythm.  ?Pulmonary:  ?   Effort: Pulmonary effort is normal.  ?   Breath sounds: Normal breath sounds.  ?Abdominal:  ?   General: Bowel sounds are normal.  ?   Palpations: Abdomen is soft.  ?Musculoskeletal:     ?   General: Normal range of motion.  ?   Cervical back: Normal range of motion and neck supple.  ?Skin: ?   General: Skin is warm.  ?Neurological:  ?   General: No focal deficit present.  ?   Mental Status: He is alert and oriented to person, place, and time.  ?Psychiatric:     ?   Mood and Affect: Mood normal.     ?   Behavior: Behavior normal.  ? ? ? ?Assessment: ?*GERD-well-controlled on Nexium twice daily ?*Barrett's esophagus ?*Right-sided abdominal pain ?*Diarrhea with urgency ?*Abdominal bloating ?*Adenomatous colon polyps ? ?Plan: ?GERD well-controlled on Nexium twice daily.  We will continue.  Repeat EGD 2025 for Barrett's surveillance. ? ?Etiology of patient's chronic diarrhea and right side abdominal pain  likely irritable bowel syndrome.  Possible pancreatic insufficiency in the setting of chronic alcohol abuse.  Does note oily looking stools.  Will give samples of pancreatic enzymes today.  Counseled to take 2 tablets wit

## 2021-10-23 NOTE — Patient Instructions (Signed)
I am going to check blood work at [facility] today.  Please go have this done when you can.  We will call you with the results. ? ?I am going to give you samples of Zenpep.  This is a pancreatic enzyme.  I want you to take 2 tablets with any meals.  1 tablet with snacks.  You should see improvement in your diarrhea within 3 to 4 days.  If it is helping then let us know and I will send in prescription. ? ?Continue on Nexium 40 mg twice daily.  I will send refills to your pharmacy in 90-day supply. ? ?We will schedule you for colonoscopy due to your history of numerous polyps in the past. ? ?It was nice seeing you again today. ? ?Dr. Abbey Chatters ?

## 2021-10-24 ENCOUNTER — Telehealth: Payer: Self-pay

## 2021-10-24 ENCOUNTER — Other Ambulatory Visit: Payer: Self-pay

## 2021-10-24 MED ORDER — CLENPIQ 10-3.5-12 MG-GM -GM/175ML PO SOLN: 1.0000 | ORAL | 0 refills | Status: DC

## 2021-10-24 NOTE — Telephone Encounter (Signed)
Called pt to schedule TCS ASA 3 w/Dr. Abbey Chatters. Procedure was scheduled for 11/17/21 at 8:15am. Rx for prep sent to pharmacy.  ? ?Pt called back few minutes later, he doesn't want to do TCS 11/17/21 because he will be having teeth pulled 11/14/21. He wants morning procedure. Advised him we will call back to schedule procedure when Dr. Ave Filter June schedule is available. Rx for prep cancelled. ?

## 2021-10-29 ENCOUNTER — Other Ambulatory Visit (HOSPITAL_COMMUNITY)
Admission: RE | Admit: 2021-10-29 | Discharge: 2021-10-29 | Disposition: A | Discharge status: Home / Self Care | Payer: Medicaid Other | Source: Ambulatory Visit | Attending: Internal Medicine | Admitting: Internal Medicine

## 2021-10-29 DIAGNOSIS — R109 Unspecified abdominal pain: Secondary | ICD-10-CM | POA: Diagnosis present

## 2021-10-29 DIAGNOSIS — D125 Benign neoplasm of sigmoid colon: Secondary | ICD-10-CM | POA: Diagnosis present

## 2021-10-29 DIAGNOSIS — R140 Abdominal distension (gaseous): Secondary | ICD-10-CM | POA: Insufficient documentation

## 2021-10-29 DIAGNOSIS — R197 Diarrhea, unspecified: Secondary | ICD-10-CM | POA: Diagnosis present

## 2021-10-29 LAB — CBC
HCT: 35.6 % — ABNORMAL LOW (ref 39.0–52.0)
Hemoglobin: 11.8 g/dL — ABNORMAL LOW (ref 13.0–17.0)
MCH: 31.3 pg (ref 26.0–34.0)
MCHC: 33.1 g/dL (ref 30.0–36.0)
MCV: 94.4 fL (ref 80.0–100.0)
Platelets: 230 10*3/uL (ref 150–400)
RBC: 3.77 MIL/uL — ABNORMAL LOW (ref 4.22–5.81)
RDW: 14.2 % (ref 11.5–15.5)
WBC: 4.1 10*3/uL (ref 4.0–10.5)
nRBC: 0 % (ref 0.0–0.2)

## 2021-10-29 LAB — COMPREHENSIVE METABOLIC PANEL
ALT: 31 U/L (ref 0–44)
AST: 42 U/L — ABNORMAL HIGH (ref 15–41)
Albumin: 3.6 g/dL (ref 3.5–5.0)
Alkaline Phosphatase: 96 U/L (ref 38–126)
Anion gap: 10 (ref 5–15)
BUN: 7 mg/dL (ref 6–20)
CO2: 23 mmol/L (ref 22–32)
Calcium: 8.9 mg/dL (ref 8.9–10.3)
Chloride: 97 mmol/L — ABNORMAL LOW (ref 98–111)
Creatinine, Ser: 1.06 mg/dL (ref 0.61–1.24)
GFR, Estimated: >60 mL/min (ref >60)
Glucose, Bld: 107 mg/dL — ABNORMAL HIGH (ref 70–99)
Potassium: 3.8 mmol/L (ref 3.5–5.1)
Sodium: 130 mmol/L — ABNORMAL LOW (ref 135–145)
Total Bilirubin: 0.4 mg/dL (ref 0.3–1.2)
Total Protein: 8 g/dL (ref 6.5–8.1)

## 2021-10-29 LAB — TSH: TSH: 2.185 u[IU]/mL (ref 0.350–4.500)

## 2021-10-31 LAB — GLIA (IGA/G) + TTG IGA
Antigliadin Abs, IgA: 6 units (ref 0–19)
Gliadin IgG: 9 units (ref 0–19)
Tissue Transglutaminase Ab, IgA: <2 U/mL (ref 0–3)

## 2021-11-11 ENCOUNTER — Telehealth: Payer: Self-pay | Admitting: *Deleted

## 2021-11-11 NOTE — Telephone Encounter (Signed)
Patient called and is wanting his lab results he had done recently. Please advise  ?

## 2021-11-11 NOTE — Telephone Encounter (Signed)
Called pt. He has been scheduled for 6/2 at 11:30am. Aware will mail prep instructions with pre-op appt. He already has prep at home.  ?

## 2021-11-11 NOTE — Telephone Encounter (Signed)
Dr. Abbey Chatters, ?I can call the pt once you look at his lab results. ?

## 2021-11-12 ENCOUNTER — Encounter: Payer: Self-pay | Admitting: *Deleted

## 2021-11-14 ENCOUNTER — Other Ambulatory Visit: Payer: Self-pay | Admitting: Internal Medicine

## 2021-12-02 ENCOUNTER — Telehealth: Payer: Self-pay | Admitting: Internal Medicine

## 2021-12-02 NOTE — Telephone Encounter (Signed)
I didn't have any VM's from patient.   Called pt. He reports he has a case of laryngitis. Reports going on 4 weeks he was diagnosed. He has pre-op tomorrow.  He then asked to r/s. Patient moved to 7/10 at 8:30am. Aware will mail new instructions/pre-op appt. Called endo and LMOVM making aware of appt change.

## 2021-12-02 NOTE — Patient Instructions (Signed)
Gabriel Koch  12/02/2021     '@PREFPERIOPPHARMACY'$ @   Your procedure is scheduled on  12/05/2021.   Report to [name] at  0930 AM.    Call this number if you have problems the morning of surgery:  [phone]   Remember:  Follow the diet and prep instructions given to you by the office.    Take these medicines the morning of surgery with A SIP OF WATER        amlodipine, buspar, nexium, mobic, zofran (if needed).     Do not wear jewelry, make-up or nail polish.  Do not wear lotions, powders, or perfumes, or deodorant.  Do not shave 48 hours prior to surgery.  Men may shave face and neck.  Do not bring valuables to the hospital.  [facility] is not responsible for any belongings or valuables.  Contacts, dentures or bridgework may not be worn into surgery.  Leave your suitcase in the car.  After surgery it may be brought to your room.  For patients admitted to the hospital, discharge time will be determined by your treatment team.  Patients discharged the day of surgery will not be allowed to drive home and must have someone with them for 24 hours.    Special instructions:   DO NOT smoke tobacco or vape for 24 hours before your procedure.  Please read over the following fact sheets that you were given. Anesthesia Post-op Instructions and Care and Recovery After Surgery      Colonoscopy, Adult, Care After The following information offers guidance on how to care for yourself after your procedure. Your health care provider may also give you more specific instructions. If you have problems or questions, contact your health care provider. What can I expect after the procedure? After the procedure, it is common to have: A small amount of blood in your stool for 24 hours after the procedure. Some gas. Mild cramping or bloating of your abdomen. Follow these instructions at home: Eating and drinking  Drink enough fluid to keep your urine pale yellow. Follow  instructions from your health care provider about eating or drinking restrictions. Resume your normal diet as told by your health care provider. Avoid heavy or fried foods that are hard to digest. Activity Rest as told by your health care provider. Avoid sitting for a long time without moving. Get up to take short walks every 1-2 hours. This is important to improve blood flow and breathing. Ask for help if you feel weak or unsteady. Return to your normal activities as told by your health care provider. Ask your health care provider what activities are safe for you. Managing cramping and bloating  Try walking around when you have cramps or feel bloated. If directed, apply heat to your abdomen as told by your health care provider. Use the heat source that your health care provider recommends, such as a moist heat pack or a heating pad. Place a towel between your skin and the heat source. Leave the heat on for 20-30 minutes. Remove the heat if your skin turns bright red. This is especially important if you are unable to feel pain, heat, or cold. You have a greater risk of getting burned. General instructions If you were given a sedative during the procedure, it can affect you for several hours. Do not drive or operate machinery until your health care provider says that it is safe. For the first 24 hours after the  procedure: Do not sign important documents. Do not drink alcohol. Do your regular daily activities at a slower pace than normal. Eat soft foods that are easy to digest. Take over-the-counter and prescription medicines only as told by your health care provider. Keep all follow-up visits. This is important. Contact a health care provider if: You have blood in your stool 2-3 days after the procedure. Get help right away if: You have more than a small spotting of blood in your stool. You have large blood clots in your stool. You have swelling of your abdomen. You have nausea or  vomiting. You have a fever. You have increasing pain in your abdomen that is not relieved with medicine. These symptoms may be an emergency. Get help right away. Call 911. Do not wait to see if the symptoms will go away. Do not drive yourself to the hospital. Summary After the procedure, it is common to have a small amount of blood in your stool. You may also have mild cramping and bloating of your abdomen. If you were given a sedative during the procedure, it can affect you for several hours. Do not drive or operate machinery until your health care provider says that it is safe. Get help right away if you have a lot of blood in your stool, nausea or vomiting, a fever, or increased pain in your abdomen. This information is not intended to replace advice given to you by your health care provider. Make sure you discuss any questions you have with your health care provider. Document Revised: 02/12/2021 Document Reviewed: 02/12/2021 Elsevier Patient Education  [place] After This sheet gives you information about how to care for yourself after your procedure. Your health care provider may also give you more specific instructions. If you have problems or questions, contact your health care provider. What can I expect after the procedure? After the procedure, it is common to have: Tiredness. Forgetfulness about what happened after the procedure. Impaired judgment for important decisions. Nausea or vomiting. Some difficulty with balance. Follow these instructions at home: For the time period you were told by your health care provider:     Rest as needed. Do not participate in activities where you could fall or become injured. Do not drive or use machinery. Do not drink alcohol. Do not take sleeping pills or medicines that cause drowsiness. Do not make important decisions or sign legal documents. Do not take care of children on your own. Eating and  drinking Follow the diet that is recommended by your health care provider. Drink enough fluid to keep your urine pale yellow. If you vomit: Drink water, juice, or soup when you can drink without vomiting. Make sure you have little or no nausea before eating solid foods. General instructions Have a responsible adult stay with you for the time you are told. It is important to have someone help care for you until you are awake and alert. Take over-the-counter and prescription medicines only as told by your health care provider. If you have sleep apnea, surgery and certain medicines can increase your risk for breathing problems. Follow instructions from your health care provider about wearing your sleep device: Anytime you are sleeping, including during daytime naps. While taking prescription pain medicines, sleeping medicines, or medicines that make you drowsy. Avoid smoking. Keep all follow-up visits as told by your health care provider. This is important. Contact a health care provider if: You keep feeling nauseous or you keep vomiting.  You feel light-headed. You are still sleepy or having trouble with balance after 24 hours. You develop a rash. You have a fever. You have redness or swelling around the IV site. Get help right away if: You have trouble breathing. You have new-onset confusion at home. Summary For several hours after your procedure, you may feel tired. You may also be forgetful and have poor judgment. Have a responsible adult stay with you for the time you are told. It is important to have someone help care for you until you are awake and alert. Rest as told. Do not drive or operate machinery. Do not drink alcohol or take sleeping pills. Get help right away if you have trouble breathing, or if you suddenly become confused. This information is not intended to replace advice given to you by your health care provider. Make sure you discuss any questions you have with your  health care provider. Document Revised: 05/27/2021 Document Reviewed: 05/25/2019 Elsevier Patient Education  [place].

## 2021-12-02 NOTE — Telephone Encounter (Signed)
Pt needs to speak with the nurse about his procedure and having laryngitis. He said he has left several messages and needing to know something before Friday. Please call [phone]

## 2021-12-02 NOTE — Telephone Encounter (Signed)
Noted   FYI: Dr. Abbey Chatters Pt has rescheduled his procedure that was scheduled for Friday.

## 2021-12-03 ENCOUNTER — Encounter: Payer: Self-pay | Admitting: *Deleted

## 2021-12-03 ENCOUNTER — Encounter (HOSPITAL_COMMUNITY)
Admission: RE | Admit: 2021-12-03 | Discharge: 2021-12-03 | Disposition: A | Discharge status: Home / Self Care | Payer: Medicaid Other | Source: Ambulatory Visit | Attending: Internal Medicine | Admitting: Internal Medicine

## 2021-12-14 ENCOUNTER — Encounter (HOSPITAL_COMMUNITY): Payer: Self-pay | Admitting: *Deleted

## 2021-12-14 ENCOUNTER — Other Ambulatory Visit: Payer: Self-pay

## 2021-12-14 ENCOUNTER — Emergency Department (HOSPITAL_COMMUNITY): Payer: Medicaid Other

## 2021-12-14 ENCOUNTER — Inpatient Hospital Stay (HOSPITAL_COMMUNITY)
Admission: EM | Admit: 2021-12-14 | Discharge: 2021-12-17 | DRG: 641 | Disposition: A | Discharge status: Home / Self Care | Payer: Medicaid Other | Attending: Family Medicine | Admitting: Family Medicine

## 2021-12-14 DIAGNOSIS — F32A Depression, unspecified: Secondary | ICD-10-CM | POA: Diagnosis present

## 2021-12-14 DIAGNOSIS — Z791 Long term (current) use of non-steroidal anti-inflammatories (NSAID): Secondary | ICD-10-CM

## 2021-12-14 DIAGNOSIS — Z9049 Acquired absence of other specified parts of digestive tract: Secondary | ICD-10-CM

## 2021-12-14 DIAGNOSIS — E871 Hypo-osmolality and hyponatremia: Secondary | ICD-10-CM | POA: Diagnosis not present

## 2021-12-14 DIAGNOSIS — L819 Disorder of pigmentation, unspecified: Secondary | ICD-10-CM | POA: Diagnosis not present

## 2021-12-14 DIAGNOSIS — K219 Gastro-esophageal reflux disease without esophagitis: Secondary | ICD-10-CM | POA: Diagnosis present

## 2021-12-14 DIAGNOSIS — F1010 Alcohol abuse, uncomplicated: Secondary | ICD-10-CM | POA: Diagnosis not present

## 2021-12-14 DIAGNOSIS — W19XXXA Unspecified fall, initial encounter: Secondary | ICD-10-CM | POA: Diagnosis present

## 2021-12-14 DIAGNOSIS — Z7982 Long term (current) use of aspirin: Secondary | ICD-10-CM

## 2021-12-14 DIAGNOSIS — K8689 Other specified diseases of pancreas: Secondary | ICD-10-CM | POA: Diagnosis present

## 2021-12-14 DIAGNOSIS — S301XXA Contusion of abdominal wall, initial encounter: Secondary | ICD-10-CM | POA: Diagnosis present

## 2021-12-14 DIAGNOSIS — Z79899 Other long term (current) drug therapy: Secondary | ICD-10-CM

## 2021-12-14 DIAGNOSIS — I10 Essential (primary) hypertension: Secondary | ICD-10-CM | POA: Diagnosis present

## 2021-12-14 DIAGNOSIS — Z91030 Bee allergy status: Secondary | ICD-10-CM

## 2021-12-14 DIAGNOSIS — F17210 Nicotine dependence, cigarettes, uncomplicated: Secondary | ICD-10-CM | POA: Diagnosis present

## 2021-12-14 DIAGNOSIS — I2510 Atherosclerotic heart disease of native coronary artery without angina pectoris: Secondary | ICD-10-CM | POA: Diagnosis not present

## 2021-12-14 DIAGNOSIS — R1111 Vomiting without nausea: Secondary | ICD-10-CM

## 2021-12-14 DIAGNOSIS — R198 Other specified symptoms and signs involving the digestive system and abdomen: Secondary | ICD-10-CM | POA: Diagnosis present

## 2021-12-14 DIAGNOSIS — I2584 Coronary atherosclerosis due to calcified coronary lesion: Secondary | ICD-10-CM

## 2021-12-14 DIAGNOSIS — F419 Anxiety disorder, unspecified: Secondary | ICD-10-CM | POA: Diagnosis present

## 2021-12-14 DIAGNOSIS — R1110 Vomiting, unspecified: Secondary | ICD-10-CM | POA: Diagnosis present

## 2021-12-14 DIAGNOSIS — K582 Mixed irritable bowel syndrome: Secondary | ICD-10-CM | POA: Diagnosis present

## 2021-12-14 LAB — COMPREHENSIVE METABOLIC PANEL
ALT: 23 U/L (ref 0–44)
AST: 39 U/L (ref 15–41)
Albumin: 3.6 g/dL (ref 3.5–5.0)
Alkaline Phosphatase: 71 U/L (ref 38–126)
Anion gap: 11 (ref 5–15)
BUN: 9 mg/dL (ref 6–20)
CO2: 19 mmol/L — ABNORMAL LOW (ref 22–32)
Calcium: 8.9 mg/dL (ref 8.9–10.3)
Chloride: 93 mmol/L — ABNORMAL LOW (ref 98–111)
Creatinine, Ser: 0.96 mg/dL (ref 0.61–1.24)
GFR, Estimated: >60 mL/min (ref >60)
Glucose, Bld: 81 mg/dL (ref 70–99)
Potassium: 3.9 mmol/L (ref 3.5–5.1)
Sodium: 123 mmol/L — ABNORMAL LOW (ref 135–145)
Total Bilirubin: 1 mg/dL (ref 0.3–1.2)
Total Protein: 7.9 g/dL (ref 6.5–8.1)

## 2021-12-14 LAB — CBC WITH DIFFERENTIAL/PLATELET
Abs Immature Granulocytes: 0.05 10*3/uL (ref 0.00–0.07)
Basophils Absolute: 0 10*3/uL (ref 0.0–0.1)
Basophils Relative: 1 %
Eosinophils Absolute: 0.1 10*3/uL (ref 0.0–0.5)
Eosinophils Relative: 2 %
HCT: 30.9 % — ABNORMAL LOW (ref 39.0–52.0)
Hemoglobin: 10.5 g/dL — ABNORMAL LOW (ref 13.0–17.0)
Immature Granulocytes: 1 %
Lymphocytes Relative: 15 %
Lymphs Abs: 0.8 10*3/uL (ref 0.7–4.0)
MCH: 32.5 pg (ref 26.0–34.0)
MCHC: 34 g/dL (ref 30.0–36.0)
MCV: 95.7 fL (ref 80.0–100.0)
Monocytes Absolute: 0.8 10*3/uL (ref 0.1–1.0)
Monocytes Relative: 15 %
Neutro Abs: 3.5 10*3/uL (ref 1.7–7.7)
Neutrophils Relative %: 66 %
Platelets: 214 10*3/uL (ref 150–400)
RBC: 3.23 MIL/uL — ABNORMAL LOW (ref 4.22–5.81)
RDW: 17.3 % — ABNORMAL HIGH (ref 11.5–15.5)
WBC: 5.2 10*3/uL (ref 4.0–10.5)
nRBC: 0 % (ref 0.0–0.2)

## 2021-12-14 LAB — SODIUM, URINE, RANDOM: Sodium, Ur: 37 mmol/L

## 2021-12-14 LAB — MAGNESIUM: Magnesium: 1.8 mg/dL (ref 1.7–2.4)

## 2021-12-14 LAB — PHOSPHORUS: Phosphorus: 3.3 mg/dL (ref 2.5–4.6)

## 2021-12-14 LAB — PROTIME-INR
INR: 1 (ref 0.8–1.2)
Prothrombin Time: 13.2 seconds (ref 11.4–15.2)

## 2021-12-14 LAB — LIPASE, BLOOD: Lipase: 74 U/L — ABNORMAL HIGH (ref 11–51)

## 2021-12-14 MED ORDER — ONDANSETRON HCL 4 MG/2ML IJ SOLN
4.0000 mg | Freq: Once | INTRAMUSCULAR | Status: AC
  Administered 2021-12-14: 4 mg via INTRAVENOUS
  Filled 2021-12-14: qty 2

## 2021-12-14 MED ORDER — ENSURE ENLIVE PO LIQD: 237.0000 mL | Freq: Two times a day (BID) | ORAL | Status: DC

## 2021-12-14 MED ORDER — METOCLOPRAMIDE HCL 5 MG/ML IJ SOLN
5.0000 mg | Freq: Once | INTRAMUSCULAR | Status: AC
  Administered 2021-12-14: 5 mg via INTRAVENOUS
  Filled 2021-12-14: qty 2

## 2021-12-14 MED ORDER — SODIUM CHLORIDE 0.9 % IV SOLN: INTRAVENOUS | Status: DC

## 2021-12-14 MED ORDER — ACETAMINOPHEN 325 MG PO TABS
650.0000 mg | ORAL_TABLET | Freq: Four times a day (QID) | ORAL | Status: DC | PRN
  Filled 2021-12-14: qty 2

## 2021-12-14 MED ORDER — NICOTINE 14 MG/24HR TD PT24
14.0000 mg | MEDICATED_PATCH | Freq: Every day | TRANSDERMAL | Status: DC
Start: 2021-12-14 — End: 2021-12-17
  Administered 2021-12-14 – 2021-12-17 (×4): 14 mg via TRANSDERMAL
  Filled 2021-12-14 (×4): qty 1

## 2021-12-14 MED ORDER — LORAZEPAM 2 MG/ML IJ SOLN: 1.0000 mg | INTRAMUSCULAR | Status: DC | PRN

## 2021-12-14 MED ORDER — THIAMINE HCL 100 MG PO TABS
100.0000 mg | ORAL_TABLET | Freq: Every day | ORAL | Status: DC
  Administered 2021-12-14 – 2021-12-17 (×4): 100 mg via ORAL
  Filled 2021-12-14 (×4): qty 1

## 2021-12-14 MED ORDER — ADULT MULTIVITAMIN W/MINERALS CH
1.0000 | ORAL_TABLET | Freq: Every day | ORAL | Status: DC
Start: 2021-12-14 — End: 2021-12-17
  Administered 2021-12-14 – 2021-12-17 (×4): 1 via ORAL
  Filled 2021-12-14 (×4): qty 1

## 2021-12-14 MED ORDER — FOLIC ACID 1 MG PO TABS
1.0000 mg | ORAL_TABLET | Freq: Every day | ORAL | Status: DC
  Administered 2021-12-14 – 2021-12-17 (×4): 1 mg via ORAL
  Filled 2021-12-14 (×4): qty 1

## 2021-12-14 MED ORDER — THIAMINE HCL 100 MG/ML IJ SOLN
100.0000 mg | Freq: Every day | INTRAMUSCULAR | Status: DC
  Filled 2021-12-14: qty 2

## 2021-12-14 MED ORDER — TRAMADOL HCL 50 MG PO TABS
50.0000 mg | ORAL_TABLET | Freq: Once | ORAL | Status: AC
  Administered 2021-12-14: 50 mg via ORAL
  Filled 2021-12-14: qty 1

## 2021-12-14 MED ORDER — MORPHINE SULFATE (PF) 4 MG/ML IV SOLN
4.0000 mg | Freq: Once | INTRAVENOUS | Status: AC
  Administered 2021-12-14: 4 mg via INTRAVENOUS
  Filled 2021-12-14: qty 1

## 2021-12-14 MED ORDER — IOHEXOL 300 MG/ML  SOLN
100.0000 mL | Freq: Once | INTRAMUSCULAR | Status: AC | PRN
Start: 2021-12-14 — End: 2021-12-14
  Administered 2021-12-14: 100 mL via INTRAVENOUS

## 2021-12-14 MED ORDER — LORAZEPAM 1 MG PO TABS
1.0000 mg | ORAL_TABLET | ORAL | Status: DC | PRN
  Administered 2021-12-15 (×2): 2 mg via ORAL
  Administered 2021-12-16: 1 mg via ORAL
  Administered 2021-12-16: 2 mg via ORAL
  Administered 2021-12-16: 1 mg via ORAL
  Filled 2021-12-14 (×2): qty 2
  Filled 2021-12-14 (×2): qty 1
  Filled 2021-12-14: qty 2

## 2021-12-14 MED ORDER — ACETAMINOPHEN 650 MG RE SUPP: 650.0000 mg | Freq: Four times a day (QID) | RECTAL | Status: DC | PRN

## 2021-12-14 MED ORDER — FAMOTIDINE IN NACL 20-0.9 MG/50ML-% IV SOLN
20.0000 mg | Freq: Once | INTRAVENOUS | Status: AC
  Administered 2021-12-14: 20 mg via INTRAVENOUS
  Filled 2021-12-14: qty 50

## 2021-12-14 MED ORDER — SODIUM CHLORIDE 0.9 % IV BOLUS
1000.0000 mL | Freq: Once | INTRAVENOUS | Status: AC
  Administered 2021-12-14: 1000 mL via INTRAVENOUS

## 2021-12-14 NOTE — Assessment & Plan Note (Signed)
Stable. -Resume Norvasc - resume home losartan

## 2021-12-14 NOTE — Assessment & Plan Note (Signed)
[facility] 123. Baseline over the past year- 130 - 138. Likely 2/2 to both alcohol abuse and GI losses- Vomiting and diarrhea. - sodium improving with hydration

## 2021-12-14 NOTE — H&P (Addendum)
History and Physical    Gabriel Koch XFG:[id number] DOB: 1966/02/11 DOA: 12/14/2021  PCP: Abran Richard, MD   Patient coming from: Home  I have personally briefly reviewed patient's old medical records in [place]  Chief Complaint: Vomiting, epigastric pain, left flank bruising  HPI: Gabriel Koch is a 56 y.o. male with medical history significant for hypertension, stroke, anxiety and depression, alcohol abuse. Patient presented to the ED with complaints of vomiting of 5 days duration, with cough for about a week duration.  Cough is productive of white phlegm, reports was diagnosed with laryngitis.  He also has several other complaints which include pain in his upper abdomen that feels like his usual reflux pain.  He reports multiple episodes of vomiting, no blood.  He also reports diarrhea, 5-6 times yesterday.  No blood. He noticed bruising to the left side of his abdomen for 5 days, he reports today the bruising got worse.  He denies any form of trauma to the area, no falls, he denies being physical with any questions, denies any form of assault.  Reports only holding his left side when coughing. Denies losing consciousness.  He reports history of blackouts, reports these are very seldom, last episode was about a month ago.  He reports that his black outs started after he was diagnosed with stroke.  He is very sure he did not blackout and hence sustained injury to his left side.  No history of seizures.  He does admit to daily alcohol intake, and has the shakes when he does not drink alcohol. He also reports mild headaches over the past week. Received some antibiotics about a month ago for tooth extraction.    ED Course: Stable vitals. Na - 123.  CT Chest abd and pelvis- Mild bruising along the lateral aspect of the left abdominal  wall.  And marked severity coronary artery calcification. 1 L bolus given  Review of Systems: As per HPI all other systems reviewed and  negative.  Past Medical History:  Diagnosis Date   Alcohol abuse    Anxiety and depression    Blood transfusion    Complication of anesthesia    patient hemorrrhaged about 2 weeks after tonsillecotmy   GERD (gastroesophageal reflux disease)    Hypertension     Past Surgical History:  Procedure Laterality Date   APPENDECTOMY     BIOPSY  04/02/2020   Procedure: BIOPSY;  Surgeon: Eloise Harman, DO;  [facility]: [facility];  Service: Endoscopy;;   COLONOSCOPY WITH PROPOFOL N/A 04/02/2020   Procedure: COLONOSCOPY WITH PROPOFOL;  Surgeon: Eloise Harman, DO;  [facility]: [facility];  Service: Endoscopy;  Laterality: N/A;  8:30am   ESOPHAGOGASTRODUODENOSCOPY (EGD) WITH PROPOFOL N/A 04/02/2020   Procedure: ESOPHAGOGASTRODUODENOSCOPY (EGD) WITH PROPOFOL;  Surgeon: Eloise Harman, DO;  [facility]: [facility];  Service: Endoscopy;  Laterality: N/A;   ESOPHAGOGASTRODUODENOSCOPY (EGD) WITH PROPOFOL N/A 03/21/2021   Procedure: ESOPHAGOGASTRODUODENOSCOPY (EGD) WITH PROPOFOL;  Surgeon: Eloise Harman, DO;  [facility]: [facility];  Service: Endoscopy;  Laterality: N/A;  11:00am   POLYPECTOMY  04/02/2020   Procedure: POLYPECTOMY;  Surgeon: Eloise Harman, DO;  [facility];  Service: Endoscopy;;   POLYPECTOMY  03/21/2021   Procedure: POLYPECTOMY;  Surgeon: Eloise Harman, DO;  [facility]: [facility];  Service: Endoscopy;;  duodenal   TONSILLECTOMY       reports that he has been smoking cigarettes. He has a 35.00 pack-year smoking history. He  has never used smokeless tobacco. He reports current alcohol use of about 6.0 standard drinks of alcohol per week. He reports that he does not use drugs.  Allergies  Allergen Reactions   Bee Venom     Family History  Problem Relation Age of Onset   Colon cancer Neg Hx        Pt knows little about family history   Prior to Admission medications   Medication Sig Start Date End Date Taking? Authorizing Provider  amLODipine  (NORVASC) 5 MG tablet Take 5 mg by mouth daily. 01/27/21   [provider]  atorvastatin (LIPITOR) 40 MG tablet Take 40 mg by mouth daily. Patient not taking: Reported on 03/19/2021 01/27/21   [provider]  busPIRone (BUSPAR) 10 MG tablet Take 10 mg by mouth 2 (two) times daily. 01/27/21   [provider]  esomeprazole (NEXIUM) 40 MG capsule Take 1 capsule (40 mg total) by mouth in the morning and at bedtime. 10/23/21 10/23/22  Eloise Harman, DO  folic acid (FOLVITE) 1 MG tablet Take 1 mg by mouth daily. Patient not taking: Reported on 03/19/2021 01/27/21   [provider]  HYDROcodone-acetaminophen (NORCO/VICODIN) 5-325 MG tablet One tablet every six hours for pain.  Limit 7 days. Patient not taking: Reported on 03/19/2021 11/12/20   Sanjuana Kava, MD  losartan (COZAAR) 50 MG tablet Take 1 tablet (50 mg total) by mouth daily. 04/30/20   Roxan Hockey, MD  meloxicam (MOBIC) 7.5 MG tablet Take 7.5 mg by mouth daily.    [provider]  naproxen (NAPROSYN) 500 MG tablet Take 1 tablet (500 mg total) by mouth 2 (two) times daily with a meal. Patient not taking: Reported on 10/23/2021 12/10/20   Sanjuana Kava, MD  ondansetron (ZOFRAN) 4 MG tablet TAKE 1 TABLET EVERY 8 HOURS AS NEEDEDFOR NAUSEA & VOMITING 11/19/21   Eloise Harman, DO  penicillin v potassium (VEETID) 500 MG tablet Take 500 mg by mouth 4 (four) times daily.    [provider]  potassium chloride SA (KLOR-CON) 20 MEQ tablet Take 20 mEq by mouth 3 (three) times daily. Patient not taking: Reported on 03/19/2021 01/27/21   [provider]  traZODone (DESYREL) 100 MG tablet Take 100 mg by mouth at bedtime. 01/27/21   [provider]  zolpidem (AMBIEN) 5 MG tablet Take 5 mg by mouth at bedtime as needed for sleep.    [provider]    Physical Exam: Vitals:   12/14/21 1530 12/14/21 1535 12/14/21 1540 12/14/21 1550  BP: (!) 115/91     Pulse: 91 94 95 94   Resp:      Temp:      TempSrc:      SpO2: 100% 100% 99% 100%  Weight:      Height:        Constitutional: NAD, calm, comfortable Vitals:   12/14/21 1530 12/14/21 1535 12/14/21 1540 12/14/21 1550  BP: (!) 115/91     Pulse: 91 94 95 94  Resp:      Temp:      TempSrc:      SpO2: 100% 100% 99% 100%  Weight:      Height:       Eyes: PERRL, lids and conjunctivae normal ENMT: Mucous membranes are moist.   Neck: normal, supple, no masses, no thyromegaly Respiratory: clear to auscultation bilaterally, no wheezing, no crackles.  Cardiovascular: Regular rate and rhythm, no murmurs / rubs / gallops. No extremity edema.  Abdomen:  no tenderness, no masses palpated. No hepatosplenomegaly. Bowel sounds positive.  Musculoskeletal: no clubbing / cyanosis. No joint deformity upper and lower extremities.  Skin: Large ecchymotic area to left lateral wall of abdomen and flank, with mild tenderness Neurologic: No apparent cranial nerve abnormality moving extremities spontaneously Psychiatric: Normal judgment and insight. Alert and oriented x 3. Normal mood.   Labs on Admission: I have personally reviewed following labs and imaging studies  CBC: Recent Labs  Lab 12/14/21 1245  WBC 5.2  NEUTROABS 3.5  HGB 10.5*  HCT 30.9*  MCV 95.7  PLT 315   Basic Metabolic Panel: Recent Labs  Lab 12/14/21 1245  NA 123*  K 3.9  CL 93*  CO2 19*  GLUCOSE 81  BUN 9  CREATININE 0.96  CALCIUM 8.9   GFR: Estimated Creatinine Clearance: 86.9 mL/min (by C-G formula based on SCr of 0.96 mg/dL). Liver Function Tests: Recent Labs  Lab 12/14/21 1245  AST 39  ALT 23  ALKPHOS 71  BILITOT 1.0  PROT 7.9  ALBUMIN 3.6   Recent Labs  Lab 12/14/21 1451  LIPASE 74*   No results for input(s): "AMMONIA" in the last 168 hours. Coagulation Profile: Recent Labs  Lab 12/14/21 1334  INR 1.0    Radiological Exams on Admission: CT CHEST ABDOMEN PELVIS W CONTRAST  Result Date: 12/14/2021 CLINICAL  DATA:  Cough and vomiting with left-sided abdominal pain and a bruise to the left flank. EXAM: CT CHEST, ABDOMEN, AND PELVIS WITH CONTRAST TECHNIQUE: Multidetector CT imaging of the chest, abdomen and pelvis was performed following the standard protocol during bolus administration of intravenous contrast. RADIATION DOSE REDUCTION: This exam was performed according to the departmental dose-optimization program which includes automated exposure control, adjustment of the mA and/or kV according to patient size and/or use of iterative reconstruction technique. CONTRAST:  18m OMNIPAQUE IOHEXOL 300 MG/ML  SOLN COMPARISON:  None Available. FINDINGS: CT CHEST FINDINGS Cardiovascular: There is mild calcification of the aortic arch, without evidence of aortic aneurysm or dissection. Normal heart size with marked severity coronary artery calcification. No pericardial effusion. Mediastinum/Nodes: No enlarged mediastinal or hilar lymph nodes. A 1.4 cm left axillary lymph node is seen. Thyroid gland, trachea, and esophagus demonstrate no significant findings. Lungs/Pleura: Mild, hazy right upper lobe atelectasis is seen, with additional areas of atelectasis noted within the lingular region and along the periphery of the left lung base. A 1.3 cm area of perivascular fat attenuation (-10.69 Hounsfield units) is seen along the infrahilar region on the right (axial CT image 44, CT series 2). A trace amount of pleural fluid is seen on the left. No pneumothorax is identified. Mild, nonspecific asymmetric thickening of the right hemidiaphragm is noted (axial CT images 55 through 71, CT series 2). Musculoskeletal: No chest wall mass or suspicious bone lesions identified. CT ABDOMEN PELVIS FINDINGS Hepatobiliary: No focal liver abnormality is seen. No gallstones, gallbladder wall thickening, or biliary dilatation. Pancreas: Unremarkable. No pancreatic ductal dilatation or surrounding inflammatory changes. Spleen: Normal in size without  focal abnormality. Adrenals/Urinary Tract: Adrenal glands are unremarkable. Kidneys are normal, without renal calculi, focal lesion, or hydronephrosis. Bladder is unremarkable. Stomach/Bowel: There is a small hiatal hernia. The appendix is surgically absent. No evidence of bowel wall thickening, distention, or inflammatory changes. Noninflamed diverticula are seen within the proximal sigmoid colon. Vascular/Lymphatic: Aortic atherosclerosis. No enlarged abdominal or pelvic lymph nodes. Reproductive: Prostate is unremarkable. Other: Mild para muscular inflammatory fat stranding is seen along the lateral aspect of the left abdominal  wall. No abdominopelvic ascites. Musculoskeletal: No acute or significant osseous findings. IMPRESSION: 1. Mild right upper lobe, lingular and left basilar atelectatic changes 2. Very small left pleural effusion. 3. 1.3 cm area of perivascular fat attenuation along the infrahilar region on the right, consistent with a small lipoma. 4. Mild bruising along the lateral aspect of the left abdominal wall. 5. Sigmoid diverticulosis. 6. Marked severity coronary artery calcification. 7. Aortic atherosclerosis. Aortic Atherosclerosis (ICD10-I70.0). Electronically Signed   By: Virgina Norfolk M.D.   On: 12/14/2021 16:07   DG Ribs Unilateral W/Chest Left  Result Date: 12/14/2021 CLINICAL DATA:  Cough and shortness of breath. Left posterolateral chest wall pain. EXAM: LEFT RIBS AND CHEST - 3+ VIEW COMPARISON:  04/28/2020. FINDINGS: No fracture or bone lesion. Cardiac silhouette normal in size. No mediastinal or hilar masses. Minor linear opacity at the left lateral lung base consistent with atelectasis. Remainder of the lungs is clear. No pleural effusion or pneumothorax. IMPRESSION: 1. No rib fracture or rib lesion. 2. No active cardiopulmonary disease. Electronically Signed   By: Lajean Manes M.D.   On: 12/14/2021 13:16    EKG: None   Assessment/Plan Principal Problem:    Hyponatremia Active Problems:   Vomiting   HTN (hypertension)   Alcohol abuse   Discoloration of skin of flank resembling ecchymosis   Coronary artery calcification  Assessment and Plan: * Hyponatremia Na 123. Baseline over the past year- 130 - 138. Likely 2/2 to both alcohol abuse and GI losses- Vomiting and diarrhea. - 1 L bolus given, continue N/s 100cc/hr x 20 htrs - BMP a.m - Serum and urine osmolality - Urine Sodium - Recent normal TSH- 2.1  Coronary artery calcification CT shows marked severity coronary artery calcification.  He denies any chest pain at this time.  He is on 162 mg of aspirin daily -Follow-up as outpatient -Resume statins  Discoloration of skin of flank resembling ecchymosis Large area of ecchymosis to left flank.  Denies any form of trauma to the area.  Denies loss of consciousness or intoxication that could have caused this.  Hemoglobin 10.5, baseline 11-13.  CT shows mild bruising around lateral aspect of left abdominal wall.  He is on '162mg'$  of aspirin daily. -Recheck CBC in the morning -Hold pharmacologic DVT prophylaxis  Alcohol abuse Drinks 1 -2,  42 ounce bottles daily. Last drink yesterday evening, about 24 hours ago. - CIWA PRN -Thiamine folate multivitamins -Magnesium 1.8, Phos 3.3  HTN (hypertension) Stable. -Resume Norvasc - Hold losartan for contrast exposure  Vomiting Reports multiple episodes of vomiting and diarrhea.  Reports recent antibiotic use for tooth extraction.  Reports epigastric pain-reflux-like.  CT abdomen chest and pelvis without acute intra-abdominal pathology. -Recent GI notes 10/23/2021, his diarrhea and epigastric pain are not new.  Thought secondary to IBS, possibly pancreatic insufficiency in setting of chronic alcohol abuse. -Stool C. Difficile -Hydrate    DVT prophylaxis: SCDS Code Status: Full Family Communication: None at bedside Disposition Plan: ~ 1 - 2 days Consults called: None Admission status:  Obs  tele  Author: Bethena Roys, MD 12/14/2021 7:40 PM  For on call review [email or web address].

## 2021-12-14 NOTE — ED Provider Notes (Signed)
[facility] EMERGENCY DEPARTMENT Provider Note   CSN: [id number] Arrival date & time: 12/14/21  1155     History  Chief Complaint  Patient presents with   Emesis    Gabriel Koch is a 56 y.o. male.  HPI  56 year old male with past medical history of HTN, alcohol abuse, GERD presents to the emergency department with concern for cough and left-sided abdominal pain/bruising.  Patient also has associated nausea/vomiting.  He states that his cough and symptoms started about a week ago, about 4 days ago he noticed a left-sided bruising.  Does not take any anticoagulation.  Denies any trauma to the area.  Currently he is complaining of left-sided chest/abdominal pain with nausea.  Denies any fever.  He has had intermittent diarrhea this been nonbloody.  No swelling of his lower extremities.  Home Medications Prior to Admission medications   Medication Sig Start Date End Date Taking? Authorizing Provider  amLODipine (NORVASC) 5 MG tablet Take 5 mg by mouth daily. 01/27/21   [provider]  atorvastatin (LIPITOR) 40 MG tablet Take 40 mg by mouth daily. Patient not taking: Reported on 03/19/2021 01/27/21   [provider]  busPIRone (BUSPAR) 10 MG tablet Take 10 mg by mouth 2 (two) times daily. 01/27/21   [provider]  esomeprazole (NEXIUM) 40 MG capsule Take 1 capsule (40 mg total) by mouth in the morning and at bedtime. 10/23/21 10/23/22  Eloise Harman, DO  folic acid (FOLVITE) 1 MG tablet Take 1 mg by mouth daily. Patient not taking: Reported on 03/19/2021 01/27/21   [provider]  HYDROcodone-acetaminophen (NORCO/VICODIN) 5-325 MG tablet One tablet every six hours for pain.  Limit 7 days. Patient not taking: Reported on 03/19/2021 11/12/20   Sanjuana Kava, MD  losartan (COZAAR) 50 MG tablet Take 1 tablet (50 mg total) by mouth daily. 04/30/20   Roxan Hockey, MD  meloxicam (MOBIC) 7.5 MG tablet Take 7.5 mg by mouth daily.    [provider]  naproxen (NAPROSYN) 500 MG tablet Take 1 tablet (500 mg total) by mouth 2 (two) times daily with a meal. Patient not taking: Reported on 10/23/2021 12/10/20   Sanjuana Kava, MD  ondansetron (ZOFRAN) 4 MG tablet TAKE 1 TABLET EVERY 8 HOURS AS NEEDEDFOR NAUSEA & VOMITING 11/19/21   Eloise Harman, DO  penicillin v potassium (VEETID) 500 MG tablet Take 500 mg by mouth 4 (four) times daily.    [provider]  potassium chloride SA (KLOR-CON) 20 MEQ tablet Take 20 mEq by mouth 3 (three) times daily. Patient not taking: Reported on 03/19/2021 01/27/21   [provider]  traZODone (DESYREL) 100 MG tablet Take 100 mg by mouth at bedtime. 01/27/21   [provider]  zolpidem (AMBIEN) 5 MG tablet Take 5 mg by mouth at bedtime as needed for sleep.    [provider]      Allergies    Bee venom    Review of Systems   Review of Systems  Constitutional:  Positive for fatigue. Negative for fever.  Respiratory:  Positive for shortness of breath.   Cardiovascular:  Positive for chest pain.  Gastrointestinal:  Positive for abdominal pain and nausea. Negative for diarrhea and vomiting.  Skin:  Negative for rash.  Neurological:  Negative for headaches.    Physical Exam Updated Vital Signs BP 131/77   Pulse 87   Temp 98 F (36.7 C) (Oral)   Resp 20   Ht '5\' 9"'$  (1.753  m)   Wt 78 kg   SpO2 99%   BMI 25.40 kg/m  Physical Exam Vitals and nursing note reviewed.  Constitutional:      General: He is not in acute distress.    Appearance: Normal appearance. He is not ill-appearing.  HENT:     Head: Normocephalic.     Mouth/Throat:     Mouth: Mucous membranes are moist.  Cardiovascular:     Rate and Rhythm: Normal rate.  Pulmonary:     Effort: Pulmonary effort is normal. No respiratory distress.     Breath sounds: Rales present.  Abdominal:     Palpations: Abdomen is soft.     Tenderness: There is no abdominal tenderness. There is guarding.      Comments: Large left-sided/flank hematoma that is tender to palpation  Skin:    General: Skin is warm.  Neurological:     Mental Status: He is alert and oriented to person, place, and time. Mental status is at baseline.  Psychiatric:        Mood and Affect: Mood normal.     ED Results / Procedures / Treatments   Labs (all labs ordered are listed, but only abnormal results are displayed) Labs Reviewed  COMPREHENSIVE METABOLIC PANEL - Abnormal; Notable for the following components:      Result Value   Sodium 123 (*)    Chloride 93 (*)    CO2 19 (*)    All other components within normal limits  CBC WITH DIFFERENTIAL/PLATELET - Abnormal; Notable for the following components:   RBC 3.23 (*)    Hemoglobin 10.5 (*)    HCT 30.9 (*)    RDW 17.3 (*)    All other components within normal limits  PROTIME-INR  LIPASE, BLOOD    EKG None  Radiology DG Ribs Unilateral W/Chest Left  Result Date: 12/14/2021 CLINICAL DATA:  Cough and shortness of breath. Left posterolateral chest wall pain. EXAM: LEFT RIBS AND CHEST - 3+ VIEW COMPARISON:  04/28/2020. FINDINGS: No fracture or bone lesion. Cardiac silhouette normal in size. No mediastinal or hilar masses. Minor linear opacity at the left lateral lung base consistent with atelectasis. Remainder of the lungs is clear. No pleural effusion or pneumothorax. IMPRESSION: 1. No rib fracture or rib lesion. 2. No active cardiopulmonary disease. Electronically Signed   By: Lajean Manes M.D.   On: 12/14/2021 13:16    Procedures Procedures    Medications Ordered in ED Medications  sodium chloride 0.9 % bolus 1,000 mL (has no administration in time range)  morphine (PF) 4 MG/ML injection 4 mg (has no administration in time range)  ondansetron (ZOFRAN) injection 4 mg (has no administration in time range)  metoCLOPramide (REGLAN) injection 5 mg (has no administration in time range)  famotidine (PEPCID) IVPB 20 mg premix (has no administration in time  range)    ED Course/ Medical Decision Making/ A&P                           Medical Decision Making Amount and/or Complexity of Data Reviewed Labs: ordered. Radiology: ordered.  Risk Prescription drug management.   56 year old male presents emergency department with cough, left-sided hematoma/abdominal pain, nausea/vomiting.  Patient denies any trauma to the left side of the body.  He has an obvious hematoma that is tender to palpation, abdomen is slightly distended but he is his baseline.  Vitals are stable on arrival, he is not anticoagulated.  Initial blood work  shows a slight anemia of 10.5 down from baseline of 11-12.  He is hyponatremic at 123, possibly from alcohol use.  We will plan for hydration, further lab evaluation and CT of the chest abdomen pelvis to evaluate in regards to hematoma/possible trauma.        Final Clinical Impression(s) / ED Diagnoses Final diagnoses:  None    Rx / DC Orders ED Discharge Orders     None         Lorelle Gibbs, DO 12/14/21 1458

## 2021-12-14 NOTE — Assessment & Plan Note (Addendum)
Large area of ecchymosis to left flank.  Denies any form of trauma to the area.  Denies loss of consciousness or intoxication that could have caused this.  Hemoglobin 10.5, baseline 11-13.  CT shows mild bruising around lateral aspect of left abdominal wall.  He is on '162mg'$  of aspirin daily. -Recheck CBC in the morning -Hold pharmacologic DVT prophylaxis

## 2021-12-14 NOTE — Assessment & Plan Note (Addendum)
Reports multiple episodes of vomiting and diarrhea.  Reports recent antibiotic use for tooth extraction.  Reports epigastric pain-reflux-like.  CT abdomen chest and pelvis without acute intra-abdominal pathology. -Recent GI notes 10/23/2021, his diarrhea and epigastric pain are not new.  Thought secondary to IBS, possibly pancreatic insufficiency in setting of chronic alcohol abuse. -Stool C. Difficile -Hydrate

## 2021-12-14 NOTE — ED Notes (Signed)
Admitting MD in to see, at [facility].

## 2021-12-14 NOTE — ED Triage Notes (Addendum)
Pt drinking soda in triage but states with emesis since Tuesday, instructed pt not to drink anymore.  Pt states bruise left flank since Tuesday, denies any injury.  + cough.  Emesis started Tuesday.  Denies blood thinners currently. Burning to epigastric area.

## 2021-12-14 NOTE — ED Notes (Signed)
Back from CT, alert, NAD, calm, interactive, verbalizes movement increased pain. Returned to monitor, VSS.

## 2021-12-14 NOTE — ED Notes (Signed)
"  Feeling better", to CT via stretcher

## 2021-12-14 NOTE — ED Notes (Signed)
Large L flank/ side bruise noted. No known injury. C/o side pain, sob, cough, post tussive emesis. Smoker. Uses inhalers. Denies fever or nausea. Cough easily exacerbated.

## 2021-12-14 NOTE — Assessment & Plan Note (Signed)
Drinks 1 -2,  42 ounce bottles daily. Last drink yesterday evening, about 24 hours ago. - CIWA PRN -Thiamine folate multivitamins -Magnesium 1.8, Phos 3.3 -continue librium 25 mg po TID to try to avoid acute alcohol withdrawal  -check urine toxicology screen --> benzos only

## 2021-12-14 NOTE — Assessment & Plan Note (Addendum)
CT shows marked severity coronary artery calcification.  He denies any chest pain at this time.  He is on 162 mg of aspirin daily -Follow-up as outpatient -Resume statins

## 2021-12-15 DIAGNOSIS — W19XXXA Unspecified fall, initial encounter: Secondary | ICD-10-CM | POA: Diagnosis present

## 2021-12-15 DIAGNOSIS — L819 Disorder of pigmentation, unspecified: Secondary | ICD-10-CM | POA: Diagnosis not present

## 2021-12-15 DIAGNOSIS — F17210 Nicotine dependence, cigarettes, uncomplicated: Secondary | ICD-10-CM | POA: Diagnosis present

## 2021-12-15 DIAGNOSIS — K219 Gastro-esophageal reflux disease without esophagitis: Secondary | ICD-10-CM | POA: Diagnosis present

## 2021-12-15 DIAGNOSIS — F1010 Alcohol abuse, uncomplicated: Secondary | ICD-10-CM | POA: Diagnosis present

## 2021-12-15 DIAGNOSIS — Z9049 Acquired absence of other specified parts of digestive tract: Secondary | ICD-10-CM | POA: Diagnosis not present

## 2021-12-15 DIAGNOSIS — Z91030 Bee allergy status: Secondary | ICD-10-CM | POA: Diagnosis not present

## 2021-12-15 DIAGNOSIS — K582 Mixed irritable bowel syndrome: Secondary | ICD-10-CM | POA: Diagnosis present

## 2021-12-15 DIAGNOSIS — S301XXA Contusion of abdominal wall, initial encounter: Secondary | ICD-10-CM | POA: Diagnosis present

## 2021-12-15 DIAGNOSIS — R112 Nausea with vomiting, unspecified: Secondary | ICD-10-CM | POA: Diagnosis present

## 2021-12-15 DIAGNOSIS — I2510 Atherosclerotic heart disease of native coronary artery without angina pectoris: Secondary | ICD-10-CM | POA: Diagnosis present

## 2021-12-15 DIAGNOSIS — E871 Hypo-osmolality and hyponatremia: Secondary | ICD-10-CM | POA: Diagnosis present

## 2021-12-15 DIAGNOSIS — Z791 Long term (current) use of non-steroidal anti-inflammatories (NSAID): Secondary | ICD-10-CM | POA: Diagnosis not present

## 2021-12-15 DIAGNOSIS — F419 Anxiety disorder, unspecified: Secondary | ICD-10-CM | POA: Diagnosis present

## 2021-12-15 DIAGNOSIS — I10 Essential (primary) hypertension: Secondary | ICD-10-CM | POA: Diagnosis present

## 2021-12-15 DIAGNOSIS — Z7982 Long term (current) use of aspirin: Secondary | ICD-10-CM | POA: Diagnosis not present

## 2021-12-15 DIAGNOSIS — K8689 Other specified diseases of pancreas: Secondary | ICD-10-CM | POA: Diagnosis present

## 2021-12-15 DIAGNOSIS — F32A Depression, unspecified: Secondary | ICD-10-CM | POA: Diagnosis present

## 2021-12-15 DIAGNOSIS — Z79899 Other long term (current) drug therapy: Secondary | ICD-10-CM | POA: Diagnosis not present

## 2021-12-15 LAB — CBC
HCT: 29.3 % — ABNORMAL LOW (ref 39.0–52.0)
Hemoglobin: 9.9 g/dL — ABNORMAL LOW (ref 13.0–17.0)
MCH: 32.9 pg (ref 26.0–34.0)
MCHC: 33.8 g/dL (ref 30.0–36.0)
MCV: 97.3 fL (ref 80.0–100.0)
Platelets: 182 10*3/uL (ref 150–400)
RBC: 3.01 MIL/uL — ABNORMAL LOW (ref 4.22–5.81)
RDW: 17.6 % — ABNORMAL HIGH (ref 11.5–15.5)
WBC: 3.3 10*3/uL — ABNORMAL LOW (ref 4.0–10.5)
nRBC: 0 % (ref 0.0–0.2)

## 2021-12-15 LAB — C DIFFICILE QUICK SCREEN W PCR REFLEX
C Diff antigen: NEGATIVE
C Diff interpretation: NOT DETECTED
C Diff toxin: NEGATIVE

## 2021-12-15 LAB — MAGNESIUM: Magnesium: 1.8 mg/dL (ref 1.7–2.4)

## 2021-12-15 LAB — RAPID URINE DRUG SCREEN, HOSP PERFORMED
Amphetamines: NOT DETECTED
Barbiturates: NOT DETECTED
Benzodiazepines: NOT DETECTED
Cocaine: NOT DETECTED
Opiates: POSITIVE — AB
Tetrahydrocannabinol: NOT DETECTED

## 2021-12-15 LAB — LIPASE, BLOOD: Lipase: 77 U/L — ABNORMAL HIGH (ref 11–51)

## 2021-12-15 LAB — BASIC METABOLIC PANEL
Anion gap: 6 (ref 5–15)
BUN: 8 mg/dL (ref 6–20)
CO2: 23 mmol/L (ref 22–32)
Calcium: 8.6 mg/dL — ABNORMAL LOW (ref 8.9–10.3)
Chloride: 99 mmol/L (ref 98–111)
Creatinine, Ser: 0.82 mg/dL (ref 0.61–1.24)
GFR, Estimated: >60 mL/min (ref >60)
Glucose, Bld: 100 mg/dL — ABNORMAL HIGH (ref 70–99)
Potassium: 4.1 mmol/L (ref 3.5–5.1)
Sodium: 128 mmol/L — ABNORMAL LOW (ref 135–145)

## 2021-12-15 LAB — HIV ANTIBODY (ROUTINE TESTING W REFLEX): HIV Screen 4th Generation wRfx: NONREACTIVE

## 2021-12-15 LAB — OSMOLALITY, URINE: Osmolality, Ur: 338 mOsm/kg (ref 300–900)

## 2021-12-15 LAB — OSMOLALITY: Osmolality: 301 mOsm/kg — ABNORMAL HIGH (ref 275–295)

## 2021-12-15 MED ORDER — OXYCODONE HCL 5 MG PO TABS
5.0000 mg | ORAL_TABLET | Freq: Four times a day (QID) | ORAL | Status: DC | PRN
  Administered 2021-12-15 – 2021-12-17 (×6): 5 mg via ORAL
  Filled 2021-12-15 (×7): qty 1

## 2021-12-15 MED ORDER — LOSARTAN POTASSIUM 50 MG PO TABS
50.0000 mg | ORAL_TABLET | Freq: Every day | ORAL | Status: DC
  Administered 2021-12-15 – 2021-12-17 (×3): 50 mg via ORAL
  Filled 2021-12-15 (×3): qty 1

## 2021-12-15 MED ORDER — ATORVASTATIN CALCIUM 10 MG PO TABS
10.0000 mg | ORAL_TABLET | Freq: Every evening | ORAL | Status: DC
  Administered 2021-12-15 – 2021-12-16 (×2): 10 mg via ORAL
  Filled 2021-12-15 (×2): qty 1

## 2021-12-15 MED ORDER — AMLODIPINE BESYLATE 5 MG PO TABS
5.0000 mg | ORAL_TABLET | Freq: Every day | ORAL | Status: DC
  Administered 2021-12-15 – 2021-12-17 (×3): 5 mg via ORAL
  Filled 2021-12-15 (×3): qty 1

## 2021-12-15 MED ORDER — PANTOPRAZOLE SODIUM 40 MG PO TBEC
40.0000 mg | DELAYED_RELEASE_TABLET | Freq: Two times a day (BID) | ORAL | Status: DC
  Administered 2021-12-15 – 2021-12-17 (×5): 40 mg via ORAL
  Filled 2021-12-15 (×5): qty 1

## 2021-12-15 MED ORDER — PHENOL 1.4 % MT LIQD: 1.0000 | OROMUCOSAL | Status: DC | PRN

## 2021-12-15 MED ORDER — LORATADINE 10 MG PO TABS
10.0000 mg | ORAL_TABLET | Freq: Every day | ORAL | Status: DC | PRN
  Administered 2021-12-15 – 2021-12-16 (×2): 10 mg via ORAL
  Filled 2021-12-15 (×2): qty 1

## 2021-12-15 MED ORDER — ACETAMINOPHEN 650 MG RE SUPP: 325.0000 mg | Freq: Four times a day (QID) | RECTAL | Status: DC | PRN

## 2021-12-15 MED ORDER — SODIUM CHLORIDE 0.9 % IV SOLN: INTRAVENOUS | Status: DC

## 2021-12-15 MED ORDER — CHLORDIAZEPOXIDE HCL 25 MG PO CAPS
25.0000 mg | ORAL_CAPSULE | Freq: Three times a day (TID) | ORAL | Status: DC
  Administered 2021-12-15 – 2021-12-17 (×7): 25 mg via ORAL
  Filled 2021-12-15 (×7): qty 1

## 2021-12-15 MED ORDER — MAGNESIUM SULFATE 2 GM/50ML IV SOLN
2.0000 g | Freq: Once | INTRAVENOUS | Status: AC
  Administered 2021-12-15: 2 g via INTRAVENOUS
  Filled 2021-12-15: qty 50

## 2021-12-15 MED ORDER — ACETAMINOPHEN 325 MG PO TABS
325.0000 mg | ORAL_TABLET | Freq: Four times a day (QID) | ORAL | Status: DC | PRN
  Administered 2021-12-17: 325 mg via ORAL
  Filled 2021-12-15: qty 1

## 2021-12-15 MED ORDER — TRAZODONE HCL 50 MG PO TABS
100.0000 mg | ORAL_TABLET | Freq: Every evening | ORAL | Status: DC | PRN
  Administered 2021-12-15 – 2021-12-16 (×2): 100 mg via ORAL
  Filled 2021-12-15 (×2): qty 2

## 2021-12-15 MED ORDER — PROCHLORPERAZINE EDISYLATE 10 MG/2ML IJ SOLN: 10.0000 mg | INTRAMUSCULAR | Status: DC | PRN

## 2021-12-15 NOTE — Progress Notes (Signed)
PROGRESS NOTE   NUH LIPTON  XLK:[id number] DOB: October 14, 1965 DOA: 12/14/2021 PCP: Abran Richard, MD   Chief Complaint  Patient presents with   Emesis   Level of care: Telemetry  Brief Admission History:   56 y.o. male with medical history significant for hypertension, stroke, anxiety and depression, alcohol abuse. Patient presented to the ED with complaints of vomiting of 5 days duration, with cough for about a week duration.  Cough is productive of white phlegm, reports was diagnosed with laryngitis.  He also has several other complaints which include pain in his upper abdomen that feels like his usual reflux pain.  He reports multiple episodes of vomiting, no blood.  He also reports diarrhea, 5-6 times yesterday.  No blood. He noticed bruising to the left side of his abdomen for 5 days, he reports today the bruising got worse.  He denies any form of trauma to the area, no falls, he denies being physical with any questions, denies any form of assault.  Reports only holding his left side when coughing. Denies losing consciousness.  He reports history of blackouts, reports these are very seldom, last episode was about a month ago.  He reports that his black outs started after he was diagnosed with stroke.  He is very sure he did not blackout and hence sustained injury to his left side.  No history of seizures.  He does admit to daily alcohol intake, and has the shakes when he does not drink alcohol. He also reports mild headaches over the past week. Received some antibiotics about a month ago for tooth extraction.     ED Course: Stable vitals. Na - 123.  CT Chest abd and pelvis- Mild bruising along the lateral aspect of the left abdominal  wall.  And marked severity coronary artery calcification. 1 L bolus given     Assessment and Plan: * Hyponatremia Na 123. Baseline over the past year- 130 - 138. Likely 2/2 to both alcohol abuse and GI losses- Vomiting and diarrhea. - sodium  improving with hydration   Discoloration of skin of flank resembling ecchymosis Large area of ecchymosis to left flank.  Denies any form of trauma to the area.  Denies loss of consciousness or intoxication that could have caused this.  Hemoglobin 10.5, baseline 11-13.  CT shows mild bruising around lateral aspect of left abdominal wall.  He is on '162mg'$  of aspirin daily. -Recheck CBC in the morning -Hold pharmacologic DVT prophylaxis  Vomiting Reports multiple episodes of vomiting and diarrhea.  Reports recent antibiotic use for tooth extraction.  Reports epigastric pain-reflux-like.  CT abdomen chest and pelvis without acute intra-abdominal pathology. -Recent GI notes 10/23/2021, his diarrhea and epigastric pain are not new.  Thought secondary to IBS, possibly pancreatic insufficiency in setting of chronic alcohol abuse. -Stool C. Difficile -Hydrate  Coronary artery calcification CT shows marked severity coronary artery calcification.  He denies any chest pain at this time.  He is on 162 mg of aspirin daily -Follow-up as outpatient -Resume statins  Alcohol abuse Drinks 1 -2,  42 ounce bottles daily. Last drink yesterday evening, about 24 hours ago. - CIWA PRN -Thiamine folate multivitamins -Magnesium 1.8, Phos 3.3 -added librium 25 mg po TID to try to avoid acute alcohol withdrawal  -check urine toxicology screen   HTN (hypertension) Stable. -Resume Norvasc - Hold losartan for contrast exposure  DVT prophylaxis: SCD Code Status: Full  Family Communication:  Disposition: Status is: Inpatient Remains inpatient appropriate because: IV fluid hydration  required    Consultants:   Procedures:   Antimicrobials:    Subjective: Pt upset about food choices and IV pole beeping.   Objective: Vitals:   12/14/21 1938 12/14/21 2155 12/15/21 0002 12/15/21 0525  BP: (!) 160/88 (!) 155/81 (!) 158/76 (!) 149/88  Pulse: 97 (!) 101 94 99  Resp: '20 19 19 18  '$ Temp: 98.6 F (37 C) 98.4 F  (36.9 C) 98.2 F (36.8 C) 98.8 F (37.1 C)  TempSrc: Oral Oral Oral Oral  SpO2: 99% 97% 99% 96%  Weight: 76.9 kg     Height: '5\' 9"'$  (1.753 m)       Intake/Output Summary (Last 24 hours) at 12/15/2021 1325 Last data filed at 12/15/2021 1200 Gross per 24 hour  Intake 3059.04 ml  Output 1725 ml  Net 1334.04 ml   Filed Weights   12/14/21 1212 12/14/21 1938  Weight: 78 kg 76.9 kg   Examination:  General exam: Appears calm and comfortable  Respiratory system: Clear to auscultation. Respiratory effort normal. Cardiovascular system: normal S1 & S2 heard. No JVD, murmurs, rubs, gallops or clicks. No pedal edema. Gastrointestinal system: Abdomen is nondistended, soft and nontender. No organomegaly or masses felt. Normal bowel sounds heard. Central nervous system: Alert and oriented. No focal neurological deficits. Extremities: Symmetric 5 x 5 power. Skin: No rashes, lesions or ulcers. Psychiatry: Judgement and insight appear normal. Mood & affect appropriate.   Data Reviewed: I have personally reviewed following labs and imaging studies  CBC: Recent Labs  Lab 12/14/21 1245 12/15/21 0506  WBC 5.2 3.3*  NEUTROABS 3.5  --   HGB 10.5* 9.9*  HCT 30.9* 29.3*  MCV 95.7 97.3  PLT 214 166    Basic Metabolic Panel: Recent Labs  Lab 12/14/21 1245 12/15/21 0506  NA 123* 128*  K 3.9 4.1  CL 93* 99  CO2 19* 23  GLUCOSE 81 100*  BUN 9 8  CREATININE 0.96 0.82  CALCIUM 8.9 8.6*  MG 1.8 1.8  PHOS 3.3  --     CBG: No results for input(s): "GLUCAP" in the last 168 hours.  Recent Results (from the past 240 hour(s))  C Difficile Quick Screen w PCR reflex     Status: None   Collection Time: 12/15/21  6:00 AM   Specimen: STOOL  Result Value Ref Range Status   C Diff antigen NEGATIVE NEGATIVE Final   C Diff toxin NEGATIVE NEGATIVE Final   C Diff interpretation No C. difficile detected.  Final    Comment: Performed at [facility], [address] [place], [place] [address]      Radiology Studies: CT CHEST ABDOMEN PELVIS W CONTRAST  Result Date: 12/14/2021 CLINICAL DATA:  Cough and vomiting with left-sided abdominal pain and a bruise to the left flank. EXAM: CT CHEST, ABDOMEN, AND PELVIS WITH CONTRAST TECHNIQUE: Multidetector CT imaging of the chest, abdomen and pelvis was performed following the standard protocol during bolus administration of intravenous contrast. RADIATION DOSE REDUCTION: This exam was performed according to the departmental dose-optimization program which includes automated exposure control, adjustment of the mA and/or kV according to patient size and/or use of iterative reconstruction technique. CONTRAST:  174m OMNIPAQUE IOHEXOL 300 MG/ML  SOLN COMPARISON:  None Available. FINDINGS: CT CHEST FINDINGS Cardiovascular: There is mild calcification of the aortic arch, without evidence of aortic aneurysm or dissection. Normal heart size with marked severity coronary artery calcification. No pericardial effusion. Mediastinum/Nodes: No enlarged mediastinal or hilar lymph nodes. A 1.4 cm left axillary lymph  node is seen. Thyroid gland, trachea, and esophagus demonstrate no significant findings. Lungs/Pleura: Mild, hazy right upper lobe atelectasis is seen, with additional areas of atelectasis noted within the lingular region and along the periphery of the left lung base. A 1.3 cm area of perivascular fat attenuation (-10.69 Hounsfield units) is seen along the infrahilar region on the right (axial CT image 44, CT series 2). A trace amount of pleural fluid is seen on the left. No pneumothorax is identified. Mild, nonspecific asymmetric thickening of the right hemidiaphragm is noted (axial CT images 55 through 71, CT series 2). Musculoskeletal: No chest wall mass or suspicious bone lesions identified. CT ABDOMEN PELVIS FINDINGS Hepatobiliary: No focal liver abnormality is seen. No gallstones, gallbladder wall thickening, or biliary dilatation. Pancreas: Unremarkable. No  pancreatic ductal dilatation or surrounding inflammatory changes. Spleen: Normal in size without focal abnormality. Adrenals/Urinary Tract: Adrenal glands are unremarkable. Kidneys are normal, without renal calculi, focal lesion, or hydronephrosis. Bladder is unremarkable. Stomach/Bowel: There is a small hiatal hernia. The appendix is surgically absent. No evidence of bowel wall thickening, distention, or inflammatory changes. Noninflamed diverticula are seen within the proximal sigmoid colon. Vascular/Lymphatic: Aortic atherosclerosis. No enlarged abdominal or pelvic lymph nodes. Reproductive: Prostate is unremarkable. Other: Mild para muscular inflammatory fat stranding is seen along the lateral aspect of the left abdominal wall. No abdominopelvic ascites. Musculoskeletal: No acute or significant osseous findings. IMPRESSION: 1. Mild right upper lobe, lingular and left basilar atelectatic changes 2. Very small left pleural effusion. 3. 1.3 cm area of perivascular fat attenuation along the infrahilar region on the right, consistent with a small lipoma. 4. Mild bruising along the lateral aspect of the left abdominal wall. 5. Sigmoid diverticulosis. 6. Marked severity coronary artery calcification. 7. Aortic atherosclerosis. Aortic Atherosclerosis (ICD10-I70.0). Electronically Signed   By: Virgina Norfolk M.D.   On: 12/14/2021 16:07   DG Ribs Unilateral W/Chest Left  Result Date: 12/14/2021 CLINICAL DATA:  Cough and shortness of breath. Left posterolateral chest wall pain. EXAM: LEFT RIBS AND CHEST - 3+ VIEW COMPARISON:  04/28/2020. FINDINGS: No fracture or bone lesion. Cardiac silhouette normal in size. No mediastinal or hilar masses. Minor linear opacity at the left lateral lung base consistent with atelectasis. Remainder of the lungs is clear. No pleural effusion or pneumothorax. IMPRESSION: 1. No rib fracture or rib lesion. 2. No active cardiopulmonary disease. Electronically Signed   By: Lajean Manes M.D.    On: 12/14/2021 13:16    Scheduled Meds:  amLODipine  5 mg Oral Daily   atorvastatin  10 mg Oral QPM   chlordiazePOXIDE  25 mg Oral TID   folic acid  1 mg Oral Daily   losartan  50 mg Oral Daily   multivitamin with minerals  1 tablet Oral Daily   nicotine  14 mg Transdermal Daily   pantoprazole  40 mg Oral BID AC   thiamine  100 mg Oral Daily   Or   thiamine  100 mg Intravenous Daily   Continuous Infusions:  sodium chloride 75 mL/hr at 12/15/21 0903     LOS: 0 days   Time spent: 38  mins  Breyer Tejera Wynetta Emery, MD How to contact the [facility] Attending or Consulting provider [place] or covering provider during after hours [place], for this patient?  Check the care team in [facility] and look for a) attending/consulting [facility] provider listed and b) the [facility] team listed Log into [email or web address] and use [place]'s universal password to access. If you do  not have the password, please contact the hospital operator. Locate the [facility] provider you are looking for under [facility] and page to a number that you can be directly reached. If you still have difficulty reaching the provider, please page the [facility] (Director on Call) for the Hospitalists listed on amion for assistance.  12/15/2021, 1:25 PM

## 2021-12-15 NOTE — Hospital Course (Addendum)
56 y.o. male with medical history significant for hypertension, stroke, anxiety and depression, alcohol abuse. Patient presented to the ED with complaints of vomiting of 5 days duration, with cough for about a week duration.  Cough is productive of white phlegm, reports was diagnosed with laryngitis.  He also has several other complaints which include pain in his upper abdomen that feels like his usual reflux pain.  He reports multiple episodes of vomiting, no blood.  He also reports diarrhea, 5-6 times yesterday.  No blood. He noticed bruising to the left side of his abdomen for 5 days, he reports today the bruising got worse.  He denies any form of trauma to the area, no falls, he denies being physical with any questions, denies any form of assault.  Reports only holding his left side when coughing. Denies losing consciousness.  He reports history of blackouts, reports these are very seldom, last episode was about a month ago.  He reports that his black outs started after he was diagnosed with stroke.  He is very sure he did not blackout and hence sustained injury to his left side.  No history of seizures.  He does admit to daily alcohol intake, and has the shakes when he does not drink alcohol. He also reports mild headaches over the past week. Received some antibiotics about a month ago for tooth extraction.     ED Course: Stable vitals. Na - 123.  CT Chest abd and pelvis- Mild bruising along the lateral aspect of the left abdominal  wall.  And marked severity coronary artery calcification. 1 L bolus given  12/16/2021: Pt still having frequent loose stools, awaiting GIP.  Oral intake slowly improving. Remains on IV fluid. Remains on CIWA.  Pt says librium is helping.

## 2021-12-15 NOTE — TOC Progression Note (Signed)
Transition of Care [facility]) - Progression Note    Patient Details  Name: Gabriel Koch MRN: [id number] Date of Birth: 1966/06/09  Transition of Care [facility]) CM/SW Contact  Salome Arnt, [place] Phone Number: 12/15/2021, 9:53 AM  Clinical Narrative:  Transition of Care [facility]) Screening Note   Patient Details  Name: Gabriel Koch Date of Birth: [birth year]   Transition of Care [facility]) CM/SW Contact:    Salome Arnt, [place] Phone Number: 12/15/2021, 9:53 AM    Transition of Care Department [facility]) has reviewed patient and no TOC needs have been identified at this time. We will continue to monitor patient advancement through interdisciplinary progression rounds. If new patient transition needs arise, please place a TOC consult.          Barriers to Discharge: Continued Medical Work up  Expected Discharge Plan and Services                                                 Social Determinants of Health (SDOH) Interventions    Readmission Risk Interventions     No data to display

## 2021-12-15 NOTE — Progress Notes (Signed)
Initial Nutrition Assessment  DOCUMENTATION CODES:      INTERVENTION:  Soft diet   Snacks as desired  Multivitamin daily  NUTRITION DIAGNOSIS:   Inadequate oral intake related to vomiting, diarrhea as evidenced by per patient/family report.   GOAL:  Patient will meet greater than or equal to 90% of their needs   MONITOR:  PO intake, Labs, Supplement acceptance  REASON FOR ASSESSMENT:   Malnutrition Screening Tool    ASSESSMENT: Patient is a 56 yo male with emesis and diarrhea reported at admission.PMH: stroke, HTN, chronic alcohol use, anxiety and depression. Hyponatremia.    Patient is tolerating soft foods. No vomiting today. He is complaining of pain in left arm and side. He was able to eat frozen dinner last night, an egg and toast for breakfast and ~ 50% of lunch. Patient has ordered a hamburger for dinner.   Patient  doesn't drink Ensure or Boost products. Feeds himself. Poor dentition. Acute inadequate oral intake related to emesis PTA. He likes graham crackers, peanut butter and ice cream.   Patient is up to the bathroom independently and sitting up in bed with his legs crossed under him.   Weight history reviewed. No significant change- overall gain (5-7 kg) since last September.   Medications reviewed and include: MVI, Folic acid, Lipitor, Nicoderm, Thiamine, librium.   IVF- NS@ 75 ml/hr  Labs:    Latest Ref Rng & Units 12/15/2021    5:06 AM 12/14/2021   12:45 PM 10/29/2021   11:03 AM  BMP  Glucose 70 - 99 mg/dL 100  81  107   BUN 6 - 20 mg/dL '8  9  7   '$ Creatinine 0.61 - 1.24 mg/dL 0.82  0.96  1.06   Sodium 135 - 145 mmol/L 128  123  130   Potassium 3.5 - 5.1 mmol/L 4.1  3.9  3.8   Chloride 98 - 111 mmol/L 99  93  97   CO2 22 - 32 mmol/L '23  19  23   '$ Calcium 8.9 - 10.3 mg/dL 8.6  8.9  8.9       Diet Order:   Diet Order             DIET SOFT Room service appropriate? Yes; Fluid consistency: Thin  Diet effective now                    EDUCATION NEEDS:  Education needs have been addressed  Skin:  Skin Assessment: Reviewed RN Assessment  Last BM:  6/10  Height:   Ht Readings from Last 1 Encounters:  12/14/21 '5\' 9"'$  (1.753 m)    Weight:   Wt Readings from Last 1 Encounters:  12/14/21 76.9 kg    Ideal Body Weight:   73 kg  BMI:  Body mass index is 25.03 kg/m.  Estimated Nutritional Needs:   Kcal:  2200-2400  Protein:  92-100 gr  Fluid:  2 liters daily  Colman Cater MS,RD,CSG,LDN Contact: Shea Evans

## 2021-12-16 DIAGNOSIS — F1010 Alcohol abuse, uncomplicated: Secondary | ICD-10-CM | POA: Diagnosis not present

## 2021-12-16 DIAGNOSIS — L819 Disorder of pigmentation, unspecified: Secondary | ICD-10-CM | POA: Diagnosis not present

## 2021-12-16 DIAGNOSIS — I2510 Atherosclerotic heart disease of native coronary artery without angina pectoris: Secondary | ICD-10-CM | POA: Diagnosis not present

## 2021-12-16 DIAGNOSIS — E871 Hypo-osmolality and hyponatremia: Secondary | ICD-10-CM | POA: Diagnosis not present

## 2021-12-16 LAB — MAGNESIUM: Magnesium: 2.1 mg/dL (ref 1.7–2.4)

## 2021-12-16 LAB — COMPREHENSIVE METABOLIC PANEL
ALT: 17 U/L (ref 0–44)
AST: 26 U/L (ref 15–41)
Albumin: 3.2 g/dL — ABNORMAL LOW (ref 3.5–5.0)
Alkaline Phosphatase: 71 U/L (ref 38–126)
Anion gap: 9 (ref 5–15)
BUN: 8 mg/dL (ref 6–20)
CO2: 22 mmol/L (ref 22–32)
Calcium: 8.7 mg/dL — ABNORMAL LOW (ref 8.9–10.3)
Chloride: 101 mmol/L (ref 98–111)
Creatinine, Ser: 0.88 mg/dL (ref 0.61–1.24)
GFR, Estimated: >60 mL/min (ref >60)
Glucose, Bld: 102 mg/dL — ABNORMAL HIGH (ref 70–99)
Potassium: 3.9 mmol/L (ref 3.5–5.1)
Sodium: 132 mmol/L — ABNORMAL LOW (ref 135–145)
Total Bilirubin: 0.6 mg/dL (ref 0.3–1.2)
Total Protein: 7.2 g/dL (ref 6.5–8.1)

## 2021-12-16 LAB — GASTROINTESTINAL PANEL BY PCR, STOOL (REPLACES STOOL CULTURE)

## 2021-12-16 LAB — CBC
HCT: 29.6 % — ABNORMAL LOW (ref 39.0–52.0)
Hemoglobin: 9.5 g/dL — ABNORMAL LOW (ref 13.0–17.0)
MCH: 32.1 pg (ref 26.0–34.0)
MCHC: 32.1 g/dL (ref 30.0–36.0)
MCV: 100 fL (ref 80.0–100.0)
Platelets: 197 10*3/uL (ref 150–400)
RBC: 2.96 MIL/uL — ABNORMAL LOW (ref 4.22–5.81)
RDW: 17.5 % — ABNORMAL HIGH (ref 11.5–15.5)
WBC: 3.1 10*3/uL — ABNORMAL LOW (ref 4.0–10.5)
nRBC: 0 % (ref 0.0–0.2)
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
## 2021-12-16 NOTE — Assessment & Plan Note (Signed)
--   he is currently having frequent loose stools -- follow up GIP [c diff test was negative]

## 2021-12-17 DIAGNOSIS — E871 Hypo-osmolality and hyponatremia: Secondary | ICD-10-CM | POA: Diagnosis not present

## 2021-12-17 LAB — CBC
HCT: 26.3 % — ABNORMAL LOW (ref 39.0–52.0)
Hemoglobin: 8.4 g/dL — ABNORMAL LOW (ref 13.0–17.0)
MCH: 32.3 pg (ref 26.0–34.0)
MCHC: 31.9 g/dL (ref 30.0–36.0)
MCV: 101.2 fL — ABNORMAL HIGH (ref 80.0–100.0)
Platelets: 188 10*3/uL (ref 150–400)
RBC: 2.6 MIL/uL — ABNORMAL LOW (ref 4.22–5.81)
RDW: 17.7 % — ABNORMAL HIGH (ref 11.5–15.5)
WBC: 3.3 10*3/uL — ABNORMAL LOW (ref 4.0–10.5)
nRBC: 0 % (ref 0.0–0.2)

## 2021-12-17 LAB — COMPREHENSIVE METABOLIC PANEL
ALT: 17 U/L (ref 0–44)
AST: 23 U/L (ref 15–41)
Albumin: 2.9 g/dL — ABNORMAL LOW (ref 3.5–5.0)
Alkaline Phosphatase: 64 U/L (ref 38–126)
Anion gap: 8 (ref 5–15)
BUN: 9 mg/dL (ref 6–20)
CO2: 21 mmol/L — ABNORMAL LOW (ref 22–32)
Calcium: 8.5 mg/dL — ABNORMAL LOW (ref 8.9–10.3)
Chloride: 102 mmol/L (ref 98–111)
Creatinine, Ser: 0.87 mg/dL (ref 0.61–1.24)
GFR, Estimated: >60 mL/min (ref >60)
Glucose, Bld: 103 mg/dL — ABNORMAL HIGH (ref 70–99)
Potassium: 3.8 mmol/L (ref 3.5–5.1)
Sodium: 131 mmol/L — ABNORMAL LOW (ref 135–145)
Total Bilirubin: 0.6 mg/dL (ref 0.3–1.2)
Total Protein: 6.5 g/dL (ref 6.5–8.1)

## 2021-12-17 LAB — MAGNESIUM: Magnesium: 1.8 mg/dL (ref 1.7–2.4)

## 2021-12-17 MED ORDER — ASPIRIN 81 MG PO TBEC: 81.0000 mg | DELAYED_RELEASE_TABLET | Freq: Every day | ORAL | 2 refills | Status: AC

## 2021-12-17 MED ORDER — NICOTINE 14 MG/24HR TD PT24: 14.0000 mg | MEDICATED_PATCH | Freq: Every day | TRANSDERMAL | 0 refills | Status: DC

## 2021-12-17 MED ORDER — LOSARTAN POTASSIUM 50 MG PO TABS: 50.0000 mg | ORAL_TABLET | Freq: Every day | ORAL | 3 refills | Status: DC

## 2021-12-17 MED ORDER — TRAZODONE HCL 100 MG PO TABS: 100.0000 mg | ORAL_TABLET | Freq: Every day | ORAL | 2 refills | Status: DC

## 2021-12-17 MED ORDER — FOLIC ACID 1 MG PO TABS: 1.0000 mg | ORAL_TABLET | Freq: Every day | ORAL | 3 refills | Status: DC

## 2021-12-17 MED ORDER — CHLORDIAZEPOXIDE HCL 25 MG PO CAPS: 25.0000 mg | ORAL_CAPSULE | ORAL | 0 refills | Status: DC

## 2021-12-17 MED ORDER — THIAMINE HCL 100 MG PO TABS: 100.0000 mg | ORAL_TABLET | Freq: Every day | ORAL | 2 refills | Status: DC

## 2021-12-17 MED ORDER — ATORVASTATIN CALCIUM 20 MG PO TABS: 20.0000 mg | ORAL_TABLET | Freq: Every evening | ORAL | 2 refills | Status: DC

## 2021-12-17 MED ORDER — ADULT MULTIVITAMIN W/MINERALS CH: 1.0000 | ORAL_TABLET | Freq: Every day | ORAL | 3 refills | Status: DC

## 2021-12-17 MED ORDER — BUSPIRONE HCL 10 MG PO TABS: 10.0000 mg | ORAL_TABLET | Freq: Two times a day (BID) | ORAL | 3 refills | Status: DC

## 2021-12-17 MED ORDER — PANTOPRAZOLE SODIUM 40 MG PO TBEC: 40.0000 mg | DELAYED_RELEASE_TABLET | Freq: Every day | ORAL | 3 refills | Status: DC

## 2021-12-17 MED ORDER — AMLODIPINE BESYLATE 10 MG PO TABS: 10.0000 mg | ORAL_TABLET | Freq: Every day | ORAL | 2 refills | Status: DC

## 2021-12-17 NOTE — Discharge Instructions (Signed)
1)Take  Librium (Chlordiazepoxide) 1 cap twice daily x 2 days , then 1 tablet daily for 2 days--to help you quit drinking alcohol 2)follow up with Abran Richard, MD (primary care provider) for repeat CBC and CMP blood test within a week 3) complete abstinence from tobacco and alcohol strongly advised 4) outpatient inpatient alcohol rehab program strongly advised
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
## 2022-01-07 ENCOUNTER — Telehealth: Payer: Self-pay | Admitting: *Deleted

## 2022-01-07 NOTE — Telephone Encounter (Signed)
-----   Message from Nils Flack sent at 01/05/2022 11:30 AM EDT ----- Hi Emely Fahy,  This patient called and said he is having some health issues and he doesn't think he can have his procedure done on 7/10 with Dr. Abbey Chatters. He would like for you to call him regarding this please.  Thank you,  Lorre Nick.

## 2022-01-07 NOTE — Telephone Encounter (Signed)
Called pt. He stated he has had a lot of health issues going on. He wants to discuss the colonoscopy with Dr. Abbey Chatters before he has this done. He was on for 7/10 and wants to cancel for now. He requested to schedule an OV also because he is having other symptoms as well. OV scheduled.

## 2022-01-07 NOTE — Patient Instructions (Signed)
Gabriel Koch  01/07/2022     '@PREFPERIOPPHARMACY'$ @   Your procedure is scheduled on  01/12/2022.   Report to [name] at  0700  A.M.   Call this number if you have problems the morning of surgery:  [phone]   Remember:  Follow the diet and prep instructions given to you by the office.    Take these medicines the morning of surgery with A SIP OF WATER           amlodipine, buspar, librium, protonix.     Do not wear jewelry, make-up or nail polish.  Do not wear lotions, powders, or perfumes, or deodorant.  Do not shave 48 hours prior to surgery.  Men may shave face and neck.  Do not bring valuables to the hospital.  [facility] is not responsible for any belongings or valuables.  Contacts, dentures or bridgework may not be worn into surgery.  Leave your suitcase in the car.  After surgery it may be brought to your room.  For patients admitted to the hospital, discharge time will be determined by your treatment team.  Patients discharged the day of surgery will not be allowed to drive home and must have someone with them for 24 hours.    Special instructions:   DO NOT smoke tobacco or vape for 24 hors before your procedure.  Please read over the following fact sheets that you were given. Anesthesia Post-op Instructions and Care and Recovery After Surgery      Colonoscopy, Adult, Care After The following information offers guidance on how to care for yourself after your procedure. Your health care provider may also give you more specific instructions. If you have problems or questions, contact your health care provider. What can I expect after the procedure? After the procedure, it is common to have: A small amount of blood in your stool for 24 hours after the procedure. Some gas. Mild cramping or bloating of your abdomen. Follow these instructions at home: Eating and drinking  Drink enough fluid to keep your urine pale yellow. Follow instructions  from your health care provider about eating or drinking restrictions. Resume your normal diet as told by your health care provider. Avoid heavy or fried foods that are hard to digest. Activity Rest as told by your health care provider. Avoid sitting for a long time without moving. Get up to take short walks every 1-2 hours. This is important to improve blood flow and breathing. Ask for help if you feel weak or unsteady. Return to your normal activities as told by your health care provider. Ask your health care provider what activities are safe for you. Managing cramping and bloating  Try walking around when you have cramps or feel bloated. If directed, apply heat to your abdomen as told by your health care provider. Use the heat source that your health care provider recommends, such as a moist heat pack or a heating pad. Place a towel between your skin and the heat source. Leave the heat on for 20-30 minutes. Remove the heat if your skin turns bright red. This is especially important if you are unable to feel pain, heat, or cold. You have a greater risk of getting burned. General instructions If you were given a sedative during the procedure, it can affect you for several hours. Do not drive or operate machinery until your health care provider says that it is safe. For the first 24 hours after the  procedure: Do not sign important documents. Do not drink alcohol. Do your regular daily activities at a slower pace than normal. Eat soft foods that are easy to digest. Take over-the-counter and prescription medicines only as told by your health care provider. Keep all follow-up visits. This is important. Contact a health care provider if: You have blood in your stool 2-3 days after the procedure. Get help right away if: You have more than a small spotting of blood in your stool. You have large blood clots in your stool. You have swelling of your abdomen. You have nausea or vomiting. You have a  fever. You have increasing pain in your abdomen that is not relieved with medicine. These symptoms may be an emergency. Get help right away. Call 911. Do not wait to see if the symptoms will go away. Do not drive yourself to the hospital. Summary After the procedure, it is common to have a small amount of blood in your stool. You may also have mild cramping and bloating of your abdomen. If you were given a sedative during the procedure, it can affect you for several hours. Do not drive or operate machinery until your health care provider says that it is safe. Get help right away if you have a lot of blood in your stool, nausea or vomiting, a fever, or increased pain in your abdomen. This information is not intended to replace advice given to you by your health care provider. Make sure you discuss any questions you have with your health care provider. Document Revised: 02/12/2021 Document Reviewed: 02/12/2021 Elsevier Patient Education  [place] After This sheet gives you information about how to care for yourself after your procedure. Your health care provider may also give you more specific instructions. If you have problems or questions, contact your health care provider. What can I expect after the procedure? After the procedure, it is common to have: Tiredness. Forgetfulness about what happened after the procedure. Impaired judgment for important decisions. Nausea or vomiting. Some difficulty with balance. Follow these instructions at home: For the time period you were told by your health care provider:     Rest as needed. Do not participate in activities where you could fall or become injured. Do not drive or use machinery. Do not drink alcohol. Do not take sleeping pills or medicines that cause drowsiness. Do not make important decisions or sign legal documents. Do not take care of children on your own. Eating and drinking Follow the  diet that is recommended by your health care provider. Drink enough fluid to keep your urine pale yellow. If you vomit: Drink water, juice, or soup when you can drink without vomiting. Make sure you have little or no nausea before eating solid foods. General instructions Have a responsible adult stay with you for the time you are told. It is important to have someone help care for you until you are awake and alert. Take over-the-counter and prescription medicines only as told by your health care provider. If you have sleep apnea, surgery and certain medicines can increase your risk for breathing problems. Follow instructions from your health care provider about wearing your sleep device: Anytime you are sleeping, including during daytime naps. While taking prescription pain medicines, sleeping medicines, or medicines that make you drowsy. Avoid smoking. Keep all follow-up visits as told by your health care provider. This is important. Contact a health care provider if: You keep feeling nauseous or you keep vomiting.  You feel light-headed. You are still sleepy or having trouble with balance after 24 hours. You develop a rash. You have a fever. You have redness or swelling around the IV site. Get help right away if: You have trouble breathing. You have new-onset confusion at home. Summary For several hours after your procedure, you may feel tired. You may also be forgetful and have poor judgment. Have a responsible adult stay with you for the time you are told. It is important to have someone help care for you until you are awake and alert. Rest as told. Do not drive or operate machinery. Do not drink alcohol or take sleeping pills. Get help right away if you have trouble breathing, or if you suddenly become confused. This information is not intended to replace advice given to you by your health care provider. Make sure you discuss any questions you have with your health care  provider. Document Revised: 05/27/2021 Document Reviewed: 05/25/2019 Elsevier Patient Education  [place].

## 2022-01-08 ENCOUNTER — Encounter (HOSPITAL_COMMUNITY): Payer: Self-pay

## 2022-01-08 ENCOUNTER — Inpatient Hospital Stay (HOSPITAL_COMMUNITY)
Admission: RE | Admit: 2022-01-08 | Discharge: 2022-01-08 | Disposition: A | Discharge status: Home / Self Care | Payer: Medicaid Other | Source: Ambulatory Visit | Attending: Internal Medicine | Admitting: Internal Medicine

## 2022-01-08 DIAGNOSIS — F1010 Alcohol abuse, uncomplicated: Secondary | ICD-10-CM

## 2022-01-08 DIAGNOSIS — D649 Anemia, unspecified: Secondary | ICD-10-CM

## 2022-01-12 ENCOUNTER — Ambulatory Visit (HOSPITAL_COMMUNITY): Admission: RE | Admit: 2022-01-12 | Payer: Medicaid Other | Source: Home / Self Care

## 2022-01-12 ENCOUNTER — Encounter (HOSPITAL_COMMUNITY): Admission: RE | Payer: Self-pay | Source: Home / Self Care

## 2022-01-12 SURGERY — COLONOSCOPY WITH PROPOFOL
Anesthesia: Monitor Anesthesia Care

## 2022-01-14 ENCOUNTER — Encounter: Payer: Self-pay | Admitting: Internal Medicine

## 2022-01-14 ENCOUNTER — Other Ambulatory Visit: Payer: Self-pay

## 2022-01-14 ENCOUNTER — Encounter (HOSPITAL_COMMUNITY): Payer: Self-pay | Admitting: *Deleted

## 2022-01-14 ENCOUNTER — Ambulatory Visit (INDEPENDENT_AMBULATORY_CARE_PROVIDER_SITE_OTHER): Payer: Medicaid Other | Admitting: Internal Medicine

## 2022-01-14 ENCOUNTER — Emergency Department (HOSPITAL_COMMUNITY): Payer: Medicaid Other

## 2022-01-14 ENCOUNTER — Emergency Department (HOSPITAL_COMMUNITY)
Admission: EM | Admit: 2022-01-14 | Discharge: 2022-01-14 | Disposition: A | Discharge status: Home / Self Care | Payer: Medicaid Other | Attending: Emergency Medicine | Admitting: Emergency Medicine

## 2022-01-14 VITALS — BP 96/57 | HR 80 | Temp 98.1°F | Ht 69.0 in | Wt 167.4 lb

## 2022-01-14 DIAGNOSIS — R112 Nausea with vomiting, unspecified: Secondary | ICD-10-CM | POA: Insufficient documentation

## 2022-01-14 DIAGNOSIS — R1012 Left upper quadrant pain: Secondary | ICD-10-CM | POA: Insufficient documentation

## 2022-01-14 DIAGNOSIS — I10 Essential (primary) hypertension: Secondary | ICD-10-CM | POA: Diagnosis not present

## 2022-01-14 DIAGNOSIS — K219 Gastro-esophageal reflux disease without esophagitis: Secondary | ICD-10-CM

## 2022-01-14 DIAGNOSIS — Z79899 Other long term (current) drug therapy: Secondary | ICD-10-CM | POA: Diagnosis not present

## 2022-01-14 DIAGNOSIS — R1900 Intra-abdominal and pelvic swelling, mass and lump, unspecified site: Secondary | ICD-10-CM

## 2022-01-14 DIAGNOSIS — Z7982 Long term (current) use of aspirin: Secondary | ICD-10-CM | POA: Insufficient documentation

## 2022-01-14 DIAGNOSIS — R062 Wheezing: Secondary | ICD-10-CM | POA: Insufficient documentation

## 2022-01-14 DIAGNOSIS — K2270 Barrett's esophagus without dysplasia: Secondary | ICD-10-CM

## 2022-01-14 DIAGNOSIS — R197 Diarrhea, unspecified: Secondary | ICD-10-CM | POA: Diagnosis not present

## 2022-01-14 DIAGNOSIS — Y906 Blood alcohol level of 120-199 mg/100 ml: Secondary | ICD-10-CM | POA: Insufficient documentation

## 2022-01-14 DIAGNOSIS — F1010 Alcohol abuse, uncomplicated: Secondary | ICD-10-CM | POA: Diagnosis not present

## 2022-01-14 DIAGNOSIS — R109 Unspecified abdominal pain: Secondary | ICD-10-CM | POA: Diagnosis present

## 2022-01-14 DIAGNOSIS — R509 Fever, unspecified: Secondary | ICD-10-CM | POA: Diagnosis not present

## 2022-01-14 DIAGNOSIS — R1032 Left lower quadrant pain: Secondary | ICD-10-CM

## 2022-01-14 LAB — URINALYSIS, ROUTINE W REFLEX MICROSCOPIC
Bilirubin Urine: NEGATIVE
Glucose, UA: NEGATIVE mg/dL
Hgb urine dipstick: NEGATIVE
Ketones, ur: NEGATIVE mg/dL
Leukocytes,Ua: NEGATIVE
Nitrite: NEGATIVE
Protein, ur: NEGATIVE mg/dL
Specific Gravity, Urine: 1.003 — ABNORMAL LOW (ref 1.005–1.030)
pH: 5 (ref 5.0–8.0)

## 2022-01-14 LAB — CBC
HCT: 32.6 % — ABNORMAL LOW (ref 39.0–52.0)
Hemoglobin: 10.8 g/dL — ABNORMAL LOW (ref 13.0–17.0)
MCH: 33 pg (ref 26.0–34.0)
MCHC: 33.1 g/dL (ref 30.0–36.0)
MCV: 99.7 fL (ref 80.0–100.0)
Platelets: 203 10*3/uL (ref 150–400)
RBC: 3.27 MIL/uL — ABNORMAL LOW (ref 4.22–5.81)
RDW: 16.9 % — ABNORMAL HIGH (ref 11.5–15.5)
WBC: 4.3 10*3/uL (ref 4.0–10.5)
nRBC: 0 % (ref 0.0–0.2)

## 2022-01-14 LAB — COMPREHENSIVE METABOLIC PANEL
ALT: 23 U/L (ref 0–44)
AST: 34 U/L (ref 15–41)
Albumin: 4 g/dL (ref 3.5–5.0)
Alkaline Phosphatase: 79 U/L (ref 38–126)
Anion gap: 11 (ref 5–15)
BUN: <5 mg/dL — ABNORMAL LOW (ref 6–20)
CO2: 21 mmol/L — ABNORMAL LOW (ref 22–32)
Calcium: 8.9 mg/dL (ref 8.9–10.3)
Chloride: 98 mmol/L (ref 98–111)
Creatinine, Ser: 0.98 mg/dL (ref 0.61–1.24)
GFR, Estimated: >60 mL/min (ref >60)
Glucose, Bld: 96 mg/dL (ref 70–99)
Potassium: 3.5 mmol/L (ref 3.5–5.1)
Sodium: 130 mmol/L — ABNORMAL LOW (ref 135–145)
Total Bilirubin: 0.8 mg/dL (ref 0.3–1.2)
Total Protein: 8.4 g/dL — ABNORMAL HIGH (ref 6.5–8.1)

## 2022-01-14 LAB — ETHANOL: Alcohol, Ethyl (B): 175 mg/dL — ABNORMAL HIGH (ref <10)

## 2022-01-14 LAB — TYPE AND SCREEN
ABO/RH(D): O NEG
Antibody Screen: NEGATIVE

## 2022-01-14 LAB — ACETAMINOPHEN LEVEL: Acetaminophen (Tylenol), Serum: 15 ug/mL (ref 10–30)

## 2022-01-14 LAB — LIPASE, BLOOD: Lipase: 59 U/L — ABNORMAL HIGH (ref 11–51)

## 2022-01-14 MED ORDER — POTASSIUM CHLORIDE CRYS ER 20 MEQ PO TBCR
40.0000 meq | EXTENDED_RELEASE_TABLET | Freq: Once | ORAL | Status: AC
  Administered 2022-01-14: 40 meq via ORAL
  Filled 2022-01-14: qty 2

## 2022-01-14 MED ORDER — METHYLPREDNISOLONE SODIUM SUCC 125 MG IJ SOLR
125.0000 mg | Freq: Once | INTRAMUSCULAR | Status: AC
  Administered 2022-01-14: 125 mg via INTRAVENOUS
  Filled 2022-01-14: qty 2

## 2022-01-14 MED ORDER — SODIUM CHLORIDE 0.9 % IV BOLUS
1000.0000 mL | Freq: Once | INTRAVENOUS | Status: AC
  Administered 2022-01-14: 1000 mL via INTRAVENOUS

## 2022-01-14 MED ORDER — IPRATROPIUM-ALBUTEROL 0.5-2.5 (3) MG/3ML IN SOLN
3.0000 mL | Freq: Once | RESPIRATORY_TRACT | Status: AC
  Administered 2022-01-14: 3 mL via RESPIRATORY_TRACT
  Filled 2022-01-14: qty 3

## 2022-01-14 MED ORDER — IOHEXOL 300 MG/ML  SOLN
100.0000 mL | Freq: Once | INTRAMUSCULAR | Status: AC | PRN
  Administered 2022-01-14: 100 mL via INTRAVENOUS

## 2022-01-14 NOTE — Patient Instructions (Signed)
I recommend you go to the ER for further evaluation.  I will call over there to let them know you are coming.

## 2022-01-14 NOTE — ED Notes (Signed)
Pt to desk, states he can not wait on papers because his ride is here. Pt strongly encouraged to f/u with gi tomorrow. Pt agreed.

## 2022-01-14 NOTE — ED Triage Notes (Signed)
Pt in for eval for loose stools >1 mth, pt states, "I have had bowel incontinence." Pt reports vomiting in the morning, pt states, "I was taking blood thinners because I had a heart attack, but I stopped that a while ago." Pt poor historian, pt drinks ETOH daily and reports last use this morning, A&O x4

## 2022-01-14 NOTE — ED Notes (Signed)
Pt taken to ct at this time. nad

## 2022-01-14 NOTE — ED Provider Notes (Signed)
[facility] EMERGENCY DEPARTMENT Provider Note   CSN: [id number] Arrival date & time: 01/14/22  1651     History  Chief Complaint  Patient presents with   Abdominal Pain    Gabriel Koch is a 56 y.o. male with medical history significant for hypertension, GERD, alcohol abuse, anxiety and depression.  The patient presents to ED for evaluation of loose stools x1 month, as well as left-sided abdominal pain, nausea and vomiting, fevers.  The patient was recently admitted to this facility on 6/11 after presenting for complaints of emesis, diarrhea, fevers.  During this time the patient was found to be hyponatremic with a sodium of 123 and was admitted for such.  During admission the patient was also found to have a problem list that included the following: Hyponatremia, discoloration of left flank, vomiting, coronary artery calcification, alcohol abuse, hypertension, alternating constipation and diarrhea.  At this time the patient's hyponatremia was thought to be due to the alcohol abuse along with GI losses and with vomiting and diarrhea.  Patient discoloration of the left flank was thought to be due to patient falling while drunk and on opiates, there was concern that the patient was receiving opiates from the street.  Patient alcohol abuse was addressed, patient admitted to drinking fireball cinnamon whiskey daily along with benzodiazepines for DT prevention.  The patient was discharged home on Librium, states that he has not been taking this.  Patient was supposed to resume Norvasc and losartan for hypertension.  Patient was set to follow-up with GI for his constipation and diarrhea.  On chart review, the patient followed up with Dr. Abbey Chatters of GI and was scheduled to have colonoscopy on 7/10 however the patient canceled this.  The patient presented to Dr. Ave Filter office today for evaluation of loose stools, nausea and vomiting along with left-sided abdominal pain.  The patient was sent to the ED  for further management.  The patient states that his symptoms today are continuation of the symptoms that brought him to the ED in June.  The patient reports that he never had full resolution of symptoms.  Patient also reporting blood in stools however states that he has history of hemorrhoids which have never been removed or addressed.  Patient reports extensive drinking history.  States that he began drinking at age 85 and has drinking every day since then.  The patient states that he has between 1 to 2 48 ounce beers per day.  Patient also states that he takes between 8 and 10 Tylenol tablets per day.   Abdominal Pain Associated symptoms: diarrhea, nausea and vomiting   Associated symptoms: no chest pain, no fever and no shortness of breath        Home Medications Prior to Admission medications   Medication Sig Start Date End Date Taking? Authorizing Provider  albuterol (VENTOLIN HFA) 108 (90 Base) MCG/ACT inhaler Inhale 1 puff into the lungs every 4 (four) hours as needed for wheezing or shortness of breath.   Yes [provider]  amLODipine (NORVASC) 10 MG tablet Take 1 tablet (10 mg total) by mouth daily. 12/17/21  Yes Roxan Hockey, MD  aspirin EC 81 MG tablet Take 1 tablet (81 mg total) by mouth daily with breakfast. 12/17/21 12/17/22 Yes Emokpae, Courage, MD  atorvastatin (LIPITOR) 20 MG tablet Take 1 tablet (20 mg total) by mouth every evening. Patient taking differently: Take 20 mg by mouth daily. 12/17/21  Yes Roxan Hockey, MD  buPROPion (WELLBUTRIN) 75 MG tablet Take  150 mg by mouth 2 (two) times daily.   Yes [provider]  busPIRone (BUSPAR) 10 MG tablet Take 1 tablet (10 mg total) by mouth 2 (two) times daily. 12/17/21  Yes Emokpae, Courage, MD  esomeprazole (NEXIUM) 40 MG capsule Take 40 mg by mouth daily at 12 noon.   Yes [provider]  folic acid (FOLVITE) 1 MG tablet Take 1 tablet (1 mg total) by mouth daily. 12/18/21  Yes Emokpae, Courage, MD   losartan (COZAAR) 50 MG tablet Take 1 tablet (50 mg total) by mouth daily. 12/17/21  Yes Emokpae, Courage, MD  meloxicam (MOBIC) 7.5 MG tablet Take 7.5 mg by mouth daily.   Yes [provider]  nicotine (NICODERM CQ - DOSED IN MG/24 HOURS) 14 mg/24hr patch Place 1 patch (14 mg total) onto the skin daily. 12/18/21  Yes Emokpae, Courage, MD  ondansetron (ZOFRAN) 4 MG tablet Take 4 mg by mouth every 8 (eight) hours as needed for nausea or vomiting.   Yes [provider]  oxyCODONE-acetaminophen (PERCOCET/ROXICET) 5-325 MG tablet Take 1 tablet by mouth every 12 (twelve) hours as needed for severe pain.   Yes [provider]  pantoprazole (PROTONIX) 40 MG tablet Take 1 tablet (40 mg total) by mouth daily. 12/17/21  Yes Emokpae, Courage, MD  thiamine 100 MG tablet Take 1 tablet (100 mg total) by mouth daily. 12/18/21  Yes Emokpae, Courage, MD  tiotropium (SPIRIVA) 18 MCG inhalation capsule Place 18 mcg into inhaler and inhale daily.   Yes [provider]  traZODone (DESYREL) 100 MG tablet Take 1 tablet (100 mg total) by mouth at bedtime. 12/17/21  Yes Emokpae, Courage, MD  zolpidem (AMBIEN) 5 MG tablet Take 5 mg by mouth at bedtime as needed for sleep.   Yes [provider]  chlordiazePOXIDE (LIBRIUM) 25 MG capsule Take 1 capsule (25 mg total) by mouth See admin instructions. Take 1 cap twice daily x 2 days , then 1 tablet daily for 2 days Patient not taking: Reported on 01/14/2022 12/17/21   Roxan Hockey, MD  Multiple Vitamin (MULTIVITAMIN WITH MINERALS) TABS tablet Take 1 tablet by mouth daily. Patient not taking: Reported on 01/14/2022 12/18/21   Roxan Hockey, MD      Allergies    Bee venom    Review of Systems   Review of Systems  Constitutional:  Negative for fever.  Respiratory:  Negative for shortness of breath.   Cardiovascular:  Negative for chest pain.  Gastrointestinal:  Positive for abdominal pain, blood in stool, diarrhea, nausea, rectal  pain and vomiting.  Neurological:  Negative for dizziness, syncope, weakness, light-headedness and numbness.  All other systems reviewed and are negative.   Physical Exam Updated Vital Signs BP 137/83   Pulse 91   Temp 97.9 F (36.6 C) (Oral)   Resp 19   SpO2 96%  Physical Exam Vitals and nursing note reviewed.  Constitutional:      General: He is not in acute distress.    Appearance: He is well-developed. He is not ill-appearing, toxic-appearing or diaphoretic.  HENT:     Head: Normocephalic and atraumatic.     Mouth/Throat:     Mouth: Mucous membranes are moist.     Pharynx: Oropharynx is clear.  Eyes:     Extraocular Movements: Extraocular movements intact.     Pupils: Pupils are equal, round, and reactive to light.  Cardiovascular:     Rate and Rhythm: Normal rate and regular rhythm.  Pulmonary:  Effort: Pulmonary effort is normal.     Breath sounds: Wheezing present.  Abdominal:     General: Abdomen is flat. Bowel sounds are normal. There is no distension.     Palpations: Abdomen is soft.     Tenderness: There is abdominal tenderness in the left upper quadrant and left lower quadrant.  Skin:    General: Skin is warm and dry.     Capillary Refill: Capillary refill takes less than 2 seconds.  Neurological:     Mental Status: He is alert and oriented to person, place, and time.     ED Results / Procedures / Treatments   Labs (all labs ordered are listed, but only abnormal results are displayed) Labs Reviewed  COMPREHENSIVE METABOLIC PANEL - Abnormal; Notable for the following components:      Result Value   Sodium 130 (*)    CO2 21 (*)    BUN <5 (*)    Total Protein 8.4 (*)    All other components within normal limits  CBC - Abnormal; Notable for the following components:   RBC 3.27 (*)    Hemoglobin 10.8 (*)    HCT 32.6 (*)    RDW 16.9 (*)    All other components within normal limits  ETHANOL - Abnormal; Notable for the following components:    Alcohol, Ethyl (B) 175 (*)    All other components within normal limits  LIPASE, BLOOD - Abnormal; Notable for the following components:   Lipase 59 (*)    All other components within normal limits  URINALYSIS, ROUTINE W REFLEX MICROSCOPIC - Abnormal; Notable for the following components:   Color, Urine STRAW (*)    Specific Gravity, Urine 1.003 (*)    All other components within normal limits  ACETAMINOPHEN LEVEL  POC OCCULT BLOOD, ED  TYPE AND SCREEN    EKG None  Radiology CT ABDOMEN PELVIS W CONTRAST  Result Date: 01/14/2022 CLINICAL DATA:  Left lower quadrant pain.  Vomiting.  Diarrhea. EXAM: CT ABDOMEN AND PELVIS WITH CONTRAST TECHNIQUE: Multidetector CT imaging of the abdomen and pelvis was performed using the standard protocol following bolus administration of intravenous contrast. RADIATION DOSE REDUCTION: This exam was performed according to the departmental dose-optimization program which includes automated exposure control, adjustment of the mA and/or kV according to patient size and/or use of iterative reconstruction technique. CONTRAST:  176m OMNIPAQUE IOHEXOL 300 MG/ML  SOLN COMPARISON:  12/14/2021 FINDINGS: Lower Chest: No acute findings. Hepatobiliary: No hepatic masses identified. Gallbladder is unremarkable. No evidence of biliary ductal dilatation. Pancreas:  No mass or inflammatory changes. Spleen: Within normal limits in size and appearance. Adrenals/Urinary Tract: No masses identified. No evidence of ureteral calculi or hydronephrosis. Stomach/Bowel: Small hiatal hernia noted. No evidence of obstruction, inflammatory process or abnormal fluid collections. Vascular/Lymphatic: No pathologically enlarged lymph nodes. No acute vascular findings. Aortic atherosclerotic calcification incidentally noted. Reproductive:  No mass or other significant abnormality. Other:  None. Musculoskeletal:  No suspicious bone lesions identified. IMPRESSION: No acute findings within the abdomen or  pelvis. Small hiatal hernia. Aortic Atherosclerosis (ICD10-I70.0). Electronically Signed   By: JMarlaine HindM.D.   On: 01/14/2022 19:45    Procedures Procedures    Medications Ordered in ED Medications  sodium chloride 0.9 % bolus 1,000 mL (0 mLs Intravenous Stopped 01/14/22 2225)  ipratropium-albuterol (DUONEB) 0.5-2.5 (3) MG/3ML nebulizer solution 3 mL (3 mLs Nebulization Given 01/14/22 1956)  methylPREDNISolone sodium succinate (SOLU-MEDROL) 125 mg/2 mL injection 125 mg (125 mg Intravenous Given 01/14/22  1904)  iohexol (OMNIPAQUE) 300 MG/ML solution 100 mL (100 mLs Intravenous Contrast Given 01/14/22 1937)  potassium chloride SA (KLOR-CON M) CR tablet 40 mEq (40 mEq Oral Given 01/14/22 2040)    ED Course/ Medical Decision Making/ A&P                           Medical Decision Making Amount and/or Complexity of Data Reviewed Labs: ordered. Radiology: ordered.  Risk Prescription drug management.   56 year old male presents to the ED for evaluation.  Please see HPI for further details.  On examination, the patient is afebrile and nontachycardic.  The patient lung sounds have diffuse wheezing bilaterally, he is not hypoxic on room air.  T the patient endorses diffuse left-sided abdominal pain and tenderness.  The patient neurologically has no focal neurodeficits.    Patient worked up utilizing the following labs and imaging studies interpreted by me personally: - Ethanol elevated at 175 - Lipase elevated at 59 - Acetaminophen level 15 - Urinalysis shows decreased [place] gravity - CBC shows decreased hemoglobin at 10.8 however on chart review this is actually improved from the patient baseline - CMP shows decreased sodium to 130 - CT abdomen pelvis with contrast shows no intra-abdominal abnormality to account for the patient's pain  Patient was provided with 1 L normal saline, 40 mEq potassium chloride due to the fact that the patient's potassium was 3.5.  Patient was also provided  with 125 Solu-Medrol, 1 round DuoNeb for wheezing.  When I went to discuss the patient's findings with him, he became irritated because I stated that we would most likely not be able to admit the patient.  The patient stated that he would "just get someone to come pick me up because y'all are not going to help me".  I advised the patient that I would like to continue giving him fluids due to his decreased sodium however he stated that he did not wish to stay for this.  Patient eloped at this time.  Final Clinical Impression(s) / ED Diagnoses Final diagnoses:  Left upper quadrant abdominal pain    Rx / DC Orders ED Discharge Orders     None         Azucena Cecil, PA-C 60/10/93 2355    Campbell Stall P, DO 73/22/02 1513

## 2022-01-14 NOTE — ED Notes (Signed)
Pt at desk dressed in clothes stating" I'm ready to go, they aint admitting me and my ride is on the way".advised waiting on dc papers

## 2022-01-14 NOTE — ED Notes (Signed)
Pt resting, NAD. Good spirits.no resp distress or sob noted.

## 2022-01-15 ENCOUNTER — Telehealth: Payer: Self-pay

## 2022-01-15 NOTE — Telephone Encounter (Signed)
Pt LMOVM for you to give him a call he wanted to talk to you about his ED visit last night. He states he needs to talk to you today. Please advise

## 2022-01-27 ENCOUNTER — Telehealth: Payer: Self-pay | Admitting: Internal Medicine

## 2022-01-27 NOTE — Telephone Encounter (Signed)
Patient called back wanting to speak to the nurse

## 2022-01-28 NOTE — Telephone Encounter (Signed)
Returned the pt's call and LMOVM. 

## 2022-02-02 NOTE — Telephone Encounter (Signed)
Phoned and LMOVM for the pt to return call 

## 2022-02-05 ENCOUNTER — Encounter: Payer: Self-pay | Admitting: *Deleted

## 2022-02-09 ENCOUNTER — Other Ambulatory Visit: Payer: Self-pay

## 2022-02-09 ENCOUNTER — Emergency Department (HOSPITAL_COMMUNITY): Payer: Medicaid Other

## 2022-02-09 ENCOUNTER — Encounter (HOSPITAL_COMMUNITY): Payer: Self-pay

## 2022-02-09 ENCOUNTER — Emergency Department (HOSPITAL_COMMUNITY)
Admission: EM | Admit: 2022-02-09 | Discharge: 2022-02-09 | Disposition: A | Discharge status: Home / Self Care | Payer: Medicaid Other | Attending: Emergency Medicine | Admitting: Emergency Medicine

## 2022-02-09 DIAGNOSIS — D539 Nutritional anemia, unspecified: Secondary | ICD-10-CM | POA: Diagnosis not present

## 2022-02-09 DIAGNOSIS — Z79899 Other long term (current) drug therapy: Secondary | ICD-10-CM | POA: Insufficient documentation

## 2022-02-09 DIAGNOSIS — Z7982 Long term (current) use of aspirin: Secondary | ICD-10-CM | POA: Insufficient documentation

## 2022-02-09 DIAGNOSIS — E871 Hypo-osmolality and hyponatremia: Secondary | ICD-10-CM | POA: Diagnosis not present

## 2022-02-09 DIAGNOSIS — R799 Abnormal finding of blood chemistry, unspecified: Secondary | ICD-10-CM | POA: Diagnosis present

## 2022-02-09 DIAGNOSIS — R1012 Left upper quadrant pain: Secondary | ICD-10-CM | POA: Insufficient documentation

## 2022-02-09 DIAGNOSIS — I10 Essential (primary) hypertension: Secondary | ICD-10-CM | POA: Diagnosis not present

## 2022-02-09 DIAGNOSIS — Z789 Other specified health status: Secondary | ICD-10-CM | POA: Insufficient documentation

## 2022-02-09 LAB — COMPREHENSIVE METABOLIC PANEL
ALT: 50 U/L — ABNORMAL HIGH (ref 0–44)
AST: 51 U/L — ABNORMAL HIGH (ref 15–41)
Albumin: 3.9 g/dL (ref 3.5–5.0)
Alkaline Phosphatase: 104 U/L (ref 38–126)
Anion gap: 7 (ref 5–15)
BUN: 7 mg/dL (ref 6–20)
CO2: 21 mmol/L — ABNORMAL LOW (ref 22–32)
Calcium: 8.8 mg/dL — ABNORMAL LOW (ref 8.9–10.3)
Chloride: 101 mmol/L (ref 98–111)
Creatinine, Ser: 0.93 mg/dL (ref 0.61–1.24)
GFR, Estimated: >60 mL/min (ref >60)
Glucose, Bld: 79 mg/dL (ref 70–99)
Potassium: 3.6 mmol/L (ref 3.5–5.1)
Sodium: 129 mmol/L — ABNORMAL LOW (ref 135–145)
Total Bilirubin: 0.8 mg/dL (ref 0.3–1.2)
Total Protein: 7.6 g/dL (ref 6.5–8.1)

## 2022-02-09 LAB — CBC
HCT: 30.7 % — ABNORMAL LOW (ref 39.0–52.0)
Hemoglobin: 10.7 g/dL — ABNORMAL LOW (ref 13.0–17.0)
MCH: 35.9 pg — ABNORMAL HIGH (ref 26.0–34.0)
MCHC: 34.9 g/dL (ref 30.0–36.0)
MCV: 103 fL — ABNORMAL HIGH (ref 80.0–100.0)
Platelets: 131 10*3/uL — ABNORMAL LOW (ref 150–400)
RBC: 2.98 MIL/uL — ABNORMAL LOW (ref 4.22–5.81)
RDW: 17.1 % — ABNORMAL HIGH (ref 11.5–15.5)
WBC: 3.2 10*3/uL — ABNORMAL LOW (ref 4.0–10.5)
nRBC: 0 % (ref 0.0–0.2)

## 2022-02-09 LAB — ETHANOL: Alcohol, Ethyl (B): 130 mg/dL — ABNORMAL HIGH (ref <10)

## 2022-02-09 LAB — URINALYSIS, ROUTINE W REFLEX MICROSCOPIC
Bilirubin Urine: NEGATIVE
Glucose, UA: NEGATIVE mg/dL
Hgb urine dipstick: NEGATIVE
Ketones, ur: NEGATIVE mg/dL
Leukocytes,Ua: NEGATIVE
Nitrite: NEGATIVE
Protein, ur: NEGATIVE mg/dL
Specific Gravity, Urine: 1.006 (ref 1.005–1.030)
pH: 5 (ref 5.0–8.0)

## 2022-02-09 LAB — APTT: aPTT: 26 seconds (ref 24–36)

## 2022-02-09 LAB — POC OCCULT BLOOD, ED: Fecal Occult Bld: NEGATIVE

## 2022-02-09 LAB — PROTIME-INR
INR: 1.1 (ref 0.8–1.2)
Prothrombin Time: 14.3 seconds (ref 11.4–15.2)

## 2022-02-09 LAB — TROPONIN I (HIGH SENSITIVITY)
Troponin I (High Sensitivity): 5 ng/L (ref <18)
Troponin I (High Sensitivity): 5 ng/L (ref <18)

## 2022-02-09 LAB — ACETAMINOPHEN LEVEL: Acetaminophen (Tylenol), Serum: 15 ug/mL (ref 10–30)

## 2022-02-09 LAB — LIPASE, BLOOD: Lipase: 56 U/L — ABNORMAL HIGH (ref 11–51)

## 2022-02-09 MED ORDER — MORPHINE SULFATE (PF) 4 MG/ML IV SOLN
4.0000 mg | Freq: Once | INTRAVENOUS | Status: AC
  Administered 2022-02-09: 4 mg via INTRAVENOUS
  Filled 2022-02-09: qty 1

## 2022-02-09 MED ORDER — IOHEXOL 300 MG/ML  SOLN
100.0000 mL | Freq: Once | INTRAMUSCULAR | Status: AC | PRN
  Administered 2022-02-09: 100 mL via INTRAVENOUS

## 2022-02-09 MED ORDER — METOCLOPRAMIDE HCL 5 MG/ML IJ SOLN
10.0000 mg | Freq: Once | INTRAMUSCULAR | Status: AC
  Administered 2022-02-09: 10 mg via INTRAVENOUS
  Filled 2022-02-09: qty 2

## 2022-02-09 NOTE — ED Provider Triage Note (Signed)
Emergency Medicine Provider Triage Evaluation Note  Gabriel Koch , a 56 y.o. male  was evaluated in triage.  Pt complains of sent over from PCP due to a "low iron level".  Patient reports that has been having some significant abdominal pain on the left.  He takes 8-12 Tylenol per day and drinks beer every day.  History of hypertension, alcohol abuse, anxiety, depression.  Patient is somewhat intoxicated appearing in triage.  He reports that he has been having bloody stool on and off for over a month as well as abdominal pain for over a month.  Review of Systems  Positive: Abdominal pain, bloody stool, loose stool, alcohol abuse Negative: Fever, chills  Physical Exam  BP (!) 140/77 (BP Location: Right Arm)   Pulse 95   Temp 97.9 F (36.6 C) (Oral)   Resp 20   Ht '5\' 9"'$  (1.753 m)   Wt 75 kg   SpO2 97%   BMI 24.41 kg/m  Gen:   Awake, no distress   Resp:  Normal effort  MSK:   Moves extremities without difficulty  Other:  Significant tenderness palpation throughout abdomen, worse in the left lower quadrant.  Some distention noted on my exam.  Medical Decision Making  Medically screening exam initiated at 2:29 PM.  Appropriate orders placed.  Theodis Aguas was informed that the remainder of the evaluation will be completed by another provider, this initial triage assessment does not replace that evaluation, and the importance of remaining in the ED until their evaluation is complete.  Workup initiated   Anselmo Pickler, [place] 02/09/22 1429

## 2022-02-09 NOTE — Telephone Encounter (Signed)
Letter mailed to the pt to contact us.

## 2022-02-09 NOTE — ED Notes (Signed)
Informed patient he will be getting dc soon.  Monitor removed. IV still in place until paper work is finished.

## 2022-02-09 NOTE — ED Triage Notes (Signed)
Pt reports bloody stools x1 month. Went to see PCP, was told iron level low and he should come to ER. Pt reports abdomen pain on the left.  Pt takes 8-12 tylenol and drink beer everyday.

## 2022-02-09 NOTE — Discharge Instructions (Addendum)
Your lab test today are stable, you do not have blood in your GI tract today, however your hemoglobin is low at 10.7, which is actually improved from a month ago but still low.  Please follow-up with Dr. Abbey Chatters for further management of your symptoms.  There is no indication for need for admission today or for a blood transfusion.  However return here for recheck if you have any return or worsening of your symptoms.

## 2022-02-14 NOTE — ED Provider Notes (Signed)
[facility] Provider Note   CSN: [id number] Arrival date & time: 02/09/22  1359     History  Chief Complaint  Patient presents with   Abnormal Lab    Iron level low    Gabriel Koch is a 56 y.o. male with a history of HTN, anxiety/depression, daily etoh use presenting for evaluation of blood in his stool which he has had off and on for the past 2 months now in association with left sided abdominal pain, also intermittent.  He was seen for this here on 7/12 at which time he had cancelled a scheduled colonoscopy with Dr. Abbey Chatters for unclear reasons.  Regardless, he states his symptoms have never completely resolved since that last ED visit.  He does report having a history of hemorrhoids which have never been treated.  He denies constipation, rather having looser than normal stools usually, denies rectal pain with defecation.  He also denies weakness, lightheadedness, fevers, chills, n/v.  He drinks etoh daily, also takes 8-10 tylenol tablets daily for this chronic pain.  He was told by pcp that his iron level was low and he needed to come to the emergency department today.  He has been drinking etoh prior to arrival.   The history is provided by the patient.       Home Medications Prior to Admission medications   Medication Sig Start Date End Date Taking? Authorizing Provider  acetaminophen (TYLENOL) 500 MG tablet Take 2,000 mg by mouth every 6 (six) hours as needed.   Yes [provider]  albuterol (VENTOLIN HFA) 108 (90 Base) MCG/ACT inhaler Inhale 1 puff into the lungs every 4 (four) hours as needed for wheezing or shortness of breath.   Yes [provider]  amLODipine (NORVASC) 10 MG tablet Take 1 tablet (10 mg total) by mouth daily. 12/17/21  Yes Roxan Hockey, MD  aspirin EC 81 MG tablet Take 1 tablet (81 mg total) by mouth daily with breakfast. 12/17/21 12/17/22 Yes Emokpae, Courage, MD  atorvastatin (LIPITOR) 20 MG tablet Take 1 tablet (20  mg total) by mouth every evening. Patient taking differently: Take 20 mg by mouth daily. 12/17/21  Yes Emokpae, Courage, MD  busPIRone (BUSPAR) 10 MG tablet Take 1 tablet (10 mg total) by mouth 2 (two) times daily. 12/17/21  Yes Emokpae, Courage, MD  esomeprazole (NEXIUM) 40 MG capsule Take 40 mg by mouth daily at 12 noon.   Yes [provider]  folic acid (FOLVITE) 1 MG tablet Take 1 tablet (1 mg total) by mouth daily. 12/18/21  Yes Emokpae, Courage, MD  losartan (COZAAR) 50 MG tablet Take 1 tablet (50 mg total) by mouth daily. 12/17/21  Yes Emokpae, Courage, MD  meloxicam (MOBIC) 7.5 MG tablet Take 7.5 mg by mouth daily.   Yes [provider]  ondansetron (ZOFRAN) 4 MG tablet Take 4 mg by mouth every 8 (eight) hours as needed for nausea or vomiting.   Yes [provider]  tiotropium (SPIRIVA) 18 MCG inhalation capsule Place 18 mcg into inhaler and inhale daily.   Yes [provider]  zolpidem (AMBIEN) 5 MG tablet Take 5 mg by mouth at bedtime as needed for sleep.   Yes [provider]  chlordiazePOXIDE (LIBRIUM) 25 MG capsule Take 1 capsule (25 mg total) by mouth See admin instructions. Take 1 cap twice daily x 2 days , then 1 tablet daily for 2 days Patient not taking: Reported on 01/14/2022 12/17/21   Roxan Hockey, MD  Multiple Vitamin (MULTIVITAMIN WITH MINERALS) TABS tablet Take 1 tablet by mouth daily. Patient not taking: Reported on 01/14/2022 12/18/21   Roxan Hockey, MD  nicotine (NICODERM CQ - DOSED IN MG/24 HOURS) 14 mg/24hr patch Place 1 patch (14 mg total) onto the skin daily. Patient not taking: Reported on 02/09/2022 12/18/21   Roxan Hockey, MD  oxyCODONE-acetaminophen (PERCOCET/ROXICET) 5-325 MG tablet Take 1 tablet by mouth every 12 (twelve) hours as needed for severe pain. Patient not taking: Reported on 02/09/2022    [provider]  pantoprazole (PROTONIX) 40 MG tablet Take 1 tablet (40 mg total) by mouth daily. 12/17/21    Roxan Hockey, MD  thiamine 100 MG tablet Take 1 tablet (100 mg total) by mouth daily. Patient not taking: Reported on 02/09/2022 12/18/21   Roxan Hockey, MD  traZODone (DESYREL) 100 MG tablet Take 1 tablet (100 mg total) by mouth at bedtime. Patient not taking: Reported on 02/09/2022 12/17/21   Roxan Hockey, MD      Allergies    Bee venom    Review of Systems   Review of Systems  Constitutional:  Negative for chills, fatigue and fever.  HENT:  Negative for congestion and sore throat.   Eyes: Negative.   Respiratory:  Negative for chest tightness and shortness of breath.   Cardiovascular:  Negative for chest pain.  Gastrointestinal:  Positive for abdominal pain and blood in stool. Negative for constipation, nausea, rectal pain and vomiting.  Genitourinary: Negative.   Musculoskeletal:  Negative for arthralgias, joint swelling and neck pain.  Skin: Negative.  Negative for rash and wound.  Neurological:  Negative for dizziness, weakness, light-headedness, numbness and headaches.  Psychiatric/Behavioral: Negative.      Physical Exam Updated Vital Signs BP 133/88   Pulse 85   Temp 97.7 F (36.5 C)   Resp 20   Ht '5\' 9"'$  (1.753 m)   Wt 75 kg   SpO2 99%   BMI 24.41 kg/m  Physical Exam Vitals and nursing note reviewed. Exam conducted with a chaperone present.  Constitutional:      Appearance: He is well-developed.     Comments: Is clearly intoxicated, but awake, ambulatory, appears functional.   HENT:     Head: Normocephalic and atraumatic.  Eyes:     Conjunctiva/sclera: Conjunctivae normal.  Cardiovascular:     Rate and Rhythm: Normal rate and regular rhythm.     Heart sounds: Normal heart sounds.  Pulmonary:     Effort: Pulmonary effort is normal.     Breath sounds: Normal breath sounds. No wheezing.  Abdominal:     General: Bowel sounds are normal. There is no distension.     Palpations: Abdomen is soft.     Tenderness: There is abdominal tenderness. There is no  guarding or rebound.     Comments: LUQ abdominal pain, no guarding or rebound.  Genitourinary:    Rectum: Guaiac result negative. No anal fissure or external hemorrhoid.  Musculoskeletal:        General: Normal range of motion.     Cervical back: Normal range of motion.  Skin:    General: Skin is warm and dry.  Neurological:     Mental Status: He is alert.     ED Results / Procedures / Treatments   Labs (all labs ordered are listed, but only abnormal results are displayed) Labs Reviewed  LIPASE, BLOOD - Abnormal; Notable for the following components:      Result Value   Lipase 56 (*)  All other components within normal limits  COMPREHENSIVE METABOLIC PANEL - Abnormal; Notable for the following components:   Sodium 129 (*)    CO2 21 (*)    Calcium 8.8 (*)    AST 51 (*)    ALT 50 (*)    All other components within normal limits  CBC - Abnormal; Notable for the following components:   WBC 3.2 (*)    RBC 2.98 (*)    Hemoglobin 10.7 (*)    HCT 30.7 (*)    MCV 103.0 (*)    MCH 35.9 (*)    RDW 17.1 (*)    Platelets 131 (*)    All other components within normal limits  ETHANOL - Abnormal; Notable for the following components:   Alcohol, Ethyl (B) 130 (*)    All other components within normal limits  URINALYSIS, ROUTINE W REFLEX MICROSCOPIC  PROTIME-INR  APTT  ACETAMINOPHEN LEVEL  POC OCCULT BLOOD, ED  TROPONIN I (HIGH SENSITIVITY)  TROPONIN I (HIGH SENSITIVITY)    EKG EKG Interpretation  Date/Time:  Monday February 09 2022 16:35:36 EDT Ventricular Rate:  87 PR Interval:  218 QRS Duration: 129 QT Interval:  373 QTC Calculation: 449 R Axis:   79 Text Interpretation: Sinus rhythm Prolonged PR interval Right bundle branch block Minimal ST elevation, inferior leads No significant change since last tracing Confirmed by Isla Pence [id number]) on 02/10/2022 3:41:25 PM  Radiology No results found.  Procedures Procedures    Medications Ordered in ED Medications   morphine (PF) 4 MG/ML injection 4 mg (4 mg Intravenous Given 02/09/22 1750)  metoCLOPramide (REGLAN) injection 10 mg (10 mg Intravenous Given 02/09/22 1750)  iohexol (OMNIPAQUE) 300 MG/ML solution 100 mL (100 mLs Intravenous Contrast Given 02/09/22 1756)    ED Course/ Medical Decision Making/ A&P                           Medical Decision Making Pt with a 2 month history of abdominal pain and bloody stools based on prior chart, admitted in June, re-evaluated for same complaint on 7/12 here at which time his work up was stable including CT abd/pelvis with no acute intra abdominal findings.  Suspect he may have some smoldering pancreatitis given distribution of pain and lab abnormalities.  His hgb is stable in comparison to prior measurements, no indication for transfusion or admission at this time.  He is hemoccult negative currently.  He was strongly advised to f/u with Dr. Abbey Chatters as he needs to have the colonoscopy he cancelled last month.  Pt understands.  Was also given strict return precautions to return here if he sees increased rectal bleeding, develops weakness or other new sx.   Amount and/or Complexity of Data Reviewed External Data Reviewed: labs, radiology and notes.    Details: labs, notes and CT imagiing from ed visit 7/12 reviewed. Labs: ordered.    Details: Na+ low at 129, down from 130 on 7/12,  he was given NS 1 L while here which should help increase this, AST, ALT and lipase all slightly elevated but stable.  hgb 10.7 today which is stable in comparision to prior recent hgb levels. Delta trops negative r/o ACS (atypical LUG pain). ECG/medicine tests: independent interpretation performed.    Details: no sig changes  Risk Decision regarding hospitalization. Diagnosis or treatment significantly limited by social determinants of health.           Final Clinical Impression(s) / ED Diagnoses Final  diagnoses:  Macrocytic anemia  Alcohol use    Rx / DC Orders ED  Discharge Orders     None         Landis Martins 02/14/22 [place], Ankit, MD 02/14/22 2022

## 2022-03-03 DIAGNOSIS — K2270 Barrett's esophagus without dysplasia: Secondary | ICD-10-CM | POA: Insufficient documentation

## 2022-03-03 NOTE — Progress Notes (Signed)
Referring Provider: Abran Richard, MD Primary Care Physician:  Redmond School, MD Primary GI:  Dr. Abbey Chatters  Chief Complaint  Patient presents with   Abdominal Pain    Patient here today following up on abdominal pain. He has a picture of his left side abdomin bruised up. He says people tell him he fell,but he says he did not fall. He says he has bruises on his right leg. He says he is vomiting yellow and white phlegm, he has occasional diarrhea.    HPI:   Gabriel Koch is a 56 y.o. male who presents to the clinic today for follow-up visit.  History of chronic GERD and Barrett's esophagus.  Previously underwent EGD for chronic reflux on 04/02/2020.  Findings consistent with Barrett's esophagus with esophageal biopsy showing intestinal metaplasia without dysplasia.  Stomach biopsies were negative for H. pylori.  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  Repeat EGD 03/21/2021 with polypectomy, path consistent with tubular adenoma, no high-grade dysplasia.  Multiple complaints for me today.  Continues to have intermittent abdominal pain, mild to moderate in severity.  Epigastric/right upper quadrant.  States he drinks beer to help with his pain.  With associated bloating as well  Still with chronic diarrhea.  States he will have to run to the bathroom at times.  Notes multiple loose stools daily.  Given samples of pancreatic enzymes on previous visit.  States this did not help, though I am unsure how compliant he was with these.  Last colonoscopy 04/02/2020 with 6-7 tubular adenomas removed, 3-4 which were greater than 10 mm.  Recommended 1 year recall.  Is very concerned about his abdominal swelling.  Notes his abdomen is quite large and distended.  Past Medical History:  Diagnosis Date   Alcohol abuse    Anxiety and depression    Blood transfusion    Complication of anesthesia    patient hemorrrhaged about 2 weeks after  tonsillecotmy   GERD (gastroesophageal reflux disease)    Hypertension     Past Surgical History:  Procedure Laterality Date   APPENDECTOMY     BIOPSY  04/02/2020   Procedure: BIOPSY;  Surgeon: Eloise Harman, DO;  Location: [facility];  Service: Endoscopy;;   COLONOSCOPY WITH PROPOFOL N/A 04/02/2020   Procedure: COLONOSCOPY WITH PROPOFOL;  Surgeon: Eloise Harman, DO;  Location: [facility];  Service: Endoscopy;  Laterality: N/A;  8:30am   ESOPHAGOGASTRODUODENOSCOPY (EGD) WITH PROPOFOL N/A 04/02/2020   Procedure: ESOPHAGOGASTRODUODENOSCOPY (EGD) WITH PROPOFOL;  Surgeon: Eloise Harman, DO;  Location: [facility];  Service: Endoscopy;  Laterality: N/A;   ESOPHAGOGASTRODUODENOSCOPY (EGD) WITH PROPOFOL N/A 03/21/2021   Procedure: ESOPHAGOGASTRODUODENOSCOPY (EGD) WITH PROPOFOL;  Surgeon: Eloise Harman, DO;  Location: [facility];  Service: Endoscopy;  Laterality: N/A;  11:00am   POLYPECTOMY  04/02/2020   Procedure: POLYPECTOMY;  Surgeon: Eloise Harman, DO;  Location: [facility];  Service: Endoscopy;;   POLYPECTOMY  03/21/2021   Procedure: POLYPECTOMY;  Surgeon: Eloise Harman, DO;  Location: [facility];  Service: Endoscopy;;  duodenal   TONSILLECTOMY      Current Outpatient Medications  Medication Sig Dispense Refill   albuterol (VENTOLIN HFA) 108 (90 Base) MCG/ACT inhaler Inhale 1 puff into the lungs every 4 (four) hours as needed for wheezing or shortness of breath.     amLODipine (NORVASC) 10 MG tablet Take 1 tablet (10 mg total) by mouth  daily. 90 tablet 2   aspirin EC 81 MG tablet Take 1 tablet (81 mg total) by mouth daily with breakfast. 30 tablet 2   atorvastatin (LIPITOR) 20 MG tablet Take 1 tablet (20 mg total) by mouth every evening. (Patient taking differently: Take 20 mg by mouth daily.) 90 tablet 2   busPIRone (BUSPAR) 10 MG tablet Take 1 tablet (10 mg total) by mouth 2 (two) times daily. 180 tablet 3   chlordiazePOXIDE (LIBRIUM) 25 MG capsule Take  1 capsule (25 mg total) by mouth See admin instructions. Take 1 cap twice daily x 2 days , then 1 tablet daily for 2 days (Patient not taking: Reported on 01/14/2022) 6 capsule 0   esomeprazole (NEXIUM) 40 MG capsule Take 40 mg by mouth daily at 12 noon.     folic acid (FOLVITE) 1 MG tablet Take 1 tablet (1 mg total) by mouth daily. 90 tablet 3   losartan (COZAAR) 50 MG tablet Take 1 tablet (50 mg total) by mouth daily. 90 tablet 3   meloxicam (MOBIC) 7.5 MG tablet Take 7.5 mg by mouth daily.     Multiple Vitamin (MULTIVITAMIN WITH MINERALS) TABS tablet Take 1 tablet by mouth daily. (Patient not taking: Reported on 01/14/2022) 90 tablet 3   nicotine (NICODERM CQ - DOSED IN MG/24 HOURS) 14 mg/24hr patch Place 1 patch (14 mg total) onto the skin daily. (Patient not taking: Reported on 02/09/2022) 28 patch 0   ondansetron (ZOFRAN) 4 MG tablet Take 4 mg by mouth every 8 (eight) hours as needed for nausea or vomiting.     pantoprazole (PROTONIX) 40 MG tablet Take 1 tablet (40 mg total) by mouth daily. 90 tablet 3   thiamine 100 MG tablet Take 1 tablet (100 mg total) by mouth daily. (Patient not taking: Reported on 02/09/2022) 90 tablet 2   tiotropium (SPIRIVA) 18 MCG inhalation capsule Place 18 mcg into inhaler and inhale daily.     traZODone (DESYREL) 100 MG tablet Take 1 tablet (100 mg total) by mouth at bedtime. (Patient not taking: Reported on 02/09/2022) 90 tablet 2   zolpidem (AMBIEN) 5 MG tablet Take 5 mg by mouth at bedtime as needed for sleep.     acetaminophen (TYLENOL) 500 MG tablet Take 2,000 mg by mouth every 6 (six) hours as needed.     oxyCODONE-acetaminophen (PERCOCET/ROXICET) 5-325 MG tablet Take 1 tablet by mouth every 12 (twelve) hours as needed for severe pain. (Patient not taking: Reported on 02/09/2022)     No current facility-administered medications for this visit.    Allergies as of 01/14/2022 - Review Complete 01/14/2022  Allergen Reaction Noted   Bee venom Anaphylaxis 09/09/2011     Family History  Problem Relation Age of Onset   Colon cancer Neg Hx        Pt knows little about family history    Social History   Socioeconomic History   Marital status: Legally Separated    Spouse name: Not on file   Number of children: Not on file   Years of education: Not on file   Highest education level: Not on file  Occupational History   Not on file  Tobacco Use   Smoking status: Every Day    Packs/day: 1.50    Years: 30.00    Total pack years: 45.00    Types: Cigars, Cigarettes   Smokeless tobacco: Never  Vaping Use   Vaping Use: Never used  Substance and Sexual Activity   Alcohol use: Yes  Alcohol/week: 6.0 standard drinks of alcohol    Types: 6 Cans of beer per week    Comment: 2-40oz beers per day and liquor   Drug use: No   Sexual activity: Not Currently  Other Topics Concern   Not on file  Social History Narrative   Right handed   Social Determinants of Health   Financial Resource Strain: Not on file  Food Insecurity: Not on file  Transportation Needs: Not on file  Physical Activity: Not on file  Stress: Not on file  Social Connections: Not on file    Subjective: Review of Systems  Constitutional:  Negative for chills and fever.  HENT:  Negative for congestion and hearing loss.   Eyes:  Negative for blurred vision and double vision.  Respiratory:  Negative for cough and shortness of breath.   Cardiovascular:  Negative for chest pain and palpitations.  Gastrointestinal:  Positive for abdominal pain, diarrhea and heartburn. Negative for blood in stool, constipation, melena and vomiting.  Genitourinary:  Negative for dysuria and urgency.  Musculoskeletal:  Negative for joint pain and myalgias.  Skin:  Negative for itching and rash.  Neurological:  Negative for dizziness and headaches.  Psychiatric/Behavioral:  Negative for depression. The patient is not nervous/anxious.      Objective: BP (!) 96/57 (BP Location: Left Arm, Patient  Position: Sitting, Cuff Size: Small)   Pulse 80   Temp 98.1 F (36.7 C) (Oral)   Ht '5\' 9"'$  (1.753 m)   Wt 167 lb 6.4 oz (75.9 kg)   BMI 24.72 kg/m  Physical Exam Constitutional:      Appearance: Normal appearance.  HENT:     Head: Normocephalic and atraumatic.  Eyes:     Extraocular Movements: Extraocular movements intact.     Conjunctiva/sclera: Conjunctivae normal.  Cardiovascular:     Rate and Rhythm: Normal rate and regular rhythm.  Pulmonary:     Effort: Pulmonary effort is normal.     Breath sounds: Normal breath sounds.  Abdominal:     General: Bowel sounds are normal.     Palpations: Abdomen is soft.  Musculoskeletal:        General: Normal range of motion.     Cervical back: Normal range of motion and neck supple.  Skin:    General: Skin is warm.  Neurological:     General: No focal deficit present.     Mental Status: He is alert and oriented to person, place, and time.  Psychiatric:        Mood and Affect: Mood normal.        Behavior: Behavior normal.      Assessment: *GERD-well-controlled on Nexium twice daily *Barrett's esophagus *Right-sided abdominal pain with swelling *Diarrhea with urgency *Abdominal bloating *Adenomatous colon polyps  Plan: GERD well-controlled on Nexium twice daily.  We will continue.  Repeat EGD 2025 for Barrett's surveillance.  Etiology of patient's chronic diarrhea and right side abdominal pain likely irritable bowel syndrome.  Possible pancreatic insufficiency in the setting of chronic alcohol abuse.  Previously given pancreatic enzyme samples which she states did not help.  Daily beer use also probably contributing.  In regards to his  worsening abdominal swelling, ongoing alcohol abuse, worsening abdominal pain, I recommended he go to the ER for further evaluation.  He is agreeable.  Needs to have colonoscopy rescheduled given his history of numerous adenomatous colon polyps prior.  Did cancel this  previously.      03/03/2022 11:20 AM   Disclaimer: This  note was dictated with voice recognition software. Similar sounding words can inadvertently be transcribed and may not be corrected upon review.

## 2022-03-04 ENCOUNTER — Encounter: Payer: Self-pay | Admitting: Internal Medicine

## 2022-03-04 ENCOUNTER — Encounter: Payer: Self-pay | Admitting: *Deleted

## 2022-03-04 ENCOUNTER — Ambulatory Visit (INDEPENDENT_AMBULATORY_CARE_PROVIDER_SITE_OTHER): Payer: Medicaid Other | Admitting: Internal Medicine

## 2022-03-04 VITALS — BP 116/73 | HR 98 | Temp 98.1°F | Ht 69.0 in | Wt 164.3 lb

## 2022-03-04 DIAGNOSIS — R198 Other specified symptoms and signs involving the digestive system and abdomen: Secondary | ICD-10-CM | POA: Diagnosis not present

## 2022-03-04 DIAGNOSIS — R1032 Left lower quadrant pain: Secondary | ICD-10-CM

## 2022-03-04 DIAGNOSIS — D123 Benign neoplasm of transverse colon: Secondary | ICD-10-CM

## 2022-03-04 DIAGNOSIS — K921 Melena: Secondary | ICD-10-CM

## 2022-03-04 DIAGNOSIS — K219 Gastro-esophageal reflux disease without esophagitis: Secondary | ICD-10-CM

## 2022-03-04 DIAGNOSIS — K625 Hemorrhage of anus and rectum: Secondary | ICD-10-CM

## 2022-03-04 DIAGNOSIS — R1111 Vomiting without nausea: Secondary | ICD-10-CM

## 2022-03-04 MED ORDER — ESOMEPRAZOLE MAGNESIUM 40 MG PO CPDR: 40.0000 mg | DELAYED_RELEASE_CAPSULE | Freq: Two times a day (BID) | ORAL | 11 refills | Status: DC

## 2022-03-04 NOTE — H&P (View-Only) (Signed)
Referring Provider: Abran Richard, MD Primary Care Physician:  Redmond School, MD Primary GI:  Dr. Abbey Chatters  Chief Complaint  Patient presents with   Abdominal Pain    Patient here today with complaints of left upper quad pain with occasional right sided pain. He reports he vomits every morning due to his acid reflux he says he takes esomeprzole 40 mg once per day occasionally he takes a second one in the evenings as reflux is not controlled. He also is taking sucralfate 1 gm  10 ml one hour before meals and at bed time.    HPI:   Gabriel Koch is a 56 y.o. male who presents to the clinic today for follow-up visit.  History of chronic GERD and Barrett's esophagus.  Previously underwent EGD for chronic reflux on 04/02/2020.  Findings consistent with Barrett's esophagus with esophageal biopsy showing intestinal metaplasia without dysplasia.  Stomach biopsies were negative for H. pylori.  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  Repeat EGD 03/21/2021 with polypectomy, path consistent with tubular adenoma, no high-grade dysplasia.  Multiple complaints for me today.  Continues to have intermittent abdominal pain, mild to moderate in severity.  Epigastric/right upper quadrant.  States he drinks beer to help with his pain.  With associated bloating as well.  Does note cutting back to 1-2 beers daily.  No longer having chronic diarrhea.  Actually is somewhat constipated now.  Last colonoscopy 04/02/2020 with 6-7 tubular adenomas removed, 3-4 which were greater than 10 mm.  Recommended 1 year recall.   Past Medical History:  Diagnosis Date   Alcohol abuse    Anxiety and depression    Blood transfusion    Complication of anesthesia    patient hemorrrhaged about 2 weeks after tonsillecotmy   GERD (gastroesophageal reflux disease)    Hypertension     Past Surgical History:  Procedure Laterality Date   APPENDECTOMY     BIOPSY   04/02/2020   Procedure: BIOPSY;  Surgeon: Eloise Harman, DO;  Location: [facility];  Service: Endoscopy;;   COLONOSCOPY WITH PROPOFOL N/A 04/02/2020   Procedure: COLONOSCOPY WITH PROPOFOL;  Surgeon: Eloise Harman, DO;  Location: [facility];  Service: Endoscopy;  Laterality: N/A;  8:30am   ESOPHAGOGASTRODUODENOSCOPY (EGD) WITH PROPOFOL N/A 04/02/2020   Procedure: ESOPHAGOGASTRODUODENOSCOPY (EGD) WITH PROPOFOL;  Surgeon: Eloise Harman, DO;  Location: [facility];  Service: Endoscopy;  Laterality: N/A;   ESOPHAGOGASTRODUODENOSCOPY (EGD) WITH PROPOFOL N/A 03/21/2021   Procedure: ESOPHAGOGASTRODUODENOSCOPY (EGD) WITH PROPOFOL;  Surgeon: Eloise Harman, DO;  Location: [facility];  Service: Endoscopy;  Laterality: N/A;  11:00am   POLYPECTOMY  04/02/2020   Procedure: POLYPECTOMY;  Surgeon: Eloise Harman, DO;  [facility]: [facility];  Service: Endoscopy;;   POLYPECTOMY  03/21/2021   Procedure: POLYPECTOMY;  Surgeon: Eloise Harman, DO;  Location: [facility];  Service: Endoscopy;;  duodenal   TONSILLECTOMY      Current Outpatient Medications  Medication Sig Dispense Refill   acetaminophen (TYLENOL) 500 MG tablet Take 2,000 mg by mouth every 6 (six) hours as needed.     albuterol (VENTOLIN HFA) 108 (90 Base) MCG/ACT inhaler Inhale 1 puff into the lungs every 4 (four) hours as needed for wheezing or shortness of breath.     amLODipine (NORVASC) 10 MG tablet Take 1 tablet (10 mg total) by mouth daily. 90 tablet 2   aspirin EC 81  MG tablet Take 1 tablet (81 mg total) by mouth daily with breakfast. 30 tablet 2   atorvastatin (LIPITOR) 20 MG tablet Take 1 tablet (20 mg total) by mouth every evening. (Patient taking differently: Take 20 mg by mouth daily.) 90 tablet 2   busPIRone (BUSPAR) 10 MG tablet Take 1 tablet (10 mg total) by mouth 2 (two) times daily. 180 tablet 3   esomeprazole (NEXIUM) 40 MG capsule Take 40 mg by mouth daily at 12 noon.     losartan (COZAAR) 50 MG  tablet Take 1 tablet (50 mg total) by mouth daily. 90 tablet 3   meloxicam (MOBIC) 7.5 MG tablet Take 7.5 mg by mouth daily.     pantoprazole (PROTONIX) 40 MG tablet Take 1 tablet (40 mg total) by mouth daily. 90 tablet 3   sucralfate (CARAFATE) 1 GM/10ML suspension Take 1 g by mouth 4 (four) times daily.     tiotropium (SPIRIVA) 18 MCG inhalation capsule Place 18 mcg into inhaler and inhale daily.     traZODone (DESYREL) 100 MG tablet Take 1 tablet (100 mg total) by mouth at bedtime. 90 tablet 2   zolpidem (AMBIEN) 5 MG tablet Take 5 mg by mouth at bedtime as needed for sleep.     No current facility-administered medications for this visit.    Allergies as of 03/04/2022 - Review Complete 03/04/2022  Allergen Reaction Noted   Bee venom Anaphylaxis 09/09/2011    Family History  Problem Relation Age of Onset   Colon cancer Neg Hx        Pt knows little about family history    Social History   Socioeconomic History   Marital status: Legally Separated    Spouse name: Not on file   Number of children: Not on file   Years of education: Not on file   Highest education level: Not on file  Occupational History   Not on file  Tobacco Use   Smoking status: Every Day    Packs/day: 1.50    Years: 30.00    Total pack years: 45.00    Types: Cigars, Cigarettes   Smokeless tobacco: Never  Vaping Use   Vaping Use: Never used  Substance and Sexual Activity   Alcohol use: Yes    Alcohol/week: 6.0 standard drinks of alcohol    Types: 6 Cans of beer per week    Comment: 2-40oz beers per day and liquor   Drug use: No   Sexual activity: Not Currently  Other Topics Concern   Not on file  Social History Narrative   Right handed   Social Determinants of Health   Financial Resource Strain: Not on file  Food Insecurity: Not on file  Transportation Needs: Not on file  Physical Activity: Not on file  Stress: Not on file  Social Connections: Not on file    Subjective: Review of  Systems  Constitutional:  Negative for chills and fever.  HENT:  Negative for congestion and hearing loss.   Eyes:  Negative for blurred vision and double vision.  Respiratory:  Negative for cough and shortness of breath.   Cardiovascular:  Negative for chest pain and palpitations.  Gastrointestinal:  Positive for abdominal pain, diarrhea and heartburn. Negative for blood in stool, constipation, melena and vomiting.  Genitourinary:  Negative for dysuria and urgency.  Musculoskeletal:  Negative for joint pain and myalgias.  Skin:  Negative for itching and rash.  Neurological:  Negative for dizziness and headaches.  Psychiatric/Behavioral:  Negative for depression.  The patient is not nervous/anxious.      Objective: BP 116/73 (BP Location: Left Arm, Patient Position: Sitting, Cuff Size: Large)   Pulse 98   Temp 98.1 F (36.7 C) (Oral)   Ht '5\' 9"'$  (1.753 m)   Wt 164 lb 4.8 oz (74.5 kg)   BMI 24.26 kg/m  Physical Exam Constitutional:      Appearance: Normal appearance.  HENT:     Head: Normocephalic and atraumatic.  Eyes:     Extraocular Movements: Extraocular movements intact.     Conjunctiva/sclera: Conjunctivae normal.  Cardiovascular:     Rate and Rhythm: Normal rate and regular rhythm.  Pulmonary:     Effort: Pulmonary effort is normal.     Breath sounds: Normal breath sounds.  Abdominal:     General: Bowel sounds are normal.     Palpations: Abdomen is soft.  Musculoskeletal:        General: Normal range of motion.     Cervical back: Normal range of motion and neck supple.  Skin:    General: Skin is warm.  Neurological:     General: No focal deficit present.     Mental Status: He is alert and oriented to person, place, and time.  Psychiatric:        Mood and Affect: Mood normal.        Behavior: Behavior normal.      Assessment: *GERD-well-controlled on Nexium twice daily *Barrett's esophagus *Right-sided abdominal pain with swelling *Abdominal  bloating *Adenomatous colon polyps  Plan: GERD not ideally controlled at present.  Will increase to as omeprazole twice daily.  Patient counseled to not take pantoprazole at this medication as he states he still has an old prescription of this.  Continue on liquid Carafate as needed.  Work on alcohol cessation.  In regards to his worsening abdominal pain, abdominal swelling, breakthrough reflux symptoms, will schedule for upper endoscopy to further evaluate.  At the same time we will perform colonoscopy given numerous adenomatous colon polyps prior.  The risks including infection, bleed, or perforation as well as benefits, limitations, alternatives and imponderables have been reviewed with the patient. Questions have been answered. All parties agreeable.       03/04/2022 11:14 AM   Disclaimer: This note was dictated with voice recognition software. Similar sounding words can inadvertently be transcribed and may not be corrected upon review.

## 2022-03-04 NOTE — Patient Instructions (Signed)
We will schedule you for upper endoscopy and colonoscopy to further evaluate your abdominal pain, bleeding, history of adenomatous colon polyps, chronic reflux.  I am going to increase your esomeprazole to twice daily.  Do not take pantoprazole with this medication.  I sent a new prescription to your pharmacy.  Continue on Carafate liquid as needed.  You can take this up to 4 times a day.  Continue to work on alcohol cessation.  Further recommendations to follow.  It was good seeing you again today.  Dr. Abbey Chatters

## 2022-03-04 NOTE — Progress Notes (Signed)
Referring Provider: Abran Richard, MD Primary Care Physician:  Redmond School, MD Primary GI:  Dr. Abbey Chatters  Chief Complaint  Patient presents with   Abdominal Pain    Patient here today with complaints of left upper quad pain with occasional right sided pain. He reports he vomits every morning due to his acid reflux he says he takes esomeprzole 40 mg once per day occasionally he takes a second one in the evenings as reflux is not controlled. He also is taking sucralfate 1 gm  10 ml one hour before meals and at bed time.    HPI:   Gabriel Koch is a 56 y.o. male who presents to the clinic today for follow-up visit.  History of chronic GERD and Barrett's esophagus.  Previously underwent EGD for chronic reflux on 04/02/2020.  Findings consistent with Barrett's esophagus with esophageal biopsy showing intestinal metaplasia without dysplasia.  Stomach biopsies were negative for H. pylori.  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  Repeat EGD 03/21/2021 with polypectomy, path consistent with tubular adenoma, no high-grade dysplasia.  Multiple complaints for me today.  Continues to have intermittent abdominal pain, mild to moderate in severity.  Epigastric/right upper quadrant.  States he drinks beer to help with his pain.  With associated bloating as well.  Does note cutting back to 1-2 beers daily.  No longer having chronic diarrhea.  Actually is somewhat constipated now.  Last colonoscopy 04/02/2020 with 6-7 tubular adenomas removed, 3-4 which were greater than 10 mm.  Recommended 1 year recall.   Past Medical History:  Diagnosis Date   Alcohol abuse    Anxiety and depression    Blood transfusion    Complication of anesthesia    patient hemorrrhaged about 2 weeks after tonsillecotmy   GERD (gastroesophageal reflux disease)    Hypertension     Past Surgical History:  Procedure Laterality Date   APPENDECTOMY     BIOPSY   04/02/2020   Procedure: BIOPSY;  Surgeon: Eloise Harman, DO;  Location: [facility];  Service: Endoscopy;;   COLONOSCOPY WITH PROPOFOL N/A 04/02/2020   Procedure: COLONOSCOPY WITH PROPOFOL;  Surgeon: Eloise Harman, DO;  Location: [facility];  Service: Endoscopy;  Laterality: N/A;  8:30am   ESOPHAGOGASTRODUODENOSCOPY (EGD) WITH PROPOFOL N/A 04/02/2020   Procedure: ESOPHAGOGASTRODUODENOSCOPY (EGD) WITH PROPOFOL;  Surgeon: Eloise Harman, DO;  [facility]: [facility];  Service: Endoscopy;  Laterality: N/A;   ESOPHAGOGASTRODUODENOSCOPY (EGD) WITH PROPOFOL N/A 03/21/2021   Procedure: ESOPHAGOGASTRODUODENOSCOPY (EGD) WITH PROPOFOL;  Surgeon: Eloise Harman, DO;  Location: [facility];  Service: Endoscopy;  Laterality: N/A;  11:00am   POLYPECTOMY  04/02/2020   Procedure: POLYPECTOMY;  Surgeon: Eloise Harman, DO;  [facility]: [facility];  Service: Endoscopy;;   POLYPECTOMY  03/21/2021   Procedure: POLYPECTOMY;  Surgeon: Eloise Harman, DO;  [facility]: [facility];  Service: Endoscopy;;  duodenal   TONSILLECTOMY      Current Outpatient Medications  Medication Sig Dispense Refill   acetaminophen (TYLENOL) 500 MG tablet Take 2,000 mg by mouth every 6 (six) hours as needed.     albuterol (VENTOLIN HFA) 108 (90 Base) MCG/ACT inhaler Inhale 1 puff into the lungs every 4 (four) hours as needed for wheezing or shortness of breath.     amLODipine (NORVASC) 10 MG tablet Take 1 tablet (10 mg total) by mouth daily. 90 tablet 2   aspirin EC 81  MG tablet Take 1 tablet (81 mg total) by mouth daily with breakfast. 30 tablet 2   atorvastatin (LIPITOR) 20 MG tablet Take 1 tablet (20 mg total) by mouth every evening. (Patient taking differently: Take 20 mg by mouth daily.) 90 tablet 2   busPIRone (BUSPAR) 10 MG tablet Take 1 tablet (10 mg total) by mouth 2 (two) times daily. 180 tablet 3   esomeprazole (NEXIUM) 40 MG capsule Take 40 mg by mouth daily at 12 noon.     losartan (COZAAR) 50 MG  tablet Take 1 tablet (50 mg total) by mouth daily. 90 tablet 3   meloxicam (MOBIC) 7.5 MG tablet Take 7.5 mg by mouth daily.     pantoprazole (PROTONIX) 40 MG tablet Take 1 tablet (40 mg total) by mouth daily. 90 tablet 3   sucralfate (CARAFATE) 1 GM/10ML suspension Take 1 g by mouth 4 (four) times daily.     tiotropium (SPIRIVA) 18 MCG inhalation capsule Place 18 mcg into inhaler and inhale daily.     traZODone (DESYREL) 100 MG tablet Take 1 tablet (100 mg total) by mouth at bedtime. 90 tablet 2   zolpidem (AMBIEN) 5 MG tablet Take 5 mg by mouth at bedtime as needed for sleep.     No current facility-administered medications for this visit.    Allergies as of 03/04/2022 - Review Complete 03/04/2022  Allergen Reaction Noted   Bee venom Anaphylaxis 09/09/2011    Family History  Problem Relation Age of Onset   Colon cancer Neg Hx        Pt knows little about family history    Social History   Socioeconomic History   Marital status: Legally Separated    Spouse name: Not on file   Number of children: Not on file   Years of education: Not on file   Highest education level: Not on file  Occupational History   Not on file  Tobacco Use   Smoking status: Every Day    Packs/day: 1.50    Years: 30.00    Total pack years: 45.00    Types: Cigars, Cigarettes   Smokeless tobacco: Never  Vaping Use   Vaping Use: Never used  Substance and Sexual Activity   Alcohol use: Yes    Alcohol/week: 6.0 standard drinks of alcohol    Types: 6 Cans of beer per week    Comment: 2-40oz beers per day and liquor   Drug use: No   Sexual activity: Not Currently  Other Topics Concern   Not on file  Social History Narrative   Right handed   Social Determinants of Health   Financial Resource Strain: Not on file  Food Insecurity: Not on file  Transportation Needs: Not on file  Physical Activity: Not on file  Stress: Not on file  Social Connections: Not on file    Subjective: Review of  Systems  Constitutional:  Negative for chills and fever.  HENT:  Negative for congestion and hearing loss.   Eyes:  Negative for blurred vision and double vision.  Respiratory:  Negative for cough and shortness of breath.   Cardiovascular:  Negative for chest pain and palpitations.  Gastrointestinal:  Positive for abdominal pain, diarrhea and heartburn. Negative for blood in stool, constipation, melena and vomiting.  Genitourinary:  Negative for dysuria and urgency.  Musculoskeletal:  Negative for joint pain and myalgias.  Skin:  Negative for itching and rash.  Neurological:  Negative for dizziness and headaches.  Psychiatric/Behavioral:  Negative for depression.  The patient is not nervous/anxious.      Objective: BP 116/73 (BP Location: Left Arm, Patient Position: Sitting, Cuff Size: Large)   Pulse 98   Temp 98.1 F (36.7 C) (Oral)   Ht '5\' 9"'$  (1.753 m)   Wt 164 lb 4.8 oz (74.5 kg)   BMI 24.26 kg/m  Physical Exam Constitutional:      Appearance: Normal appearance.  HENT:     Head: Normocephalic and atraumatic.  Eyes:     Extraocular Movements: Extraocular movements intact.     Conjunctiva/sclera: Conjunctivae normal.  Cardiovascular:     Rate and Rhythm: Normal rate and regular rhythm.  Pulmonary:     Effort: Pulmonary effort is normal.     Breath sounds: Normal breath sounds.  Abdominal:     General: Bowel sounds are normal.     Palpations: Abdomen is soft.  Musculoskeletal:        General: Normal range of motion.     Cervical back: Normal range of motion and neck supple.  Skin:    General: Skin is warm.  Neurological:     General: No focal deficit present.     Mental Status: He is alert and oriented to person, place, and time.  Psychiatric:        Mood and Affect: Mood normal.        Behavior: Behavior normal.      Assessment: *GERD-well-controlled on Nexium twice daily *Barrett's esophagus *Right-sided abdominal pain with swelling *Abdominal  bloating *Adenomatous colon polyps  Plan: GERD not ideally controlled at present.  Will increase to as omeprazole twice daily.  Patient counseled to not take pantoprazole at this medication as he states he still has an old prescription of this.  Continue on liquid Carafate as needed.  Work on alcohol cessation.  In regards to his worsening abdominal pain, abdominal swelling, breakthrough reflux symptoms, will schedule for upper endoscopy to further evaluate.  At the same time we will perform colonoscopy given numerous adenomatous colon polyps prior.  The risks including infection, bleed, or perforation as well as benefits, limitations, alternatives and imponderables have been reviewed with the patient. Questions have been answered. All parties agreeable.       03/04/2022 11:14 AM   Disclaimer: This note was dictated with voice recognition software. Similar sounding words can inadvertently be transcribed and may not be corrected upon review.

## 2022-03-16 ENCOUNTER — Encounter (HOSPITAL_COMMUNITY)
Admission: RE | Admit: 2022-03-16 | Discharge: 2022-03-16 | Disposition: A | Discharge status: Home / Self Care | Payer: Medicaid Other | Source: Ambulatory Visit | Attending: Internal Medicine | Admitting: Internal Medicine

## 2022-03-16 ENCOUNTER — Encounter (HOSPITAL_COMMUNITY): Payer: Self-pay

## 2022-03-16 HISTORY — DX: Chronic obstructive pulmonary disease, unspecified: J44.9

## 2022-03-16 HISTORY — DX: Post-traumatic stress disorder, unspecified: F43.10

## 2022-03-18 ENCOUNTER — Encounter (HOSPITAL_COMMUNITY): Payer: Self-pay

## 2022-03-18 ENCOUNTER — Ambulatory Visit (HOSPITAL_COMMUNITY)
Admission: RE | Admit: 2022-03-18 | Discharge: 2022-03-18 | Disposition: A | Discharge status: Home / Self Care | Payer: Medicaid Other | Attending: Internal Medicine | Admitting: Internal Medicine

## 2022-03-18 ENCOUNTER — Encounter (HOSPITAL_COMMUNITY)
Admission: RE | Disposition: A | Discharge status: Home / Self Care | Payer: Self-pay | Source: Home / Self Care | Attending: Internal Medicine

## 2022-03-18 ENCOUNTER — Ambulatory Visit (HOSPITAL_BASED_OUTPATIENT_CLINIC_OR_DEPARTMENT_OTHER): Payer: Medicaid Other | Admitting: Anesthesiology

## 2022-03-18 ENCOUNTER — Telehealth: Payer: Self-pay | Admitting: Internal Medicine

## 2022-03-18 ENCOUNTER — Ambulatory Visit (HOSPITAL_COMMUNITY): Payer: Medicaid Other | Admitting: Anesthesiology

## 2022-03-18 DIAGNOSIS — D132 Benign neoplasm of duodenum: Secondary | ICD-10-CM | POA: Insufficient documentation

## 2022-03-18 DIAGNOSIS — Z09 Encounter for follow-up examination after completed treatment for conditions other than malignant neoplasm: Secondary | ICD-10-CM | POA: Diagnosis present

## 2022-03-18 DIAGNOSIS — K2270 Barrett's esophagus without dysplasia: Secondary | ICD-10-CM | POA: Insufficient documentation

## 2022-03-18 DIAGNOSIS — K5730 Diverticulosis of large intestine without perforation or abscess without bleeding: Secondary | ICD-10-CM | POA: Diagnosis not present

## 2022-03-18 DIAGNOSIS — K5900 Constipation, unspecified: Secondary | ICD-10-CM | POA: Diagnosis not present

## 2022-03-18 DIAGNOSIS — K449 Diaphragmatic hernia without obstruction or gangrene: Secondary | ICD-10-CM | POA: Insufficient documentation

## 2022-03-18 DIAGNOSIS — F419 Anxiety disorder, unspecified: Secondary | ICD-10-CM | POA: Diagnosis not present

## 2022-03-18 DIAGNOSIS — J449 Chronic obstructive pulmonary disease, unspecified: Secondary | ICD-10-CM | POA: Insufficient documentation

## 2022-03-18 DIAGNOSIS — K922 Gastrointestinal hemorrhage, unspecified: Secondary | ICD-10-CM

## 2022-03-18 DIAGNOSIS — K2970 Gastritis, unspecified, without bleeding: Secondary | ICD-10-CM | POA: Insufficient documentation

## 2022-03-18 DIAGNOSIS — I10 Essential (primary) hypertension: Secondary | ICD-10-CM | POA: Insufficient documentation

## 2022-03-18 DIAGNOSIS — R1013 Epigastric pain: Secondary | ICD-10-CM | POA: Insufficient documentation

## 2022-03-18 DIAGNOSIS — K219 Gastro-esophageal reflux disease without esophagitis: Secondary | ICD-10-CM | POA: Insufficient documentation

## 2022-03-18 DIAGNOSIS — K648 Other hemorrhoids: Secondary | ICD-10-CM | POA: Diagnosis not present

## 2022-03-18 DIAGNOSIS — K3189 Other diseases of stomach and duodenum: Secondary | ICD-10-CM

## 2022-03-18 DIAGNOSIS — Z8673 Personal history of transient ischemic attack (TIA), and cerebral infarction without residual deficits: Secondary | ICD-10-CM | POA: Diagnosis not present

## 2022-03-18 DIAGNOSIS — F17210 Nicotine dependence, cigarettes, uncomplicated: Secondary | ICD-10-CM | POA: Diagnosis not present

## 2022-03-18 DIAGNOSIS — Z86010 Personal history of colonic polyps: Secondary | ICD-10-CM | POA: Diagnosis not present

## 2022-03-18 DIAGNOSIS — I2510 Atherosclerotic heart disease of native coronary artery without angina pectoris: Secondary | ICD-10-CM | POA: Insufficient documentation

## 2022-03-18 HISTORY — PX: COLONOSCOPY WITH PROPOFOL: SHX5780

## 2022-03-18 HISTORY — PX: BIOPSY: SHX5522

## 2022-03-18 HISTORY — PX: POLYPECTOMY: SHX5525

## 2022-03-18 HISTORY — PX: ESOPHAGOGASTRODUODENOSCOPY (EGD) WITH PROPOFOL: SHX5813

## 2022-03-18 SURGERY — COLONOSCOPY WITH PROPOFOL
Anesthesia: General

## 2022-03-18 MED ORDER — ONDANSETRON HCL 4 MG/2ML IJ SOLN
4.0000 mg | Freq: Once | INTRAMUSCULAR | Status: AC
  Administered 2022-03-18: 4 mg via INTRAVENOUS

## 2022-03-18 MED ORDER — LIDOCAINE HCL (PF) 2 % IJ SOLN
INTRAMUSCULAR | Status: AC
  Filled 2022-03-18: qty 5

## 2022-03-18 MED ORDER — DICYCLOMINE HCL 10 MG PO CAPS: 10.0000 mg | ORAL_CAPSULE | Freq: Three times a day (TID) | ORAL | 2 refills | Status: DC

## 2022-03-18 MED ORDER — FENTANYL CITRATE (PF) 100 MCG/2ML IJ SOLN
50.0000 ug | Freq: Once | INTRAMUSCULAR | Status: AC
  Administered 2022-03-18: 50 ug via INTRAVENOUS
  Filled 2022-03-18: qty 2

## 2022-03-18 MED ORDER — LIDOCAINE HCL (CARDIAC) PF 100 MG/5ML IV SOSY
PREFILLED_SYRINGE | INTRAVENOUS | Status: DC | PRN
  Administered 2022-03-18: 100 mg via INTRAVENOUS

## 2022-03-18 MED ORDER — ESOMEPRAZOLE MAGNESIUM 40 MG PO CPDR: 40.0000 mg | DELAYED_RELEASE_CAPSULE | Freq: Two times a day (BID) | ORAL | 11 refills | Status: DC

## 2022-03-18 MED ORDER — ONDANSETRON HCL 4 MG/2ML IJ SOLN
INTRAMUSCULAR | Status: AC
  Filled 2022-03-18: qty 2

## 2022-03-18 MED ORDER — PROPOFOL 500 MG/50ML IV EMUL
INTRAVENOUS | Status: DC | PRN
  Administered 2022-03-18: 180 ug/kg/min via INTRAVENOUS

## 2022-03-18 MED ORDER — PROPOFOL 500 MG/50ML IV EMUL
INTRAVENOUS | Status: AC
  Filled 2022-03-18: qty 50

## 2022-03-18 MED ORDER — PROPOFOL 10 MG/ML IV BOLUS
INTRAVENOUS | Status: DC | PRN
  Administered 2022-03-18: 100 mg via INTRAVENOUS

## 2022-03-18 MED ORDER — LACTATED RINGERS IV SOLN: INTRAVENOUS | Status: DC

## 2022-03-18 NOTE — Interval H&P Note (Signed)
History and Physical Interval Note:  03/18/2022 11:55 AM  Gabriel Koch  has presented today for surgery, with the diagnosis of abdominal pain,chronic reflux,bleeding,hx: polyps.  The various methods of treatment have been discussed with the patient and family. After consideration of risks, benefits and other options for treatment, the patient has consented to  Procedure(s) with comments: COLONOSCOPY WITH PROPOFOL (N/A) - 1:15 pm, pt knows to arrive at 10:45 can't come earlier due to transportation ESOPHAGOGASTRODUODENOSCOPY (EGD) WITH PROPOFOL (N/A) as a surgical intervention.  The patient's history has been reviewed, patient examined, no change in status, stable for surgery.  I have reviewed the patient's chart and labs.  Questions were answered to the patient's satisfaction.     Eloise Harman

## 2022-03-18 NOTE — Transfer of Care (Signed)
Immediate Anesthesia Transfer of Care Note  Patient: Gabriel Koch  Procedure(s) Performed: COLONOSCOPY WITH PROPOFOL ESOPHAGOGASTRODUODENOSCOPY (EGD) WITH PROPOFOL POLYPECTOMY BIOPSY  Patient Location: PACU  Anesthesia Type:General  Level of Consciousness: awake, alert  and oriented  Airway & Oxygen Therapy: Patient Spontanous Breathing  Post-op Assessment: Report given to RN, Post -op Vital signs reviewed and stable, Patient moving all extremities X 4 and Patient able to stick tongue midline  Post vital signs: Reviewed  Last Vitals:  Vitals Value Taken Time  BP 112/64 03/18/22 1320  Temp 36.5 C 03/18/22 1320  Pulse 81 03/18/22 1320  Resp 18 03/18/22 1320  SpO2 97 % 03/18/22 1320    Last Pain:  Vitals:   03/18/22 1320  TempSrc: Oral  PainSc: 0-No pain         Complications: No notable events documented.

## 2022-03-18 NOTE — Anesthesia Postprocedure Evaluation (Signed)
Anesthesia Post Note  Patient: Gabriel Koch  Procedure(s) Performed: COLONOSCOPY WITH PROPOFOL ESOPHAGOGASTRODUODENOSCOPY (EGD) WITH PROPOFOL POLYPECTOMY BIOPSY  Patient location during evaluation: Phase II Anesthesia Type: General Level of consciousness: awake and alert and oriented Pain management: pain level controlled Vital Signs Assessment: post-procedure vital signs reviewed and stable Respiratory status: spontaneous breathing, nonlabored ventilation and respiratory function stable Cardiovascular status: blood pressure returned to baseline and stable Postop Assessment: no apparent nausea or vomiting Anesthetic complications: no   No notable events documented.   Last Vitals:  Vitals:   03/18/22 1123 03/18/22 1320  BP: (!) 154/84 112/64  Pulse: 97 81  Resp: 17 18  Temp: 36.8 C 36.5 C  SpO2: 99% 97%    Last Pain:  Vitals:   03/18/22 1320  TempSrc: Oral  PainSc: 0-No pain                 Gabriel Koch

## 2022-03-18 NOTE — Op Note (Signed)
[facility] Patient Name: Gabriel Koch Procedure Date: 03/18/2022 12:30 PM MRN: [id number] Date of Birth: February 07, 1966 Attending MD: Elon Alas. Abbey Chatters DO CSN: [id number] Age: 56 Admit Type: Outpatient Procedure:                Upper GI endoscopy Indications:              Epigastric abdominal pain, Abdominal pain in the                            right upper quadrant, Heartburn Providers:                Elon Alas. Abbey Chatters, DO, Charlsie Quest. Insurance claims handler, Therapist, sports,                            Suzan Garibaldi. Risa Grill, Technician Referring MD:              Medicines:                See the Anesthesia note for documentation of the                            administered medications Complications:            No immediate complications. Estimated Blood Loss:     Estimated blood loss was minimal. Procedure:                Pre-Anesthesia Assessment:                           - The anesthesia plan was to use monitored                            anesthesia care (MAC).                           After obtaining informed consent, the endoscope was                            passed under direct vision. Throughout the                            procedure, the patient's blood pressure, pulse, and                            oxygen saturations were monitored continuously. The                            [id number]-H190 ([id number]) scope was introduced through the                            mouth, and advanced to the second part of duodenum.                            The upper GI endoscopy was accomplished without  difficulty. The patient tolerated the procedure                            well. Scope In: 12:46:52 PM Scope Out: 12:56:09 PM Total Procedure Duration: 0 hours 9 minutes 17 seconds  Findings:      A 3 cm hiatal hernia was present.      There were esophageal mucosal changes secondary to established       short-segment Barrett's disease present at the gastroesophageal        junction. The maximum longitudinal extent of these mucosal changes was 2       cm in length. Biopsies were taken with a cold forceps for histology.      Patchy mild inflammation characterized by erythema was found in the       gastric body and in the gastric antrum. Biopsies were taken with a cold       forceps for Helicobacter pylori testing.      A post polypectomy scar was found in the second portion of the duodenum.       There was ?residual polyp tissue. Area was resected with hot snare.       Tissue retrieval complete Impression:               - 3 cm hiatal hernia.                           - Esophageal mucosal changes secondary to                            established short-segment Barrett's disease.                            Biopsied.                           - Gastritis. Biopsied.                           - Duodenal scar. Moderate Sedation:      Per Anesthesia Care Recommendation:           - Patient has a contact number available for                            emergencies. The signs and symptoms of potential                            delayed complications were discussed with the                            patient. Return to normal activities tomorrow.                            Written discharge instructions were provided to the                            patient.                           -  Resume previous diet.                           - Continue present medications.                           - Await pathology results.                           - Use a proton pump inhibitor PO BID.                           - Return to GI clinic in 6 weeks. Procedure Code(s):        --- Professional ---                           [phone], Esophagogastroduodenoscopy, flexible,                            transoral; with biopsy, single or multiple Diagnosis Code(s):        --- Professional ---                           K44.9, Diaphragmatic hernia without obstruction or                             gangrene                           K22.70, Barrett's esophagus without dysplasia                           K29.70, Gastritis, unspecified, without bleeding                           K31.89, Other diseases of stomach and duodenum                           R10.13, Epigastric pain                           R10.11, Right upper quadrant pain                           R12, Heartburn CPT copyright 2019 American Medical Association. All rights reserved. The codes documented in this report are preliminary and upon coder review may  be revised to meet current compliance requirements. Elon Alas. Abbey Chatters, DO [place] Abbey Chatters, DO 03/18/2022 1:00:53 PM This report has been signed electronically. Number of Addenda: 0

## 2022-03-18 NOTE — Telephone Encounter (Signed)
Spoke to pt, he informed me that he has been taking Esomeprazole '40mg'$  twice a day and he is almost out. I called the pharmacy and they stated they were working on filling the prescription. Informed the pt of information.

## 2022-03-18 NOTE — Anesthesia Preprocedure Evaluation (Addendum)
Anesthesia Evaluation  Patient identified by MRN, date of birth, ID band Patient awake    Reviewed: Allergy & Precautions, NPO status , Patient's Chart, lab work & pertinent test results  History of Anesthesia Complications (+) history of anesthetic complications  Airway Mallampati: II  TM Distance: >3 FB Neck ROM: Full    Dental  (+) Dental Advisory Given, Poor Dentition, Loose, Chipped,    Pulmonary COPD,  COPD inhaler, Current SmokerPatient did not abstain from smoking.,    Pulmonary exam normal breath sounds clear to auscultation       Cardiovascular hypertension, Pt. on medications + CAD  Normal cardiovascular exam+ dysrhythmias (prolonged QT)  Rhythm:Regular Rate:Normal  1. Left ventricular ejection fraction, by estimation, is 60 to 65%. The left ventricle has normal function. The left ventricle has no regional wall motion abnormalities. Left ventricular diastolic parameters are consistent with Grade I diastolic dysfunction (impaired relaxation).  2. Right ventricular systolic function is normal. The right ventricular size is normal.  3. The mitral valve is normal in structure. No evidence of mitral valve regurgitation. No evidence of mitral stenosis.  4. The aortic valve is tricuspid. Aortic valve regurgitation is not visualized. No aortic stenosis is present.  5. The inferior vena cava is normal in size with greater than 50% respiratory variability, suggesting right atrial pressure of 3 mmHg.   Neuro/Psych PSYCHIATRIC DISORDERS Anxiety Depression CVA, Residual Symptoms    GI/Hepatic GERD  Medicated,(+)     substance abuse (h/o alcohol abuse)  alcohol use,   Endo/Other  negative endocrine ROS  Renal/GU negative Renal ROS  negative genitourinary   Musculoskeletal negative musculoskeletal ROS (+)   Abdominal   Peds  Hematology negative hematology ROS (+)   Anesthesia Other Findings 09-Feb-2022 16:35:36  [facility] ROUTINE RECORD 1966-04-27 (71 yr) Male  Vent. rate 87 BPM PR interval 218 ms QRS duration 129 ms QT/QTcB 373/449 ms P-R-T axes 77 79 40 Sinus rhythm Prolonged PR interval Right bundle branch block Minimal ST elevation, inferior leads  Reproductive/Obstetrics negative OB ROS                            Anesthesia Physical  Anesthesia Plan  ASA: 3  Anesthesia Plan: General   Post-op Pain Management: Minimal or no pain anticipated   Induction: Intravenous  PONV Risk Score and Plan: TIVA  Airway Management Planned: Nasal Cannula and Natural Airway  Additional Equipment:   Intra-op Plan:   Post-operative Plan:   Informed Consent: I have reviewed the patients History and Physical, chart, labs and discussed the procedure including the risks, benefits and alternatives for the proposed anesthesia with the patient or authorized representative who has indicated his/her understanding and acceptance.     Dental advisory given  Plan Discussed with: CRNA and Surgeon  Anesthesia Plan Comments:         Anesthesia Quick Evaluation

## 2022-03-18 NOTE — Telephone Encounter (Signed)
Pt is aware of banding with AB next week and also said 1 of 2 prescriptions were sent to [facility] and he needs them both. Please advise. [phone]

## 2022-03-18 NOTE — Op Note (Signed)
[facility] Patient Name: Gabriel Koch Procedure Date: 03/18/2022 12:58 PM MRN: [id number] Date of Birth: 1966/04/03 Attending MD: Elon Alas. Abbey Chatters DO CSN: [id number] Age: 56 Admit Type: Outpatient Procedure:                Colonoscopy Indications:              Surveillance: History of numerous (> 10) adenomas                            on last colonoscopy (< 3 yrs) Providers:                Elon Alas. Abbey Chatters, DO, Charlsie Quest. Insurance claims handler, Therapist, sports,                            Suzan Garibaldi. Risa Grill, Technician Referring MD:              Medicines:                See the Anesthesia note for documentation of the                            administered medications Complications:            No immediate complications. Estimated Blood Loss:     Estimated blood loss was minimal. Procedure:                Pre-Anesthesia Assessment:                           - The anesthesia plan was to use monitored                            anesthesia care (MAC).                           After obtaining informed consent, the colonoscope                            was passed under direct vision. Throughout the                            procedure, the patient's blood pressure, pulse, and                            oxygen saturations were monitored continuously. The                            PCF-HQ190L ([id number]) scope was introduced through                            the anus and advanced to the the cecum, identified                            by appendiceal orifice and ileocecal valve. The  colonoscopy was performed without difficulty. The                            patient tolerated the procedure well. The quality                            of the bowel preparation was evaluated using the                            BBPS [facility] Bowel Preparation Scale) with scores                            of: Right Colon = 3, Transverse Colon = 3 and Left                            Colon = 3  (entire mucosa seen well with no residual                            staining, small fragments of stool or opaque                            liquid). The total BBPS score equals 9. Scope In: 1:02:09 PM Scope Out: 1:15:26 PM Scope Withdrawal Time: 0 hours 10 minutes 27 seconds  Total Procedure Duration: 0 hours 13 minutes 17 seconds  Findings:      The perianal and digital rectal examinations were normal.      Non-bleeding internal hemorrhoids were found during retroflexion.      Multiple small-mouthed diverticula were found in the sigmoid colon.      A segmental area of moderately friable mucosa with spontaneous bleeding       was found in the transverse colon and in the ascending colon.       ?Barotrauma. Biopsies were taken with a cold forceps for histology. Impression:               - Non-bleeding internal hemorrhoids.                           - Diverticulosis in the sigmoid colon.                           - Friability with spontaneous bleeding in the                            transverse colon and in the ascending colon.                            Biopsied. Moderate Sedation:      Per Anesthesia Care Recommendation:           - Patient has a contact number available for                            emergencies. The signs and symptoms of potential  delayed complications were discussed with the                            patient. Return to normal activities tomorrow.                            Written discharge instructions were provided to the                            patient.                           - Resume previous diet.                           - Continue present medications.                           - Await pathology results.                           - Repeat colonoscopy in 5 years for surveillance.                           - Return to GI clinic in 6 weeks. Procedure Code(s):        --- Professional ---                           [id number],  Colonoscopy, flexible; with biopsy, single                            or multiple Diagnosis Code(s):        --- Professional ---                           Z86.010, Personal history of colonic polyps                           K64.8, Other hemorrhoids                           K92.2, Gastrointestinal hemorrhage, unspecified                           K57.30, Diverticulosis of large intestine without                            perforation or abscess without bleeding CPT copyright 2019 American Medical Association. All rights reserved. The codes documented in this report are preliminary and upon coder review may  be revised to meet current compliance requirements. Elon Alas. Abbey Chatters, DO [place] Abbey Chatters, DO 03/18/2022 1:19:47 PM This report has been signed electronically. Number of Addenda: 0

## 2022-03-18 NOTE — Discharge Instructions (Addendum)
EGD Discharge instructions Please read the instructions outlined below and refer to this sheet in the next few weeks. These discharge instructions provide you with general information on caring for yourself after you leave the hospital. Your doctor may also give you specific instructions. While your treatment has been planned according to the most current medical practices available, unavoidable complications occasionally occur. If you have any problems or questions after discharge, please call your doctor. ACTIVITY You may resume your regular activity but move at a slower pace for the next 24 hours.  Take frequent rest periods for the next 24 hours.  Walking will help expel (get rid of) the air and reduce the bloated feeling in your abdomen.  No driving for 24 hours (because of the anesthesia (medicine) used during the test).  You may shower.  Do not sign any important legal documents or operate any machinery for 24 hours (because of the anesthesia used during the test).  NUTRITION Drink plenty of fluids.  You may resume your normal diet.  Begin with a light meal and progress to your normal diet.  Avoid alcoholic beverages for 24 hours or as instructed by your caregiver.  MEDICATIONS You may resume your normal medications unless your caregiver tells you otherwise.  WHAT YOU CAN EXPECT TODAY You may experience abdominal discomfort such as a feeling of fullness or "gas" pains.  FOLLOW-UP Your doctor will discuss the results of your test with you.  SEEK IMMEDIATE MEDICAL ATTENTION IF ANY OF THE FOLLOWING OCCUR: Excessive nausea (feeling sick to your stomach) and/or vomiting.  Severe abdominal pain and distention (swelling).  Trouble swallowing.  Temperature over 101 F (37.8 C).  Rectal bleeding or vomiting of blood.       Colonoscopy Discharge Instructions  Read the instructions outlined below and refer to this sheet in the next few weeks. These discharge instructions provide you  with general information on caring for yourself after you leave the hospital. Your doctor may also give you specific instructions. While your treatment has been planned according to the most current medical practices available, unavoidable complications occasionally occur.   ACTIVITY You may resume your regular activity, but move at a slower pace for the next 24 hours.  Take frequent rest periods for the next 24 hours.  Walking will help get rid of the air and reduce the bloated feeling in your belly (abdomen).  No driving for 24 hours (because of the medicine (anesthesia) used during the test).   Do not sign any important legal documents or operate any machinery for 24 hours (because of the anesthesia used during the test).  NUTRITION Drink plenty of fluids.  You may resume your normal diet as instructed by your doctor.  Begin with a light meal and progress to your normal diet. Heavy or fried foods are harder to digest and may make you feel sick to your stomach (nauseated).  Avoid alcoholic beverages for 24 hours or as instructed.  MEDICATIONS You may resume your normal medications unless your doctor tells you otherwise.  WHAT YOU CAN EXPECT TODAY Some feelings of bloating in the abdomen.  Passage of more gas than usual.  Spotting of blood in your stool or on the toilet paper.  IF YOU HAD POLYPS REMOVED DURING THE COLONOSCOPY: No aspirin products for 7 days or as instructed.  No alcohol for 7 days or as instructed.  Eat a soft diet for the next 24 hours.  FINDING OUT THE RESULTS OF YOUR TEST Not all test  results are available during your visit. If your test results are not back during the visit, make an appointment with your caregiver to find out the results. Do not assume everything is normal if you have not heard from your caregiver or the medical facility. It is important for you to follow up on all of your test results.  SEEK IMMEDIATE MEDICAL ATTENTION IF: You have more than a  spotting of blood in your stool.  Your belly is swollen (abdominal distention).  You are nauseated or vomiting.  You have a temperature over 101.  You have abdominal pain or discomfort that is severe or gets worse throughout the day.   Your EGD revealed mild amount inflammation in your stomach.  I took biopsies of this to rule out infection with a bacteria called H. pylori.  Await pathology results, my office will contact you.  I also took samples of your esophagus.  There was a scar at the spot on your small bowel where I previously removed a large polyp, I resected this further today..  Continue on pantoprazole twice daily.  I did not find any polyps on your colonoscopy.  Recommend repeat in 5 years.  The right side your colon was quite friable with spontaneous bleeding and mild inflammation.  I took samples of this as well.  We will call with these results.  I hope you have a great rest of your week!  Elon Alas. Abbey Chatters, D.O. Gastroenterology and Hepatology [facility]

## 2022-03-19 ENCOUNTER — Telehealth: Payer: Self-pay

## 2022-03-19 LAB — SURGICAL PATHOLOGY

## 2022-03-19 NOTE — Telephone Encounter (Signed)
error 

## 2022-03-19 NOTE — Telephone Encounter (Signed)
Returned call back to [facility] regarding the pt's Rx for Esomeprazole 40 mg. She states that she didn't see the Rx from yesterday and I advised her that the pharmacy had signed off at 1:41 pm that they did. So I just went ahead and did a verbal for the same exact Rx.

## 2022-03-19 NOTE — Telephone Encounter (Signed)
Noted, thanks for taking care of this

## 2022-03-24 ENCOUNTER — Encounter (HOSPITAL_COMMUNITY): Payer: Self-pay | Admitting: Internal Medicine

## 2022-03-26 ENCOUNTER — Encounter: Payer: Self-pay | Admitting: Gastroenterology

## 2022-03-26 ENCOUNTER — Ambulatory Visit (INDEPENDENT_AMBULATORY_CARE_PROVIDER_SITE_OTHER): Payer: Medicaid Other | Admitting: Gastroenterology

## 2022-03-26 VITALS — BP 121/70 | HR 98 | Temp 97.8°F | Ht 69.0 in | Wt 163.4 lb

## 2022-03-26 DIAGNOSIS — K641 Second degree hemorrhoids: Secondary | ICD-10-CM

## 2022-03-26 NOTE — Patient Instructions (Signed)
  Please avoid straining.  You should limit your toilet time to 2-3 minutes at the most.   I recommend Benefiber 2 teaspoons each morning in the beverage of your choice!  Please call me with any concerns or issues!  I will see you in follow-up for additional banding in several weeks.  It was a pleasure to see you today. I want to create trusting relationships with patients to provide genuine, compassionate, and quality care. I value your feedback. If you receive a survey regarding your visit,  I greatly appreciate you taking time to fill this out.   Ceri Mayer W. Saory Carriero, PhD, ANP-BC [facility]        

## 2022-03-26 NOTE — Progress Notes (Signed)
       [name]  Gabriel Koch is a 56 y.o. male presenting today for consideration of hemorrhoid banding. Last colonoscopy Sept 2023: non-bleeding internal hemorrhoids, sigmoid diverticulosis, friability with spontaneous bleeding in transverse and ascending colon s/p biopsy. Biopsy without concerning findings. He notes bleeding, pressure, discomfort, burning, leaking/soiling.    The patient presents with symptomatic grade 2 hemorrhoids, unresponsive to maximal medical therapy, requesting rubber band ligation of his hemorrhoidal disease. All risks, benefits, and alternative forms of therapy were described and informed consent was obtained.  The decision was made to band the left lateral internal hemorrhoid, and the [place] was used to perform band ligation without complication. Digital anorectal examination was then performed to assure proper positioning of the band, and to adjust the banded tissue as required. The patient was discharged home without pain or other issues. Dietary and behavioral recommendations were given, along with follow-up instructions. The patient will return in several weeks for followup and possible additional banding as required.  No complications were encountered and the patient tolerated the procedure well.   Annitta Needs, PhD, ANP-BC [facility]

## 2022-04-16 ENCOUNTER — Encounter: Payer: Self-pay | Admitting: Gastroenterology

## 2022-04-16 ENCOUNTER — Ambulatory Visit (INDEPENDENT_AMBULATORY_CARE_PROVIDER_SITE_OTHER): Payer: Medicaid Other | Admitting: Gastroenterology

## 2022-04-16 VITALS — BP 129/72 | HR 106 | Temp 98.2°F | Ht 69.0 in | Wt 162.4 lb

## 2022-04-16 DIAGNOSIS — K641 Second degree hemorrhoids: Secondary | ICD-10-CM | POA: Diagnosis not present

## 2022-04-16 NOTE — Patient Instructions (Signed)
  Please avoid straining.  You should limit your toilet time to 2-3 minutes at the most.   I recommend Benefiber 2 teaspoons each morning in the beverage of your choice!  Please call me with any concerns or issues!  I will see you in follow-up for additional banding in several weeks.  I enjoyed seeing you again today! As you know, I value our relationship and want to provide genuine, compassionate, and quality care. I welcome your feedback. If you receive a survey regarding your visit,  I greatly appreciate you taking time to fill this out. See you next time!  Harpreet Pompey W. Harbor Paster, PhD, ANP-BC [facility]         

## 2022-04-16 NOTE — Progress Notes (Signed)
    [place] BANDING PROCEDURE NOTE  Gabriel Koch is a 56 y.o. male presenting today for consideration of hemorrhoid banding. Last colonoscopy Sept 2023: non-bleeding internal hemorrhoids, sigmoid diverticulosis, friability with spontaneous bleeding in transverse and ascending colon s/p biopsy. Biopsy without concerning findings. He notes bleeding, burning, leaking/soiling. Underwent banding of left lateral a few weeks ago.    The patient presents with symptomatic grade 2 hemorrhoids, unresponsive to maximal medical therapy, requesting rubber band ligation of his hemorrhoidal disease. All risks, benefits, and alternative forms of therapy were described and informed consent was obtained.   The decision was made to band the right posterior internal hemorrhoid, and the [place] was used to perform band ligation without complication. Digital anorectal examination was then performed to assure proper positioning of the band, and to adjust the banded tissue as required. The patient was discharged home without pain or other issues. Dietary and behavioral recommendations were given, along with follow-up instructions. The patient will return in several weeks for followup and possible additional banding as required.  No complications were encountered and the patient tolerated the procedure well.   Annitta Needs, PhD, ANP-BC [facility]

## 2022-05-07 ENCOUNTER — Encounter: Payer: Self-pay | Admitting: Gastroenterology

## 2022-05-07 ENCOUNTER — Ambulatory Visit (INDEPENDENT_AMBULATORY_CARE_PROVIDER_SITE_OTHER): Payer: Medicaid Other | Admitting: Gastroenterology

## 2022-05-07 VITALS — BP 114/68 | HR 108 | Temp 98.2°F | Ht 69.0 in | Wt 165.4 lb

## 2022-05-07 DIAGNOSIS — K641 Second degree hemorrhoids: Secondary | ICD-10-CM

## 2022-05-07 NOTE — Progress Notes (Signed)
    [place] BANDING PROCEDURE NOTE  DEVONTA BLANFORD is a 56 y.o. male presenting today for consideration of hemorrhoid banding. Last colonoscopy Sept 2023: non-bleeding internal hemorrhoids, sigmoid diverticulosis, friability with spontaneous bleeding in transverse and ascending colon s/p biopsy. Biopsy without concerning findings. He notes bleeding, burning, leaking/soiling. Left lateral and right posterior completed. He notes constipation but not taking fiber.    The patient presents with symptomatic grade 2 hemorrhoids, unresponsive to maximal medical therapy, requesting rubber band ligation of his hemorrhoidal disease. All risks, benefits, and alternative forms of therapy were described and informed consent was obtained.   The decision was made to band the right anterior internal hemorrhoid. Initially, band did not deploy correctly. I then placed device neutrally, and the [place] was used to perform band ligation without complication. Digital anorectal examination was then performed to assure proper positioning of the band (noted in RIGHT ANTERIOR), and to adjust the banded tissue as required. The patient was discharged home without pain or other issues. Dietary and behavioral recommendations were given, along with follow-up instructions. The patient will return in several weeks for followup and possible additional banding as required.  No complications were encountered and the patient tolerated the procedure well.   Annitta Needs, PhD, ANP-BC [facility]

## 2022-05-07 NOTE — Patient Instructions (Signed)
  Please avoid straining.  You should limit your toilet time to 2-3 minutes at the most.   I recommend Benefiber 2 teaspoons each morning in the beverage of your choice!  Please call me with any concerns or issues!  Dr. Abbey Chatters will see you in 6 months!  I enjoyed seeing you again today! As you know, I value our relationship and want to provide genuine, compassionate, and quality care. I welcome your feedback. If you receive a survey regarding your visit,  I greatly appreciate you taking time to fill this out. See you next time!  Annitta Needs, PhD, ANP-BC [facility]enter[facility]

## 2022-06-11 ENCOUNTER — Ambulatory Visit: Payer: Medicaid Other | Admitting: Orthopaedic Surgery

## 2022-06-11 ENCOUNTER — Encounter: Payer: Self-pay | Admitting: Orthopaedic Surgery

## 2022-06-11 VITALS — BP 110/70 | HR 98 | Ht 69.0 in | Wt 165.0 lb

## 2022-06-11 DIAGNOSIS — M79642 Pain in left hand: Secondary | ICD-10-CM | POA: Diagnosis not present

## 2022-06-11 NOTE — Patient Instructions (Signed)
You have been referred to see a hand specialist. We will send your referral to [facility]. If you have not heard from them in one week, call them to schedule your appointment.   [facility] [address] [place], [place] [address] [facility]: [place] is here all day on Tuesdays, Wednesday mornings, and Thursday mornings. If you need anything such as a medication refill, please either call BEFORE the end of the day on [facility] or send a message through [place]. Your pharmacy can send a refill request for you. Calling by the end of the day on [facility] allows [place] time to send Dr.Keeling the request and for him to respond before he leaves on Thursdays.  If Dr. Luna Glasgow is out of the office, we may send it to one of the other providers and they may not refill it for the same amount that your original prescription is for.   My name is Abby and I assist [place]. If you need anything before your next appointment, please do not hesitate to call the office at [phone] and ask to leave a message for me. I will respond within 24-48 business hours.

## 2022-06-11 NOTE — Progress Notes (Signed)
My finger is not working right.  He has had left long finger pain for over three months.  He has the long finger move to the little finger at times and it does not flex well.  He denies any trauma.  He has swelling at the MCP joint.  He has been seen in [place] by Abran Richard.  X-rays were negative.  He has subluxation of the extensor tendon of the long finger over the MCP joint on the left hand which then makes his long finger rotate to the little finger somewhat.  NV intact.  I can hold the extensor tendon "in place" and he moves the finger normally but it then subluxes after motion.    Diagnosis:  subluxation of the extensor tendon of the long finger at the MCP joint left.  Encounter Diagnosis  Name Primary?   Pain of left hand Yes   I will have hand surgeon see him.  I have explained the problem to him.  Call if any problem.  Precautions discussed.  Electronically Signed Sanjuana Kava, MD 12/7/20238:55 AM

## 2022-06-11 NOTE — Addendum Note (Signed)
Addended byCandice Camp on: 06/11/2022 09:00 AM   Modules accepted: Orders

## 2022-07-16 ENCOUNTER — Other Ambulatory Visit: Payer: Self-pay | Admitting: Orthopedic Surgery

## 2022-07-31 ENCOUNTER — Encounter (HOSPITAL_BASED_OUTPATIENT_CLINIC_OR_DEPARTMENT_OTHER): Payer: Self-pay | Admitting: Orthopedic Surgery

## 2022-07-31 ENCOUNTER — Other Ambulatory Visit: Payer: Self-pay

## 2022-07-31 NOTE — Progress Notes (Signed)
Reviewed case with Dr Roanna Banning who wants pt to come for CMP prior to surgery. Pt will try to get here (lives in [place]) and understands importance.

## 2022-07-31 NOTE — Progress Notes (Signed)
   07/31/22 1302  Pre-op Phone Call  Surgery Date Verified 08/06/22  Arrival Time Verified 0945  Surgery Location Verified [facility] [place]  Medical History Reviewed Yes  Is the patient taking a GLP-1 receptor agonist? No  Does the patient have diabetes? No diagnosis of diabetes  Do you have a history of heart problems? No  Antiarrhythmic device type  (NA)  Does patient have other implanted devices? No  Patient Teaching Enhanced Recovery  Patient educated about smoking cessation 24 hours prior to surgery. Yes  Patient verbalizes understanding of bowel prep? N/A  Med Rec Completed Yes  Take the Following Meds the Morning of Surgery take norvasc and nexium with sip water AM of sx, pt told by MD to stop ASA (he already has), pt to bring inhaler  Recent  Lab Work, EKG, CXR? No  NPO (Including gum & candy) After midnight  Patient instructed to stop clear liquids including Carb loading drink at: 0745 (12 oz water)  Stop Solids, Milk, Candy, and Gum STARTING AT MIDNIGHT  Did patient view EMMI videos? No  Responsible adult to drive and be with you for 24 hours? Yes  Name & Phone Number for Ride/Caregiver cousin, Patty  No Jewelry, money, nail polish or make-up.  No lotions, powders, perfumes. No shaving  48 hrs. prior to surgery. Yes  Contacts, Dentures & Glasses Will Have to be Removed Before OR. Yes  Please bring your ID and Insurance Card the morning of your surgery. (Surgery Centers Only) Yes  Bring any papers or x-rays with you that your surgeon gave you. Yes  Instructed to contact the location of procedure/ provider if they or anyone in their household develops symptoms or tests positive for COVID-19, has close contact with someone who tests positive for COVID, or has known exposure to any contagious illness. Yes  Call this number the morning of surgery  with any problems that may cancel your surgery. [phone]  Covid-19 Assessment  Have you had a positive COVID-19 test within the previous 90  days? No  COVID Testing Guidance Proceed with the additional questions.

## 2022-08-04 ENCOUNTER — Encounter (HOSPITAL_BASED_OUTPATIENT_CLINIC_OR_DEPARTMENT_OTHER)
Admission: RE | Admit: 2022-08-04 | Discharge: 2022-08-04 | Disposition: A | Discharge status: Home / Self Care | Payer: Medicaid Other | Source: Ambulatory Visit | Attending: Orthopedic Surgery | Admitting: Orthopedic Surgery

## 2022-08-04 DIAGNOSIS — Z01812 Encounter for preprocedural laboratory examination: Secondary | ICD-10-CM | POA: Insufficient documentation

## 2022-08-04 DIAGNOSIS — F1010 Alcohol abuse, uncomplicated: Secondary | ICD-10-CM | POA: Diagnosis not present

## 2022-08-04 LAB — BASIC METABOLIC PANEL
Anion gap: 10 (ref 5–15)
BUN: 6 mg/dL (ref 6–20)
CO2: 20 mmol/L — ABNORMAL LOW (ref 22–32)
Calcium: 9.2 mg/dL (ref 8.9–10.3)
Chloride: 103 mmol/L (ref 98–111)
Creatinine, Ser: 0.88 mg/dL (ref 0.61–1.24)
GFR, Estimated: >60 mL/min (ref >60)
Glucose, Bld: 104 mg/dL — ABNORMAL HIGH (ref 70–99)
Potassium: 3.9 mmol/L (ref 3.5–5.1)
Sodium: 133 mmol/L — ABNORMAL LOW (ref 135–145)

## 2022-08-04 NOTE — Progress Notes (Signed)

## 2022-08-06 ENCOUNTER — Encounter (HOSPITAL_BASED_OUTPATIENT_CLINIC_OR_DEPARTMENT_OTHER): Payer: Self-pay | Admitting: Orthopedic Surgery

## 2022-08-06 ENCOUNTER — Ambulatory Visit (HOSPITAL_BASED_OUTPATIENT_CLINIC_OR_DEPARTMENT_OTHER)
Admission: RE | Admit: 2022-08-06 | Discharge: 2022-08-06 | Disposition: A | Discharge status: Home / Self Care | Payer: Medicaid Other | Attending: Orthopedic Surgery | Admitting: Orthopedic Surgery

## 2022-08-06 ENCOUNTER — Ambulatory Visit (HOSPITAL_BASED_OUTPATIENT_CLINIC_OR_DEPARTMENT_OTHER): Payer: Medicaid Other | Admitting: Anesthesiology

## 2022-08-06 ENCOUNTER — Encounter (HOSPITAL_BASED_OUTPATIENT_CLINIC_OR_DEPARTMENT_OTHER)
Admission: RE | Disposition: A | Discharge status: Home / Self Care | Payer: Self-pay | Source: Home / Self Care | Attending: Orthopedic Surgery

## 2022-08-06 ENCOUNTER — Other Ambulatory Visit: Payer: Self-pay

## 2022-08-06 DIAGNOSIS — K219 Gastro-esophageal reflux disease without esophagitis: Secondary | ICD-10-CM | POA: Insufficient documentation

## 2022-08-06 DIAGNOSIS — S63203A Unspecified subluxation of left middle finger, initial encounter: Secondary | ICD-10-CM | POA: Diagnosis present

## 2022-08-06 DIAGNOSIS — J449 Chronic obstructive pulmonary disease, unspecified: Secondary | ICD-10-CM | POA: Insufficient documentation

## 2022-08-06 DIAGNOSIS — X58XXXA Exposure to other specified factors, initial encounter: Secondary | ICD-10-CM | POA: Insufficient documentation

## 2022-08-06 DIAGNOSIS — F17290 Nicotine dependence, other tobacco product, uncomplicated: Secondary | ICD-10-CM | POA: Insufficient documentation

## 2022-08-06 DIAGNOSIS — F32A Depression, unspecified: Secondary | ICD-10-CM | POA: Insufficient documentation

## 2022-08-06 DIAGNOSIS — Z8673 Personal history of transient ischemic attack (TIA), and cerebral infarction without residual deficits: Secondary | ICD-10-CM | POA: Insufficient documentation

## 2022-08-06 DIAGNOSIS — I10 Essential (primary) hypertension: Secondary | ICD-10-CM | POA: Diagnosis not present

## 2022-08-06 DIAGNOSIS — F17210 Nicotine dependence, cigarettes, uncomplicated: Secondary | ICD-10-CM

## 2022-08-06 DIAGNOSIS — F1010 Alcohol abuse, uncomplicated: Secondary | ICD-10-CM

## 2022-08-06 DIAGNOSIS — I2510 Atherosclerotic heart disease of native coronary artery without angina pectoris: Secondary | ICD-10-CM

## 2022-08-06 DIAGNOSIS — F419 Anxiety disorder, unspecified: Secondary | ICD-10-CM | POA: Diagnosis not present

## 2022-08-06 HISTORY — PX: REPAIR EXTENSOR TENDON: SHX5382

## 2022-08-06 HISTORY — PX: STERIOD INJECTION: SHX5046

## 2022-08-06 SURGERY — REPAIR, TENDON, EXTENSOR
Anesthesia: General | Site: Middle Finger | Laterality: Left

## 2022-08-06 MED ORDER — 0.9 % SODIUM CHLORIDE (POUR BTL) OPTIME
TOPICAL | Status: DC | PRN
  Administered 2022-08-06: 75 mL

## 2022-08-06 MED ORDER — DEXAMETHASONE SODIUM PHOSPHATE 10 MG/ML IJ SOLN
INTRAMUSCULAR | Status: AC
  Filled 2022-08-06: qty 1

## 2022-08-06 MED ORDER — BETAMETHASONE SOD PHOS & ACET 6 (3-3) MG/ML IJ SUSP
INTRAMUSCULAR | Status: DC | PRN
  Administered 2022-08-06: 6 mg

## 2022-08-06 MED ORDER — LIDOCAINE HCL (PF) 1 % IJ SOLN
INTRAMUSCULAR | Status: AC
  Filled 2022-08-06: qty 5

## 2022-08-06 MED ORDER — LIDOCAINE HCL (PF) 1 % IJ SOLN
INTRAMUSCULAR | Status: DC | PRN
  Administered 2022-08-06: 1 mL

## 2022-08-06 MED ORDER — MIDAZOLAM HCL 5 MG/5ML IJ SOLN
INTRAMUSCULAR | Status: DC | PRN
  Administered 2022-08-06: 2 mg via INTRAVENOUS

## 2022-08-06 MED ORDER — CEFAZOLIN SODIUM-DEXTROSE 2-4 GM/100ML-% IV SOLN
2.0000 g | INTRAVENOUS | Status: AC
  Administered 2022-08-06: 2 g via INTRAVENOUS

## 2022-08-06 MED ORDER — ACETAMINOPHEN 160 MG/5ML PO SOLN: 325.0000 mg | ORAL | Status: DC | PRN

## 2022-08-06 MED ORDER — ONDANSETRON HCL 4 MG/2ML IJ SOLN
INTRAMUSCULAR | Status: AC
  Filled 2022-08-06: qty 2

## 2022-08-06 MED ORDER — FENTANYL CITRATE (PF) 100 MCG/2ML IJ SOLN: 25.0000 ug | INTRAMUSCULAR | Status: DC | PRN

## 2022-08-06 MED ORDER — PROPOFOL 500 MG/50ML IV EMUL
INTRAVENOUS | Status: DC | PRN
  Administered 2022-08-06: 50 ug/kg/min via INTRAVENOUS

## 2022-08-06 MED ORDER — OXYCODONE HCL 5 MG/5ML PO SOLN: 5.0000 mg | Freq: Once | ORAL | Status: DC | PRN

## 2022-08-06 MED ORDER — ONDANSETRON HCL 4 MG/2ML IJ SOLN
INTRAMUSCULAR | Status: DC | PRN
  Administered 2022-08-06: 4 mg via INTRAVENOUS

## 2022-08-06 MED ORDER — PROPOFOL 10 MG/ML IV BOLUS
INTRAVENOUS | Status: DC | PRN
  Administered 2022-08-06: 200 mg via INTRAVENOUS
  Administered 2022-08-06: 100 mg via INTRAVENOUS

## 2022-08-06 MED ORDER — LIDOCAINE 2% (20 MG/ML) 5 ML SYRINGE
INTRAMUSCULAR | Status: AC
  Filled 2022-08-06: qty 5

## 2022-08-06 MED ORDER — AMISULPRIDE (ANTIEMETIC) 5 MG/2ML IV SOLN: 10.0000 mg | Freq: Once | INTRAVENOUS | Status: DC | PRN

## 2022-08-06 MED ORDER — PROPOFOL 500 MG/50ML IV EMUL
INTRAVENOUS | Status: AC
  Filled 2022-08-06: qty 50

## 2022-08-06 MED ORDER — LIDOCAINE 2% (20 MG/ML) 5 ML SYRINGE
INTRAMUSCULAR | Status: DC | PRN
  Administered 2022-08-06: 60 mg via INTRAVENOUS

## 2022-08-06 MED ORDER — ACETAMINOPHEN 325 MG PO TABS: 325.0000 mg | ORAL_TABLET | ORAL | Status: DC | PRN

## 2022-08-06 MED ORDER — SUCCINYLCHOLINE CHLORIDE 200 MG/10ML IV SOSY
PREFILLED_SYRINGE | INTRAVENOUS | Status: AC
  Filled 2022-08-06: qty 10

## 2022-08-06 MED ORDER — FENTANYL CITRATE (PF) 100 MCG/2ML IJ SOLN
INTRAMUSCULAR | Status: AC
  Filled 2022-08-06: qty 2

## 2022-08-06 MED ORDER — FENTANYL CITRATE (PF) 100 MCG/2ML IJ SOLN
INTRAMUSCULAR | Status: DC | PRN
  Administered 2022-08-06: 100 ug via INTRAVENOUS

## 2022-08-06 MED ORDER — SUCCINYLCHOLINE CHLORIDE 200 MG/10ML IV SOSY
PREFILLED_SYRINGE | INTRAVENOUS | Status: DC | PRN
  Administered 2022-08-06: 100 mg via INTRAVENOUS

## 2022-08-06 MED ORDER — OXYCODONE HCL 5 MG PO TABS: 5.0000 mg | ORAL_TABLET | Freq: Once | ORAL | Status: DC | PRN

## 2022-08-06 MED ORDER — CEFAZOLIN SODIUM-DEXTROSE 2-4 GM/100ML-% IV SOLN
INTRAVENOUS | Status: AC
  Filled 2022-08-06: qty 100

## 2022-08-06 MED ORDER — ALBUTEROL SULFATE HFA 108 (90 BASE) MCG/ACT IN AERS
INHALATION_SPRAY | RESPIRATORY_TRACT | Status: DC | PRN
  Administered 2022-08-06 (×2): 2 via RESPIRATORY_TRACT

## 2022-08-06 MED ORDER — LACTATED RINGERS IV SOLN: INTRAVENOUS | Status: DC

## 2022-08-06 MED ORDER — DEXAMETHASONE SODIUM PHOSPHATE 4 MG/ML IJ SOLN
INTRAMUSCULAR | Status: DC | PRN
  Administered 2022-08-06: 10 mg via INTRAVENOUS

## 2022-08-06 MED ORDER — ACETAMINOPHEN 10 MG/ML IV SOLN: 1000.0000 mg | Freq: Once | INTRAVENOUS | Status: DC | PRN

## 2022-08-06 MED ORDER — MIDAZOLAM HCL 2 MG/2ML IJ SOLN
INTRAMUSCULAR | Status: AC
  Filled 2022-08-06: qty 2

## 2022-08-06 MED ORDER — ONDANSETRON HCL 4 MG/2ML IJ SOLN
4.0000 mg | Freq: Once | INTRAMUSCULAR | Status: AC
  Administered 2022-08-06: 4 mg via INTRAVENOUS

## 2022-08-06 MED ORDER — BUPIVACAINE HCL (PF) 0.25 % IJ SOLN
INTRAMUSCULAR | Status: DC | PRN
  Administered 2022-08-06: 9 mL

## 2022-08-06 MED ORDER — OXYCODONE HCL 5 MG PO TABS: ORAL_TABLET | ORAL | 0 refills | Status: DC

## 2022-08-06 MED ORDER — BETAMETHASONE SOD PHOS & ACET 6 (3-3) MG/ML IJ SUSP
INTRAMUSCULAR | Status: AC
  Filled 2022-08-06: qty 10

## 2022-08-06 SURGICAL SUPPLY — 93 items
APL PRP STRL LF DISP 70% ISPRP (MISCELLANEOUS) ×2
APL SKNCLS STERI-STRIP NONHPOA (GAUZE/BANDAGES/DRESSINGS)
BAG DECANTER FOR FLEXI CONT (MISCELLANEOUS) IMPLANT
BALL CTTN LRG ABS STRL LF (GAUZE/BANDAGES/DRESSINGS)
BENZOIN TINCTURE PRP APPL 2/3 (GAUZE/BANDAGES/DRESSINGS) IMPLANT
BLADE MINI RND TIP GREEN BEAV (BLADE) IMPLANT
BLADE SURG 15 STRL LF DISP TIS (BLADE) ×4 IMPLANT
BLADE SURG 15 STRL SS (BLADE) ×4
BNDG CMPR 75X21 PLY HI ABS (MISCELLANEOUS)
BNDG CMPR 9X4 STRL LF SNTH (GAUZE/BANDAGES/DRESSINGS) ×2
BNDG ELASTIC 2X5.8 VLCR STR LF (GAUZE/BANDAGES/DRESSINGS) IMPLANT
BNDG ELASTIC 3X5.8 VLCR STR LF (GAUZE/BANDAGES/DRESSINGS) ×2 IMPLANT
BNDG ESMARK 4X9 LF (GAUZE/BANDAGES/DRESSINGS) ×2 IMPLANT
BNDG GAUZE DERMACEA FLUFF 4 (GAUZE/BANDAGES/DRESSINGS) IMPLANT
BNDG GZE DERMACEA 4 6PLY (GAUZE/BANDAGES/DRESSINGS) ×2
CHLORAPREP W/TINT 26 (MISCELLANEOUS) ×2 IMPLANT
CORD BIPOLAR FORCEPS 12FT (ELECTRODE) ×2 IMPLANT
COTTONBALL LRG STERILE PKG (GAUZE/BANDAGES/DRESSINGS) IMPLANT
COVER BACK TABLE 60X90IN (DRAPES) ×2 IMPLANT
COVER MAYO STAND STRL (DRAPES) ×2 IMPLANT
CUFF TOURN SGL QUICK 18X4 (TOURNIQUET CUFF) ×2 IMPLANT
DRAPE EXTREMITY T 121X128X90 (DISPOSABLE) ×2 IMPLANT
DRAPE OEC MINIVIEW 54X84 (DRAPES) IMPLANT
DRAPE SURG 17X23 STRL (DRAPES) ×2 IMPLANT
GAUZE 4X4 16PLY ~~LOC~~+RFID DBL (SPONGE) IMPLANT
GAUZE PAD ABD 8X10 STRL (GAUZE/BANDAGES/DRESSINGS) IMPLANT
GAUZE SPONGE 4X4 12PLY STRL (GAUZE/BANDAGES/DRESSINGS) ×2 IMPLANT
GAUZE STRETCH 2X75IN STRL (MISCELLANEOUS) IMPLANT
GAUZE XEROFORM 1X8 LF (GAUZE/BANDAGES/DRESSINGS) ×2 IMPLANT
GLOVE BIO SURGEON STRL SZ7.5 (GLOVE) ×2 IMPLANT
GLOVE BIOGEL PI IND STRL 7.0 (GLOVE) IMPLANT
GLOVE BIOGEL PI IND STRL 8 (GLOVE) ×2 IMPLANT
GLOVE BIOGEL PI IND STRL 8.5 (GLOVE) IMPLANT
GLOVE SURG ORTHO 8.0 STRL STRW (GLOVE) IMPLANT
GLOVE SURG SYN 7.5  E (GLOVE) ×2
GLOVE SURG SYN 7.5 E (GLOVE) ×2 IMPLANT
GLOVE SURG SYN 7.5 PF PI (GLOVE) IMPLANT
GOWN STRL REUS W/ TWL LRG LVL3 (GOWN DISPOSABLE) ×2 IMPLANT
GOWN STRL REUS W/ TWL XL LVL3 (GOWN DISPOSABLE) IMPLANT
GOWN STRL REUS W/TWL LRG LVL3 (GOWN DISPOSABLE)
GOWN STRL REUS W/TWL XL LVL3 (GOWN DISPOSABLE) ×4 IMPLANT
K-WIRE DBL .035X4 NSTRL (WIRE)
KWIRE DBL .035X4 NSTRL (WIRE) IMPLANT
LOOP VASCLR MAXI BLUE 18IN ST (MISCELLANEOUS) IMPLANT
LOOP VASCULAR MAXI 18 BLUE (MISCELLANEOUS)
LOOPS VASCLR MAXI BLUE 18IN ST (MISCELLANEOUS) IMPLANT
NDL 18GX1X1/2 (RX/OR ONLY) (NEEDLE) IMPLANT
NDL HYPO 25X1 1.5 SAFETY (NEEDLE) IMPLANT
NDL KEITH (NEEDLE) IMPLANT
NEEDLE 18GX1X1/2 (RX/OR ONLY) (NEEDLE) ×2 IMPLANT
NEEDLE HYPO 22GX1.5 SAFETY (NEEDLE) IMPLANT
NEEDLE HYPO 25X1 1.5 SAFETY (NEEDLE) ×4 IMPLANT
NEEDLE KEITH (NEEDLE) IMPLANT
NS IRRIG 1000ML POUR BTL (IV SOLUTION) ×2 IMPLANT
PACK BASIN DAY SURGERY FS (CUSTOM PROCEDURE TRAY) ×2 IMPLANT
PAD CAST 3X4 CTTN HI CHSV (CAST SUPPLIES) ×2 IMPLANT
PAD CAST 4YDX4 CTTN HI CHSV (CAST SUPPLIES) IMPLANT
PADDING CAST ABS COTTON 3X4 (CAST SUPPLIES) IMPLANT
PADDING CAST ABS COTTON 4X4 ST (CAST SUPPLIES) ×2 IMPLANT
PADDING CAST COTTON 3X4 STRL (CAST SUPPLIES) ×2
PADDING CAST COTTON 4X4 STRL (CAST SUPPLIES)
SLEEVE SCD COMPRESS KNEE MED (STOCKING) IMPLANT
SPIKE FLUID TRANSFER (MISCELLANEOUS) IMPLANT
SPLINT PLASTER CAST XFAST 3X15 (CAST SUPPLIES) IMPLANT
SPLINT PLASTER CAST XFAST 4X15 (CAST SUPPLIES) IMPLANT
STOCKINETTE 4X48 STRL (DRAPES) ×2 IMPLANT
STRIP CLOSURE SKIN 1/2X4 (GAUZE/BANDAGES/DRESSINGS) IMPLANT
SUT CHROMIC 5 0 P 3 (SUTURE) IMPLANT
SUT ETHIBOND 3-0 V-5 (SUTURE) IMPLANT
SUT ETHILON 3 0 PS 1 (SUTURE) IMPLANT
SUT ETHILON 4 0 PS 2 18 (SUTURE) IMPLANT
SUT FIBERWIRE 3-0 18 TAPR NDL (SUTURE)
SUT FIBERWIRE 4-0 18 DIAM BLUE (SUTURE)
SUT MERSILENE 2.0 SH NDLE (SUTURE) IMPLANT
SUT MERSILENE 4 0 P 3 (SUTURE) IMPLANT
SUT MNCRL AB 4-0 PS2 18 (SUTURE) IMPLANT
SUT PROLENE 2 0 SH DA (SUTURE) IMPLANT
SUT PROLENE 6 0 P 1 18 (SUTURE) IMPLANT
SUT SILK 2 0 PERMA HAND 18 BK (SUTURE) IMPLANT
SUT SILK 4 0 PS 2 (SUTURE) IMPLANT
SUT VIC AB 3-0 PS1 18 (SUTURE)
SUT VIC AB 3-0 PS1 18XBRD (SUTURE) IMPLANT
SUT VIC AB 4-0 P-3 18XBRD (SUTURE) IMPLANT
SUT VIC AB 4-0 P3 18 (SUTURE)
SUT VICRYL 4-0 PS2 18IN ABS (SUTURE) IMPLANT
SUTURE FIBERWR 3-0 18 TAPR NDL (SUTURE) IMPLANT
SUTURE FIBERWR 4-0 18 DIA BLUE (SUTURE) IMPLANT
SYR BULB EAR ULCER 3OZ GRN STR (SYRINGE) ×2 IMPLANT
SYR CONTROL 10ML LL (SYRINGE) IMPLANT
TOWEL GREEN STERILE FF (TOWEL DISPOSABLE) ×4 IMPLANT
TUBE NG 5FR 35IN ENFIT (TUBING) IMPLANT
UNDERPAD 30X36 HEAVY ABSORB (UNDERPADS AND DIAPERS) ×2 IMPLANT
VASCULAR TIE MAXI BLUE 18IN ST (MISCELLANEOUS)

## 2022-08-06 NOTE — Transfer of Care (Signed)
Immediate Anesthesia Transfer of Care Note  Patient: Gabriel Koch  Procedure(s) Performed: LEFT LONG FINGER EXTENSOR TENDON CENTRALIZATION (Left: Middle Finger) LEFT CARPAL TUNNEL INJECTION (Left: Hand)  Patient Location: PACU  Anesthesia Type:General  Level of Consciousness: awake, alert , oriented, and drowsy  Airway & Oxygen Therapy: Patient Spontanous Breathing and Patient connected to face mask oxygen  Post-op Assessment: Report given to RN and Post -op Vital signs reviewed and stable  Post vital signs: Reviewed and stable  Last Vitals:  Vitals Value Taken Time  BP    Temp    Pulse    Resp    SpO2      Last Pain:  Vitals:   08/06/22 1000  TempSrc: Oral  PainSc: 0-No pain      Patients Stated Pain Goal: 3 (97/74/14 2395)  Complications: No notable events documented.

## 2022-08-06 NOTE — Anesthesia Postprocedure Evaluation (Signed)
Anesthesia Post Note  Patient: ABDIFATAH COLQUHOUN  Procedure(s) Performed: LEFT LONG FINGER EXTENSOR TENDON CENTRALIZATION (Left: Middle Finger) LEFT CARPAL TUNNEL INJECTION (Left: Hand)     Patient location during evaluation: PACU Anesthesia Type: General Level of consciousness: awake and alert Pain management: pain level controlled Vital Signs Assessment: post-procedure vital signs reviewed and stable Respiratory status: spontaneous breathing, nonlabored ventilation, respiratory function stable and patient connected to nasal cannula oxygen Cardiovascular status: blood pressure returned to baseline and stable Postop Assessment: no apparent nausea or vomiting Anesthetic complications: no  No notable events documented.  Last Vitals:  Vitals:   08/06/22 1245 08/06/22 1314  BP: 116/69 (!) 142/77  Pulse: 97 94  Resp: 17 16  Temp:  36.6 C  SpO2: 93% 94%    Last Pain:  Vitals:   08/06/22 1314  TempSrc:   PainSc: 0-No pain                 Tiajuana Amass

## 2022-08-06 NOTE — Discharge Instructions (Addendum)

## 2022-08-06 NOTE — H&P (Signed)
Gabriel Koch is an 57 y.o. male.   Chief Complaint: tendon subluxation HPI: 57 yo male with left long finger extensor tendon subluxation and numbness and tingling in the fingers.  Has tried splinting without relief.  He wishes to have extensor tendon subluxation and injection of carpal tunnel.  Allergies:  Allergies  Allergen Reactions   Bee Venom Anaphylaxis    Past Medical History:  Diagnosis Date   Alcohol abuse    Anxiety and depression    Blood transfusion    Complication of anesthesia    patient hemorrrhaged about 2 weeks after tonsillecotmy   COPD (chronic obstructive pulmonary disease) (HCC)    GERD (gastroesophageal reflux disease)    Hypertension    PTSD (post-traumatic stress disorder)    Stroke ([place]) 2021    Past Surgical History:  Procedure Laterality Date   APPENDECTOMY     BIOPSY  04/02/2020   Procedure: BIOPSY;  Surgeon: Eloise Harman, DO;  [facility];  Service: Endoscopy;;   BIOPSY  03/18/2022   Procedure: BIOPSY;  Surgeon: Eloise Harman, DO;  [facility];  Service: Endoscopy;;   COLONOSCOPY WITH PROPOFOL N/A 04/02/2020   Procedure: COLONOSCOPY WITH PROPOFOL;  Surgeon: Eloise Harman, DO;  [facility];  Service: Endoscopy;  Laterality: N/A;  8:30am   COLONOSCOPY WITH PROPOFOL N/A 03/18/2022   Procedure: COLONOSCOPY WITH PROPOFOL;  Surgeon: Eloise Harman, DO;  [facility];  Service: Endoscopy;  Laterality: N/A;  1:15 pm, pt knows to arrive at 10:45 can't come earlier due to transportation   ESOPHAGOGASTRODUODENOSCOPY (EGD) WITH PROPOFOL N/A 04/02/2020   Procedure: ESOPHAGOGASTRODUODENOSCOPY (EGD) WITH PROPOFOL;  Surgeon: Eloise Harman, DO;  [facility];  Service: Endoscopy;  Laterality: N/A;   ESOPHAGOGASTRODUODENOSCOPY (EGD) WITH PROPOFOL N/A 03/21/2021   Procedure: ESOPHAGOGASTRODUODENOSCOPY (EGD) WITH PROPOFOL;  Surgeon: Eloise Harman, DO;  [facility];  Service:  Endoscopy;  Laterality: N/A;  11:00am   ESOPHAGOGASTRODUODENOSCOPY (EGD) WITH PROPOFOL N/A 03/18/2022   Procedure: ESOPHAGOGASTRODUODENOSCOPY (EGD) WITH PROPOFOL;  Surgeon: Eloise Harman, DO;  [facility];  Service: Endoscopy;  Laterality: N/A;   POLYPECTOMY  04/02/2020   Procedure: POLYPECTOMY;  Surgeon: Eloise Harman, DO;  [facility];  Service: Endoscopy;;   POLYPECTOMY  03/21/2021   Procedure: POLYPECTOMY;  Surgeon: Eloise Harman, DO;  [facility];  Service: Endoscopy;;  duodenal   POLYPECTOMY  03/18/2022   Procedure: POLYPECTOMY;  Surgeon: Eloise Harman, DO;  [facility];  Service: Endoscopy;;   TONSILLECTOMY      Family History: Family History  Problem Relation Age of Onset   Colon cancer Neg Hx        Pt knows little about family history    Social History:   reports that he has been smoking cigars and cigarettes. He has a 30.00 pack-year smoking history. He has never used smokeless tobacco. He reports current alcohol use of about 6.0 standard drinks of alcohol per week. He reports that he does not use drugs.  Medications: Medications Prior to Admission  Medication Sig Dispense Refill   acetaminophen (TYLENOL) 500 MG tablet Take 2,000 mg by mouth every 6 (six) hours as needed.     albuterol (VENTOLIN HFA) 108 (90 Base) MCG/ACT inhaler Inhale 1 puff into the lungs every 4 (four) hours as needed for wheezing or shortness of breath.     amLODipine (NORVASC) 10 MG tablet Take  1 tablet (10 mg total) by mouth daily. 90 tablet 2   aspirin EC 81 MG tablet Take 1 tablet (81 mg total) by mouth daily with breakfast. 30 tablet 2   atorvastatin (LIPITOR) 20 MG tablet Take 1 tablet (20 mg total) by mouth every evening. 90 tablet 2   esomeprazole (NEXIUM) 40 MG capsule Take 1 capsule (40 mg total) by mouth 2 (two) times daily before a meal. 60 capsule 11   losartan (COZAAR) 50 MG tablet Take 1 tablet (50 mg total) by mouth daily. 90  tablet 3   oxyCODONE-acetaminophen (PERCOCET) 10-325 MG tablet Take 1 tablet by mouth 2 (two) times daily at 10 AM and 5 PM.     traZODone (DESYREL) 100 MG tablet Take 1 tablet (100 mg total) by mouth at bedtime. 90 tablet 2   zolpidem (AMBIEN) 5 MG tablet Take 5 mg by mouth at bedtime as needed for sleep.     busPIRone (BUSPAR) 10 MG tablet Take 1 tablet (10 mg total) by mouth 2 (two) times daily. 180 tablet 3   celecoxib (CELEBREX) 200 MG capsule Take 200 mg by mouth daily.     dicyclomine (BENTYL) 10 MG capsule Take 1 capsule (10 mg total) by mouth 4 (four) times daily -  before meals and at bedtime. 120 capsule 2   meloxicam (MOBIC) 7.5 MG tablet Take 7.5 mg by mouth daily.     sucralfate (CARAFATE) 1 GM/10ML suspension Take 1 g by mouth 4 (four) times daily.     tiotropium (SPIRIVA) 18 MCG inhalation capsule Place 18 mcg into inhaler and inhale daily.      Results for orders placed or performed during the hospital encounter of 08/06/22 (from the past 48 hour(s))  Basic metabolic panel per protocol     Status: Abnormal   Collection Time: 08/04/22  3:19 PM  Result Value Ref Range   Sodium 133 (L) 135 - 145 mmol/L   Potassium 3.9 3.5 - 5.1 mmol/L   Chloride 103 98 - 111 mmol/L   CO2 20 (L) 22 - 32 mmol/L   Glucose, Bld 104 (H) 70 - 99 mg/dL    Comment: Glucose reference range applies only to samples taken after fasting for at least 8 hours.   BUN 6 6 - 20 mg/dL   Creatinine, Ser 0.88 0.61 - 1.24 mg/dL   Calcium 9.2 8.9 - 10.3 mg/dL   GFR, Estimated >60 >60 mL/min    Comment: (NOTE) Calculated using the CKD-EPI Creatinine Equation (2021)    Anion gap 10 5 - 15    Comment: Performed at [facility] [address] [place], [place] [address]    No results found.    Blood pressure (!) 145/87, pulse 98, temperature (!) 97.1 F (36.2 C), temperature source Oral, resp. rate 19, height '5\' 9"'$  (1.753 m), weight 74.8 kg, SpO2 95 %.  General appearance: alert, cooperative, and  appears stated age Head: Normocephalic, without obvious abnormality, atraumatic Neck: supple, symmetrical, trachea midline Extremities: Intact sensation and capillary refill all digits.  +epl/fpl/io.  No wounds.  Pulses: 2+ and symmetric Skin: Skin color, texture, turgor normal. No rashes or lesions Neurologic: Grossly normal Incision/Wound: none  Assessment/Plan Left long finger extensor tendon subluxation and finger tingling.  Plan left long finger extensor tendon centralization and carpal tunnel injection.  Non operative and operative treatment options have been discussed with the patient and patient wishes to proceed with operative treatment. Risks, benefits, and alternatives of surgery have been  discussed and the patient agrees with the plan of care.   Leanora Cover 08/06/2022, 10:22 AM

## 2022-08-06 NOTE — Anesthesia Preprocedure Evaluation (Addendum)
Anesthesia Evaluation  Patient identified by MRN, date of birth, ID band Patient awake    Reviewed: Allergy & Precautions, NPO status , Patient's Chart, lab work & pertinent test results  Airway Mallampati: I  TM Distance: >3 FB Neck ROM: Full    Dental  (+) Missing, Poor Dentition, Dental Advisory Given, Edentulous Upper,    Pulmonary COPD,  COPD inhaler, Current Smoker   breath sounds clear to auscultation       Cardiovascular hypertension, Pt. on medications + CAD   Rhythm:Regular Rate:Normal     Neuro/Psych  PSYCHIATRIC DISORDERS Anxiety Depression    CVA    GI/Hepatic Neg liver ROS,GERD  ,,  Endo/Other  negative endocrine ROS    Renal/GU negative Renal ROS     Musculoskeletal negative musculoskeletal ROS (+)    Abdominal   Peds  Hematology negative hematology ROS (+)   Anesthesia Other Findings   Reproductive/Obstetrics                             Anesthesia Physical Anesthesia Plan  ASA: 3  Anesthesia Plan: General   Post-op Pain Management:    Induction: Intravenous, Rapid sequence and Cricoid pressure planned  PONV Risk Score and Plan: 2 and Ondansetron, Dexamethasone and Midazolam  Airway Management Planned: Oral ETT  Additional Equipment: None  Intra-op Plan:   Post-operative Plan: Extubation in OR  Informed Consent: I have reviewed the patients History and Physical, chart, labs and discussed the procedure including the risks, benefits and alternatives for the proposed anesthesia with the patient or authorized representative who has indicated his/her understanding and acceptance.     Dental advisory given  Plan Discussed with: CRNA  Anesthesia Plan Comments:        Anesthesia Quick Evaluation

## 2022-08-06 NOTE — Op Note (Addendum)
NAME: Gabriel Koch MEDICAL RECORD NO: [id number] DATE OF BIRTH: 13-Jul-1965 FAC[facility] LOCATION: [facility] PHYSICIAN: Tennis Must, MD   OPERATIVE REPORT   DATE OF PROCEDURE: 08/06/22    PREOPERATIVE DIAGNOSIS: Left long finger extensor tendon subluxation and possible carpal tunnel syndrome   POSTOPERATIVE DIAGNOSIS: Left long finger extensor tendon subluxation and possible carpal tunnel syndrome   PROCEDURE:  1.  Left long finger extensor tendon centralization 2.  Left carpal tunnel injection   SURGEON:  Leanora Cover, M.D.   ASSISTANT: Daryll Brod, MD   ANESTHESIA:  General   INTRAVENOUS FLUIDS:  Per anesthesia flow sheet.   ESTIMATED BLOOD LOSS:  Minimal.   COMPLICATIONS:  None.   SPECIMENS:  none   TOURNIQUET TIME:    Total Tourniquet Time Documented: Forearm (Left) - 23 minutes Total: Forearm (Left) - 23 minutes    DISPOSITION:  Stable to PACU.   INDICATIONS: 57 year old male with left long finger extensor tendon subluxation.  This is painful.  He has tried nonoperative treatment measures with splinting without adequate relief.  He wishes to proceed with left long finger extensor tendon centralization.  He also has numbness and tingling in the fingers and wishes to try a carpal tunnel injection.  Risks, benefits and alternatives of surgery were discussed including the risks of blood loss, infection, damage to nerves, vessels, tendons, ligaments, bone for surgery, need for additional surgery, complications with wound healing, continued pain, stiffness, , recurrence.  He voiced understanding of these risks and elected to proceed.  OPERATIVE COURSE:  After being identified preoperatively by myself,  the patient and I agreed on the procedure and site of the procedure.  The surgical site was marked.  Surgical consent had been signed. Preoperative IV antibiotic prophylaxis was given. He was transferred to the operating room and placed on the  operating table in supine position with the left upper extremity on an arm board.  General anesthesia was induced by the anesthesiologist.  Left upper extremity was prepped and draped in normal sterile orthopedic fashion.  A surgical pause was performed between the surgeons, anesthesia, and operating room staff and all were in agreement as to the patient, procedure, and site of procedure.  Injection was performed of the carpal tunnel with a mixture of 1 cc of 1% plain lidocaine and 1 cc of Celestone.  Tourniquet at the proximal aspect of the extremity was inflated to 250 mmHg after exsanguination of the arm with an Esmarch bandage.  Incision was made on the dorsum of the long finger over the MP joint.  This was carried into subcutaneous tissues by spreading technique.  Bipolar electrocautery was used to obtain hemostasis.  There was subluxation of the extensor tendon to the ulnar side of the MP joint.  There was scarring surrounding this.  The scar was freed up and removed.  The ulnar-sided fibers of the extensor tendon were released to allow centralization of the extensor tendon.  A slip of extensor tendon along the ulnar side was harvested creating a distally based tendon stump.  This was then passed through the extensor tendon using the tendon Brader.  It was then passed around the intermetacarpal ligament between the index and long finger metacarpals and then back through the extensor tendon using the tendon Brader.  It was secured using 4-0 Mersilene suture in a horizontal mattress fashion.  It was then secured back onto itself as well.  This was adequate to centralize the extensor tendon.  The  finger was able to be flexed and the tendon stayed centralized.  The wound was copiously irrigated with sterile saline.  It was closed with 4-0 nylon in a horizontal mattress fashion.  It was injected with quarter percent plain Marcaine to aid in postoperative analgesia.  The wound was then dressed with sterile Xeroform  and 4 x 4's and wrapped with a Kerlix bandage.  Volar splint was placed with the MPs and IP's in extension.  This was wrapped with Kerlix and Ace bandage.  The tourniquet was deflated at 23 minutes.  Fingertips were pink with brisk capillary refill after deflation of tourniquet.  The operative  drapes were broken down.  The patient was awoken from anesthesia safely.  He was transferred back to the stretcher and taken to PACU in stable condition.  I will see him back in the office in 1 week for postoperative followup.  I will give him a prescription for oxycodone 5 1-2 tabs PO q6 hours prn pain, dispense # 20.   Leanora Cover, MD Electronically signed, 08/06/22

## 2022-08-06 NOTE — Anesthesia Procedure Notes (Signed)
Procedure Name: Intubation Date/Time: 08/06/2022 11:39 AM  Performed by: Maryella Shivers, CRNAPre-anesthesia Checklist: Patient identified, Emergency Drugs available, Suction available and Patient being monitored Patient Re-evaluated:Patient Re-evaluated prior to induction Oxygen Delivery Method: Circle system utilized Preoxygenation: Pre-oxygenation with 100% oxygen Induction Type: IV induction Ventilation: Mask ventilation without difficulty Laryngoscope Size: Mac and 3 Grade View: Grade II Tube type: Oral Tube size: 7.5 mm Number of attempts: 1 Airway Equipment and Method: Stylet and Oral airway Placement Confirmation: ETT inserted through vocal cords under direct vision, positive ETCO2 and breath sounds checked- equal and bilateral Secured at: 21 cm Tube secured with: Tape Dental Injury: Teeth and Oropharynx as per pre-operative assessment

## 2022-08-06 NOTE — Op Note (Signed)
I assisted Surgeon(s) and Role:    * Leanora Cover, MD - Primary    Daryll Brod, MD - Assisting on the Procedure(s): LEFT LONG FINGER EXTENSOR TENDON CENTRALIZATION LEFT CARPAL TUNNEL INJECTION on 08/06/2022.  I provided assistance on this case as follows: Set up, approach, identification of the extensor tendon loosening of the tendon graft for transfer to reconstruct the sagittal preserve the extensor tendon radial side, transfer of the tendon with suturing and stabilizing the extensor tendon over the metacarpophalangeal joint, closure of the wound application of the dressing and splint.  Electronically signed by: Daryll Brod, MD Date: 08/06/2022 Time: 12:22 PM

## 2022-08-07 ENCOUNTER — Encounter (HOSPITAL_BASED_OUTPATIENT_CLINIC_OR_DEPARTMENT_OTHER): Payer: Self-pay | Admitting: Orthopedic Surgery

## 2022-08-07 NOTE — Progress Notes (Signed)
Left message stating courtesy call and if any questions or concerns please call the doctors office.  

## 2022-09-03 ENCOUNTER — Encounter: Payer: Self-pay | Admitting: Radiology

## 2022-11-03 NOTE — Progress Notes (Deleted)
Referring Provider: Elfredia Nevins, MD Primary Care Physician:  Elfredia Nevins, MD Primary GI Physician: Dr. Marletta Lor  No chief complaint on file.   HPI:   Gabriel Koch is a 57 y.o. male with history of GERD, Barrett's esophagus, duodenal tubular adenoma, adenomatous colon polyps,   EGD 03/18/2022 3 cm hiatal hernia, short segment Barrett's biopsied, gastritis biopsied, duodenal scar/?  Residual polyp tissue that was resected.  Pathology with mild nonspecific reactive gastropathy, negative for H. pylori, duodenal adenoma with low-grade dysplasia, esophageal biopsy consistent with Barrett's without dysplasia.  Colonoscopy 03/18/2022: Nonbleeding internal hemorrhoids, multiple diverticula in the sigmoid colon, segmental area of moderately friable mucosa with spontaneous bleeding in the transverse and ascending colon biopsied.  Pathology with no specific histopathologic change.  Patient has undergone hemorrhoid banding x 3 since his colonoscopy would last banding 05/07/2022.  Past Medical History:  Diagnosis Date   Alcohol abuse    Anxiety and depression    Blood transfusion    Complication of anesthesia    patient hemorrrhaged about 2 weeks after tonsillecotmy   COPD (chronic obstructive pulmonary disease) (HCC)    GERD (gastroesophageal reflux disease)    Hypertension    PTSD (post-traumatic stress disorder)    Stroke (HCC) 2021    Past Surgical History:  Procedure Laterality Date   APPENDECTOMY     BIOPSY  04/02/2020   Procedure: BIOPSY;  Surgeon: Lanelle Bal, DO;  [facility];  Service: Endoscopy;;   BIOPSY  03/18/2022   Procedure: BIOPSY;  Surgeon: Lanelle Bal, DO;  [facility];  Service: Endoscopy;;   COLONOSCOPY WITH PROPOFOL N/A 04/02/2020   Procedure: COLONOSCOPY WITH PROPOFOL;  Surgeon: Lanelle Bal, DO;  [facility];  Service: Endoscopy;  Laterality: N/A;  8:30am   COLONOSCOPY WITH PROPOFOL N/A 03/18/2022    Procedure: COLONOSCOPY WITH PROPOFOL;  Surgeon: Lanelle Bal, DO;  [facility];  Service: Endoscopy;  Laterality: N/A;  1:15 pm, pt knows to arrive at 10:45 can't come earlier due to transportation   ESOPHAGOGASTRODUODENOSCOPY (EGD) WITH PROPOFOL N/A 04/02/2020   Procedure: ESOPHAGOGASTRODUODENOSCOPY (EGD) WITH PROPOFOL;  Surgeon: Lanelle Bal, DO;  [facility];  Service: Endoscopy;  Laterality: N/A;   ESOPHAGOGASTRODUODENOSCOPY (EGD) WITH PROPOFOL N/A 03/21/2021   Procedure: ESOPHAGOGASTRODUODENOSCOPY (EGD) WITH PROPOFOL;  Surgeon: Lanelle Bal, DO;  [facility];  Service: Endoscopy;  Laterality: N/A;  11:00am   ESOPHAGOGASTRODUODENOSCOPY (EGD) WITH PROPOFOL N/A 03/18/2022   Procedure: ESOPHAGOGASTRODUODENOSCOPY (EGD) WITH PROPOFOL;  Surgeon: Lanelle Bal, DO;  [facility];  Service: Endoscopy;  Laterality: N/A;   POLYPECTOMY  04/02/2020   Procedure: POLYPECTOMY;  Surgeon: Lanelle Bal, DO;  [facility];  Service: Endoscopy;;   POLYPECTOMY  03/21/2021   Procedure: POLYPECTOMY;  Surgeon: Lanelle Bal, DO;  [facility];  Service: Endoscopy;;  duodenal   POLYPECTOMY  03/18/2022   Procedure: POLYPECTOMY;  Surgeon: Lanelle Bal, DO;  [facility];  Service: Endoscopy;;   REPAIR EXTENSOR TENDON Left 08/06/2022   Procedure: LEFT LONG FINGER EXTENSOR TENDON CENTRALIZATION;  Surgeon: Betha Loa, MD;  Location: [facility];  Service: Orthopedics;  Laterality: Left;  60 MIN   STERIOD INJECTION Left 08/06/2022   Procedure: LEFT CARPAL TUNNEL INJECTION;  Surgeon: Betha Loa, MD;  Location: [facility];  Service: Orthopedics;  Laterality: Left;   TONSILLECTOMY      Current Outpatient  Medications  Medication Sig Dispense Refill   albuterol (VENTOLIN HFA) 108 (90 Base) MCG/ACT inhaler Inhale 1 puff into the lungs every 4 (four) hours as needed for wheezing or shortness of  breath.     amLODipine (NORVASC) 10 MG tablet Take 1 tablet (10 mg total) by mouth daily. 90 tablet 2   aspirin EC 81 MG tablet Take 1 tablet (81 mg total) by mouth daily with breakfast. 30 tablet 2   atorvastatin (LIPITOR) 20 MG tablet Take 1 tablet (20 mg total) by mouth every evening. 90 tablet 2   busPIRone (BUSPAR) 10 MG tablet Take 1 tablet (10 mg total) by mouth 2 (two) times daily. 180 tablet 3   celecoxib (CELEBREX) 200 MG capsule Take 200 mg by mouth daily.     dicyclomine (BENTYL) 10 MG capsule Take 1 capsule (10 mg total) by mouth 4 (four) times daily -  before meals and at bedtime. 120 capsule 2   esomeprazole (NEXIUM) 40 MG capsule Take 1 capsule (40 mg total) by mouth 2 (two) times daily before a meal. 60 capsule 11   losartan (COZAAR) 50 MG tablet Take 1 tablet (50 mg total) by mouth daily. 90 tablet 3   meloxicam (MOBIC) 7.5 MG tablet Take 7.5 mg by mouth daily.     oxyCODONE (ROXICODONE) 5 MG immediate release tablet 1-2 tabs PO q6 hours prn pain 20 tablet 0   oxyCODONE-acetaminophen (PERCOCET) 10-325 MG tablet Take 1 tablet by mouth 2 (two) times daily at 10 AM and 5 PM.     sucralfate (CARAFATE) 1 GM/10ML suspension Take 1 g by mouth 4 (four) times daily.     tiotropium (SPIRIVA) 18 MCG inhalation capsule Place 18 mcg into inhaler and inhale daily.     traZODone (DESYREL) 100 MG tablet Take 1 tablet (100 mg total) by mouth at bedtime. 90 tablet 2   zolpidem (AMBIEN) 5 MG tablet Take 5 mg by mouth at bedtime as needed for sleep.     No current facility-administered medications for this visit.    Allergies as of 11/05/2022 - Review Complete 08/06/2022  Allergen Reaction Noted   Bee venom Anaphylaxis 09/09/2011    Family History  Problem Relation Age of Onset   Colon cancer Neg Hx        Pt knows little about family history    Social History   Socioeconomic History   Marital status: Legally Separated    Spouse name: Not on file   Number of children: Not on file    Years of education: Not on file   Highest education level: Not on file  Occupational History   Not on file  Tobacco Use   Smoking status: Every Day    Packs/day: 1.00    Years: 30.00    Additional pack years: 0.00    Total pack years: 30.00    Types: Cigars, Cigarettes   Smokeless tobacco: Never  Vaping Use   Vaping Use: Never used  Substance and Sexual Activity   Alcohol use: Yes    Alcohol/week: 6.0 standard drinks of alcohol    Types: 6 Cans of beer per week    Comment: 2-40oz beers per day and liquor   Drug use: No   Sexual activity: Not Currently  Other Topics Concern   Not on file  Social History Narrative   Right handed   Social Determinants of Health   Financial Resource Strain: Not on file  Food Insecurity: Not on file  Transportation Needs:  Not on file  Physical Activity: Not on file  Stress: Not on file  Social Connections: Not on file    Review of Systems: Gen: Denies fever, chills, anorexia. Denies fatigue, weakness, weight loss.  CV: Denies chest pain, palpitations, syncope, peripheral edema, and claudication. Resp: Denies dyspnea at rest, cough, wheezing, coughing up blood, and pleurisy. GI: Denies vomiting blood, jaundice, and fecal incontinence.   Denies dysphagia or odynophagia. Derm: Denies rash, itching, dry skin Psych: Denies depression, anxiety, memory loss, confusion. No homicidal or suicidal ideation.  Heme: Denies bruising, bleeding, and enlarged lymph nodes.  Physical Exam: There were no vitals taken for this visit. General:   Alert and oriented. No distress noted. Pleasant and cooperative.  Head:  Normocephalic and atraumatic. Eyes:  Conjuctiva clear without scleral icterus. Heart:  S1, S2 present without murmurs appreciated. Lungs:  Clear to auscultation bilaterally. No wheezes, rales, or rhonchi. No distress.  Abdomen:  +BS, soft, non-tender and non-distended. No rebound or guarding. No HSM or masses noted. Msk:  Symmetrical without  gross deformities. Normal posture. Extremities:  Without edema. Neurologic:  Alert and  oriented x4 Psych:  Normal mood and affect.    Assessment:     Plan:  ***   [name], PA-C [facility] 11/05/2022

## 2022-11-05 ENCOUNTER — Ambulatory Visit: Payer: Medicaid Other | Admitting: Gastroenterology

## 2022-11-09 ENCOUNTER — Encounter: Payer: Self-pay | Admitting: Gastroenterology

## 2022-11-30 NOTE — Progress Notes (Deleted)
Referring Provider: Elfredia Nevins, MD Primary Care Physician:  Elfredia Nevins, MD Primary GI Physician: Dr. Marletta Lor  No chief complaint on file.   HPI:   Gabriel Koch is a 57 y.o. male  with history of GERD, Barrett's esophagus, duodenal tubular adenoma, adenomatous colon polyps, presenting today for ***  Last follow-up office visit was 03/04/2022 with Dr. Marletta Lor.  Reported intermittent epigastric/RUQ abdominal pain, mild to moderate in severity.  Reported drinking beer to help with the pain, but had cut back to 1 beer daily.  No longer with chronic diarrhea, actually somewhat constipated.  Recommended increasing omeprazole to twice daily, continue Carafate, work on alcohol cessation, arrange EGD and colonoscopy.    EGD 03/18/2022 3 cm hiatal hernia, short segment Barrett's biopsied, gastritis biopsied, duodenal scar/?  Residual polyp tissue that was resected.  Pathology with mild nonspecific reactive gastropathy, negative for H. pylori, duodenal adenoma with low-grade dysplasia, esophageal biopsy consistent with Barrett's without dysplasia. Should have repeat EGD in 5 years.    Colonoscopy 03/18/2022: Nonbleeding internal hemorrhoids, multiple diverticula in the sigmoid colon, segmental area of moderately friable mucosa with spontaneous bleeding in the transverse and ascending colon biopsied.  Pathology with no specific histopathologic change. Recommended 5 year surveillance.    Patient has undergone hemorrhoid banding x 3 since his colonoscopy with last banding 05/07/2022.   Today:     colonoscopy 04/02/2020 with 6-7 tubular adenomas removed, 3-4 which were greater than 10 mm. Recommended 1 year recall. ***  Past Medical History:  Diagnosis Date   Alcohol abuse    Anxiety and depression    Blood transfusion    Complication of anesthesia    patient hemorrrhaged about 2 weeks after tonsillecotmy   COPD (chronic obstructive pulmonary disease) (HCC)    GERD (gastroesophageal  reflux disease)    Hypertension    PTSD (post-traumatic stress disorder)    Stroke (HCC) 2021    Past Surgical History:  Procedure Laterality Date   APPENDECTOMY     BIOPSY  04/02/2020   Procedure: BIOPSY;  Surgeon: Lanelle Bal, DO;  [facility];  Service: Endoscopy;;   BIOPSY  03/18/2022   Procedure: BIOPSY;  Surgeon: Lanelle Bal, DO;  [facility];  Service: Endoscopy;;   COLONOSCOPY WITH PROPOFOL N/A 04/02/2020   Procedure: COLONOSCOPY WITH PROPOFOL;  Surgeon: Lanelle Bal, DO;  [facility];  Service: Endoscopy;  Laterality: N/A;  8:30am   COLONOSCOPY WITH PROPOFOL N/A 03/18/2022   Procedure: COLONOSCOPY WITH PROPOFOL;  Surgeon: Lanelle Bal, DO;  [facility];  Service: Endoscopy;  Laterality: N/A;  1:15 pm, pt knows to arrive at 10:45 can't come earlier due to transportation   ESOPHAGOGASTRODUODENOSCOPY (EGD) WITH PROPOFOL N/A 04/02/2020   Procedure: ESOPHAGOGASTRODUODENOSCOPY (EGD) WITH PROPOFOL;  Surgeon: Lanelle Bal, DO;  [facility];  Service: Endoscopy;  Laterality: N/A;   ESOPHAGOGASTRODUODENOSCOPY (EGD) WITH PROPOFOL N/A 03/21/2021   Procedure: ESOPHAGOGASTRODUODENOSCOPY (EGD) WITH PROPOFOL;  Surgeon: Lanelle Bal, DO;  [facility];  Service: Endoscopy;  Laterality: N/A;  11:00am   ESOPHAGOGASTRODUODENOSCOPY (EGD) WITH PROPOFOL N/A 03/18/2022   Procedure: ESOPHAGOGASTRODUODENOSCOPY (EGD) WITH PROPOFOL;  Surgeon: Lanelle Bal, DO;  [facility];  Service: Endoscopy;  Laterality: N/A;   POLYPECTOMY  04/02/2020   Procedure: POLYPECTOMY;  Surgeon: Lanelle Bal, DO;  [facility];  Service: Endoscopy;;   POLYPECTOMY  03/21/2021   Procedure: POLYPECTOMY;  Surgeon: Earnest Bailey  K, DO;  [facility];  Service: Endoscopy;;  duodenal   POLYPECTOMY  03/18/2022   Procedure: POLYPECTOMY;  Surgeon: Lanelle Bal, DO;  [facility];   Service: Endoscopy;;   REPAIR EXTENSOR TENDON Left 08/06/2022   Procedure: LEFT LONG FINGER EXTENSOR TENDON CENTRALIZATION;  Surgeon: Betha Loa, MD;  Location: [facility];  Service: Orthopedics;  Laterality: Left;  60 MIN   STERIOD INJECTION Left 08/06/2022   Procedure: LEFT CARPAL TUNNEL INJECTION;  Surgeon: Betha Loa, MD;  Location: [facility];  Service: Orthopedics;  Laterality: Left;   TONSILLECTOMY      Current Outpatient Medications  Medication Sig Dispense Refill   albuterol (VENTOLIN HFA) 108 (90 Base) MCG/ACT inhaler Inhale 1 puff into the lungs every 4 (four) hours as needed for wheezing or shortness of breath.     amLODipine (NORVASC) 10 MG tablet Take 1 tablet (10 mg total) by mouth daily. 90 tablet 2   aspirin EC 81 MG tablet Take 1 tablet (81 mg total) by mouth daily with breakfast. 30 tablet 2   atorvastatin (LIPITOR) 20 MG tablet Take 1 tablet (20 mg total) by mouth every evening. 90 tablet 2   busPIRone (BUSPAR) 10 MG tablet Take 1 tablet (10 mg total) by mouth 2 (two) times daily. 180 tablet 3   celecoxib (CELEBREX) 200 MG capsule Take 200 mg by mouth daily.     dicyclomine (BENTYL) 10 MG capsule Take 1 capsule (10 mg total) by mouth 4 (four) times daily -  before meals and at bedtime. 120 capsule 2   esomeprazole (NEXIUM) 40 MG capsule Take 1 capsule (40 mg total) by mouth 2 (two) times daily before a meal. 60 capsule 11   losartan (COZAAR) 50 MG tablet Take 1 tablet (50 mg total) by mouth daily. 90 tablet 3   meloxicam (MOBIC) 7.5 MG tablet Take 7.5 mg by mouth daily.     oxyCODONE (ROXICODONE) 5 MG immediate release tablet 1-2 tabs PO q6 hours prn pain 20 tablet 0   oxyCODONE-acetaminophen (PERCOCET) 10-325 MG tablet Take 1 tablet by mouth 2 (two) times daily at 10 AM and 5 PM.     sucralfate (CARAFATE) 1 GM/10ML suspension Take 1 g by mouth 4 (four) times daily.     tiotropium (SPIRIVA) 18 MCG inhalation capsule Place 18 mcg into inhaler  and inhale daily.     traZODone (DESYREL) 100 MG tablet Take 1 tablet (100 mg total) by mouth at bedtime. 90 tablet 2   zolpidem (AMBIEN) 5 MG tablet Take 5 mg by mouth at bedtime as needed for sleep.     No current facility-administered medications for this visit.    Allergies as of 12/02/2022 - Review Complete 08/06/2022  Allergen Reaction Noted   Bee venom Anaphylaxis 09/09/2011    Family History  Problem Relation Age of Onset   Colon cancer Neg Hx        Pt knows little about family history    Social History   Socioeconomic History   Marital status: Legally Separated    Spouse name: Not on file   Number of children: Not on file   Years of education: Not on file   Highest education level: Not on file  Occupational History   Not on file  Tobacco Use   Smoking status: Every Day    Packs/day: 1.00    Years: 30.00    Additional pack years: 0.00    Total pack years:  30.00    Types: Cigars, Cigarettes   Smokeless tobacco: Never  Vaping Use   Vaping Use: Never used  Substance and Sexual Activity   Alcohol use: Yes    Alcohol/week: 6.0 standard drinks of alcohol    Types: 6 Cans of beer per week    Comment: 2-40oz beers per day and liquor   Drug use: No   Sexual activity: Not Currently  Other Topics Concern   Not on file  Social History Narrative   Right handed   Social Determinants of Health   Financial Resource Strain: Not on file  Food Insecurity: Not on file  Transportation Needs: Not on file  Physical Activity: Not on file  Stress: Not on file  Social Connections: Not on file    Review of Systems: Gen: Denies fever, chills, anorexia. Denies fatigue, weakness, weight loss.  CV: Denies chest pain, palpitations, syncope, peripheral edema, and claudication. Resp: Denies dyspnea at rest, cough, wheezing, coughing up blood, and pleurisy. GI: Denies vomiting blood, jaundice, and fecal incontinence.   Denies dysphagia or odynophagia. Derm: Denies rash,  itching, dry skin Psych: Denies depression, anxiety, memory loss, confusion. No homicidal or suicidal ideation.  Heme: Denies bruising, bleeding, and enlarged lymph nodes.  Physical Exam: There were no vitals taken for this visit. General:   Alert and oriented. No distress noted. Pleasant and cooperative.  Head:  Normocephalic and atraumatic. Eyes:  Conjuctiva clear without scleral icterus. Heart:  S1, S2 present without murmurs appreciated. Lungs:  Clear to auscultation bilaterally. No wheezes, rales, or rhonchi. No distress.  Abdomen:  +BS, soft, non-tender and non-distended. No rebound or guarding. No HSM or masses noted. Msk:  Symmetrical without gross deformities. Normal posture. Extremities:  Without edema. Neurologic:  Alert and  oriented x4 Psych:  Normal mood and affect.    Assessment:     Plan:  ***   [name], PA-C [facility] 12/02/2022

## 2022-12-01 ENCOUNTER — Other Ambulatory Visit (HOSPITAL_COMMUNITY): Payer: Self-pay | Admitting: Internal Medicine

## 2022-12-01 DIAGNOSIS — M79671 Pain in right foot: Secondary | ICD-10-CM

## 2022-12-01 DIAGNOSIS — R1012 Left upper quadrant pain: Secondary | ICD-10-CM

## 2022-12-02 ENCOUNTER — Ambulatory Visit: Payer: Medicaid Other | Admitting: Gastroenterology

## 2022-12-11 ENCOUNTER — Ambulatory Visit (HOSPITAL_COMMUNITY)
Admission: RE | Admit: 2022-12-11 | Discharge: 2022-12-11 | Disposition: A | Discharge status: Home / Self Care | Payer: Medicaid Other | Source: Ambulatory Visit | Attending: Internal Medicine | Admitting: Internal Medicine

## 2022-12-11 DIAGNOSIS — M79672 Pain in left foot: Secondary | ICD-10-CM | POA: Insufficient documentation

## 2022-12-11 DIAGNOSIS — M79671 Pain in right foot: Secondary | ICD-10-CM | POA: Insufficient documentation

## 2022-12-22 NOTE — Progress Notes (Signed)
Referring Provider: Elfredia Nevins, MD Primary Care Physician:  Elfredia Nevins, MD Primary GI Physician: Dr. Marletta Lor  Chief Complaint  Patient presents with   Follow-up    Having some nausea and vomiting. Knot on the left side and it hurts.     HPI:   Gabriel Koch is a 57 y.o. male  with history of GERD, Barrett's esophagus, duodenal tubular adenoma, adenomatous colon polyps, presenting today with multiple concerns including weight loss, abdominal pain, vomiting, diarrhea, rectal bleeding, left rib pain.   Last follow-up office visit was 03/04/2022 with Dr. Marletta Lor.  Reported intermittent epigastric/RUQ abdominal pain, mild to moderate in severity.  Reported drinking beer to help with the pain, but had cut back to 1 beer daily.  No longer with chronic diarrhea, actually somewhat constipated.  Recommended increasing omeprazole to twice daily, continue Carafate, work on alcohol cessation, arrange EGD and colonoscopy.     EGD 03/18/2022 3 cm hiatal hernia, short segment Barrett's biopsied, gastritis biopsied, duodenal scar/?  Residual polyp tissue that was resected.  Pathology with mild nonspecific reactive gastropathy, negative for H. pylori, duodenal adenoma with low-grade dysplasia, esophageal biopsy consistent with Barrett's without dysplasia. Should have repeat EGD in 5 years.    Colonoscopy 03/18/2022: Nonbleeding internal hemorrhoids, multiple diverticula in the sigmoid colon, segmental area of moderately friable mucosa with spontaneous bleeding in the transverse and ascending colon biopsied.  Pathology with no specific histopathologic change. Recommended 5 year surveillance.    Patient has undergone hemorrhoid banding x 3 since his colonoscopy with last banding 05/07/2022.     Today: Down 12 lbs in the last 4 months unintentionally.  1-2 episodes of vomiting in the morning. This has been going on for several years. Reports it resolved for a while, but started back sometime last  year after his procedures. No vomiting during the day, just when waking in the morning. Vomiting tends to be triggered by coughing. Then states, he wakes up, has to go to the bathroom, get hot and sweaty, and ends up vomiting.   Not eating well during the day because his stomach feels bloated. Usually only eating supper.   GERD is well controlled on Nexium BID.   Reports something has moved on the left side that isn't supposed to.  Has constant left sided pain, every day all day. Not affected by meals. Hurts worse with certain movements. Can't lay on the left side. Was first told he had 3 cracked ribs. Denies falling. States the cracked ribs were from coughing. Started mid last year. Can't sleep on the left side.   Taking a lot of tylenol daily due to pain. Drinks a couple beer as well to help with the pain.    Having diarrhea. Yellow, green, sometimes black. Occasionally takes pepto and notices dark stools after this. Bms are liquid. Started after having hemorrhoid banding. Urgent. 8-9 times a day. Reports nocturnal stools. Has some intermittent rectal bleeding on toilet tissue and in water. This resolved after hemorrhoids banding, but started back the last month. Unable to tell me how frequent. Occurs when straining. Small amount. Not taking dicyclomine. Reports hot flashes with Bms.    Was on antibiotics when he had his teeth pulled in April.   Not sure if he is taking meloxicam.  No other NSAIDs.   Past Medical History:  Diagnosis Date   Alcohol abuse    Anxiety and depression    Blood transfusion    Complication of anesthesia    patient  hemorrrhaged about 2 weeks after tonsillecotmy   COPD (chronic obstructive pulmonary disease) (HCC)    GERD (gastroesophageal reflux disease)    Hypertension    PTSD (post-traumatic stress disorder)    Stroke (HCC) 2021    Past Surgical History:  Procedure Laterality Date   APPENDECTOMY     BIOPSY  04/02/2020   Procedure: BIOPSY;  Surgeon:  Lanelle Bal, DO;  [facility];  Service: Endoscopy;;   BIOPSY  03/18/2022   Procedure: BIOPSY;  Surgeon: Lanelle Bal, DO;  [facility];  Service: Endoscopy;;   COLONOSCOPY WITH PROPOFOL N/A 04/02/2020   Procedure: COLONOSCOPY WITH PROPOFOL;  Surgeon: Lanelle Bal, DO;  [facility];  Service: Endoscopy;  Laterality: N/A;  8:30am   COLONOSCOPY WITH PROPOFOL N/A 03/18/2022   Procedure: COLONOSCOPY WITH PROPOFOL;  Surgeon: Lanelle Bal, DO;  [facility];  Service: Endoscopy;  Laterality: N/A;  1:15 pm, pt knows to arrive at 10:45 can't come earlier due to transportation   ESOPHAGOGASTRODUODENOSCOPY (EGD) WITH PROPOFOL N/A 04/02/2020   Procedure: ESOPHAGOGASTRODUODENOSCOPY (EGD) WITH PROPOFOL;  Surgeon: Lanelle Bal, DO;  [facility];  Service: Endoscopy;  Laterality: N/A;   ESOPHAGOGASTRODUODENOSCOPY (EGD) WITH PROPOFOL N/A 03/21/2021   Procedure: ESOPHAGOGASTRODUODENOSCOPY (EGD) WITH PROPOFOL;  Surgeon: Lanelle Bal, DO;  [facility];  Service: Endoscopy;  Laterality: N/A;  11:00am   ESOPHAGOGASTRODUODENOSCOPY (EGD) WITH PROPOFOL N/A 03/18/2022   Procedure: ESOPHAGOGASTRODUODENOSCOPY (EGD) WITH PROPOFOL;  Surgeon: Lanelle Bal, DO;  [facility];  Service: Endoscopy;  Laterality: N/A;   POLYPECTOMY  04/02/2020   Procedure: POLYPECTOMY;  Surgeon: Lanelle Bal, DO;  [facility];  Service: Endoscopy;;   POLYPECTOMY  03/21/2021   Procedure: POLYPECTOMY;  Surgeon: Lanelle Bal, DO;  [facility];  Service: Endoscopy;;  duodenal   POLYPECTOMY  03/18/2022   Procedure: POLYPECTOMY;  Surgeon: Lanelle Bal, DO;  [facility];  Service: Endoscopy;;   REPAIR EXTENSOR TENDON Left 08/06/2022   Procedure: LEFT LONG FINGER EXTENSOR TENDON CENTRALIZATION;  Surgeon: Betha Loa, MD;  Location: [facility];  Service: Orthopedics;  Laterality: Left;   60 MIN   STERIOD INJECTION Left 08/06/2022   Procedure: LEFT CARPAL TUNNEL INJECTION;  Surgeon: Betha Loa, MD;  Location: [facility];  Service: Orthopedics;  Laterality: Left;   TONSILLECTOMY      Current Outpatient Medications  Medication Sig Dispense Refill   albuterol (VENTOLIN HFA) 108 (90 Base) MCG/ACT inhaler Inhale 1 puff into the lungs every 4 (four) hours as needed for wheezing or shortness of breath.     buPROPion (WELLBUTRIN) 75 MG tablet Take 75 mg by mouth 2 (two) times daily.     busPIRone (BUSPAR) 10 MG tablet Take 1 tablet (10 mg total) by mouth 2 (two) times daily. 180 tablet 3   celecoxib (CELEBREX) 200 MG capsule Take 200 mg by mouth daily.     esomeprazole (NEXIUM) 40 MG capsule Take 1 capsule (40 mg total) by mouth 2 (two) times daily before a meal. 60 capsule 11   folic acid (FOLVITE) 1 MG tablet 1 tablet Orally Once a day for 90 days     hydrocortisone (ANUSOL-HC) 2.5 % rectal cream Place 1 Application rectally 2 (two) times daily. 30 g 1   losartan (COZAAR) 50 MG tablet Take 1 tablet (50 mg total) by mouth daily. 90 tablet 3   traZODone (DESYREL)  100 MG tablet Take 1 tablet (100 mg total) by mouth at bedtime. 90 tablet 2   zolpidem (AMBIEN) 5 MG tablet Take 5 mg by mouth at bedtime as needed for sleep.     famotidine (PEPCID) 20 MG tablet Take 1 tablet (20 mg total) by mouth at bedtime. 30 tablet 3   tiotropium (SPIRIVA) 18 MCG inhalation capsule Place 18 mcg into inhaler and inhale daily. (Patient not taking: Reported on 12/24/2022)     No current facility-administered medications for this visit.    Allergies as of 12/24/2022 - Review Complete 12/24/2022  Allergen Reaction Noted   Bee venom Anaphylaxis 09/09/2011    Family History  Problem Relation Age of Onset   Colon cancer Neg Hx        Pt knows little about family history    Social History   Socioeconomic History   Marital status: Legally Separated    Spouse name: Not on file    Number of children: Not on file   Years of education: Not on file   Highest education level: Not on file  Occupational History   Not on file  Tobacco Use   Smoking status: Every Day    Packs/day: 1.00    Years: 30.00    Additional pack years: 0.00    Total pack years: 30.00    Types: Cigars, Cigarettes   Smokeless tobacco: Never  Vaping Use   Vaping Use: Never used  Substance and Sexual Activity   Alcohol use: Yes    Alcohol/week: 2.0 standard drinks of alcohol    Types: 2 Cans of beer per week    Comment: 2-40oz beers per day and liquor; down to 2 12 oz beer daily   Drug use: No   Sexual activity: Not Currently  Other Topics Concern   Not on file  Social History Narrative   Right handed   Social Determinants of Health   Financial Resource Strain: Not on file  Food Insecurity: Not on file  Transportation Needs: Not on file  Physical Activity: Not on file  Stress: Not on file  Social Connections: Not on file    Review of Systems: Gen: Denies fever, chills, cold or flulike symptoms, presyncope/syncope. CV: Denies chest pain, palpitations. Resp: Denies dyspnea at rest, cough. GI: See HPI Heme: See HPI  Physical Exam: BP 126/82 (BP Location: Right Arm, Patient Position: Sitting, Cuff Size: Normal)   Pulse 98   Temp 97.7 F (36.5 C) (Temporal)   Ht 5\' 9"  (1.753 m)   Wt 152 lb 9.6 oz (69.2 kg)   SpO2 97%   BMI 22.54 kg/m  General:   Alert and oriented. No distress noted. Pleasant and cooperative.  Head:  Normocephalic and atraumatic. Eyes:  Conjuctiva clear without scleral icterus. Heart:  S1, S2 present without murmurs appreciated. Lungs:  Clear to auscultation bilaterally. No wheezes, rales, or rhonchi. No distress.  Abdomen:  +BS, soft, and non-distended. Mild generalized TTP, greatest in LLQ, RLQ. No rebound or guarding. No HSM or masses noted. Msk:  Symmetrical without gross deformities. Normal posture. Extremities:  Without edema. Neurologic:  Alert and   oriented x4 Psych:  Normal mood and affect.    Assessment:  57 year old male with history of COPD, HTN, PTSD, stroke, anxiety/depression, alcohol abuse, GERD, Barrett's esophagus, duodenal tubular adenoma, adenomatous colon polyps, presenting today with multiple GI complaints including weight loss, abdominal pain, abdominal bloating, vomiting, diarrhea, rectal bleeding.  Also reporting left rib pain.  Generalized abdominal pain/bloating/diarrhea/unintentional weight  loss: Chronic diarrhea previously resolved, but now with recurrence for the last 6+ months with watery, urgent diarrhea with 8 or 9 bowel movements daily, nocturnal stools. Also with worsening of his chronic abdominal pain/abdominal bloating which has led to decreased p.o. intake and unintentional weight loss of 12 pounds in the last 4 months.  Also reporting hot flashes with diarrhea in the morning with associated vomiting. He is not sure if he is taking meloxicam. He has continued to drink a couple beer daily. EGD and colonoscopy last year in September with 3 cm hiatal hernia, short segment Barrett's, gastritis biopsied, duodenal scar/?  Residual polyp tissue resected.  Pathology with mild nonspecific reactive gastropathy, negative for H. pylori, duodenal adenoma with low-grade dysplasia. Colonoscopy with multiple diverticula in the sigmoid colon, segmental area of moderately friable mucosa with spontaneous bleeding in the transverse and ascending colon biopsied.  Pathology with no specific histopathologic change. On exam today, he has mild generalized tenderness to palpation, greatest in the left lower quadrant and right lower quadrant.  Etiology of his symptoms is not clear and likely multifactorial.  Differential is broad including infectious diarrhea/colitis, diverticulitis, occult malignancy, carcinoid, IBD, pancreatitis, gastritis, duodenitis, biliary etiology. Will update labs/stool studies and arrange CT.   Morning  vomiting: Chronic. Primarily post tussive. Recently developing vomiting with morning bowel movements in the setting of hot flashes. May have PND and/or atypical nocturnal GERD contributing to morning cough. Recommended adding famotidine at bedtime. Evaluation of diarrheal related symptoms discussed above  Left rib pain/deformity: Present for several months. Denies a fall. On exam left lower rib cage looks grossly different than right with larger gap between the lower ribs. Will obtain CXR.   GERD: Well controlled on Nexium BID.   Rectal bleeding:  Previously resolved with hemorrhoid bandings last year (September-November)  but returned last month. Likely secondary to recurrent hemorrhoids, but unable to rule out colitis/Infectious diarrhea contributing, doubt IBD. Colonoscopy 03/18/2022: Nonbleeding internal hemorrhoids, multiple diverticula in the sigmoid colon, segmental area of moderately friable mucosa with spontaneous bleeding in the transverse and ascending colon biopsied.  Pathology with no specific histopathologic change. I am checking stool studies/labs. Also provided Rx for anusol rectal cream.   Anemia:  Chronic. Hgb stable. Likely secondary to B12 deficiency, but no iron panel on file. Will check this.    Plan:  CBC, CMP, Lipase, Iron panel, 5 HIAA, GI path panel, C diff GDH and Toxin A/B, giardia, cryptosporidium. CT A/P with contrast ASAP DG chest Continue Nexium 40 mg BID Start pepcid 20 mg at bedtime Anusol rectal cream BID x 7 days prn.  Avoid alcohol Avoid NSAIDs Further recommendations to follow.    [name], PA-C [facility] 12/24/2022

## 2022-12-24 ENCOUNTER — Encounter: Payer: Self-pay | Admitting: Gastroenterology

## 2022-12-24 ENCOUNTER — Encounter: Payer: Self-pay | Admitting: *Deleted

## 2022-12-24 ENCOUNTER — Other Ambulatory Visit: Payer: Self-pay

## 2022-12-24 ENCOUNTER — Emergency Department (HOSPITAL_COMMUNITY)
Admission: EM | Admit: 2022-12-24 | Discharge: 2022-12-24 | Discharge status: Against Medical Advice | Payer: Medicaid Other | Attending: Emergency Medicine | Admitting: Emergency Medicine

## 2022-12-24 ENCOUNTER — Ambulatory Visit (INDEPENDENT_AMBULATORY_CARE_PROVIDER_SITE_OTHER): Payer: Medicaid Other | Admitting: Gastroenterology

## 2022-12-24 ENCOUNTER — Encounter (HOSPITAL_COMMUNITY): Payer: Self-pay

## 2022-12-24 VITALS — BP 126/82 | HR 98 | Temp 97.7°F | Ht 69.0 in | Wt 152.6 lb

## 2022-12-24 DIAGNOSIS — R109 Unspecified abdominal pain: Secondary | ICD-10-CM

## 2022-12-24 DIAGNOSIS — R634 Abnormal weight loss: Secondary | ICD-10-CM | POA: Diagnosis not present

## 2022-12-24 DIAGNOSIS — R799 Abnormal finding of blood chemistry, unspecified: Secondary | ICD-10-CM | POA: Insufficient documentation

## 2022-12-24 DIAGNOSIS — K219 Gastro-esophageal reflux disease without esophagitis: Secondary | ICD-10-CM

## 2022-12-24 DIAGNOSIS — R1084 Generalized abdominal pain: Secondary | ICD-10-CM | POA: Diagnosis not present

## 2022-12-24 DIAGNOSIS — E871 Hypo-osmolality and hyponatremia: Secondary | ICD-10-CM | POA: Diagnosis present

## 2022-12-24 DIAGNOSIS — D649 Anemia, unspecified: Secondary | ICD-10-CM

## 2022-12-24 DIAGNOSIS — R197 Diarrhea, unspecified: Secondary | ICD-10-CM | POA: Diagnosis not present

## 2022-12-24 DIAGNOSIS — Z5321 Procedure and treatment not carried out due to patient leaving prior to being seen by health care provider: Secondary | ICD-10-CM | POA: Insufficient documentation

## 2022-12-24 DIAGNOSIS — K625 Hemorrhage of anus and rectum: Secondary | ICD-10-CM

## 2022-12-24 DIAGNOSIS — R112 Nausea with vomiting, unspecified: Secondary | ICD-10-CM

## 2022-12-24 DIAGNOSIS — R232 Flushing: Secondary | ICD-10-CM

## 2022-12-24 DIAGNOSIS — M954 Acquired deformity of chest and rib: Secondary | ICD-10-CM

## 2022-12-24 MED ORDER — HYDROCORTISONE (PERIANAL) 2.5 % EX CREA
1.0000 | TOPICAL_CREAM | Freq: Two times a day (BID) | CUTANEOUS | 1 refills | Status: DC
Start: 2022-12-24 — End: 2023-01-18

## 2022-12-24 MED ORDER — FAMOTIDINE 20 MG PO TABS
20.0000 mg | ORAL_TABLET | Freq: Every day | ORAL | 3 refills | Status: DC
Start: 2022-12-24 — End: 2023-01-18

## 2022-12-24 NOTE — ED Triage Notes (Signed)
Pt was in triage and stated he did not want to be seen and he would see Dr. Durene Cal tomorrow.

## 2022-12-24 NOTE — ED Triage Notes (Signed)
Pt report he was sent here from a MD office for low sodium and "something on my left side is moving around that's not supposed to be".

## 2022-12-24 NOTE — Patient Instructions (Addendum)
Please have blood work completed at [facility].   Have Chest x ray completed at [facility].   We will arrange to have a CT of your abdomen and pelvis at [facility].  This will replace the CT that your primary doctor has arranged for you in July.  Continue Nexium 40 mg twice daily 30 minutes before breakfast and dinner.  Start Pepcid 20 mg at bedtime to see if this will help with your morning vomiting.  Follow a GERD diet:  Avoid fried, fatty, greasy, spicy, citrus foods. Avoid caffeine and carbonated beverages. Avoid chocolate. Try eating 4-6 small meals a day rather than 3 large meals. Do not eat within 3 hours of laying down. Prop head of bed up on wood or bricks to create a 6 inch incline.  Avoid alcohol and all NSAID products.   Use Anusol rectal cream twice daily for 7 days at a time as needed for rectal bleeding related to hemorrhoids.  We will decide on follow-up after your results.   [name], PA-C [facility]

## 2022-12-24 NOTE — Progress Notes (Signed)
Pt left prior to be given appt for CT

## 2022-12-27 ENCOUNTER — Encounter: Payer: Self-pay | Admitting: Gastroenterology

## 2022-12-27 DIAGNOSIS — D649 Anemia, unspecified: Secondary | ICD-10-CM | POA: Insufficient documentation

## 2022-12-27 DIAGNOSIS — R634 Abnormal weight loss: Secondary | ICD-10-CM | POA: Insufficient documentation

## 2022-12-27 DIAGNOSIS — R1084 Generalized abdominal pain: Secondary | ICD-10-CM | POA: Insufficient documentation

## 2022-12-27 DIAGNOSIS — D61818 Other pancytopenia: Secondary | ICD-10-CM | POA: Insufficient documentation

## 2022-12-27 DIAGNOSIS — M954 Acquired deformity of chest and rib: Secondary | ICD-10-CM | POA: Insufficient documentation

## 2022-12-27 DIAGNOSIS — R197 Diarrhea, unspecified: Secondary | ICD-10-CM | POA: Insufficient documentation

## 2022-12-28 ENCOUNTER — Ambulatory Visit (HOSPITAL_COMMUNITY): Payer: Medicaid Other

## 2022-12-28 ENCOUNTER — Other Ambulatory Visit: Payer: Self-pay | Admitting: Gastroenterology

## 2022-12-28 ENCOUNTER — Telehealth (INDEPENDENT_AMBULATORY_CARE_PROVIDER_SITE_OTHER): Payer: Self-pay

## 2022-12-28 NOTE — Telephone Encounter (Signed)
I called the patient and made him aware that his blood and stool [facility] work was ordered for Gabriel Koch at [facility]. He states he will go to the [facility] this week when he has transportation as he will be out to see pcp. He had two Ct Scans scheduled and the one [name] Pa- C had scheduled was cancelled due to the machine being down. Patient would like a call back as to when it will be rescheduled and if he has to drink anything. Please advise. Thanks    [id number] - pt left a message for our office to call him back about a CT scan he is to have.  He was asking questions on the message about "is there something he is supposed to drink".  Can you call him and let him know of there are special instructions before this procedure.  27 mins Caroleen Hamman, NT sure I will reach out now thanks   1  22 mins JC ok looks like the issue was he had the same exam ordered by two different MD's he also is saying he is supp to be having blood work but hasn't heard anything about that  16 mins AM Michail Sermon ok I will get the nurse to call him.  15 mins JC Caroleen Hamman, NT ok perfect thanks   15 mins You were added by Michail Sermon. 14 mins AM Michail Sermon See above notes..... I messaged the lady that had scheduled the patient for the CT scan please read her replies  1  13 mins this may have to go to the schedulers here   who is the patient  12 mins JC Caroleen Hamman, NT he is scheduled for 7/19  12 mins AM Milana Obey B. Sochacki  12 mins ok, I will call him,but I think it will probably have to go to the scheduler  11 mins I called and left a message, as he did not pick up   1  8 mins JC Caroleen Hamman, NT ok perfect he may have still been on the phone with me. He seemed confused with so many docs and what he needs   4 mins he just called back, He is aware he does need to complete the stool and labs at [facility]. He says he has an appt with pcp this week and he will go by the [facility]   3 mins AM Michail Sermon Thanks to both of you for taking care of his needs.  2 mins JC Caroleen Hamman, NT ok perfect thanks. I think he is just confused with so many appts but he had two CT's scheduled that were same exam

## 2023-01-01 ENCOUNTER — Other Ambulatory Visit: Payer: Self-pay | Admitting: Internal Medicine

## 2023-01-01 ENCOUNTER — Other Ambulatory Visit: Payer: Self-pay | Admitting: Gastroenterology

## 2023-01-02 LAB — IRON AND TIBC
Iron Saturation: 20 % (ref 15–55)
Iron: 82 ug/dL (ref 38–169)
Total Iron Binding Capacity: 406 ug/dL (ref 250–450)
UIBC: 324 ug/dL (ref 111–343)

## 2023-01-02 LAB — LIPASE: Lipase: 73 U/L (ref 13–78)

## 2023-01-02 LAB — FERRITIN: Ferritin: 53 ng/mL (ref 30–400)

## 2023-01-05 ENCOUNTER — Other Ambulatory Visit: Payer: Self-pay | Admitting: Gastroenterology

## 2023-01-07 LAB — GI PROFILE, STOOL, PCR

## 2023-01-07 LAB — GIARDIA/CRYPTOSPORIDIUM EIA
Cryptosporidium EIA: NEGATIVE
Giardia Ag, Stl: NEGATIVE

## 2023-01-07 LAB — 5 HIAA, QUANTITATIVE, URINE, 24 HOUR

## 2023-01-07 LAB — CLOSTRIDIUM DIFFICILE EIA: C difficile Toxins A+B, EIA: NEGATIVE

## 2023-01-08 ENCOUNTER — Ambulatory Visit (HOSPITAL_COMMUNITY): Payer: MEDICAID

## 2023-01-11 ENCOUNTER — Encounter (HOSPITAL_COMMUNITY): Payer: Self-pay | Admitting: Emergency Medicine

## 2023-01-11 ENCOUNTER — Telehealth: Payer: Self-pay | Admitting: Gastroenterology

## 2023-01-11 ENCOUNTER — Other Ambulatory Visit: Payer: Self-pay

## 2023-01-11 ENCOUNTER — Emergency Department (HOSPITAL_COMMUNITY): Payer: MEDICAID

## 2023-01-11 ENCOUNTER — Inpatient Hospital Stay (HOSPITAL_COMMUNITY)
Admission: EM | Admit: 2023-01-11 | Discharge: 2023-01-18 | DRG: 392 | Disposition: A | Discharge status: Home / Self Care | Payer: MEDICAID | Attending: Family Medicine | Admitting: Family Medicine

## 2023-01-11 DIAGNOSIS — Z720 Tobacco use: Secondary | ICD-10-CM

## 2023-01-11 DIAGNOSIS — F4310 Post-traumatic stress disorder, unspecified: Secondary | ICD-10-CM | POA: Diagnosis present

## 2023-01-11 DIAGNOSIS — F1010 Alcohol abuse, uncomplicated: Secondary | ICD-10-CM | POA: Diagnosis not present

## 2023-01-11 DIAGNOSIS — F10231 Alcohol dependence with withdrawal delirium: Secondary | ICD-10-CM | POA: Diagnosis present

## 2023-01-11 DIAGNOSIS — F419 Anxiety disorder, unspecified: Secondary | ICD-10-CM | POA: Diagnosis present

## 2023-01-11 DIAGNOSIS — Z8673 Personal history of transient ischemic attack (TIA), and cerebral infarction without residual deficits: Secondary | ICD-10-CM | POA: Diagnosis not present

## 2023-01-11 DIAGNOSIS — F32A Depression, unspecified: Secondary | ICD-10-CM | POA: Diagnosis present

## 2023-01-11 DIAGNOSIS — E876 Hypokalemia: Secondary | ICD-10-CM

## 2023-01-11 DIAGNOSIS — E871 Hypo-osmolality and hyponatremia: Secondary | ICD-10-CM | POA: Diagnosis not present

## 2023-01-11 DIAGNOSIS — Z79899 Other long term (current) drug therapy: Secondary | ICD-10-CM | POA: Diagnosis not present

## 2023-01-11 DIAGNOSIS — Y900 Blood alcohol level of less than 20 mg/100 ml: Secondary | ICD-10-CM | POA: Diagnosis present

## 2023-01-11 DIAGNOSIS — J449 Chronic obstructive pulmonary disease, unspecified: Secondary | ICD-10-CM | POA: Diagnosis present

## 2023-01-11 DIAGNOSIS — I10 Essential (primary) hypertension: Secondary | ICD-10-CM | POA: Diagnosis not present

## 2023-01-11 DIAGNOSIS — K625 Hemorrhage of anus and rectum: Secondary | ICD-10-CM

## 2023-01-11 DIAGNOSIS — K529 Noninfective gastroenteritis and colitis, unspecified: Secondary | ICD-10-CM | POA: Diagnosis not present

## 2023-01-11 DIAGNOSIS — F17210 Nicotine dependence, cigarettes, uncomplicated: Secondary | ICD-10-CM | POA: Diagnosis present

## 2023-01-11 DIAGNOSIS — K219 Gastro-esophageal reflux disease without esophagitis: Secondary | ICD-10-CM | POA: Diagnosis present

## 2023-01-11 DIAGNOSIS — Z91030 Bee allergy status: Secondary | ICD-10-CM | POA: Diagnosis not present

## 2023-01-11 LAB — COMPREHENSIVE METABOLIC PANEL
ALT: 37 U/L (ref 0–44)
AST: 58 U/L — ABNORMAL HIGH (ref 15–41)
Albumin: 3.6 g/dL (ref 3.5–5.0)
Alkaline Phosphatase: 77 U/L (ref 38–126)
Anion gap: 13 (ref 5–15)
BUN: 9 mg/dL (ref 6–20)
CO2: 19 mmol/L — ABNORMAL LOW (ref 22–32)
Calcium: 8.5 mg/dL — ABNORMAL LOW (ref 8.9–10.3)
Chloride: 81 mmol/L — ABNORMAL LOW (ref 98–111)
Creatinine, Ser: 1.17 mg/dL (ref 0.61–1.24)
GFR, Estimated: >60 mL/min (ref >60)
Glucose, Bld: 101 mg/dL — ABNORMAL HIGH (ref 70–99)
Potassium: 3 mmol/L — ABNORMAL LOW (ref 3.5–5.1)
Sodium: 113 mmol/L — CL (ref 135–145)
Total Bilirubin: 0.6 mg/dL (ref 0.3–1.2)
Total Protein: 7.3 g/dL (ref 6.5–8.1)

## 2023-01-11 LAB — BASIC METABOLIC PANEL
Anion gap: 10 (ref 5–15)
BUN: 8 mg/dL (ref 6–20)
CO2: 21 mmol/L — ABNORMAL LOW (ref 22–32)
Calcium: 8.1 mg/dL — ABNORMAL LOW (ref 8.9–10.3)
Chloride: 87 mmol/L — ABNORMAL LOW (ref 98–111)
Creatinine, Ser: 1.16 mg/dL (ref 0.61–1.24)
GFR, Estimated: >60 mL/min (ref >60)
Glucose, Bld: 90 mg/dL (ref 70–99)
Potassium: 3.2 mmol/L — ABNORMAL LOW (ref 3.5–5.1)
Sodium: 118 mmol/L — CL (ref 135–145)

## 2023-01-11 LAB — C DIFFICILE QUICK SCREEN W PCR REFLEX
C Diff antigen: NEGATIVE
C Diff interpretation: NOT DETECTED
C Diff toxin: NEGATIVE

## 2023-01-11 LAB — URINALYSIS, ROUTINE W REFLEX MICROSCOPIC
Bilirubin Urine: NEGATIVE
Glucose, UA: NEGATIVE mg/dL
Hgb urine dipstick: NEGATIVE
Ketones, ur: NEGATIVE mg/dL
Leukocytes,Ua: NEGATIVE
Nitrite: NEGATIVE
Protein, ur: NEGATIVE mg/dL
Specific Gravity, Urine: 1.011 (ref 1.005–1.030)
pH: 6 (ref 5.0–8.0)

## 2023-01-11 LAB — CBC
HCT: 32.3 % — ABNORMAL LOW (ref 39.0–52.0)
Hemoglobin: 11.6 g/dL — ABNORMAL LOW (ref 13.0–17.0)
MCH: 35.5 pg — ABNORMAL HIGH (ref 26.0–34.0)
MCHC: 35.9 g/dL (ref 30.0–36.0)
MCV: 98.8 fL (ref 80.0–100.0)
Platelets: 158 10*3/uL (ref 150–400)
RBC: 3.27 MIL/uL — ABNORMAL LOW (ref 4.22–5.81)
RDW: 14.4 % (ref 11.5–15.5)
WBC: 5.1 10*3/uL (ref 4.0–10.5)
nRBC: 0 % (ref 0.0–0.2)

## 2023-01-11 LAB — MAGNESIUM: Magnesium: 1.4 mg/dL — ABNORMAL LOW (ref 1.7–2.4)

## 2023-01-11 LAB — ETHANOL: Alcohol, Ethyl (B): 19 mg/dL — ABNORMAL HIGH (ref <10)

## 2023-01-11 LAB — LIPASE, BLOOD: Lipase: 89 U/L — ABNORMAL HIGH (ref 11–51)

## 2023-01-11 MED ORDER — LOPERAMIDE HCL 2 MG PO CAPS
2.0000 mg | ORAL_CAPSULE | Freq: Four times a day (QID) | ORAL | Status: DC | PRN
  Administered 2023-01-11 – 2023-01-16 (×11): 2 mg via ORAL
  Filled 2023-01-11 (×11): qty 1

## 2023-01-11 MED ORDER — POTASSIUM CHLORIDE CRYS ER 20 MEQ PO TBCR
40.0000 meq | EXTENDED_RELEASE_TABLET | ORAL | Status: AC
  Administered 2023-01-11 – 2023-01-12 (×2): 40 meq via ORAL
  Filled 2023-01-11 (×2): qty 2

## 2023-01-11 MED ORDER — THIAMINE MONONITRATE 100 MG PO TABS
100.0000 mg | ORAL_TABLET | Freq: Every day | ORAL | Status: DC
  Administered 2023-01-11 – 2023-01-18 (×8): 100 mg via ORAL
  Filled 2023-01-11 (×8): qty 1

## 2023-01-11 MED ORDER — MAGNESIUM SULFATE IN D5W 1-5 GM/100ML-% IV SOLN
1.0000 g | INTRAVENOUS | Status: AC
  Administered 2023-01-11: 1 g via INTRAVENOUS
  Filled 2023-01-11: qty 100

## 2023-01-11 MED ORDER — ONDANSETRON HCL 4 MG/2ML IJ SOLN
4.0000 mg | Freq: Once | INTRAMUSCULAR | Status: AC
  Administered 2023-01-11: 4 mg via INTRAVENOUS
  Filled 2023-01-11: qty 2

## 2023-01-11 MED ORDER — THIAMINE HCL 100 MG/ML IJ SOLN
100.0000 mg | Freq: Every day | INTRAMUSCULAR | Status: DC
  Filled 2023-01-11 (×2): qty 2

## 2023-01-11 MED ORDER — PROMETHAZINE HCL 12.5 MG PO TABS
12.5000 mg | ORAL_TABLET | Freq: Four times a day (QID) | ORAL | Status: DC | PRN
  Administered 2023-01-11 – 2023-01-16 (×4): 12.5 mg via ORAL
  Filled 2023-01-11 (×4): qty 1

## 2023-01-11 MED ORDER — ACETAMINOPHEN 325 MG PO TABS
650.0000 mg | ORAL_TABLET | Freq: Four times a day (QID) | ORAL | Status: DC | PRN
  Administered 2023-01-12 – 2023-01-13 (×2): 650 mg via ORAL
  Filled 2023-01-11 (×3): qty 2

## 2023-01-11 MED ORDER — LORAZEPAM 2 MG/ML IJ SOLN
1.0000 mg | INTRAMUSCULAR | Status: AC | PRN
  Administered 2023-01-12 (×2): 4 mg via INTRAVENOUS
  Administered 2023-01-14: 2 mg via INTRAVENOUS
  Filled 2023-01-11: qty 1
  Filled 2023-01-11 (×2): qty 2
  Filled 2023-01-11: qty 1

## 2023-01-11 MED ORDER — MAGNESIUM SULFATE 2 GM/50ML IV SOLN
2.0000 g | INTRAVENOUS | Status: AC
  Administered 2023-01-11: 2 g via INTRAVENOUS
  Filled 2023-01-11: qty 50

## 2023-01-11 MED ORDER — FOLIC ACID 1 MG PO TABS
1.0000 mg | ORAL_TABLET | Freq: Every day | ORAL | Status: DC
  Administered 2023-01-11 – 2023-01-18 (×8): 1 mg via ORAL
  Filled 2023-01-11 (×8): qty 1

## 2023-01-11 MED ORDER — IOHEXOL 300 MG/ML  SOLN
100.0000 mL | Freq: Once | INTRAMUSCULAR | Status: AC | PRN
  Administered 2023-01-11: 100 mL via INTRAVENOUS

## 2023-01-11 MED ORDER — LORAZEPAM 1 MG PO TABS
1.0000 mg | ORAL_TABLET | ORAL | Status: AC | PRN
  Administered 2023-01-12 (×3): 1 mg via ORAL
  Administered 2023-01-12 (×3): 4 mg via ORAL
  Administered 2023-01-12: 1 mg via ORAL
  Administered 2023-01-13: 2 mg via ORAL
  Administered 2023-01-13: 1 mg via ORAL
  Administered 2023-01-13 (×2): 2 mg via ORAL
  Administered 2023-01-14 (×3): 1 mg via ORAL
  Filled 2023-01-11: qty 1
  Filled 2023-01-11: qty 4
  Filled 2023-01-11: qty 1
  Filled 2023-01-11: qty 4
  Filled 2023-01-11 (×2): qty 1
  Filled 2023-01-11 (×3): qty 2
  Filled 2023-01-11: qty 1
  Filled 2023-01-11: qty 2
  Filled 2023-01-11: qty 1

## 2023-01-11 MED ORDER — SODIUM CHLORIDE 0.9 % IV SOLN
3.0000 g | Freq: Four times a day (QID) | INTRAVENOUS | Status: DC
  Administered 2023-01-12 – 2023-01-14 (×10): 3 g via INTRAVENOUS
  Filled 2023-01-11 (×15): qty 8
  Filled 2023-01-11: qty 3
  Filled 2023-01-11 (×2): qty 8

## 2023-01-11 MED ORDER — ADULT MULTIVITAMIN W/MINERALS CH
1.0000 | ORAL_TABLET | Freq: Every day | ORAL | Status: DC
  Administered 2023-01-11 – 2023-01-18 (×8): 1 via ORAL
  Filled 2023-01-11 (×8): qty 1

## 2023-01-11 MED ORDER — ENOXAPARIN SODIUM 40 MG/0.4ML IJ SOSY
40.0000 mg | PREFILLED_SYRINGE | INTRAMUSCULAR | Status: DC
  Administered 2023-01-11 – 2023-01-17 (×7): 40 mg via SUBCUTANEOUS
  Filled 2023-01-11 (×7): qty 0.4

## 2023-01-11 MED ORDER — ACETAMINOPHEN 650 MG RE SUPP: 650.0000 mg | Freq: Four times a day (QID) | RECTAL | Status: DC | PRN

## 2023-01-11 MED ORDER — BOOST / RESOURCE BREEZE PO LIQD CUSTOM
1.0000 | Freq: Three times a day (TID) | ORAL | Status: DC
  Administered 2023-01-12 – 2023-01-18 (×13): 1 via ORAL

## 2023-01-11 MED ORDER — SODIUM CHLORIDE 0.9 % IV SOLN: INTRAVENOUS | Status: DC

## 2023-01-11 MED ORDER — MORPHINE SULFATE (PF) 2 MG/ML IV SOLN
2.0000 mg | INTRAVENOUS | Status: DC | PRN
  Administered 2023-01-11 – 2023-01-18 (×8): 2 mg via INTRAVENOUS
  Filled 2023-01-11 (×8): qty 1

## 2023-01-11 MED ORDER — POTASSIUM CHLORIDE 10 MEQ/100ML IV SOLN
10.0000 meq | Freq: Once | INTRAVENOUS | Status: AC
  Administered 2023-01-11: 10 meq via INTRAVENOUS
  Filled 2023-01-11: qty 100

## 2023-01-11 MED ORDER — SODIUM CHLORIDE 0.9 % IV SOLN
3.0000 g | Freq: Once | INTRAVENOUS | Status: AC
  Administered 2023-01-11: 3 g via INTRAVENOUS
  Filled 2023-01-11: qty 8

## 2023-01-11 NOTE — ED Notes (Signed)
Patient transported to CT 

## 2023-01-11 NOTE — ED Notes (Signed)
Pt removed all monitoring equipment. 

## 2023-01-11 NOTE — Assessment & Plan Note (Addendum)
Presenting with vomiting, diarrhea, abdominal pain, multiple electrolyte abnormalities- hyponatremia hypokalemia and hypomagnesemia.  Rules out for sepsis.  CT abdomen and pelvis with contrast-findings concerning for colitis. -Hydrate -Clear liquid diet -IV morphine 2 mg every 4 hours as needed -Stool C. difficile, GI pathogen panel -Continue Unasyn started in ED

## 2023-01-11 NOTE — Telephone Encounter (Signed)
Spoke with pt. He stated he was on his way to the ER. He wanted [place] to call and give heads up on him regarding his symptoms. Advised pt spoke with provider and she states that couldn't do that, that he would just have to go through the ER and be triaged like everyone else. Pt verbalized understanding.

## 2023-01-11 NOTE — Assessment & Plan Note (Addendum)
-  In the setting of GI loss -Magnesium within normal limits; continue to replete potassium and follow trend.   -Continue telemetry monitoring for another 24 hours.

## 2023-01-11 NOTE — Telephone Encounter (Signed)
Patient left a message that he has been speaking to someone this morning and he asked to "speak to that girl again".  I don't know who he was speaking to, I see no notes other than we he spoke to [place] on 6/24.

## 2023-01-11 NOTE — ED Triage Notes (Signed)
Pt via POV sent by PCP due to abnormal sodium levels on recent lab work. Pt states he also has a large hernia and has had yellow/green diarrhea. Abdominal pain rated 9/10 in LLQ and LUQ as well as epigastric area. Decreased appetite, n/v/d.

## 2023-01-11 NOTE — Assessment & Plan Note (Addendum)
Stable. -Awaiting med reconciliation

## 2023-01-11 NOTE — H&P (Addendum)
History and Physical    Gabriel Koch ZOX:[id number] DOB: 11/07/1965 DOA: 01/11/2023  PCP: Elfredia Nevins, MD   Patient coming from: Home  I have personally briefly reviewed patient's old medical records in [facility]  Chief Complaint: Vomiting, diarrhea  HPI: Gabriel Koch is a 57 y.o. male with medical history significant for alcohol abuse, hypertension, CVA, COPD, PTSD. Patient presented to the ED with complaints of vomiting and diarrhea with abdominal pain.  He reports symptoms started about 2 to 3 weeks ago.  Abdominal pain is diffuse.  Reports diarrhea up to 8-9 times a day, and vomiting about 2-3 times daily.  Reports generalized weakness, lightheadedness.  Saw outpatient provider, about 10 days ago, and was referred to the ED because his sodium was low- 122 but patient did not go.  ED Course: Temperature 98.2.  Heart rate 89-104.  Respirate rate 16-22.  Blood pressure systolic 1 23-1 50.  O2 sats greater than 95% on room air.   Sodium 113.  Potassium 3.  Magnesium 1.4.  Mild AST elevation at 58.  CT abdomen and pelvis suggest mild diffuse colitis. IV Unasyn started. N/s at 250cc/hr started.  Review of Systems: As per HPI all other systems reviewed and negative.  Past Medical History:  Diagnosis Date   Alcohol abuse    Anxiety and depression    Blood transfusion    Complication of anesthesia    patient hemorrrhaged about 2 weeks after tonsillecotmy   COPD (chronic obstructive pulmonary disease) (HCC)    GERD (gastroesophageal reflux disease)    Hypertension    PTSD (post-traumatic stress disorder)    Stroke (HCC) 2021    Past Surgical History:  Procedure Laterality Date   APPENDECTOMY     BIOPSY  04/02/2020   Procedure: BIOPSY;  Surgeon: Lanelle Bal, DO;  [facility];  Service: Endoscopy;;   BIOPSY  03/18/2022   Procedure: BIOPSY;  Surgeon: Lanelle Bal, DO;  [facility];  Service: Endoscopy;;   COLONOSCOPY WITH  PROPOFOL N/A 04/02/2020   Procedure: COLONOSCOPY WITH PROPOFOL;  Surgeon: Lanelle Bal, DO;  [facility];  Service: Endoscopy;  Laterality: N/A;  8:30am   COLONOSCOPY WITH PROPOFOL N/A 03/18/2022   Procedure: COLONOSCOPY WITH PROPOFOL;  Surgeon: Lanelle Bal, DO;  [facility]: [facility];  Service: Endoscopy;  Laterality: N/A;  1:15 pm, pt knows to arrive at 10:45 can't come earlier due to transportation   ESOPHAGOGASTRODUODENOSCOPY (EGD) WITH PROPOFOL N/A 04/02/2020   Procedure: ESOPHAGOGASTRODUODENOSCOPY (EGD) WITH PROPOFOL;  Surgeon: Lanelle Bal, DO;  [facility];  Service: Endoscopy;  Laterality: N/A;   ESOPHAGOGASTRODUODENOSCOPY (EGD) WITH PROPOFOL N/A 03/21/2021   Procedure: ESOPHAGOGASTRODUODENOSCOPY (EGD) WITH PROPOFOL;  Surgeon: Lanelle Bal, DO;  [facility];  Service: Endoscopy;  Laterality: N/A;  11:00am   ESOPHAGOGASTRODUODENOSCOPY (EGD) WITH PROPOFOL N/A 03/18/2022   Procedure: ESOPHAGOGASTRODUODENOSCOPY (EGD) WITH PROPOFOL;  Surgeon: Lanelle Bal, DO;  [facility];  Service: Endoscopy;  Laterality: N/A;   POLYPECTOMY  04/02/2020   Procedure: POLYPECTOMY;  Surgeon: Lanelle Bal, DO;  [facility];  Service: Endoscopy;;   POLYPECTOMY  03/21/2021   Procedure: POLYPECTOMY;  Surgeon: Lanelle Bal, DO;  [facility];  Service: Endoscopy;;  duodenal   POLYPECTOMY  03/18/2022   Procedure: POLYPECTOMY;  Surgeon: Lanelle Bal, DO;  [facility];  Service: Endoscopy;;   REPAIR EXTENSOR TENDON Left 08/06/2022  Procedure: LEFT LONG FINGER EXTENSOR TENDON CENTRALIZATION;  Surgeon: Betha Loa, MD;  Location: [facility];  Service: Orthopedics;  Laterality: Left;  60 MIN   STERIOD INJECTION Left 08/06/2022   Procedure: LEFT CARPAL TUNNEL INJECTION;  Surgeon: Betha Loa, MD;  Location: [facility];  Service: Orthopedics;  Laterality: Left;   TONSILLECTOMY        reports that he has been smoking cigars and cigarettes. He has a 30.00 pack-year smoking history. He has never used smokeless tobacco. He reports current alcohol use of about 2.0 standard drinks of alcohol per week. He reports that he does not use drugs.  Allergies  Allergen Reactions   Bee Venom Anaphylaxis    Family History  Problem Relation Age of Onset   Colon cancer Neg Hx        Pt knows little about family history    Prior to Admission medications   Medication Sig Start Date End Date Taking? Authorizing Provider  albuterol (VENTOLIN HFA) 108 (90 Base) MCG/ACT inhaler Inhale 1 puff into the lungs every 4 (four) hours as needed for wheezing or shortness of breath.    [provider]  buPROPion (WELLBUTRIN) 75 MG tablet Take 75 mg by mouth 2 (two) times daily.    [provider]  busPIRone (BUSPAR) 10 MG tablet Take 1 tablet (10 mg total) by mouth 2 (two) times daily. 12/17/21   Shon Hale, MD  celecoxib (CELEBREX) 200 MG capsule Take 200 mg by mouth daily.    [provider]  esomeprazole (NEXIUM) 40 MG capsule TAKE ONE CAPSULE BY MOUTH TWICE DAILY before meals 01/01/23   Rourk, Gerrit Friends, MD  famotidine (PEPCID) 20 MG tablet Take 1 tablet (20 mg total) by mouth at bedtime. 12/24/22   Letta Median, PA-C  folic acid (FOLVITE) 1 MG tablet 1 tablet Orally Once a day for 90 days 08/13/22   [provider]  hydrocortisone (ANUSOL-HC) 2.5 % rectal cream Place 1 Application rectally 2 (two) times daily. 12/24/22   Letta Median, PA-C  losartan (COZAAR) 50 MG tablet Take 1 tablet (50 mg total) by mouth daily. 12/17/21   Shon Hale, MD  tiotropium (SPIRIVA) 18 MCG inhalation capsule Place 18 mcg into inhaler and inhale daily. Patient not taking: Reported on 12/24/2022    [provider]  traZODone (DESYREL) 100 MG tablet Take 1 tablet (100 mg total) by mouth at bedtime. 12/17/21   Shon Hale, MD  zolpidem (AMBIEN) 5 MG tablet  Take 5 mg by mouth at bedtime as needed for sleep.    [provider]    Physical Exam: Vitals:   01/11/23 1346 01/11/23 1347 01/11/23 1645  BP: (!) 150/80  (!) 140/78  Pulse: 95  89  Resp: 16  17  Temp: 98 F (36.7 C)    SpO2: 98%  95%  Weight:  69.4 kg   Height:  5\' 9"  (1.753 m)     Constitutional: NAD, calm, comfortable Vitals:   01/11/23 1346 01/11/23 1347 01/11/23 1645  BP: (!) 150/80  (!) 140/78  Pulse: 95  89  Resp: 16  17  Temp: 98 F (36.7 C)    SpO2: 98%  95%  Weight:  69.4 kg   Height:  5\' 9"  (1.753 m)    Eyes: PERRL, lids and conjunctivae normal ENMT: Mucous membranes are dry.   Neck: normal, supple, no masses, no thyromegaly Respiratory: clear to auscultation bilaterally, no wheezing, no crackles. Normal respiratory effort.  No accessory muscle use.  Cardiovascular: Regular rate and rhythm, no murmurs / rubs / gallops. No extremity edema. 2+ pedal pulses.  Abdomen: Diffuse abdominal tenderness, no masses palpated. No hepatosplenomegaly.  Musculoskeletal: no clubbing / cyanosis. No joint deformity upper and lower extremities.  Skin: no rashes, lesions, ulcers. No induration Neurologic: No apparent cranial nerve abnormality, moving extremities spontaneously.  Psychiatric: Normal judgment and insight. Alert and oriented x 3. Normal mood.   Labs on Admission: I have personally reviewed following labs and imaging studies  CBC: Recent Labs  Lab 01/11/23 1359  WBC 5.1  HGB 11.6*  HCT 32.3*  MCV 98.8  PLT 158   Basic Metabolic Panel: Recent Labs  Lab 01/11/23 1359  NA 113*  K 3.0*  CL 81*  CO2 19*  GLUCOSE 101*  BUN 9  CREATININE 1.17  CALCIUM 8.5*  MG 1.4*   GFR: Estimated Creatinine Clearance: 69.2 mL/min (by C-G formula based on SCr of 1.17 mg/dL). Liver Function Tests: Recent Labs  Lab 01/11/23 1359  AST 58*  ALT 37  ALKPHOS 77  BILITOT 0.6  PROT 7.3  ALBUMIN 3.6   Recent Labs  Lab 01/11/23 1359  LIPASE 89*    Radiological Exams on Admission: CT ABDOMEN PELVIS W CONTRAST  Result Date: 01/11/2023 CLINICAL DATA:  Left upper quadrant abdominal pain. EXAM: CT ABDOMEN AND PELVIS WITH CONTRAST TECHNIQUE: Multidetector CT imaging of the abdomen and pelvis was performed using the standard protocol following bolus administration of intravenous contrast. RADIATION DOSE REDUCTION: This exam was performed according to the departmental dose-optimization program which includes automated exposure control, adjustment of the mA and/or kV according to patient size and/or use of iterative reconstruction technique. CONTRAST:  OMNIPAQUE IOHEXOL 300 MG/ML  SOLN COMPARISON:  CT abdomen pelvis dated 02/09/2022. FINDINGS: Lower chest: The visualized lung bases are clear. There is coronary vascular calcification. No intra-abdominal free air.  Trace free fluid within the pelvis. Hepatobiliary: Faint subcentimeter hypodense lesion in the left lobe of the liver stool small to characterize. The liver is otherwise unremarkable. No biliary dilatation. The gallbladder is unremarkable. Pancreas: Unremarkable. No pancreatic ductal dilatation or surrounding inflammatory changes. Spleen: Normal in size without focal abnormality. Adrenals/Urinary Tract: The adrenal glands unremarkable. There is no hydronephrosis on either side. There is symmetric enhancement and excretion of contrast by both kidneys. The visualized ureters and urinary bladder appear unremarkable. Stomach/Bowel: There is mild diffuse thickened appearance of the colon concerning for colitis. Clinical correlation is recommended. Loose stool within the colon consistent with diarrheal state. There is no bowel obstruction. The appendix is not visualized with certainty. No inflammatory changes identified in the right lower quadrant. Vascular/Lymphatic: Moderate aortoiliac atherosclerotic disease. The IVC is unremarkable. No portal venous gas. There is no adenopathy. Reproductive: The  prostate and seminal vesicles are grossly unremarkable. No pelvic mass. Other: None Musculoskeletal: Osteopenia.  No acute osseous pathology. IMPRESSION: 1. Findings concerning for colitis. No bowel obstruction. 2.  Aortic Atherosclerosis (ICD10-I70.0). Electronically Signed   By: Elgie Collard M.D.   On: 01/11/2023 16:31    EKG: Independently reviewed.  Sinus rhythm, rate 90, old RBBB.  First-degree AV block with PR interval of 242.  Assessment/Plan Principal Problem:   Hyponatremia Active Problems:   Colitis   Alcohol abuse   HTN (hypertension)   Hypokalemia   Hypomagnesemia   Assessment and Plan: * Hyponatremia Sodium 113, baseline 129-133 over the past year.  Likely due to GI loss. -Hydrate with normal saline started at 250cc/hr in  ED, reduce rate to 100cc/hr -Monitor sodium every 4 hourly x 4 -Goal rate of correction ~ 10 in 24hrs  Colitis Presenting with vomiting, diarrhea, abdominal pain, multiple electrolyte abnormalities- hyponatremia hypokalemia and hypomagnesemia.  Rules out for sepsis.  CT abdomen and pelvis with contrast-findings concerning for colitis. -Hydrate -Clear liquid diet -IV morphine 2 mg every 4 hours as needed -Stool C. difficile, GI pathogen panel -Continue Unasyn started in ED -Addendum, stool C. difficile negative.  As needed Imodium ordered   Alcohol abuse Drinks 2-4   ounce beers daily.  Last drink was this morning at about 8 AM.  At this time no sign of withdrawal.  Multiple electrolyte abnormalities. -CIWA as needed -Thiamine folate multivitamins  Hypomagnesemia Magnesium 1.4.  In the setting of Vomiting diarrhea and alcohol abuse.  Hypokalemia Potassium 3.  Secondary to GI losses, and alcohol abuse.  HTN (hypertension) Stable. -Awaiting med reconciliation   DVT prophylaxis: Lovenox Code Status: FULL  code, confirmed with patient at bedside Family Communication: None at bedside Disposition Plan: ~ 2 days Consults called:  None Admission status: Inpt Stepdown I certify that at the point of admission it is my clinical judgment that the patient will require inpatient hospital care spanning beyond 2 midnights from the point of admission due to high intensity of service, high risk for further deterioration and high frequency of surveillance required.    Author: Onnie Boer, MD 01/11/2023 2:02 AM  For on call review [email or web address].

## 2023-01-11 NOTE — Assessment & Plan Note (Addendum)
Sodium 113, baseline 129-133 over the past year.  Likely due to GI loss. -Hydrate with normal saline started at 250cc/hr in ED, reduce rate to 100cc/hr -Monitor sodium every 4 hourly x 3 -Goal rate of correction ~ 10 in 24hrs

## 2023-01-11 NOTE — Assessment & Plan Note (Signed)
Drinks 2-4   ounce beers daily.  Last drink was this morning at about 8 AM.  At this time no sign of withdrawal.  Multiple electrolyte abnormalities. -CIWA as needed -Thiamine folate multivitamins

## 2023-01-11 NOTE — Assessment & Plan Note (Signed)
Magnesium 1.4.  In the setting of Vomiting diarrhea and alcohol abuse.

## 2023-01-11 NOTE — Telephone Encounter (Signed)
Noted.  Patient is currently in the ER.

## 2023-01-11 NOTE — ED Notes (Signed)
ED TO INPATIENT HANDOFF REPORT  ED Nurse Name and Phone #:  Johnney Killian Name/Age/Gender Gabriel Koch 57 y.o. male Room/Bed: APA09/APA09  Code Status   Code Status: Full Code  Home/SNF/Other Home Patient oriented to: self, place, time, and situation Is this baseline? Yes   Triage Complete: Triage complete  Chief Complaint Hyponatremia [E87.1]  Triage Note Pt via POV sent by PCP due to abnormal sodium levels on recent lab work. Pt states he also has a large hernia and has had yellow/green diarrhea. Abdominal pain rated 9/10 in LLQ and LUQ as well as epigastric area. Decreased appetite, n/v/d.    Allergies Allergies  Allergen Reactions   Bee Venom Anaphylaxis    Level of Care/Admitting Diagnosis ED Disposition     ED Disposition  Admit   Condition  --   Comment  Hospital Area: [facility] [[id number]]  Level of Care: Stepdown [14]  Covid Evaluation: Asymptomatic - no recent exposure (last 10 days) testing not required  Diagnosis: Hyponatremia [161096]  Admitting Physician: Onnie Boer [[id number]]  Attending Physician: Onnie Boer [phone]  Certification:: I certify this patient will need inpatient services for at least 2 midnights  Estimated Length of Stay: 2          B Medical/Surgery History Past Medical History:  Diagnosis Date   Alcohol abuse    Anxiety and depression    Blood transfusion    Complication of anesthesia    patient hemorrrhaged about 2 weeks after tonsillecotmy   COPD (chronic obstructive pulmonary disease) (HCC)    GERD (gastroesophageal reflux disease)    Hypertension    PTSD (post-traumatic stress disorder)    Stroke (HCC) 2021   Past Surgical History:  Procedure Laterality Date   APPENDECTOMY     BIOPSY  04/02/2020   Procedure: BIOPSY;  Surgeon: Lanelle Bal, DO;  [facility];  Service: Endoscopy;;   BIOPSY  03/18/2022   Procedure: BIOPSY;  Surgeon: Lanelle Bal, DO;  [facility];  Service: Endoscopy;;   COLONOSCOPY WITH PROPOFOL N/A 04/02/2020   Procedure: COLONOSCOPY WITH PROPOFOL;  Surgeon: Lanelle Bal, DO;  [facility];  Service: Endoscopy;  Laterality: N/A;  8:30am   COLONOSCOPY WITH PROPOFOL N/A 03/18/2022   Procedure: COLONOSCOPY WITH PROPOFOL;  Surgeon: Lanelle Bal, DO;  [facility];  Service: Endoscopy;  Laterality: N/A;  1:15 pm, pt knows to arrive at 10:45 can't come earlier due to transportation   ESOPHAGOGASTRODUODENOSCOPY (EGD) WITH PROPOFOL N/A 04/02/2020   Procedure: ESOPHAGOGASTRODUODENOSCOPY (EGD) WITH PROPOFOL;  Surgeon: Lanelle Bal, DO;  [facility];  Service: Endoscopy;  Laterality: N/A;   ESOPHAGOGASTRODUODENOSCOPY (EGD) WITH PROPOFOL N/A 03/21/2021   Procedure: ESOPHAGOGASTRODUODENOSCOPY (EGD) WITH PROPOFOL;  Surgeon: Lanelle Bal, DO;  [facility];  Service: Endoscopy;  Laterality: N/A;  11:00am   ESOPHAGOGASTRODUODENOSCOPY (EGD) WITH PROPOFOL N/A 03/18/2022   Procedure: ESOPHAGOGASTRODUODENOSCOPY (EGD) WITH PROPOFOL;  Surgeon: Lanelle Bal, DO;  [facility];  Service: Endoscopy;  Laterality: N/A;   POLYPECTOMY  04/02/2020   Procedure: POLYPECTOMY;  Surgeon: Lanelle Bal, DO;  [facility];  Service: Endoscopy;;   POLYPECTOMY  03/21/2021   Procedure: POLYPECTOMY;  Surgeon: Lanelle Bal, DO;  [facility];  Service: Endoscopy;;  duodenal   POLYPECTOMY  03/18/2022   Procedure: POLYPECTOMY;  Surgeon: Lanelle Bal, DO;  [facility];  Service: Endoscopy;;  REPAIR EXTENSOR TENDON Left 08/06/2022   Procedure: LEFT LONG FINGER EXTENSOR TENDON CENTRALIZATION;  Surgeon: Betha Loa, MD;  Location: [facility];  Service: Orthopedics;  Laterality: Left;  60 MIN   STERIOD INJECTION Left 08/06/2022   Procedure: LEFT CARPAL TUNNEL INJECTION;  Surgeon: Betha Loa, MD;  Location: [facility];  Service:  Orthopedics;  Laterality: Left;   TONSILLECTOMY       A IV Location/Drains/Wounds Patient Lines/Drains/Airways Status     Active Line/Drains/Airways     Name Placement date Placement time Site Days   Peripheral IV 01/11/23 20 G 1" Anterior;Left;Proximal Forearm 01/11/23  1454  Forearm  less than 1            Intake/Output Last 24 hours  Intake/Output Summary (Last 24 hours) at 01/11/2023 2108 Last data filed at 01/11/2023 1952 Gross per 24 hour  Intake 242.63 ml  Output --  Net 242.63 ml    Labs/Imaging Results for orders placed or performed during the hospital encounter of 01/11/23 (from the past 48 hour(s))  Lipase, blood     Status: Abnormal   Collection Time: 01/11/23  1:59 PM  Result Value Ref Range   Lipase 89 (H) 11 - 51 U/L    Comment: Performed at [facility], [address] [place], [place] [address]  Comprehensive metabolic panel     Status: Abnormal   Collection Time: 01/11/23  1:59 PM  Result Value Ref Range   Sodium 113 (LL) 135 - 145 mmol/L    Comment: CRITICAL RESULT CALLED TO, READ BACK BY AND VERIFIED WITH GREENWOOD,B ON 01/11/23 AT 1445 BY LOY,C   Potassium 3.0 (L) 3.5 - 5.1 mmol/L   Chloride 81 (L) 98 - 111 mmol/L   CO2 19 (L) 22 - 32 mmol/L   Glucose, Bld 101 (H) 70 - 99 mg/dL    Comment: Glucose reference range applies only to samples taken after fasting for at least 8 hours.   BUN 9 6 - 20 mg/dL   Creatinine, Ser 5.62 0.61 - 1.24 mg/dL   Calcium 8.5 (L) 8.9 - 10.3 mg/dL   Total Protein 7.3 6.5 - 8.1 g/dL   Albumin 3.6 3.5 - 5.0 g/dL   AST 58 (H) 15 - 41 U/L   ALT 37 0 - 44 U/L   Alkaline Phosphatase 77 38 - 126 U/L   Total Bilirubin 0.6 0.3 - 1.2 mg/dL   GFR, Estimated >13 >08 mL/min    Comment: (NOTE) Calculated using the CKD-EPI Creatinine Equation (2021)    Anion gap 13 5 - 15    Comment: Performed at [facility], [address], [place], [place] [address]  CBC     Status: Abnormal   Collection Time: 01/11/23  1:59 PM  Result Value  Ref Range   WBC 5.1 4.0 - 10.5 K/uL   RBC 3.27 (L) 4.22 - 5.81 MIL/uL   Hemoglobin 11.6 (L) 13.0 - 17.0 g/dL   HCT 69.6 (L) 29.5 - 28.4 %   MCV 98.8 80.0 - 100.0 fL   MCH 35.5 (H) 26.0 - 34.0 pg   MCHC 35.9 30.0 - 36.0 g/dL   RDW 13.2 44.0 - 10.2 %   Platelets 158 150 - 400 K/uL   nRBC 0.0 0.0 - 0.2 %    Comment: Performed at [facility], [address], [place], [place] [address]  Magnesium     Status: Abnormal   Collection Time: 01/11/23  1:59 PM  Result Value Ref Range  Magnesium 1.4 (L) 1.7 - 2.4 mg/dL    Comment: Performed at [facility], [address], [place], [place] [address]  Urinalysis, Routine w reflex microscopic -Urine, Clean Catch     Status: Abnormal   Collection Time: 01/11/23  4:49 PM  Result Value Ref Range   Color, Urine COLORLESS (A) YELLOW   APPearance CLEAR CLEAR   Specific Gravity, Urine 1.011 1.005 - 1.030   pH 6.0 5.0 - 8.0   Glucose, UA NEGATIVE NEGATIVE mg/dL   Hgb urine dipstick NEGATIVE NEGATIVE   Bilirubin Urine NEGATIVE NEGATIVE   Ketones, ur NEGATIVE NEGATIVE mg/dL   Protein, ur NEGATIVE NEGATIVE mg/dL   Nitrite NEGATIVE NEGATIVE   Leukocytes,Ua NEGATIVE NEGATIVE    Comment: Performed at [facility], [address], [place], [place] [address]  C Difficile Quick Screen w PCR reflex     Status: None   Collection Time: 01/11/23  6:30 PM   Specimen: STOOL  Result Value Ref Range   C Diff antigen NEGATIVE NEGATIVE   C Diff toxin NEGATIVE NEGATIVE   C Diff interpretation No C. difficile detected.     Comment: Performed at [facility], [address], [place], [place] [address]  Basic metabolic panel     Status: Abnormal   Collection Time: 01/11/23  7:47 PM  Result Value Ref Range   Sodium 118 (LL) 135 - 145 mmol/L    Comment: CRITICAL RESULT CALLED TO, READ BACK BY AND VERIFIED WITH Pressley Barsky,A ON 01/11/23 AT 2040 BY LOY,C   Potassium 3.2 (L) 3.5 - 5.1 mmol/L   Chloride 87 (L) 98 - 111 mmol/L   CO2 21 (L) 22 - 32 mmol/L   Glucose, Bld 90 70 - 99  mg/dL    Comment: Glucose reference range applies only to samples taken after fasting for at least 8 hours.   BUN 8 6 - 20 mg/dL   Creatinine, Ser 5.62 0.61 - 1.24 mg/dL   Calcium 8.1 (L) 8.9 - 10.3 mg/dL   GFR, Estimated >13 >08 mL/min    Comment: (NOTE) Calculated using the CKD-EPI Creatinine Equation (2021)    Anion gap 10 5 - 15    Comment: Performed at [facility], [address], [place], [place] [address]  Ethanol     Status: Abnormal   Collection Time: 01/11/23  8:28 PM  Result Value Ref Range   Alcohol, Ethyl (B) 19 (H) <10 mg/dL    Comment: (NOTE) Lowest detectable limit for serum alcohol is 10 mg/dL.  For medical purposes only. Performed at [facility], [address] [place], [place] [address]    CT ABDOMEN PELVIS W CONTRAST  Result Date: 01/11/2023 CLINICAL DATA:  Left upper quadrant abdominal pain. EXAM: CT ABDOMEN AND PELVIS WITH CONTRAST TECHNIQUE: Multidetector CT imaging of the abdomen and pelvis was performed using the standard protocol following bolus administration of intravenous contrast. RADIATION DOSE REDUCTION: This exam was performed according to the departmental dose-optimization program which includes automated exposure control, adjustment of the mA and/or kV according to patient size and/or use of iterative reconstruction technique. CONTRAST:  OMNIPAQUE IOHEXOL 300 MG/ML  SOLN COMPARISON:  CT abdomen pelvis dated 02/09/2022. FINDINGS: Lower chest: The visualized lung bases are clear. There is coronary vascular calcification. No intra-abdominal free air.  Trace free fluid within the pelvis. Hepatobiliary: Faint subcentimeter hypodense lesion in the left lobe of the liver stool small to characterize. The liver is otherwise unremarkable. No biliary dilatation. The gallbladder is unremarkable. Pancreas: Unremarkable. No pancreatic  ductal dilatation or surrounding inflammatory changes. Spleen: Normal in size without focal abnormality. Adrenals/Urinary Tract: The  adrenal glands unremarkable. There is no hydronephrosis on either side. There is symmetric enhancement and excretion of contrast by both kidneys. The visualized ureters and urinary bladder appear unremarkable. Stomach/Bowel: There is mild diffuse thickened appearance of the colon concerning for colitis. Clinical correlation is recommended. Loose stool within the colon consistent with diarrheal state. There is no bowel obstruction. The appendix is not visualized with certainty. No inflammatory changes identified in the right lower quadrant. Vascular/Lymphatic: Moderate aortoiliac atherosclerotic disease. The IVC is unremarkable. No portal venous gas. There is no adenopathy. Reproductive: The prostate and seminal vesicles are grossly unremarkable. No pelvic mass. Other: None Musculoskeletal: Osteopenia.  No acute osseous pathology. IMPRESSION: 1. Findings concerning for colitis. No bowel obstruction. 2.  Aortic Atherosclerosis (ICD10-I70.0). Electronically Signed   By: Elgie Collard M.D.   On: 01/11/2023 16:31    Pending Labs Unresulted Labs (From admission, onward)     Start     Ordered   01/12/23 0500  HIV Antibody (routine testing w rflx)  (HIV Antibody (Routine testing w reflex) panel)  Tomorrow morning,   R        01/11/23 2030   01/12/23 0500  CBC  Tomorrow morning,   R        01/11/23 2030   01/12/23 0500  Magnesium  Tomorrow morning,   R        01/11/23 2019   01/11/23 2042  MRSA Next Gen by PCR, Nasal  Once,   R        01/11/23 2041   01/11/23 2008  Gastrointestinal Panel by PCR , Stool  (Gastrointestinal Panel by PCR, Stool                                                                                                                                                     **Does Not include CLOSTRIDIUM DIFFICILE testing. **If CDIFF testing is needed, place order from the "C Difficile Testing" order set.**)  Add-on,   AD        01/11/23 2007   01/11/23 1940  Basic metabolic panel  Now then  every 4 hours,   R (with TIMED occurrences)      01/11/23 1940            Vitals/Pain Today's Vitals   01/11/23 1645 01/11/23 1700 01/11/23 1918 01/11/23 2018  BP: (!) 140/78 (!) 123/108  (!) 129/105  Pulse: 89 (!) 104  (!) 102  Resp: 17 (!) 22  20  Temp:   98.2 F (36.8 C)   TempSrc:   Oral   SpO2: 95% 99%  99%  Weight:      Height:      PainSc:        Isolation Precautions No  active isolations  Medications Medications  0.9 %  sodium chloride infusion ( Intravenous Rate/Dose Change 01/11/23 2015)  potassium chloride SA (KLOR-CON M) CR tablet 40 mEq (40 mEq Oral Given 01/11/23 2012)  LORazepam (ATIVAN) tablet 1-4 mg (has no administration in time range)    Or  LORazepam (ATIVAN) injection 1-4 mg (has no administration in time range)  thiamine (VITAMIN B1) tablet 100 mg (has no administration in time range)    Or  thiamine (VITAMIN B1) injection 100 mg (has no administration in time range)  folic acid (FOLVITE) tablet 1 mg (has no administration in time range)  multivitamin with minerals tablet 1 tablet (has no administration in time range)  enoxaparin (LOVENOX) injection 40 mg (has no administration in time range)  acetaminophen (TYLENOL) tablet 650 mg (has no administration in time range)    Or  acetaminophen (TYLENOL) suppository 650 mg (has no administration in time range)  promethazine (PHENERGAN) tablet 12.5 mg (has no administration in time range)  Ampicillin-Sulbactam (UNASYN) 3 g in sodium chloride 0.9 % 100 mL IVPB (3 g Intravenous Not Given 01/11/23 2016)  magnesium sulfate IVPB 1 g 100 mL (has no administration in time range)  potassium chloride 10 mEq in 100 mL IVPB (0 mEq Intravenous Stopped 01/11/23 1721)  magnesium sulfate IVPB 2 g 50 mL (0 g Intravenous Stopped 01/11/23 1722)  iohexol (OMNIPAQUE) 300 MG/ML solution 100 mL (100 mLs Intravenous Contrast Given 01/11/23 1617)  Ampicillin-Sulbactam (UNASYN) 3 g in sodium chloride 0.9 % 100 mL IVPB (0 g Intravenous  Stopped 01/11/23 1952)    Mobility walks     Focused Assessments Cardiac Assessment Handoff:    Lab Results  Component Value Date   CKTOTAL 132 09/09/2011   CKMB 2.5 09/09/2011   TROPONINI <0.03 12/26/2018   No results found for: "DDIMER" Does the Patient currently have chest pain? No    R Recommendations: See Admitting Provider Note  Report given to:   Additional Notes:

## 2023-01-11 NOTE — ED Notes (Signed)
Sodium 113 Informed MD

## 2023-01-11 NOTE — Telephone Encounter (Signed)
See result notes.  Pt has been vomiting and says when he wipes it's green/yellow or just yellow.  Pt says he has been drinking body armour and electrolyte drinks. He also says he is drinking other things but wants to know what is causing him to vomit and make his stools the color they are? Please advise. Thank you. Please advise.

## 2023-01-11 NOTE — Telephone Encounter (Signed)
I am not entirely sure what the cause of his symptoms are. This is why we are pursing a CT. Unfortunately he missed his appointment for this on 7/5, and as he previously stated, he doesn't have transportation to have CT completed this week, so he will have to keep his appointment scheduled for 7/19, or proceed to the emergency room for evaluation.   His low sodium may be contributing to his nausea/vomiting. I had advised he go to the ER last week, but he did not. He needs to repeat BMP ASAP to see if his sodium has declined further.   I will send a prescription of Zofran to his pharmacy. He can take this every 8 hours as needed for N/V.

## 2023-01-11 NOTE — Telephone Encounter (Signed)
Noted  

## 2023-01-11 NOTE — ED Notes (Signed)
Pt given urinal and informed of need for urine specimen  

## 2023-01-11 NOTE — ED Provider Notes (Signed)
[place] EMERGENCY DEPARTMENT AT [facility] Provider Note   CSN: [id number] Arrival date & time: 01/11/23  1335     History  Chief Complaint  Patient presents with   Abnormal Lab    Gabriel Koch is a 57 y.o. male.   Abnormal Lab  This patient is a 57 year old male, he has a history of hypertension on losartan, he takes BuSpar and Wellbutrin and albuterol, he continues to smoke cigarettes and drinks about 3-4 beers per day.  He has had some element of ongoing abdominal pain on the left side and has been told that he may have a hernia in that area, for at least a month and a half the patient has had rather frequent nausea vomiting and diarrhea which is not getting any better.  Lab work was performed this week by his primary care doctor and he was informed that he needed to come to the hospital for low sodium.  The patient is felt extremely weak and lightheaded    Home Medications Prior to Admission medications   Medication Sig Start Date End Date Taking? Authorizing Provider  albuterol (VENTOLIN HFA) 108 (90 Base) MCG/ACT inhaler Inhale 1 puff into the lungs every 4 (four) hours as needed for wheezing or shortness of breath.    [provider]  buPROPion (WELLBUTRIN) 75 MG tablet Take 75 mg by mouth 2 (two) times daily.    [provider]  busPIRone (BUSPAR) 10 MG tablet Take 1 tablet (10 mg total) by mouth 2 (two) times daily. 12/17/21   Shon Hale, MD  celecoxib (CELEBREX) 200 MG capsule Take 200 mg by mouth daily.    [provider]  esomeprazole (NEXIUM) 40 MG capsule TAKE ONE CAPSULE BY MOUTH TWICE DAILY before meals 01/01/23   Rourk, Gerrit Friends, MD  famotidine (PEPCID) 20 MG tablet Take 1 tablet (20 mg total) by mouth at bedtime. 12/24/22   Letta Median, PA-C  folic acid (FOLVITE) 1 MG tablet 1 tablet Orally Once a day for 90 days 08/13/22   [provider]  hydrocortisone (ANUSOL-HC) 2.5 % rectal cream Place 1 Application  rectally 2 (two) times daily. 12/24/22   Letta Median, PA-C  losartan (COZAAR) 50 MG tablet Take 1 tablet (50 mg total) by mouth daily. 12/17/21   Shon Hale, MD  tiotropium (SPIRIVA) 18 MCG inhalation capsule Place 18 mcg into inhaler and inhale daily. Patient not taking: Reported on 12/24/2022    [provider]  traZODone (DESYREL) 100 MG tablet Take 1 tablet (100 mg total) by mouth at bedtime. 12/17/21   Shon Hale, MD  zolpidem (AMBIEN) 5 MG tablet Take 5 mg by mouth at bedtime as needed for sleep.    [provider]      Allergies    Bee venom    Review of Systems   Review of Systems  All other systems reviewed and are negative.   Physical Exam Updated Vital Signs BP (!) 140/78   Pulse 89   Temp 98 F (36.7 C)   Resp 17   Ht 1.753 m (5\' 9" )   Wt 69.4 kg   SpO2 95%   BMI 22.59 kg/m  Physical Exam Vitals and nursing note reviewed.  Constitutional:      General: He is not in acute distress.    Appearance: He is well-developed.  HENT:     Head: Normocephalic and atraumatic.     Nose: No congestion or rhinorrhea.  Mouth/Throat:     Mouth: Mucous membranes are moist.     Pharynx: No oropharyngeal exudate.  Eyes:     General: No scleral icterus.       Right eye: No discharge.        Left eye: No discharge.     Conjunctiva/sclera: Conjunctivae normal.     Pupils: Pupils are equal, round, and reactive to light.  Neck:     Thyroid: No thyromegaly.     Vascular: No JVD.  Cardiovascular:     Rate and Rhythm: Normal rate and regular rhythm.     Heart sounds: Normal heart sounds. No murmur heard.    No friction rub. No gallop.  Pulmonary:     Effort: Pulmonary effort is normal. No respiratory distress.     Breath sounds: Normal breath sounds. No wheezing or rales.  Abdominal:     General: Bowel sounds are normal. There is no distension.     Palpations: Abdomen is soft. There is no mass.     Tenderness: There is abdominal tenderness.      Comments: Soft abdomen, no distention, no masses, tenderness in the left mid and upper abdomen, no guarding or peritoneal signs  Musculoskeletal:        General: No tenderness. Normal range of motion.     Cervical back: Normal range of motion and neck supple.     Right lower leg: No edema.     Left lower leg: No edema.  Lymphadenopathy:     Cervical: No cervical adenopathy.  Skin:    General: Skin is warm and dry.     Findings: No erythema or rash.  Neurological:     Mental Status: He is alert.     Coordination: Coordination normal (.milm).     Comments: The patient is able to get to the bathroom from the bed very slowly and seems to move all 4 extremities with good coordination as long as he is moving slowly.  His answers are slow to my questions but he is able to say things correctly, he is not confused, cranial nerves III through XII are normal  Psychiatric:        Behavior: Behavior normal.     ED Results / Procedures / Treatments   Labs (all labs ordered are listed, but only abnormal results are displayed) Labs Reviewed  LIPASE, BLOOD - Abnormal; Notable for the following components:      Result Value   Lipase 89 (*)    All other components within normal limits  COMPREHENSIVE METABOLIC PANEL - Abnormal; Notable for the following components:   Sodium 113 (*)    Potassium 3.0 (*)    Chloride 81 (*)    CO2 19 (*)    Glucose, Bld 101 (*)    Calcium 8.5 (*)    AST 58 (*)    All other components within normal limits  CBC - Abnormal; Notable for the following components:   RBC 3.27 (*)    Hemoglobin 11.6 (*)    HCT 32.3 (*)    MCH 35.5 (*)    All other components within normal limits  MAGNESIUM - Abnormal; Notable for the following components:   Magnesium 1.4 (*)    All other components within normal limits  URINALYSIS, ROUTINE W REFLEX MICROSCOPIC    EKG EKG Interpretation Date/Time:  Monday January 11 2023 16:34:35 EDT Ventricular Rate:  90 PR  Interval:  242 QRS Duration:  141 QT Interval:  401 QTC Calculation:  491 R Axis:   87  Text Interpretation: Sinus rhythm Prolonged PR interval Right bundle branch block Confirmed by Eber Hong ([id number]) on 01/11/2023 4:46:08 PM  Radiology CT ABDOMEN PELVIS W CONTRAST  Result Date: 01/11/2023 CLINICAL DATA:  Left upper quadrant abdominal pain. EXAM: CT ABDOMEN AND PELVIS WITH CONTRAST TECHNIQUE: Multidetector CT imaging of the abdomen and pelvis was performed using the standard protocol following bolus administration of intravenous contrast. RADIATION DOSE REDUCTION: This exam was performed according to the departmental dose-optimization program which includes automated exposure control, adjustment of the mA and/or kV according to patient size and/or use of iterative reconstruction technique. CONTRAST:  OMNIPAQUE IOHEXOL 300 MG/ML  SOLN COMPARISON:  CT abdomen pelvis dated 02/09/2022. FINDINGS: Lower chest: The visualized lung bases are clear. There is coronary vascular calcification. No intra-abdominal free air.  Trace free fluid within the pelvis. Hepatobiliary: Faint subcentimeter hypodense lesion in the left lobe of the liver stool small to characterize. The liver is otherwise unremarkable. No biliary dilatation. The gallbladder is unremarkable. Pancreas: Unremarkable. No pancreatic ductal dilatation or surrounding inflammatory changes. Spleen: Normal in size without focal abnormality. Adrenals/Urinary Tract: The adrenal glands unremarkable. There is no hydronephrosis on either side. There is symmetric enhancement and excretion of contrast by both kidneys. The visualized ureters and urinary bladder appear unremarkable. Stomach/Bowel: There is mild diffuse thickened appearance of the colon concerning for colitis. Clinical correlation is recommended. Loose stool within the colon consistent with diarrheal state. There is no bowel obstruction. The appendix is not visualized with certainty. No  inflammatory changes identified in the right lower quadrant. Vascular/Lymphatic: Moderate aortoiliac atherosclerotic disease. The IVC is unremarkable. No portal venous gas. There is no adenopathy. Reproductive: The prostate and seminal vesicles are grossly unremarkable. No pelvic mass. Other: None Musculoskeletal: Osteopenia.  No acute osseous pathology. IMPRESSION: 1. Findings concerning for colitis. No bowel obstruction. 2.  Aortic Atherosclerosis (ICD10-I70.0). Electronically Signed   By: Elgie Collard M.D.   On: 01/11/2023 16:31    Procedures .Critical Care  Performed by: Eber Hong, MD Authorized by: Eber Hong, MD   Critical care provider statement:    Critical care time (minutes):  45   Critical care time was exclusive of:  Separately billable procedures and treating other patients and teaching time   Critical care was necessary to treat or prevent imminent or life-threatening deterioration of the following conditions:  Dehydration and metabolic crisis   Critical care was time spent personally by me on the following activities:  Development of treatment plan with patient or surrogate, discussions with consultants, evaluation of patient's response to treatment, examination of patient, ordering and review of laboratory studies, ordering and review of radiographic studies, ordering and performing treatments and interventions, pulse oximetry, re-evaluation of patient's condition, review of old charts and obtaining history from patient or surrogate   I assumed direction of critical care for this patient from another provider in my specialty: no     Care discussed with: admitting provider   Comments:           Medications Ordered in ED Medications  0.9 %  sodium chloride infusion ( Intravenous New Bag/Given 01/11/23 1620)  potassium chloride 10 mEq in 100 mL IVPB (10 mEq Intravenous New Bag/Given 01/11/23 1621)  magnesium sulfate IVPB 2 g 50 mL (2 g Intravenous New Bag/Given 01/11/23  1622)  Ampicillin-Sulbactam (UNASYN) 3 g in sodium chloride 0.9 % 100 mL IVPB (has no administration in time range)  iohexol (OMNIPAQUE) 300 MG/ML solution 100  mL (100 mLs Intravenous Contrast Given 01/11/23 1617)    ED Course/ Medical Decision Making/ A&P                             Medical Decision Making Amount and/or Complexity of Data Reviewed Labs: ordered. Radiology: ordered. ECG/medicine tests: ordered.  Risk Prescription drug management. Decision regarding hospitalization.     This patient presents to the ED for concern of nausea vomiting diarrhea and weakness, this involves an extensive number of treatment options, and is a complaint that carries with it a high risk of complications and morbidity.  The differential diagnosis includes hyponatremia, could be related to bowel obstruction, cancer, dehydration, persistent diarrhea   Co morbidities that complicate the patient evaluation  Alcohol and tobacco use,   Additional history obtained:  Additional history obtained from medical record External records from outside source obtained and reviewed including gastroenterology notes, office visits, prior CT scan from last year   Lab Tests:  I Ordered, and personally interpreted labs.  The pertinent results include: Severe hyponatremia measuring 113, potassium of 3.0, chloride of 81, CO2 is 19, creatinine is 1.17 which is similar to what it was a week and a half ago.  LFTs are unremarkable, CBC without leukocytosis, hemoglobin is up from prior at 11.6   Imaging Studies ordered:  I ordered imaging studies including CT scan of the abdomen and pelvis I independently visualized and interpreted imaging which showed diffuse colitis I agree with the radiologist interpretation   Cardiac Monitoring: / EKG:  The patient was maintained on a cardiac monitor.  I personally viewed and interpreted the cardiac monitored which showed an underlying rhythm of: Normal sinus  rhythm   Consultations Obtained:  I requested consultation with the hospitalist,  and discussed lab and imaging findings as well as pertinent plan - they recommend: Admit to the hospital   Problem List / ED Course / Critical interventions / Medication management  The patient has what appears to be severe hyponatremia measuring 113 as well as hypochloremia.  This is likely related to diarrhea although could be multifactorial.  He is volume contracted and will need IV fluids which have been started in the ER.  Additionally antibiotics given for colitisReevaluation of the patient after these medicines showed that the patient improved I have reviewed the patients home medicines and have made adjustments as needed   Social Determinants of Health:  Continues to smoke and drink   Test / Admission - Considered:  Admit to a high level of care         Final Clinical Impression(s) / ED Diagnoses Final diagnoses:  Hyponatremia  Colitis    Rx / DC Orders ED Discharge Orders     None         Eber Hong, MD 01/11/23 1648

## 2023-01-12 ENCOUNTER — Telehealth: Payer: Self-pay

## 2023-01-12 DIAGNOSIS — E871 Hypo-osmolality and hyponatremia: Secondary | ICD-10-CM | POA: Diagnosis not present

## 2023-01-12 DIAGNOSIS — F1010 Alcohol abuse, uncomplicated: Secondary | ICD-10-CM | POA: Diagnosis not present

## 2023-01-12 DIAGNOSIS — K529 Noninfective gastroenteritis and colitis, unspecified: Secondary | ICD-10-CM | POA: Diagnosis not present

## 2023-01-12 LAB — GASTROINTESTINAL PANEL BY PCR, STOOL (REPLACES STOOL CULTURE)

## 2023-01-12 LAB — CBC
HCT: 31.1 % — ABNORMAL LOW (ref 39.0–52.0)
Hemoglobin: 10.8 g/dL — ABNORMAL LOW (ref 13.0–17.0)
MCH: 35 pg — ABNORMAL HIGH (ref 26.0–34.0)
MCHC: 34.7 g/dL (ref 30.0–36.0)
MCV: 100.6 fL — ABNORMAL HIGH (ref 80.0–100.0)
Platelets: 138 10*3/uL — ABNORMAL LOW (ref 150–400)
RBC: 3.09 MIL/uL — ABNORMAL LOW (ref 4.22–5.81)
RDW: 14.6 % (ref 11.5–15.5)
WBC: 4 10*3/uL (ref 4.0–10.5)
nRBC: 0 % (ref 0.0–0.2)

## 2023-01-12 LAB — BASIC METABOLIC PANEL
Anion gap: 8 (ref 5–15)
Anion gap: 8 (ref 5–15)
Anion gap: 9 (ref 5–15)
BUN: 8 mg/dL (ref 6–20)
BUN: 7 mg/dL (ref 6–20)
BUN: 7 mg/dL (ref 6–20)
CO2: 22 mmol/L (ref 22–32)
CO2: 22 mmol/L (ref 22–32)
CO2: 19 mmol/L — ABNORMAL LOW (ref 22–32)
Calcium: 8 mg/dL — ABNORMAL LOW (ref 8.9–10.3)
Calcium: 8.2 mg/dL — ABNORMAL LOW (ref 8.9–10.3)
Calcium: 8.2 mg/dL — ABNORMAL LOW (ref 8.9–10.3)
Chloride: 92 mmol/L — ABNORMAL LOW (ref 98–111)
Chloride: 96 mmol/L — ABNORMAL LOW (ref 98–111)
Chloride: 97 mmol/L — ABNORMAL LOW (ref 98–111)
Creatinine, Ser: 1 mg/dL (ref 0.61–1.24)
Creatinine, Ser: 1.05 mg/dL (ref 0.61–1.24)
Creatinine, Ser: 0.92 mg/dL (ref 0.61–1.24)
GFR, Estimated: >60 mL/min (ref >60)
GFR, Estimated: >60 mL/min (ref >60)
GFR, Estimated: >60 mL/min (ref >60)
Glucose, Bld: 120 mg/dL — ABNORMAL HIGH (ref 70–99)
Glucose, Bld: 101 mg/dL — ABNORMAL HIGH (ref 70–99)
Glucose, Bld: 127 mg/dL — ABNORMAL HIGH (ref 70–99)
Potassium: 3.4 mmol/L — ABNORMAL LOW (ref 3.5–5.1)
Potassium: 3.7 mmol/L (ref 3.5–5.1)
Potassium: 3.7 mmol/L (ref 3.5–5.1)
Sodium: 122 mmol/L — ABNORMAL LOW (ref 135–145)
Sodium: 126 mmol/L — ABNORMAL LOW (ref 135–145)
Sodium: 125 mmol/L — ABNORMAL LOW (ref 135–145)

## 2023-01-12 LAB — MRSA NEXT GEN BY PCR, NASAL: MRSA by PCR Next Gen: NOT DETECTED

## 2023-01-12 LAB — MAGNESIUM: Magnesium: 2.3 mg/dL (ref 1.7–2.4)

## 2023-01-12 LAB — HIV ANTIBODY (ROUTINE TESTING W REFLEX): HIV Screen 4th Generation wRfx: NONREACTIVE

## 2023-01-12 MED ORDER — CYANOCOBALAMIN 1000 MCG/ML IJ SOLN
1000.0000 ug | Freq: Once | INTRAMUSCULAR | Status: AC
  Administered 2023-01-12: 1000 ug via INTRAMUSCULAR
  Filled 2023-01-12: qty 1

## 2023-01-12 MED ORDER — ORAL CARE MOUTH RINSE: 15.0000 mL | OROMUCOSAL | Status: DC | PRN

## 2023-01-12 MED ORDER — POTASSIUM CHLORIDE CRYS ER 20 MEQ PO TBCR
40.0000 meq | EXTENDED_RELEASE_TABLET | ORAL | Status: AC
  Administered 2023-01-12 (×2): 40 meq via ORAL
  Filled 2023-01-12 (×2): qty 2

## 2023-01-12 MED ORDER — NICOTINE 21 MG/24HR TD PT24
21.0000 mg | MEDICATED_PATCH | Freq: Every day | TRANSDERMAL | Status: DC
  Administered 2023-01-12 – 2023-01-18 (×7): 21 mg via TRANSDERMAL
  Filled 2023-01-12 (×7): qty 1

## 2023-01-12 MED ORDER — CHLORHEXIDINE GLUCONATE CLOTH 2 % EX PADS
6.0000 | MEDICATED_PAD | Freq: Every day | CUTANEOUS | Status: DC
  Administered 2023-01-12 – 2023-01-18 (×6): 6 via TOPICAL

## 2023-01-12 NOTE — Progress Notes (Signed)
Pt refused hourly bp readings agreeable to q 4 hour bp readings, pt refused hospital gown.

## 2023-01-12 NOTE — TOC Initial Note (Signed)
Transition of Care [facility]) - Initial/Assessment Note    Patient Details  Name: Gabriel Koch MRN: [id number] Date of Birth: Feb 08, 1966  Transition of Care [facility]) CM/SW Contact:    Annice Needy, LCSW Phone Number: 01/12/2023, 3:05 PM  Clinical Narrative:                 [facility] consulted for SA resources. Discussed with patient. Patient states that he is not interested in "no treatment." TOC signing off.   Expected Discharge Plan: Home/Self Care Barriers to Discharge: Continued Medical Work up   Patient Goals and CMS Choice Patient states their goals for this hospitalization and ongoing recovery are:: return home          Expected Discharge Plan and Services                                              Prior Living Arrangements/Services     Patient language and need for interpreter reviewed:: Yes Do you feel safe going back to the place where you live?: Yes      Need for Family Participation in Patient Care: Yes (Comment) Care giver support system in place?: Yes (comment)   Criminal Activity/Legal Involvement Pertinent to Current Situation/Hospitalization: No - Comment as needed  Activities of Daily Living Home Assistive Devices/Equipment: None ADL Screening (condition at time of admission) Patient's cognitive ability adequate to safely complete daily activities?: Yes Is the patient deaf or have difficulty hearing?: No Does the patient have difficulty seeing, even when wearing glasses/contacts?: No Does the patient have difficulty concentrating, remembering, or making decisions?: No Patient able to express need for assistance with ADLs?: Yes Does the patient have difficulty dressing or bathing?: No Independently performs ADLs?: Yes (appropriate for developmental age) Does the patient have difficulty walking or climbing stairs?: No Weakness of Legs: Both Weakness of Arms/Hands: Both  Permission Sought/Granted                  Emotional  Assessment     Affect (typically observed): Appropriate Orientation: : Oriented to Self, Oriented to Place, Oriented to  Time, Oriented to Situation Alcohol / Substance Use: Alcohol Use Psych Involvement: No (comment)  Admission diagnosis:  Colitis [K52.9] Hyponatremia [E87.1] Patient Active Problem List   Diagnosis Date Noted   Colitis 01/11/2023   Generalized abdominal pain 12/27/2022   Rib deformity 12/27/2022   Anemia 12/27/2022   Loss of weight 12/27/2022   Diarrhea 12/27/2022   Prolapsed internal hemorrhoids, grade 2 03/26/2022   Barrett's esophagus 03/03/2022   Hyponatremia 12/14/2021   Discoloration of skin of flank resembling ecchymosis 12/14/2021   Coronary artery calcification 12/14/2021   Tobacco abuse 04/30/2020   Alcohol abuse 04/30/2020   Noncompliance with diet and medication regimen 04/30/2020   Left arm numbness 04/29/2020   Prolonged QT interval 04/29/2020   Hypokalemia 04/29/2020   Hypomagnesemia 04/29/2020   Stroke (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 04/29/2020   Facial weakness 04/28/2020   HTN (hypertension) 01/20/2020   GERD (gastroesophageal reflux disease) 01/19/2020   Vomiting 01/19/2020   Rectal bleeding 01/19/2020   Alternating constipation and diarrhea 01/19/2020   PCP:  Elfredia Nevins, MD Pharmacy:   [facility] - [place], [place] - [address] [place] [address] Phone: [phone] Fax: [phone]     Social Determinants of Health (  SDOH) Social History: SDOH Screenings   Food Insecurity: No Food Insecurity (01/11/2023)  Housing: Low Risk  (01/11/2023)  Transportation Needs: No Transportation Needs (01/11/2023)  Utilities: Not At Risk (01/11/2023)  Tobacco Use: High Risk (01/11/2023)   SDOH Interventions:     Readmission Risk Interventions     No data to display

## 2023-01-12 NOTE — Telephone Encounter (Signed)
Outside labs are in box in office for review.

## 2023-01-12 NOTE — Progress Notes (Signed)
Progress Note   Patient: Gabriel Koch DOB: Jun 15, 1966 DOA: 01/11/2023     1 DOS: the patient was seen and examined on 01/12/2023   Brief hospital admission narrative: Gabriel Koch is a 57 y.o. male with medical history significant for alcohol abuse, hypertension, CVA, COPD, PTSD. Patient presented to the ED with complaints of vomiting and diarrhea with abdominal pain.  He reports symptoms started about 2 to 3 weeks ago.  Abdominal pain is diffuse.  Reports diarrhea up to 8-9 times a day, and vomiting about 2-3 times daily.  Reports generalized weakness, lightheadedness.   Saw outpatient provider, about 10 days ago, and was referred to the ED because his sodium was low- 122 but patient did not go.   ED Course: Temperature 98.2.  Heart rate 89-104.  Respirate rate 16-22.  Blood pressure systolic 1 23-1 50.  O2 sats greater than 95% on room air.   Sodium 113.  Potassium 3.  Magnesium 1.4.  Mild AST elevation at 58.  CT abdomen and pelvis suggest mild diffuse colitis. IV Unasyn started. N/s at 250cc/hr started.  Assessment and Plan: * Hyponatremia -Sodium 113, baseline 129-133 over the past year.  - Likely due to GI loss and history of alcohol abuse -Continue fluid resuscitation and follow clinical response..   Colitis -Presenting with vomiting, diarrhea, abdominal pain, multiple electrolyte abnormalities. -Reports feeling better and noticing improvement in his GI symptoms with also a slowdown in diarrhea -Recent GI panel as an outpatient negative and repeat C. difficile at time of admission negative as well -Continue as needed loperamide -Continue empirical antibiotic therapy -Continue advancing diet as tolerated to maintain adequate hydration -Follow clinical response. -Positive colitis appreciated on patient's CT abdomen.  Alcohol abuse -Drinks 2-4   ounce beers daily.   -No active withdrawal symptoms currently appreciated -Continue CIWA protocol as  needed -Thiamine and folic acid will be provided -Recent B12 level from outpatient demonstrating to be on the low normal side; one-time dose intramuscular injection will be provided along with daily oral supplementation.    Hypomagnesemia -Repleted and currently within normal limits-continue to follow ultralights trend.  Hypokalemia -In the setting of GI loss -Magnesium within normal limits; continue to replete potassium and follow trend.   -Continue telemetry monitoring for another 24 hours.   HTN (hypertension) Stable vital without the use of medication -Continue to follow vital sign -Follow patient's med rec once completed with intention to resume any antihypertensive agents previously taking as long as required.   Subjective:  Afebrile, no chest pain, no nausea, no vomiting.  Reports improvement in his diarrhea and is feeling hungry.  Would like diet further advanced.  Physical Exam: Vitals:   01/12/23 0505 01/12/23 0700 01/12/23 0748 01/12/23 0800  BP:      Pulse:  75  89  Resp:  15  16  Temp: 98.5 F (36.9 C)  98 F (36.7 C)   TempSrc: Oral  Oral   SpO2:  97%  98%  Weight: 65.3 kg     Height:       General exam: Afebrile, no nausea or vomiting; reports feeling better and noticing a slowdown in his diarrhea.  He is hungry and would like to have diet further advance. Respiratory system: Clear to auscultation. Respiratory effort normal.  Good saturation on room air. Cardiovascular system:RRR. No rubs or gallops: No JVD. Gastrointestinal system: Abdomen is nondistended, soft and mildly tender on palpation. No organomegaly or masses felt. Normal bowel sounds heard. Central  nervous system: No focal neurological deficits. Extremities: No cyanosis or clubbing; no edema. Skin: No petechiae. Psychiatry: Mood & affect appropriate.   Data Reviewed: CBC: White blood cells 4.0 hemoglobin 10.8 platelet count 138 K Basic metabolic panel: Sodium 125, potassium 3.7, chloride 97,  bicarb 19, BUN 7, creatinine 0.92 and GFR >60 Magnesium: 2.3  Family Communication: No family at bedside.  Disposition: Status is: Inpatient Remains inpatient appropriate because: Continue IV antibiotics, fluid resuscitation and electrolyte repletion.   Planned Discharge Destination: Home   Time spent: 50 minutes  Author: Vassie Loll, MD 01/12/2023 11:10 AM  For on call review [email or web address].

## 2023-01-13 DIAGNOSIS — E871 Hypo-osmolality and hyponatremia: Secondary | ICD-10-CM | POA: Diagnosis not present

## 2023-01-13 LAB — BASIC METABOLIC PANEL
Anion gap: 8 (ref 5–15)
BUN: 5 mg/dL — ABNORMAL LOW (ref 6–20)
CO2: 21 mmol/L — ABNORMAL LOW (ref 22–32)
Calcium: 8.4 mg/dL — ABNORMAL LOW (ref 8.9–10.3)
Chloride: 99 mmol/L (ref 98–111)
Creatinine, Ser: 0.87 mg/dL (ref 0.61–1.24)
GFR, Estimated: >60 mL/min (ref >60)
Glucose, Bld: 108 mg/dL — ABNORMAL HIGH (ref 70–99)
Potassium: 3.8 mmol/L (ref 3.5–5.1)
Sodium: 128 mmol/L — ABNORMAL LOW (ref 135–145)

## 2023-01-13 LAB — CBC
HCT: 33.1 % — ABNORMAL LOW (ref 39.0–52.0)
Hemoglobin: 11.3 g/dL — ABNORMAL LOW (ref 13.0–17.0)
MCH: 35.1 pg — ABNORMAL HIGH (ref 26.0–34.0)
MCHC: 34.1 g/dL (ref 30.0–36.0)
MCV: 102.8 fL — ABNORMAL HIGH (ref 80.0–100.0)
Platelets: 150 10*3/uL (ref 150–400)
RBC: 3.22 MIL/uL — ABNORMAL LOW (ref 4.22–5.81)
RDW: 14.9 % (ref 11.5–15.5)
WBC: 3.7 10*3/uL — ABNORMAL LOW (ref 4.0–10.5)
nRBC: 0 % (ref 0.0–0.2)

## 2023-01-13 LAB — MAGNESIUM: Magnesium: 1.8 mg/dL (ref 1.7–2.4)

## 2023-01-13 MED ORDER — AMLODIPINE BESYLATE 5 MG PO TABS
5.0000 mg | ORAL_TABLET | Freq: Every day | ORAL | Status: DC
  Administered 2023-01-13 – 2023-01-15 (×3): 5 mg via ORAL
  Filled 2023-01-13 (×3): qty 1

## 2023-01-13 MED ORDER — VITAMIN B-12 1000 MCG PO TABS
1000.0000 ug | ORAL_TABLET | Freq: Every day | ORAL | Status: DC
  Administered 2023-01-15 – 2023-01-18 (×4): 1000 ug via ORAL
  Filled 2023-01-13 (×4): qty 1

## 2023-01-13 MED ORDER — SODIUM CHLORIDE 0.9 % IV SOLN: INTRAVENOUS | Status: DC

## 2023-01-13 MED ORDER — CYANOCOBALAMIN 1000 MCG/ML IJ SOLN
1000.0000 ug | Freq: Once | INTRAMUSCULAR | Status: AC
  Administered 2023-01-13: 1000 ug via INTRAMUSCULAR
  Filled 2023-01-13: qty 1

## 2023-01-13 MED ORDER — DIPHENOXYLATE-ATROPINE 2.5-0.025 MG PO TABS
1.0000 | ORAL_TABLET | Freq: Four times a day (QID) | ORAL | Status: AC
  Administered 2023-01-13 – 2023-01-14 (×8): 1 via ORAL
  Filled 2023-01-13 (×8): qty 1

## 2023-01-13 MED ORDER — LABETALOL HCL 5 MG/ML IV SOLN: 10.0000 mg | INTRAVENOUS | Status: DC | PRN

## 2023-01-13 NOTE — Progress Notes (Signed)
  Progress Note   Patient: Gabriel Koch:[id number] DOB: 09-28-1965 DOA: 01/11/2023     2 DOS: the patient was seen and examined on 01/13/2023   Brief hospital admission narrative: Gabriel Koch is a 57 y.o. male with medical history significant for alcohol abuse, hypertension, CVA, COPD, PTSD. Patient presented to the ED with complaints of vomiting and diarrhea with abdominal pain.  He reports symptoms started about 2 to 3 weeks ago.  Abdominal pain is diffuse.  Reports diarrhea up to 8-9 times a day, and vomiting about 2-3 times daily.  Reports generalized weakness, lightheadedness.   Saw outpatient provider, about 10 days ago, and was referred to the ED because his sodium was low- 122 but patient did not go.   ED Course: Temperature 98.2.  Heart rate 89-104.  Respirate rate 16-22.  Blood pressure systolic 1 23-1 50.  O2 sats greater than 95% on room air.   Sodium 113.  Potassium 3.  Magnesium 1.4.  Mild AST elevation at 58.  CT abdomen and pelvis suggest mild diffuse colitis. IV Unasyn started. N/s at 250cc/hr started.  Assessment and Plan: 1)HypoNatremia -Sodium was 113 on admission, baseline 129-133 over the past year.  - Likely due to GI loss and history of alcohol abuse with beer potomania and poor oral intake -Now up to 128  2)Colitis -Presenting with vomiting, diarrhea, abdominal pain, multiple electrolyte abnormalities. -Recent GI panel as an outpatient negative and repeat C. difficile at time of admission negative as well -Currently on IV Unasyn -Diarrhea persist despite Imodium -Okay to try Lomotil -Advance diet as tolerated  4)Alcohol abuse -Drinks > 4  beers daily, on most days.   -Continue benzos per CIWA protocol as needed -Thiamine and folic acid will be provided -Recent B12 level from outpatient demonstrating to be on the low normal side; one-time dose intramuscular injection will be provided along with daily oral supplementation.     5)Hypomagnesemia/hypokalemia -Due to GI losses -Start with replacements  6)HTN (hypertension) -Stop losartan -Okay to add amlodipine -IV labetalol as needed   Subjective:  -Watery stools persist, no emesis -Complains of hunger, would like solid food -Ambulated with staff around the unit No fever  Or chills   Physical Exam: Vitals:   01/13/23 1544 01/13/23 1600 01/13/23 1700 01/13/23 1744  BP:    (!) 177/81  Pulse:      Resp:  19 20 19   Temp: 98 F (36.7 C)     TempSrc: Oral     SpO2:      Weight:      Height:       Physical Exam  Gen:- Awake Alert, in no acute distress  HEENT:- [place].AT, No sclera icterus Neck-Supple Neck,No JVD,.  Lungs-  CTAB , fair air movement bilaterally  CV- S1, S2 normal, RRR Abd-  +ve B.Sounds, Abd Soft, No tenderness,    Extremity/Skin:- No  edema,   good pedal pulses  Psych-affect is appropriate, oriented x3 Neuro-generalized weakness, no new focal deficits, +ve tremors  Family Communication: No family at bedside.  Disposition:--Anticipate discharge home after resolution of acute medical issues Status is: Inpatient Remains inpatient appropriate because: Continue IV antibiotics, fluid resuscitation and electrolyte repletion.   Planned Discharge Destination: Home  Author: Shon Hale, MD 01/13/2023 6:10 PM  For on call review [email or web address].

## 2023-01-13 NOTE — Plan of Care (Signed)
  Problem: Education: Goal: Knowledge of General Education information will improve Description: Including pain rating scale, medication(s)/side effects and non-pharmacologic comfort measures Outcome: Progressing   Problem: Health Behavior/Discharge Planning: Goal: Ability to manage health-related needs will improve Outcome: Progressing   Problem: Clinical Measurements: Goal: Ability to maintain clinical measurements within normal limits will improve Outcome: Progressing Goal: Will remain free from infection Outcome: Progressing Goal: Diagnostic test results will improve Outcome: Progressing Goal: Respiratory complications will improve Outcome: Progressing Goal: Cardiovascular complication will be avoided Outcome: Progressing   Problem: Activity: Goal: Risk for activity intolerance will decrease Outcome: Progressing   Problem: Nutrition: Goal: Adequate nutrition will be maintained Outcome: Progressing   Problem: Coping: Goal: Level of anxiety will decrease Outcome: Progressing   Problem: Elimination: Goal: Will not experience complications related to bowel motility Outcome: Progressing Goal: Will not experience complications related to urinary retention Outcome: Progressing   Problem: Pain Managment: Goal: General experience of comfort will improve Outcome: Progressing   Problem: Safety: Goal: Ability to remain free from injury will improve Outcome: Progressing   Problem: Skin Integrity: Goal: Risk for impaired skin integrity will decrease Outcome: Progressing   Problem: Education: Goal: Knowledge of disease or condition will improve Outcome: Progressing Goal: Understanding of discharge needs will improve Outcome: Progressing   Problem: Health Behavior/Discharge Planning: Goal: Ability to identify changes in lifestyle to reduce recurrence of condition will improve Outcome: Progressing Goal: Identification of resources available to assist in meeting health  care needs will improve Outcome: Progressing   Problem: Safety: Goal: Ability to remain free from injury will improve Outcome: Progressing Patient orientated able to toilet self with min assist

## 2023-01-13 NOTE — Telephone Encounter (Signed)
Reviewed.  His labs were completed 01/01/2023, the same day that patient completed labs for me.  I have reviewed all labs.  Abnormalities include hemoglobin low at 12.0 with macrocytic indices, low B12 at 215, sodium low at 121, AST elevated at 80, ALT elevated at 73.  TSH and vitamin D within normal limits.  Patient is currently admitted to the hospital with colitis and hyponatremia.

## 2023-01-13 NOTE — Progress Notes (Signed)
1905 patient alert and orientated able to make all needs known 2200 all meds given as ordered

## 2023-01-13 NOTE — Progress Notes (Signed)
Pt ambulated 1 lap around nurses station. Tolerated well, though patient says this is very below his baseline as he is not normally this slow. Will continue to monitor.

## 2023-01-14 DIAGNOSIS — E871 Hypo-osmolality and hyponatremia: Secondary | ICD-10-CM | POA: Diagnosis not present

## 2023-01-14 LAB — RENAL FUNCTION PANEL
Albumin: 3.2 g/dL — ABNORMAL LOW (ref 3.5–5.0)
Anion gap: 10 (ref 5–15)
BUN: 5 mg/dL — ABNORMAL LOW (ref 6–20)
CO2: 22 mmol/L (ref 22–32)
Calcium: 8.3 mg/dL — ABNORMAL LOW (ref 8.9–10.3)
Chloride: 98 mmol/L (ref 98–111)
Creatinine, Ser: 0.81 mg/dL (ref 0.61–1.24)
GFR, Estimated: >60 mL/min (ref >60)
Glucose, Bld: 108 mg/dL — ABNORMAL HIGH (ref 70–99)
Phosphorus: 3.4 mg/dL (ref 2.5–4.6)
Potassium: 3.4 mmol/L — ABNORMAL LOW (ref 3.5–5.1)
Sodium: 130 mmol/L — ABNORMAL LOW (ref 135–145)

## 2023-01-14 MED ORDER — POTASSIUM CHLORIDE CRYS ER 20 MEQ PO TBCR
40.0000 meq | EXTENDED_RELEASE_TABLET | ORAL | Status: AC
  Administered 2023-01-14 (×2): 40 meq via ORAL
  Filled 2023-01-14 (×2): qty 2

## 2023-01-14 MED ORDER — AMOXICILLIN-POT CLAVULANATE 875-125 MG PO TABS
1.0000 | ORAL_TABLET | Freq: Two times a day (BID) | ORAL | Status: DC
  Administered 2023-01-14 – 2023-01-15 (×3): 1 via ORAL
  Filled 2023-01-14 (×3): qty 1

## 2023-01-14 MED ORDER — CHLORDIAZEPOXIDE HCL 5 MG PO CAPS
10.0000 mg | ORAL_CAPSULE | Freq: Three times a day (TID) | ORAL | Status: DC
  Administered 2023-01-14 (×3): 10 mg via ORAL
  Filled 2023-01-14 (×4): qty 2

## 2023-01-14 NOTE — Progress Notes (Addendum)
  Progress Note   Patient: Gabriel Koch ZOX:[id number] DOB: 1966-02-10 DOA: 01/11/2023     3 DOS: the patient was seen and examined on 01/14/2023   Brief hospital admission narrative: Gabriel Koch is a 57 y.o. male with medical history significant for alcohol abuse, hypertension, CVA, COPD, PTSD. Patient presented to the ED with complaints of vomiting and diarrhea with abdominal pain.  He reports symptoms started about 2 to 3 weeks ago.  Abdominal pain is diffuse.  Reports diarrhea up to 8-9 times a day, and vomiting about 2-3 times daily.  Reports generalized weakness, lightheadedness.   Saw outpatient provider, about 10 days ago, and was referred to the ED because his sodium was low- 122 but patient did not go.   ED Course: Temperature 98.2.  Heart rate 89-104.  Respirate rate 16-22.  Blood pressure systolic 1 23-1 50.  O2 sats greater than 95% on room air.   Sodium 113.  Potassium 3.  Magnesium 1.4.  Mild AST elevation at 58.  CT abdomen and pelvis suggest mild diffuse colitis. IV Unasyn started. N/s at 250cc/hr started.  Assessment and Plan: 1)HypoNatremia -Sodium was 113 on admission, baseline 129-133 over the past year.  - Likely due to GI loss and history of alcohol abuse with beer potomania and poor oral intake -Now up to 130  2)Colitis -Presenting with vomiting, diarrhea, abdominal pain, multiple electrolyte abnormalities. -Recent GI panel as an outpatient negative and repeat C. difficile at time of admission negative as well -Diarrhea persist despite Imodium Lomotil as ordered -Advance diet as tolerated -Frequency , consistency and volume of stools improving -Treated with Unasyn okay to change to Augmentin  4)Alcohol abuse -Drinks > 4  beers daily, on most days.   -Continue benzos per CIWA protocol as needed -Thiamine and folic acid will be provided -Recent B12 level from outpatient demonstrating to be on the low normal side; one-time dose intramuscular injection  will be provided along with daily oral supplementation.    5)Hypomagnesemia/hypokalemia -Due to GI losses -Continue to replace electrolytes  6)HTN (hypertension) -Stopped losartan - added amlodipine -IV labetalol as needed   Subjective:  Oral intake is fair -Diarrhea improving----Frequency , consistency and volume of stools improving -Complains of generalized weakness and fatigue No fever  Or chills   Physical Exam: Vitals:   01/14/23 0400 01/14/23 0419 01/14/23 0506 01/14/23 0743  BP: (!) 178/78     Pulse:      Resp: (!) 21     Temp:  98.9 F (37.2 C)  98.4 F (36.9 C)  TempSrc:  Axillary  Oral  SpO2:      Weight:   66.9 kg   Height:       Physical Exam  Gen:- Awake Alert, in no acute distress  HEENT:- [place].AT, No sclera icterus Neck-Supple Neck,No JVD,.  Lungs-  CTAB , fair air movement bilaterally  CV- S1, S2 normal, RRR Abd-  +ve B.Sounds, Abd Soft, No tenderness,    Extremity/Skin:- No  edema,   good pedal pulses  Psych-affect is appropriate, oriented x3 Neuro-generalized weakness, no new focal deficits, +ve tremors  Family Communication: No family at bedside.  Disposition:--- Possible discharge home in 1 to 2 days Status is: Inpatient   Planned Discharge Destination: Home  Author: Shon Hale, MD 01/14/2023 10:47 AM  For on call review [email or web address].

## 2023-01-15 DIAGNOSIS — E871 Hypo-osmolality and hyponatremia: Secondary | ICD-10-CM | POA: Diagnosis not present

## 2023-01-15 LAB — BASIC METABOLIC PANEL
Anion gap: 9 (ref 5–15)
BUN: 5 mg/dL — ABNORMAL LOW (ref 6–20)
CO2: 20 mmol/L — ABNORMAL LOW (ref 22–32)
Calcium: 8.1 mg/dL — ABNORMAL LOW (ref 8.9–10.3)
Chloride: 100 mmol/L (ref 98–111)
Creatinine, Ser: 0.77 mg/dL (ref 0.61–1.24)
GFR, Estimated: >60 mL/min (ref >60)
Glucose, Bld: 96 mg/dL (ref 70–99)
Potassium: 3.2 mmol/L — ABNORMAL LOW (ref 3.5–5.1)
Sodium: 129 mmol/L — ABNORMAL LOW (ref 135–145)

## 2023-01-15 LAB — GLUCOSE, CAPILLARY
Glucose-Capillary: 119 mg/dL — ABNORMAL HIGH (ref 70–99)
Glucose-Capillary: 136 mg/dL — ABNORMAL HIGH (ref 70–99)

## 2023-01-15 LAB — MAGNESIUM: Magnesium: 1.1 mg/dL — ABNORMAL LOW (ref 1.7–2.4)

## 2023-01-15 MED ORDER — MAGNESIUM SULFATE 4 GM/100ML IV SOLN
4.0000 g | Freq: Once | INTRAVENOUS | Status: AC
  Administered 2023-01-15: 4 g via INTRAVENOUS
  Filled 2023-01-15: qty 100

## 2023-01-15 MED ORDER — CHLORDIAZEPOXIDE HCL 5 MG PO CAPS
10.0000 mg | ORAL_CAPSULE | Freq: Three times a day (TID) | ORAL | Status: AC
  Administered 2023-01-15 – 2023-01-17 (×7): 10 mg via ORAL
  Filled 2023-01-15 (×6): qty 2

## 2023-01-15 MED ORDER — LORAZEPAM 2 MG/ML IJ SOLN
1.0000 mg | INTRAMUSCULAR | Status: AC | PRN
  Administered 2023-01-15 – 2023-01-16 (×12): 2 mg via INTRAVENOUS
  Filled 2023-01-15 (×12): qty 1

## 2023-01-15 MED ORDER — ONDANSETRON HCL 4 MG/2ML IJ SOLN
4.0000 mg | Freq: Three times a day (TID) | INTRAMUSCULAR | Status: DC | PRN
  Administered 2023-01-15 – 2023-01-16 (×3): 4 mg via INTRAVENOUS
  Filled 2023-01-15 (×3): qty 2

## 2023-01-15 MED ORDER — AMLODIPINE BESYLATE 5 MG PO TABS
10.0000 mg | ORAL_TABLET | Freq: Every day | ORAL | Status: DC
  Administered 2023-01-15: 5 mg via ORAL
  Administered 2023-01-16 – 2023-01-18 (×3): 10 mg via ORAL
  Filled 2023-01-15 (×4): qty 2

## 2023-01-15 MED ORDER — LORAZEPAM 1 MG PO TABS: 1.0000 mg | ORAL_TABLET | ORAL | Status: AC | PRN

## 2023-01-15 MED ORDER — AMOXICILLIN-POT CLAVULANATE 875-125 MG PO TABS
1.0000 | ORAL_TABLET | Freq: Two times a day (BID) | ORAL | Status: AC
  Administered 2023-01-15 – 2023-01-18 (×6): 1 via ORAL
  Filled 2023-01-15 (×6): qty 1

## 2023-01-15 MED ORDER — POTASSIUM CHLORIDE CRYS ER 20 MEQ PO TBCR
40.0000 meq | EXTENDED_RELEASE_TABLET | ORAL | Status: AC
  Administered 2023-01-15 (×2): 40 meq via ORAL
  Filled 2023-01-15 (×2): qty 2

## 2023-01-15 NOTE — Progress Notes (Signed)
SA resources/education offered to patient again. Patient declines again.    Gabriel Koch, Juleen China, LCSW

## 2023-01-15 NOTE — Progress Notes (Signed)
   01/15/23 1611  Vitals  Temp 98.4 F (36.9 C)  Temp Source Oral  BP 133/77  MAP (mmHg) 94  BP Method Automatic  Pulse Rate (!) 103  Pulse Rate Source Monitor  Level of Consciousness  Level of Consciousness Alert  MEWS COLOR  MEWS Score Color Yellow  Oxygen Therapy  SpO2 97 %  MEWS Score  MEWS Temp 0  MEWS Systolic 0  MEWS Pulse 1  MEWS RR 1  MEWS LOC 0  MEWS Score 2   Vitals in a resting state. Dr Marisa Severin came to assess patient, CIWA in place.

## 2023-01-15 NOTE — Progress Notes (Signed)
Patient given a Boost nutritional drink. Requested after declining earlier.

## 2023-01-15 NOTE — Plan of Care (Signed)

## 2023-01-15 NOTE — Plan of Care (Signed)
?  Problem: Education: ?Goal: Knowledge of General Education information will improve ?Description: Including pain rating scale, medication(s)/side effects and non-pharmacologic comfort measures ?Outcome: Not Progressing ?  ?Problem: Health Behavior/Discharge Planning: ?Goal: Ability to manage health-related needs will improve ?Outcome: Not Progressing ?  ?Problem: Clinical Measurements: ?Goal: Ability to maintain clinical measurements within normal limits will improve ?Outcome: Not Progressing ?Goal: Will remain free from infection ?Outcome: Not Progressing ?Goal: Diagnostic test results will improve ?Outcome: Not Progressing ?Goal: Respiratory complications will improve ?Outcome: Not Progressing ?Goal: Cardiovascular complication will be avoided ?Outcome: Not Progressing ?  ?Problem: Activity: ?Goal: Risk for activity intolerance will decrease ?Outcome: Not Progressing ?  ?Problem: Nutrition: ?Goal: Adequate nutrition will be maintained ?Outcome: Not Progressing ?  ?Problem: Coping: ?Goal: Level of anxiety will decrease ?Outcome: Not Progressing ?  ?Problem: Elimination: ?Goal: Will not experience complications related to bowel motility ?Outcome: Not Progressing ?Goal: Will not experience complications related to urinary retention ?Outcome: Not Progressing ?  ?Problem: Pain Managment: ?Goal: General experience of comfort will improve ?Outcome: Not Progressing ?  ?Problem: Safety: ?Goal: Ability to remain free from injury will improve ?Outcome: Not Progressing ?  ?Problem: Skin Integrity: ?Goal: Risk for impaired skin integrity will decrease ?Outcome: Not Progressing ?  ?Problem: Education: ?Goal: Knowledge of disease or condition will improve ?Outcome: Not Progressing ?Goal: Understanding of discharge needs will improve ?Outcome: Not Progressing ?  ?Problem: Health Behavior/Discharge Planning: ?Goal: Ability to identify changes in lifestyle to reduce recurrence of condition will improve ?Outcome: Not  Progressing ?Goal: Identification of resources available to assist in meeting health care needs will improve ?Outcome: Not Progressing ?  ?Problem: Physical Regulation: ?Goal: Complications related to the disease process, condition or treatment will be avoided or minimized ?Outcome: Not Progressing ?  ?Problem: Safety: ?Goal: Ability to remain free from injury will improve ?Outcome: Not Progressing ?  ?

## 2023-01-15 NOTE — Progress Notes (Signed)
Patient questions addressed. Patient complained of pain and was having visible tremors. Patient asked "what we were doing about his alcohol", explained again that we were giving ativan to assist with his withdrawal. Patient asking many of the same questions; Dr. Marisa Severin to round.

## 2023-01-15 NOTE — Progress Notes (Signed)
   01/15/23 1556  Assess: MEWS Score  BP (!) 155/78  Pulse Rate (!) 102  Assess: MEWS Score  MEWS Temp 0  MEWS Systolic 0  MEWS Pulse 1  MEWS RR 1  MEWS LOC 0  MEWS Score 2  MEWS Score Color Yellow  Notify: Charge Nurse/RN  Name of Charge Nurse/RN Notified Chales Abrahams H  Provider Notification  Provider Name/Title Dr. Marisa Severin  Date Provider Notified 01/15/23  Time Provider Notified 1603  Method of Notification Page  Assess: SIRS CRITERIA  SIRS Temperature  0  SIRS Pulse 1  SIRS Respirations  1  SIRS WBC 0  SIRS Score Sum  2

## 2023-01-15 NOTE — Progress Notes (Signed)
Progress Note   Patient: Gabriel Koch:[id number] DOB: September 30, 1965 DOA: 01/11/2023     4 DOS: the patient was seen and examined on 01/15/2023   Brief hospital admission narrative: Gabriel Koch is a 57 y.o. male with medical history significant for alcohol abuse, hypertension, CVA, COPD, PTSD. Patient presented to the ED with complaints of vomiting and diarrhea with abdominal pain.  He reports symptoms started about 2 to 3 weeks ago.  Abdominal pain is diffuse.  Reports diarrhea up to 8-9 times a day, and vomiting about 2-3 times daily.  Reports generalized weakness, lightheadedness.   Saw outpatient provider, about 10 days ago, and was referred to the ED because his sodium was low- 122 but patient did not go.   ED Course: Temperature 98.2.  Heart rate 89-104.  Respirate rate 16-22.  Blood pressure systolic 1 23-1 50.  O2 sats greater than 95% on room air.   Sodium 113.  Potassium 3.  Magnesium 1.4.  Mild AST elevation at 58.  CT abdomen and pelvis suggest mild diffuse colitis. IV Unasyn started. N/s at 250cc/hr started.  Assessment and Plan: 1)HypoNatremia -Sodium was 113 on admission, baseline 129-133 over the past year.  - Likely due to GI loss and history of alcohol abuse with beer potomania and poor oral intake -Now up to 129  2)Colitis -Presenting with vomiting, diarrhea, abdominal pain, multiple electrolyte abnormalities. -Recent GI panel as an outpatient negative and repeat C. difficile at time of admission negative as well -Diarrhea persist despite Imodium Lomotil as ordered -Tolerating solids -Frequency , consistency and volume of stools improving -Treated with Unasyn okay to complete Augmentin  4)Alcohol abuse -Drinks > 4  beers daily, on most days- -drinks Fireball Cinnamon Whiskey daily  -Continue benzos per [employer] protocol as needed -Thiamine and folic acid will be provided -Recent B12 level from outpatient demonstrating to be on the low normal side; one-time  dose intramuscular injection will be provided along with daily oral supplementation.   --Patient with DTs--- confusion, disorientation and tremors persist -Scheduled Librium doses -Lorazepam per CIWA protocol  5)Hypomagnesemia/hypokalemia -Due to GI losses -Continue to replace electrolytes  6)HTN (hypertension) -Stopped losartan -BP is not at goal, increase amlodipine to 10 mg daily -IV labetalol as needed  7)Tobacco Abuse--smokes 1 p/day -Okay cessation advised, nicotine patch offered  Subjective:  Oral intake is fair -Diarrhea improving----Frequency , consistency and volume of stools improving -Complains of generalized weakness and fatigue No fever  Or chills  -Patient with DTs--- confusion, disorientation and tremors persist -I called and updated Brigitte Pulse Bondurant at [phone]--  Physical Exam: Vitals:   01/14/23 1627 01/14/23 2152 01/15/23 0410 01/15/23 0800  BP: (!) 159/73 (!) 166/81 (!) 150/76   Pulse: 78 96 88 94  Resp: 18 18 18    Temp: 97.9 F (36.6 C) 98.6 F (37 C) 98.5 F (36.9 C)   TempSrc: Oral     SpO2: 99% 99% 97%   Weight:      Height:       Physical Exam  Gen:- Awake Alert, in no acute distress  HEENT:- [place].AT, No sclera icterus Neck-Supple Neck,No JVD,.  Lungs-  CTAB , fair air movement bilaterally  CV- S1, S2 normal, RRR Abd-  +ve B.Sounds, Abd Soft, No tenderness,    Extremity/Skin:- No  edema,   good pedal pulses  Psych--Patient with DTs--- confusion, disorientation persist Neuro-generalized weakness, no new focal deficits, +ve tremors  Family Communication: --I called and updated Brigitte Pulse Bondurant at [phone]--  Disposition:--- Possible discharge home in 1 to 2 days --I called and updated Rozetta Nunnery at [phone]--  Status is: Inpatient   Planned Discharge Destination: Home  Author: Shon Hale, MD 01/15/2023 12:15 PM  For on call review [email or web address].

## 2023-01-15 NOTE — Progress Notes (Signed)
Patient actively dry heaving; vomited x1. Provided IV zofran, unable to take anything by mouth due to dry heaving. States he doesn't want to drink beer ever again.Marland Kitchen

## 2023-01-16 DIAGNOSIS — E871 Hypo-osmolality and hyponatremia: Secondary | ICD-10-CM | POA: Diagnosis not present

## 2023-01-16 LAB — RENAL FUNCTION PANEL
Albumin: 3 g/dL — ABNORMAL LOW (ref 3.5–5.0)
Anion gap: 9 (ref 5–15)
BUN: 6 mg/dL (ref 6–20)
CO2: 19 mmol/L — ABNORMAL LOW (ref 22–32)
Calcium: 8.3 mg/dL — ABNORMAL LOW (ref 8.9–10.3)
Chloride: 105 mmol/L (ref 98–111)
Creatinine, Ser: 0.75 mg/dL (ref 0.61–1.24)
GFR, Estimated: >60 mL/min (ref >60)
Glucose, Bld: 97 mg/dL (ref 70–99)
Phosphorus: 3.7 mg/dL (ref 2.5–4.6)
Potassium: 3.5 mmol/L (ref 3.5–5.1)
Sodium: 133 mmol/L — ABNORMAL LOW (ref 135–145)

## 2023-01-16 MED ORDER — POTASSIUM CHLORIDE CRYS ER 20 MEQ PO TBCR
40.0000 meq | EXTENDED_RELEASE_TABLET | Freq: Once | ORAL | Status: AC
  Administered 2023-01-16: 40 meq via ORAL
  Filled 2023-01-16: qty 2

## 2023-01-16 MED ORDER — ALUM & MAG HYDROXIDE-SIMETH 200-200-20 MG/5ML PO SUSP
30.0000 mL | ORAL | Status: DC | PRN
  Administered 2023-01-16 – 2023-01-18 (×3): 30 mL via ORAL
  Filled 2023-01-16 (×3): qty 30

## 2023-01-16 MED ORDER — POTASSIUM CHLORIDE 10 MEQ/100ML IV SOLN
10.0000 meq | INTRAVENOUS | Status: DC
Start: 2023-01-16 — End: 2023-01-16

## 2023-01-16 MED ORDER — ONDANSETRON HCL 4 MG/2ML IJ SOLN
4.0000 mg | Freq: Four times a day (QID) | INTRAMUSCULAR | Status: AC | PRN
  Administered 2023-01-16 – 2023-01-17 (×2): 4 mg via INTRAVENOUS
  Filled 2023-01-16 (×2): qty 2

## 2023-01-16 MED ORDER — PROCHLORPERAZINE 25 MG RE SUPP
25.0000 mg | Freq: Two times a day (BID) | RECTAL | Status: DC | PRN
  Administered 2023-01-16: 25 mg via RECTAL
  Filled 2023-01-16 (×2): qty 1

## 2023-01-16 NOTE — Progress Notes (Addendum)
Patient vomiting. Notified. Dr. Marisa Severin.  Patient stated "everybody thinks I'm putting on. Everybody thinks this a damn joke." Asked patient to clarify statement as we have been actively attempting to manage symptoms and to provide every comfort.   Patient flip flopping his statements. States he will call me "Z" since I have been taking care of him and thanked me.

## 2023-01-16 NOTE — Progress Notes (Signed)
Progress Note  Patient: Gabriel Koch:[id number] DOB: 14-Apr-1966 DOA: 01/11/2023     5 DOS: the patient was seen and examined on 01/16/2023   Brief hospital admission narrative: Gabriel Koch is a 57 y.o. male with medical history significant for alcohol abuse, hypertension, CVA, COPD, PTSD. Patient presented to the ED with complaints of vomiting and diarrhea with abdominal pain.  He reports symptoms started about 2 to 3 weeks ago.  Abdominal pain is diffuse.  Reports diarrhea up to 8-9 times a day, and vomiting about 2-3 times daily.  Reports generalized weakness, lightheadedness.   Saw outpatient provider, about 10 days ago, and was referred to the ED because his sodium was low- 122 but patient did not go.   ED Course: Temperature 98.2.  Heart rate 89-104.  Respirate rate 16-22.  Blood pressure systolic 1 23-1 50.  O2 sats greater than 95% on room air.   Sodium 113.  Potassium 3.  Magnesium 1.4.  Mild AST elevation at 58.  CT abdomen and pelvis suggest mild diffuse colitis. IV Unasyn started. N/s at 250cc/hr started.  Assessment and Plan: 1)HypoNatremia -Sodium was 113 on admission, baseline 129-133 over the past year.  - Likely due to GI loss and history of alcohol abuse with beer potomania and poor oral intake -Now up to 133  2)Colitis -Presented with vomiting, diarrhea, abdominal pain, multiple electrolyte abnormalities. -Recent GI panel as an outpatient negative and repeat C. difficile at time of admission negative as well -Diarrhea improved with Lomotil  -Tolerating solids -Treated with Unasyn okay to complete Augmentin 01/16/23 -Nausea and emesis persist  4)Alcohol abuse -Drinks > 4  beers daily, on most days- -drinks Fireball Cinnamon Whiskey daily  -Continue benzos per [employer] protocol as needed -Thiamine and folic acid will be provided -Recent B12 level from outpatient demonstrating to be on the low normal side; one-time dose intramuscular injection will be provided  along with daily oral supplementation.   --Patient with DTs--- confusion, disorientation and tremors persist -c/n Scheduled Librium  -Lorazepam per CIWA protocol  5)Hypomagnesemia/hypokalemia -Due to GI losses - replace electrolytes  6)HTN (hypertension) -Stopped losartan -c/n amlodipine to 10 mg daily -IV labetalol as needed  7)Tobacco Abuse--smokes 1 p/day -Okay cessation advised, nicotine patch offered  Subjective:   -Diarrhea improved----  -Patient with DTs--- confusion, disorientation and tremors persist -Nausea and Emesis persist---requiring prn anti-emetics  Physical Exam: Vitals:   01/15/23 2356 01/16/23 0326 01/16/23 0809 01/16/23 0951  BP: 139/84 (!) 142/74 134/73 (!) 150/75  Pulse: 73 83 87 93  Resp: 18 19    Temp: 98 F (36.7 C) 97.6 F (36.4 C)    TempSrc: Oral Oral    SpO2: 98% 95%    Weight:      Height:       Physical Exam  Gen:- Awake Alert, in no acute distress  HEENT:- [place].AT, No sclera icterus Neck-Supple Neck,No JVD,.  Lungs-  CTAB , fair air movement bilaterally  CV- S1, S2 normal, RRR Abd-  +ve B.Sounds, Abd Soft, epigastric discomfort,    Extremity/Skin:- No  edema,   good pedal pulses  Psych--Patient with DTs--- confusion, disorientation persist Neuro-generalized weakness, no new focal deficits, +ve tremors  Family Communication: --Previously updated Cousin Patty Bondurant at [phone]--  Disposition:--- Possible discharge home in 1 to 2 days --I called and updated Gabriel Koch at [phone]--  Status is: Inpatient   Planned Discharge Destination: Home  Author: Shon Hale, MD 01/16/2023 12:11 PM  For on call  review [email or web address].

## 2023-01-16 NOTE — Progress Notes (Signed)
Patient seen at bedside. Stated he was very nauseated this morning. Reported not sleeping. The tech had set up his room for him to sit in chair and he stated he needed to eat on the side of his bed. Had placed him closer to the bathroom, refused, stated he had to eat on the other side of the bed because he could not have people looking at him while he ate. Rearranged room for patient. Scored him via CIWA and medicated as appropriate. Phone rang in his room. Answered and handed to patient, stated he doesn't talk on that phone, he talks on the other phone. Explained someone was on the phone for him. We went back and forth a minute and then he spoke to the lady on the phone. Patient also  given imodium prn for continued loose stools, he did state he thinks they are getting better.

## 2023-01-16 NOTE — Plan of Care (Signed)

## 2023-01-17 DIAGNOSIS — E871 Hypo-osmolality and hyponatremia: Secondary | ICD-10-CM | POA: Diagnosis not present

## 2023-01-17 MED ORDER — PANCRELIPASE (LIP-PROT-AMYL) 12000-38000 UNITS PO CPEP
24000.0000 [IU] | ORAL_CAPSULE | Freq: Three times a day (TID) | ORAL | Status: DC
  Administered 2023-01-18 (×2): 24000 [IU] via ORAL
  Filled 2023-01-17 (×2): qty 2

## 2023-01-17 MED ORDER — CHLORDIAZEPOXIDE HCL 5 MG PO CAPS
10.0000 mg | ORAL_CAPSULE | Freq: Three times a day (TID) | ORAL | Status: DC
  Administered 2023-01-17 – 2023-01-18 (×2): 10 mg via ORAL
  Filled 2023-01-17 (×2): qty 2

## 2023-01-17 MED ORDER — DIPHENOXYLATE-ATROPINE 2.5-0.025 MG PO TABS
1.0000 | ORAL_TABLET | Freq: Four times a day (QID) | ORAL | Status: DC
  Administered 2023-01-17 – 2023-01-18 (×3): 1 via ORAL
  Filled 2023-01-17 (×3): qty 1

## 2023-01-17 NOTE — Progress Notes (Signed)
Two emesis episodes this shift, both moderate amount.

## 2023-01-17 NOTE — Plan of Care (Signed)

## 2023-01-17 NOTE — Progress Notes (Signed)
Progress Note  Patient: Gabriel Koch BJY:[id number] DOB: Sep 19, 1965 DOA: 01/11/2023     6 DOS: the patient was seen and examined on 01/17/2023   Brief hospital admission narrative: KEEGEN FURNER is a 57 y.o. male with medical history significant for alcohol abuse, hypertension, CVA, COPD, PTSD. Patient presented to the ED with complaints of vomiting and diarrhea with abdominal pain.  He reports symptoms started about 2 to 3 weeks ago.  Abdominal pain is diffuse.  Reports diarrhea up to 8-9 times a day, and vomiting about 2-3 times daily.  Reports generalized weakness, lightheadedness.   Saw outpatient provider, about 10 days ago, and was referred to the ED because his sodium was low- 122 but patient did not go.   ED Course: Temperature 98.2.  Heart rate 89-104.  Respirate rate 16-22.  Blood pressure systolic 1 23-1 50.  O2 sats greater than 95% on room air.   Sodium 113.  Potassium 3.  Magnesium 1.4.  Mild AST elevation at 58.  CT abdomen and pelvis suggest mild diffuse colitis. IV Unasyn started. N/s at 250cc/hr started.  Assessment and Plan: 1)HypoNatremia -Sodium was 113 on admission, baseline 129-133 over the past year.  - Likely due to GI loss and history of alcohol abuse with beer potomania and poor oral intake -Now up to 133  2)Colitis -Presented with vomiting, diarrhea, abdominal pain, multiple electrolyte abnormalities. -Recent GI panel as an outpatient negative and repeat C. difficile at time of admission negative as well -Tolerating solids -Treated with Unasyn okay to complete Augmentin last dose 01/18/2023 01/17/23 -Nausea and emesis , as well as diarrhea persist -Get KUB in a.m. -Check lipase -  4)Alcohol abuse -Drinks > 4  beers daily, on most days- -drinks Fireball Cinnamon Whiskey daily  -Continue benzos per [employer] protocol as needed -Thiamine and folic acid will be provided -Recent B12 level from outpatient demonstrating to be on the low normal side; one-time  dose intramuscular injection will be provided along with daily oral supplementation.   --Patient with DTs--- confusion, disorientation and tremors are improving -c/n Scheduled Librium  -Lorazepam per CIWA protocol  5)Hypomagnesemia/hypokalemia -Due to GI losses - replace electrolytes  6)HTN (hypertension) -Stopped losartan -c/n amlodipine to 10 mg daily -IV labetalol as needed  7)Tobacco Abuse--smokes 1 p/day -- cessation advised, nicotine patch offered  Subjective:   -Emesis persist--no blood or bile -Diarrhea improving -DT symptoms including confusion and tremors appear to be improving  Physical Exam: Vitals:   01/16/23 1828 01/16/23 2014 01/17/23 0342 01/17/23 1430  BP: (!) 159/79 (!) 145/78 (!) 151/83 (!) 144/81  Pulse: 92 82 70 78  Resp:  18 19 17   Temp:  99.3 F (37.4 C) 98.7 F (37.1 C) 98.9 F (37.2 C)  TempSrc:  Oral Oral Oral  SpO2:  95% 95% 96%  Weight:      Height:       Physical Exam  Gen:- Awake Alert, in no acute distress  HEENT:- [place].AT, No sclera icterus Neck-Supple Neck,No JVD,.  Lungs-  CTAB , fair air movement bilaterally  CV- S1, S2 normal, RRR Abd-  +ve B.Sounds, Abd Soft, epigastric discomfort,    Extremity/Skin:- No  edema,   good pedal pulses  Psych--Patient with DTs--- confusion, disorientation persist Neuro-generalized weakness, no new focal deficits, +ve tremors  Family Communication: --Previously updated Cousin Patty Bondurant at [phone]--  Disposition:--- Possible discharge home in 1 to 2 days - updated Rozetta Nunnery at [phone]--  Status is: Inpatient   Planned Discharge  Destination: Home  Author: Shon Hale, MD 01/17/2023 7:19 PM  For on call review [email or web address].

## 2023-01-18 ENCOUNTER — Inpatient Hospital Stay (HOSPITAL_COMMUNITY): Payer: MEDICAID

## 2023-01-18 DIAGNOSIS — E871 Hypo-osmolality and hyponatremia: Secondary | ICD-10-CM | POA: Diagnosis not present

## 2023-01-18 LAB — BASIC METABOLIC PANEL
Anion gap: 8 (ref 5–15)
BUN: 6 mg/dL (ref 6–20)
CO2: 23 mmol/L (ref 22–32)
Calcium: 8.6 mg/dL — ABNORMAL LOW (ref 8.9–10.3)
Chloride: 103 mmol/L (ref 98–111)
Creatinine, Ser: 0.71 mg/dL (ref 0.61–1.24)
GFR, Estimated: >60 mL/min (ref >60)
Glucose, Bld: 95 mg/dL (ref 70–99)
Potassium: 3.1 mmol/L — ABNORMAL LOW (ref 3.5–5.1)
Sodium: 134 mmol/L — ABNORMAL LOW (ref 135–145)

## 2023-01-18 LAB — LIPASE, BLOOD: Lipase: 45 U/L (ref 11–51)

## 2023-01-18 MED ORDER — ALBUTEROL SULFATE HFA 108 (90 BASE) MCG/ACT IN AERS: 1.0000 | INHALATION_SPRAY | RESPIRATORY_TRACT | 1 refills | Status: DC | PRN

## 2023-01-18 MED ORDER — FOLIC ACID 1 MG PO TABS: 1.0000 mg | ORAL_TABLET | Freq: Every day | ORAL | 3 refills | Status: DC

## 2023-01-18 MED ORDER — POTASSIUM CHLORIDE CRYS ER 20 MEQ PO TBCR
40.0000 meq | EXTENDED_RELEASE_TABLET | ORAL | Status: AC
  Administered 2023-01-18 (×2): 40 meq via ORAL
  Filled 2023-01-18 (×2): qty 2

## 2023-01-18 MED ORDER — TRAZODONE HCL 100 MG PO TABS: 100.0000 mg | ORAL_TABLET | Freq: Every day | ORAL | 2 refills | Status: DC

## 2023-01-18 MED ORDER — LORAZEPAM 2 MG/ML IJ SOLN: 1.0000 mg | INTRAMUSCULAR | Status: DC | PRN

## 2023-01-18 MED ORDER — POTASSIUM CHLORIDE ER 20 MEQ PO TBCR: 20.0000 meq | EXTENDED_RELEASE_TABLET | Freq: Every day | ORAL | 0 refills | Status: DC

## 2023-01-18 MED ORDER — BUSPIRONE HCL 10 MG PO TABS: 10.0000 mg | ORAL_TABLET | Freq: Two times a day (BID) | ORAL | 3 refills | Status: DC

## 2023-01-18 MED ORDER — NICOTINE 21 MG/24HR TD PT24: 21.0000 mg | MEDICATED_PATCH | Freq: Every day | TRANSDERMAL | 0 refills | Status: DC

## 2023-01-18 MED ORDER — PROCHLORPERAZINE 25 MG RE SUPP
25.0000 mg | Freq: Once | RECTAL | Status: DC
  Filled 2023-01-18: qty 1

## 2023-01-18 MED ORDER — TIOTROPIUM BROMIDE MONOHYDRATE 18 MCG IN CAPS: 18.0000 ug | ORAL_CAPSULE | Freq: Every day | RESPIRATORY_TRACT | 12 refills | Status: DC

## 2023-01-18 MED ORDER — CHLORDIAZEPOXIDE HCL 10 MG PO CAPS: 10.0000 mg | ORAL_CAPSULE | ORAL | 0 refills | Status: DC

## 2023-01-18 MED ORDER — HYDROCORTISONE (PERIANAL) 2.5 % EX CREA
1.0000 | TOPICAL_CREAM | Freq: Two times a day (BID) | CUTANEOUS | 0 refills | Status: AC
Start: 2023-01-18 — End: 2023-02-01

## 2023-01-18 MED ORDER — LORAZEPAM 1 MG PO TABS: 1.0000 mg | ORAL_TABLET | ORAL | Status: DC | PRN

## 2023-01-18 MED ORDER — LOSARTAN POTASSIUM 50 MG PO TABS: 50.0000 mg | ORAL_TABLET | Freq: Every day | ORAL | 3 refills | Status: DC

## 2023-01-18 MED ORDER — ATORVASTATIN CALCIUM 40 MG PO TABS: 40.0000 mg | ORAL_TABLET | Freq: Every day | ORAL | 2 refills | Status: DC

## 2023-01-18 MED ORDER — ADULT MULTIVITAMIN W/MINERALS CH: 1.0000 | ORAL_TABLET | Freq: Every day | ORAL | 1 refills | Status: DC

## 2023-01-18 MED ORDER — ESOMEPRAZOLE MAGNESIUM 40 MG PO CPDR: 40.0000 mg | DELAYED_RELEASE_CAPSULE | Freq: Two times a day (BID) | ORAL | 3 refills | Status: DC

## 2023-01-18 MED ORDER — BUPROPION HCL 75 MG PO TABS: 150.0000 mg | ORAL_TABLET | Freq: Two times a day (BID) | ORAL | 3 refills | Status: DC

## 2023-01-18 MED ORDER — ONDANSETRON 4 MG PO TBDP: 4.0000 mg | ORAL_TABLET | Freq: Three times a day (TID) | ORAL | 0 refills | Status: DC | PRN

## 2023-01-18 MED ORDER — CYANOCOBALAMIN 1000 MCG PO TABS: 1000.0000 ug | ORAL_TABLET | Freq: Every day | ORAL | 1 refills | Status: DC

## 2023-01-18 MED ORDER — VITAMIN B-1 100 MG PO TABS: 100.0000 mg | ORAL_TABLET | Freq: Every day | ORAL | 3 refills | Status: DC

## 2023-01-18 MED ORDER — LOPERAMIDE HCL 2 MG PO CAPS: 2.0000 mg | ORAL_CAPSULE | Freq: Four times a day (QID) | ORAL | 0 refills | Status: DC | PRN

## 2023-01-18 NOTE — Discharge Summary (Signed)
Gabriel Koch, is a 57 y.o. male  DOB [birth year]  MRN [id number].  Admission date:  01/11/2023  Admitting Physician  Onnie Boer, MD  Discharge Date:  01/18/2023   Primary MD  Elfredia Nevins, MD  Recommendations for primary care physician for things to follow:   1)Take Librium (CHLORDIAZEPOXIDE HCL CAPS) Take 1 Capsule 3 times a day for 2 days, then take 1 cap 2 times a day for 2 days, then 1 cap daily x 2 days , then STOP----this will Help You Quit Drinking Alcohol.... - Please do Not drink alcohol while taking this medication 2) complete abstinence from alcohol advised-----consider outpatient or inpatient alcohol rehab program 3)Avoid ibuprofen/Advil/Aleve/Motrin/Goody Powders/Naproxen/BC powders/Meloxicam/Diclofenac/Indomethacin and other Nonsteroidal anti-inflammatory medications as these will make you more likely to bleed and can cause stomach ulcers, can also cause Kidney problems. 4) repeat CBC and repeat CMP blood test within a week advised  Admission Diagnosis  Colitis [K52.9] Hyponatremia [E87.1]  Discharge Diagnosis  Colitis [K52.9] Hyponatremia [E87.1]    Principal Problem:   Hyponatremia Active Problems:   Colitis   Alcohol abuse   HTN (hypertension)   Hypokalemia   Hypomagnesemia      Past Medical History:  Diagnosis Date   Alcohol abuse    Anxiety and depression    Blood transfusion    Complication of anesthesia    patient hemorrrhaged about 2 weeks after tonsillecotmy   COPD (chronic obstructive pulmonary disease) (HCC)    GERD (gastroesophageal reflux disease)    Hypertension    PTSD (post-traumatic stress disorder)    Stroke (HCC) 2021    Past Surgical History:  Procedure Laterality Date   APPENDECTOMY     BIOPSY  04/02/2020   Procedure: BIOPSY;  Surgeon: Lanelle Bal, DO;  [facility];  Service: Endoscopy;;   BIOPSY  03/18/2022    Procedure: BIOPSY;  Surgeon: Lanelle Bal, DO;  [facility];  Service: Endoscopy;;   COLONOSCOPY WITH PROPOFOL N/A 04/02/2020   Procedure: COLONOSCOPY WITH PROPOFOL;  Surgeon: Lanelle Bal, DO;  [facility];  Service: Endoscopy;  Laterality: N/A;  8:30am   COLONOSCOPY WITH PROPOFOL N/A 03/18/2022   Procedure: COLONOSCOPY WITH PROPOFOL;  Surgeon: Lanelle Bal, DO;  [facility];  Service: Endoscopy;  Laterality: N/A;  1:15 pm, pt knows to arrive at 10:45 can't come earlier due to transportation   ESOPHAGOGASTRODUODENOSCOPY (EGD) WITH PROPOFOL N/A 04/02/2020   Procedure: ESOPHAGOGASTRODUODENOSCOPY (EGD) WITH PROPOFOL;  Surgeon: Lanelle Bal, DO;  [facility];  Service: Endoscopy;  Laterality: N/A;   ESOPHAGOGASTRODUODENOSCOPY (EGD) WITH PROPOFOL N/A 03/21/2021   Procedure: ESOPHAGOGASTRODUODENOSCOPY (EGD) WITH PROPOFOL;  Surgeon: Lanelle Bal, DO;  [facility];  Service: Endoscopy;  Laterality: N/A;  11:00am   ESOPHAGOGASTRODUODENOSCOPY (EGD) WITH PROPOFOL N/A 03/18/2022   Procedure: ESOPHAGOGASTRODUODENOSCOPY (EGD) WITH PROPOFOL;  Surgeon: Lanelle Bal, DO;  [facility];  Service: Endoscopy;  Laterality: N/A;   POLYPECTOMY  04/02/2020   Procedure:  POLYPECTOMY;  Surgeon: Lanelle Bal, DO;  [facility];  Service: Endoscopy;;   POLYPECTOMY  03/21/2021   Procedure: POLYPECTOMY;  Surgeon: Lanelle Bal, DO;  [facility];  Service: Endoscopy;;  duodenal   POLYPECTOMY  03/18/2022   Procedure: POLYPECTOMY;  Surgeon: Lanelle Bal, DO;  [facility];  Service: Endoscopy;;   REPAIR EXTENSOR TENDON Left 08/06/2022   Procedure: LEFT LONG FINGER EXTENSOR TENDON CENTRALIZATION;  Surgeon: Betha Loa, MD;  Location: [facility];  Service: Orthopedics;  Laterality: Left;  60 MIN   STERIOD INJECTION Left 08/06/2022   Procedure: LEFT CARPAL TUNNEL INJECTION;   Surgeon: Betha Loa, MD;  Location: [facility];  Service: Orthopedics;  Laterality: Left;   TONSILLECTOMY      HPI  from the history and physical done on the day of admission:    Patient coming from: Home   I have personally briefly reviewed patient's old medical records in [facility]   Chief Complaint: Vomiting, diarrhea   HPI: Gabriel Koch is a 57 y.o. male with medical history significant for alcohol abuse, hypertension, CVA, COPD, PTSD. Patient presented to the ED with complaints of vomiting and diarrhea with abdominal pain.  He reports symptoms started about 2 to 3 weeks ago.  Abdominal pain is diffuse.  Reports diarrhea up to 8-9 times a day, and vomiting about 2-3 times daily.  Reports generalized weakness, lightheadedness.   Saw outpatient provider, about 10 days ago, and was referred to the ED because his sodium was low- 122 but patient did not go.   ED Course: Temperature 98.2.  Heart rate 89-104.  Respirate rate 16-22.  Blood pressure systolic 1 23-1 50.  O2 sats greater than 95% on room air.   Sodium 113.  Potassium 3.  Magnesium 1.4.  Mild AST elevation at 58.  CT abdomen and pelvis suggest mild diffuse colitis. IV Unasyn started. N/s at 250cc/hr started.   Review of Systems: As per HPI all other systems reviewed and negative.   Hospital Course:    Assessment and Plan: 1)HypoNatremia -Sodium was 113 on admission, baseline 129-133 over the past year.  - Likely due to GI loss and history of alcohol abuse with beer potomania and poor oral intake -Now up to 134 -BMP within a week and avoid dehydration and alcohol intake   2)Colitis -Presented with vomiting, diarrhea, abdominal pain, multiple electrolyte abnormalities. -Recent GI panel as an outpatient negative and repeat C. difficile at time of admission negative as well -Tolerating solids -Treated with Unasyn initially, then completed Augmentin last dose 01/18/2023 --Nausea and diarrhea  improved significantly - KUB including abdominal and chest films without acute findings - lipase is not elevated -   4)Alcohol abuse -Drinks > 4  beers daily, on most days- -drinks Fireball Cinnamon Whiskey daily  -Continue benzos per [employer] protocol as needed -Thiamine and folic acid will be provided -Recent B12 level from outpatient demonstrating to be on the low normal side; - -received IM B12 shot okay to discharge on oral B12 tablets - DTs and tremors have resolved -Discharged on Librium taper   5)Hypomagnesemia/hypokalemia -Due to GI losses -Replaced  -Repeat BMP within a week   6)HTN (hypertension) -Stable, restart losartan   7)Tobacco Abuse--smokes 1 p/day -- cessation advised, nicotine patch offered  Discharge Condition: stable  Follow UP   Follow-up Information     Elfredia Nevins, MD. Schedule an appointment as soon as possible for a  visit in 1 week(s).   Specialty: Internal Medicine Why: Repeat CBC and CMP Contact information: [address] [place] [address] [phone]                Diet and Activity recommendation:  As advised  Discharge Instructions    Discharge Instructions     Ambulatory Referral for Lung Cancer Scre   Complete by: As directed    Call MD for:  difficulty breathing, headache or visual disturbances   Complete by: As directed    Call MD for:  persistant dizziness or light-headedness   Complete by: As directed    Call MD for:  persistant nausea and vomiting   Complete by: As directed    Call MD for:  temperature >100.4   Complete by: As directed    Diet - low sodium heart healthy   Complete by: As directed    Discharge instructions   Complete by: As directed    1)Take Librium (CHLORDIAZEPOXIDE HCL CAPS) Take 1 Capsule 3 times a day for 2 days, then take 1 cap 2 times a day for 2 days, then 1 cap daily x 2 days , then STOP----this will Help You Quit Drinking Alcohol.... - Please do Not drink alcohol while  taking this medication 2) complete abstinence from alcohol advised-----consider outpatient or inpatient alcohol rehab program 3)Avoid ibuprofen/Advil/Aleve/Motrin/Goody Powders/Naproxen/BC powders/Meloxicam/Diclofenac/Indomethacin and other Nonsteroidal anti-inflammatory medications as these will make you more likely to bleed and can cause stomach ulcers, can also cause Kidney problems. 4) repeat CBC and repeat CMP blood test within a week advised   Increase activity slowly   Complete by: As directed          Discharge Medications     Allergies as of 01/18/2023       Reactions   Bee Venom Anaphylaxis        Medication List     STOP taking these medications    famotidine 20 MG tablet Commonly known as: Pepcid   zolpidem 10 MG tablet Commonly known as: AMBIEN       TAKE these medications    albuterol 108 (90 Base) MCG/ACT inhaler Commonly known as: VENTOLIN HFA Inhale 1 puff into the lungs every 4 (four) hours as needed for wheezing or shortness of breath.   atorvastatin 40 MG tablet Commonly known as: LIPITOR Take 1 tablet (40 mg total) by mouth daily.   buPROPion 75 MG tablet Commonly known as: WELLBUTRIN Take 2 tablets (150 mg total) by mouth 2 (two) times daily.   busPIRone 10 MG tablet Commonly known as: BUSPAR Take 1 tablet (10 mg total) by mouth 2 (two) times daily.   celecoxib 200 MG capsule Commonly known as: CELEBREX Take 200 mg by mouth daily.   chlordiazePOXIDE 10 MG capsule Commonly known as: LIBRIUM Take 1 capsule (10 mg total) by mouth See admin instructions. Take Librium (CHLORDIAZEPOXIDE HCL CAPS) Take 1 Capsule 3 times a day for 2 days, then take 1 cap 2 times a day for 2 days, then 1 cap daily x 2 days , then STOP----this will Help You Quit Drinking Alcohol.... - Please do Not drink alcohol while taking this medication   cyanocobalamin 1000 MCG tablet Take 1 tablet (1,000 mcg total) by mouth daily. Start taking on: January 19, 2023    diclofenac Sodium 1 % Gel Commonly known as: VOLTAREN Apply 2 g topically 4 (four) times daily.   esomeprazole 40 MG capsule Commonly known as: NEXIUM Take 1 capsule (40 mg  total) by mouth 2 (two) times daily before a meal.   folic acid 1 MG tablet Commonly known as: FOLVITE Take 1 tablet (1 mg total) by mouth daily. What changed: See the new instructions.   hydrocortisone 2.5 % rectal cream Commonly known as: ANUSOL-HC Place 1 Application rectally 2 (two) times daily for 14 days.   loperamide 2 MG capsule Commonly known as: IMODIUM Take 1 capsule (2 mg total) by mouth every 6 (six) hours as needed for diarrhea or loose stools.   losartan 50 MG tablet Commonly known as: COZAAR Take 1 tablet (50 mg total) by mouth daily.   multivitamin with minerals Tabs tablet Take 1 tablet by mouth daily. Start taking on: January 19, 2023   nicotine 21 mg/24hr patch Commonly known as: NICODERM CQ - dosed in mg/24 hours Place 1 patch (21 mg total) onto the skin daily. Start taking on: January 19, 2023   ondansetron 4 MG disintegrating tablet Commonly known as: ZOFRAN-ODT Take 1 tablet (4 mg total) by mouth every 8 (eight) hours as needed for nausea or vomiting.   Potassium Chloride ER 20 MEQ Tbcr Take 1 tablet (20 mEq total) by mouth daily for 5 days. 1 tab daily by mouth   thiamine 100 MG tablet Commonly known as: Vitamin B-1 Take 1 tablet (100 mg total) by mouth daily. Start taking on: January 19, 2023   tiotropium 18 MCG inhalation capsule Commonly known as: SPIRIVA Place 1 capsule (18 mcg total) into inhaler and inhale daily.   traZODone 100 MG tablet Commonly known as: DESYREL Take 1 tablet (100 mg total) by mouth at bedtime.        Major procedures and Radiology Reports - PLEASE review detailed and final reports for all details, in brief -   DG ABD ACUTE 2+V W 1V CHEST  Result Date: 01/18/2023 CLINICAL DATA:  Vomiting.  Left upper quadrant pain. EXAM: DG ABDOMEN ACUTE WITH  1 VIEW CHEST COMPARISON:  Abdomen and pelvis CT 01/11/2023 FINDINGS: Old left-sided rib fractures noted. The lungs are clear without focal pneumonia, edema, pneumothorax or pleural effusion. The cardiopericardial silhouette is within normal limits for size. Upright film shows no evidence for intraperitoneal free air. No gaseous bowel dilatation to suggest small bowel obstruction. Air is seen scattered along the length of a nondilated colon visualized bony anatomy unremarkable. IMPRESSION: 1. No acute cardiopulmonary findings. 2. Nonobstructive bowel gas pattern. Electronically Signed   By: Kennith Center M.D.   On: 01/18/2023 08:11   CT ABDOMEN PELVIS W CONTRAST  Result Date: 01/11/2023 CLINICAL DATA:  Left upper quadrant abdominal pain. EXAM: CT ABDOMEN AND PELVIS WITH CONTRAST TECHNIQUE: Multidetector CT imaging of the abdomen and pelvis was performed using the standard protocol following bolus administration of intravenous contrast. RADIATION DOSE REDUCTION: This exam was performed according to the departmental dose-optimization program which includes automated exposure control, adjustment of the mA and/or kV according to patient size and/or use of iterative reconstruction technique. CONTRAST:  OMNIPAQUE IOHEXOL 300 MG/ML  SOLN COMPARISON:  CT abdomen pelvis dated 02/09/2022. FINDINGS: Lower chest: The visualized lung bases are clear. There is coronary vascular calcification. No intra-abdominal free air.  Trace free fluid within the pelvis. Hepatobiliary: Faint subcentimeter hypodense lesion in the left lobe of the liver stool small to characterize. The liver is otherwise unremarkable. No biliary dilatation. The gallbladder is unremarkable. Pancreas: Unremarkable. No pancreatic ductal dilatation or surrounding inflammatory changes. Spleen: Normal in size without focal abnormality. Adrenals/Urinary Tract: The adrenal glands unremarkable.  There is no hydronephrosis on either side. There is symmetric  enhancement and excretion of contrast by both kidneys. The visualized ureters and urinary bladder appear unremarkable. Stomach/Bowel: There is mild diffuse thickened appearance of the colon concerning for colitis. Clinical correlation is recommended. Loose stool within the colon consistent with diarrheal state. There is no bowel obstruction. The appendix is not visualized with certainty. No inflammatory changes identified in the right lower quadrant. Vascular/Lymphatic: Moderate aortoiliac atherosclerotic disease. The IVC is unremarkable. No portal venous gas. There is no adenopathy. Reproductive: The prostate and seminal vesicles are grossly unremarkable. No pelvic mass. Other: None Musculoskeletal: Osteopenia.  No acute osseous pathology. IMPRESSION: 1. Findings concerning for colitis. No bowel obstruction. 2.  Aortic Atherosclerosis (ICD10-I70.0). Electronically Signed   By: Elgie Collard M.D.   On: 01/11/2023 16:31    Micro Results   Recent Results (from the past 240 hour(s))  C Difficile Quick Screen w PCR reflex     Status: None   Collection Time: 01/11/23  6:30 PM   Specimen: STOOL  Result Value Ref Range Status   C Diff antigen NEGATIVE NEGATIVE Final   C Diff toxin NEGATIVE NEGATIVE Final   C Diff interpretation No C. difficile detected.  Final    Comment: Performed at [facility], [address] [place], [place] [address]  Gastrointestinal Panel by PCR , Stool     Status: None   Collection Time: 01/11/23  8:08 PM   Specimen: Stool  Result Value Ref Range Status   Campylobacter species NOT DETECTED NOT DETECTED Final   Plesimonas shigelloides NOT DETECTED NOT DETECTED Final   Salmonella species NOT DETECTED NOT DETECTED Final   Yersinia enterocolitica NOT DETECTED NOT DETECTED Final   Vibrio species NOT DETECTED NOT DETECTED Final   Vibrio cholerae NOT DETECTED NOT DETECTED Final   Enteroaggregative E coli (EAEC) NOT DETECTED NOT DETECTED Final   Enteropathogenic E coli (EPEC)  NOT DETECTED NOT DETECTED Final   Enterotoxigenic E coli (ETEC) NOT DETECTED NOT DETECTED Final   Shiga like toxin producing E coli (STEC) NOT DETECTED NOT DETECTED Final   Shigella/Enteroinvasive E coli (EIEC) NOT DETECTED NOT DETECTED Final   Cryptosporidium NOT DETECTED NOT DETECTED Final   Cyclospora cayetanensis NOT DETECTED NOT DETECTED Final   Entamoeba histolytica NOT DETECTED NOT DETECTED Final   Giardia lamblia NOT DETECTED NOT DETECTED Final   Adenovirus F40/41 NOT DETECTED NOT DETECTED Final   Astrovirus NOT DETECTED NOT DETECTED Final   Norovirus GI/GII NOT DETECTED NOT DETECTED Final   Rotavirus A NOT DETECTED NOT DETECTED Final   Sapovirus (I, II, IV, and V) NOT DETECTED NOT DETECTED Final    Comment: Performed at [facility], [address] [place], [place] [address]  MRSA Next Gen by PCR, Nasal     Status: None   Collection Time: 01/11/23  8:42 PM   Specimen: Nasal Mucosa; Nasal Swab  Result Value Ref Range Status   MRSA by PCR Next Gen NOT DETECTED NOT DETECTED Final    Comment: (NOTE) The GeneXpert MRSA Assay (FDA approved for NASAL specimens only), is one component of a comprehensive MRSA colonization surveillance program. It is not intended to diagnose MRSA infection nor to guide or monitor treatment for MRSA infections. Test performance is not FDA approved in patients less than 86 years old. Performed at [facility], [address], [place], [place] [address]     Today   Subjective    Gabriel Koch today has no new  complaints  -Tolerating oral intake well -Nausea and diarrhea improving          Patient has been seen and examined prior to discharge   Objective   Blood pressure 128/68, pulse 73, temperature 98.8 F (37.1 C), resp. rate 18, height 5\' 9"  (1.753 m), weight 66.9 kg, SpO2 93%.   Intake/Output Summary (Last 24 hours) at 01/18/2023 1013 Last data filed at 01/18/2023 0900 Gross per 24 hour  Intake 720 ml  Output --  Net 720  ml   Exam Gen:- Awake Alert, no acute distress  HEENT:- [place].AT, No sclera icterus Neck-Supple Neck,No JVD,.  Lungs-  CTAB , good air movement bilaterally CV- S1, S2 normal, regular Abd-  +ve B.Sounds, Abd Soft, No tenderness,    Extremity/Skin:- No  edema,   good pulses Psych-affect is appropriate, oriented x3 Neuro-no new focal deficits, no significant tremors    Data Review   CBC w Diff:  Lab Results  Component Value Date   WBC 3.7 (L) 01/13/2023   HGB 11.3 (L) 01/13/2023   HGB 14.5 03/09/2013   HCT 33.1 (L) 01/13/2023   HCT 39.6 (L) 03/09/2013   PLT 150 01/13/2023   PLT 264 03/09/2013   LYMPHOPCT 15 12/14/2021   LYMPHOPCT 32.0 08/24/2012   MONOPCT 15 12/14/2021   MONOPCT 12.0 08/24/2012   EOSPCT 2 12/14/2021   EOSPCT 14.4 08/24/2012   BASOPCT 1 12/14/2021   BASOPCT 1.0 08/24/2012    CMP:  Lab Results  Component Value Date   NA 134 (L) 01/18/2023   NA 122 (L) 01/01/2023   NA 132 (L) 03/09/2013   K 3.1 (L) 01/18/2023   K 2.7 (L) 03/09/2013   CL 103 01/18/2023   CL 97 (L) 03/09/2013   CO2 23 01/18/2023   CO2 26 03/09/2013   BUN 6 01/18/2023   BUN 7 01/01/2023   BUN 8 03/09/2013   CREATININE 0.71 01/18/2023   CREATININE 0.86 03/09/2013   PROT 7.3 01/11/2023   PROT 7.5 01/01/2023   PROT 7.2 03/09/2013   ALBUMIN 3.0 (L) 01/16/2023   ALBUMIN 4.6 01/01/2023   ALBUMIN 4.0 03/09/2013   BILITOT 0.6 01/11/2023   BILITOT 0.4 01/01/2023   BILITOT 0.4 03/09/2013   ALKPHOS 77 01/11/2023   ALKPHOS 87 03/09/2013   AST 58 (H) 01/11/2023   AST 33 03/09/2013   ALT 37 01/11/2023   ALT 31 03/09/2013  . Total Discharge time is about 33 minutes  Shon Hale M.D on 01/18/2023 at 10:13 AM  Go to [email or web address] -  for contact info  [facility] - Office  [phone]

## 2023-01-18 NOTE — Discharge Instructions (Signed)
1)Take Librium (CHLORDIAZEPOXIDE HCL CAPS) Take 1 Capsule 3 times a day for 2 days, then take 1 cap 2 times a day for 2 days, then 1 cap daily x 2 days , then STOP----this will Help You Quit Drinking Alcohol.... - Please do Not drink alcohol while taking this medication 2) complete abstinence from alcohol advised-----consider outpatient or inpatient alcohol rehab program 3)Avoid ibuprofen/Advil/Aleve/Motrin/Goody Powders/Naproxen/BC powders/Meloxicam/Diclofenac/Indomethacin and other Nonsteroidal anti-inflammatory medications as these will make you more likely to bleed and can cause stomach ulcers, can also cause Kidney problems. 4) repeat CBC and repeat CMP blood test within a week advised

## 2023-01-18 NOTE — Progress Notes (Signed)
Patient declined SA resources/education again.     Gabriel Koch, Juleen China, LCSW

## 2023-01-22 ENCOUNTER — Ambulatory Visit (HOSPITAL_COMMUNITY): Payer: MEDICAID

## 2023-01-27 NOTE — Progress Notes (Unsigned)
Referring Provider: Elfredia Nevins, MD Primary Care Physician:  Elfredia Nevins, MD Primary GI Physician: Dr. Marletta Lor  No chief complaint on file.   HPI:   Gabriel Koch is a 57 y.o. male with history of GERD, Barrett's esophagus, duodenal tubular adenoma, adenomatous colon polyps, presenting today for hospital follow-up.   Last seen in the office 12/24/22 reporting several years of morning vomiting, often post tussive, left rib pain, diarrhea with multiple Bms daily and nightly, intermittent rectal bleeding over the last month, previously resolved after hemorrhoid bandings. Reported hot flashes with Bms. Also with weight loss, only eating 1 meal a day due to abdominal bloating. GERD well controlled on Nexium BID. Talking a lot of tylenol. Drinking couple beer a day. Not sure if he was taking meloxicam. No other NSAIDs. Planned to check labs/stool studies, arrange CT, chest x-ray, start Pepcid at bedtime, continue Nexium, Anusol rectal, avoid alcohol and NSAIDs.   Stool studies negative.  LFTs elevated, sodium low at 122, iron wnl, B12 low at 215, Hgb low at 12. TSH wnl. Patient referred to the ER due to hyponatremia.   He presented to the emergency room and was ultimately admitted on 01/11/2023.  His sodium was 113, K 3, Mag 1.4.  He had mild AST elevation of 58.  CT A/P with contrast with mild diffuse thickened appearance of the colon concerning for colitis. C diff and GI path panel negative. Hyponatremia improved with IV fluids. Colitis was initially treated with Unasyn, but transitioned to Augmentin completing a 7-day course during his hospitalization with last dose on 7/15, day of discharge.  Nausea and diarrhea significantly improved.  He did have alcohol withdrawal/DTs during admission.  He was discharged on a Librium taper.   Today:       EGD 03/18/2022 3 cm hiatal hernia, short segment Barrett's biopsied, gastritis biopsied, duodenal scar/?  Residual polyp tissue that was  resected.  Pathology with mild nonspecific reactive gastropathy, negative for H. pylori, duodenal adenoma with low-grade dysplasia, esophageal biopsy consistent with Barrett's without dysplasia. Should have repeat EGD in 5 years.    Colonoscopy 03/18/2022: Nonbleeding internal hemorrhoids, multiple diverticula in the sigmoid colon, segmental area of moderately friable mucosa with spontaneous bleeding in the transverse and ascending colon biopsied.  Pathology with no specific histopathologic change. Recommended 5 year surveillance.   Hemorrhoid banding x 3 in 2023.    Past Medical History:  Diagnosis Date   Alcohol abuse    Anxiety and depression    Blood transfusion    Complication of anesthesia    patient hemorrrhaged about 2 weeks after tonsillecotmy   COPD (chronic obstructive pulmonary disease) (HCC)    GERD (gastroesophageal reflux disease)    Hypertension    PTSD (post-traumatic stress disorder)    Stroke (HCC) 2021    Past Surgical History:  Procedure Laterality Date   APPENDECTOMY     BIOPSY  04/02/2020   Procedure: BIOPSY;  Surgeon: Lanelle Bal, DO;  [facility];  Service: Endoscopy;;   BIOPSY  03/18/2022   Procedure: BIOPSY;  Surgeon: Lanelle Bal, DO;  [facility];  Service: Endoscopy;;   COLONOSCOPY WITH PROPOFOL N/A 04/02/2020   Procedure: COLONOSCOPY WITH PROPOFOL;  Surgeon: Lanelle Bal, DO;  [facility];  Service: Endoscopy;  Laterality: N/A;  8:30am   COLONOSCOPY WITH PROPOFOL N/A 03/18/2022   Procedure: COLONOSCOPY WITH PROPOFOL;  Surgeon: Lanelle Bal, DO;  [facility];  Service: Endoscopy;  Laterality: N/A;  1:15 pm, pt knows to arrive at 10:45 can't come earlier due to transportation   ESOPHAGOGASTRODUODENOSCOPY (EGD) WITH PROPOFOL N/A 04/02/2020   Procedure: ESOPHAGOGASTRODUODENOSCOPY (EGD) WITH PROPOFOL;  Surgeon: Lanelle Bal, DO;  [facility];  Service: Endoscopy;  Laterality:  N/A;   ESOPHAGOGASTRODUODENOSCOPY (EGD) WITH PROPOFOL N/A 03/21/2021   Procedure: ESOPHAGOGASTRODUODENOSCOPY (EGD) WITH PROPOFOL;  Surgeon: Lanelle Bal, DO;  [facility];  Service: Endoscopy;  Laterality: N/A;  11:00am   ESOPHAGOGASTRODUODENOSCOPY (EGD) WITH PROPOFOL N/A 03/18/2022   Procedure: ESOPHAGOGASTRODUODENOSCOPY (EGD) WITH PROPOFOL;  Surgeon: Lanelle Bal, DO;  [facility];  Service: Endoscopy;  Laterality: N/A;   POLYPECTOMY  04/02/2020   Procedure: POLYPECTOMY;  Surgeon: Lanelle Bal, DO;  [facility];  Service: Endoscopy;;   POLYPECTOMY  03/21/2021   Procedure: POLYPECTOMY;  Surgeon: Lanelle Bal, DO;  [facility];  Service: Endoscopy;;  duodenal   POLYPECTOMY  03/18/2022   Procedure: POLYPECTOMY;  Surgeon: Lanelle Bal, DO;  [facility];  Service: Endoscopy;;   REPAIR EXTENSOR TENDON Left 08/06/2022   Procedure: LEFT LONG FINGER EXTENSOR TENDON CENTRALIZATION;  Surgeon: Betha Loa, MD;  Location: [facility];  Service: Orthopedics;  Laterality: Left;  60 MIN   STERIOD INJECTION Left 08/06/2022   Procedure: LEFT CARPAL TUNNEL INJECTION;  Surgeon: Betha Loa, MD;  Location: [facility];  Service: Orthopedics;  Laterality: Left;   TONSILLECTOMY      Current Outpatient Medications  Medication Sig Dispense Refill   albuterol (VENTOLIN HFA) 108 (90 Base) MCG/ACT inhaler Inhale 1 puff into the lungs every 4 (four) hours as needed for wheezing or shortness of breath. 18 g 1   atorvastatin (LIPITOR) 40 MG tablet Take 1 tablet (40 mg total) by mouth daily. 90 tablet 2   buPROPion (WELLBUTRIN) 75 MG tablet Take 2 tablets (150 mg total) by mouth 2 (two) times daily. 90 tablet 3   busPIRone (BUSPAR) 10 MG tablet Take 1 tablet (10 mg total) by mouth 2 (two) times daily. 180 tablet 3   celecoxib (CELEBREX) 200 MG capsule Take 200 mg by mouth daily.     chlordiazePOXIDE (LIBRIUM) 10 MG  capsule Take 1 capsule (10 mg total) by mouth See admin instructions. Take Librium (CHLORDIAZEPOXIDE HCL CAPS) Take 1 Capsule 3 times a day for 2 days, then take 1 cap 2 times a day for 2 days, then 1 cap daily x 2 days , then STOP----this will Help You Quit Drinking Alcohol.... - Please do Not drink alcohol while taking this medication 12 capsule 0   cyanocobalamin 1000 MCG tablet Take 1 tablet (1,000 mcg total) by mouth daily. 90 tablet 1   diclofenac Sodium (VOLTAREN) 1 % GEL Apply 2 g topically 4 (four) times daily.     esomeprazole (NEXIUM) 40 MG capsule Take 1 capsule (40 mg total) by mouth 2 (two) times daily before a meal. 60 capsule 3   folic acid (FOLVITE) 1 MG tablet Take 1 tablet (1 mg total) by mouth daily. 90 tablet 3   hydrocortisone (ANUSOL-HC) 2.5 % rectal cream Place 1 Application rectally 2 (two) times daily for 14 days. 42 g 0   loperamide (IMODIUM) 2 MG capsule Take 1 capsule (2 mg total) by mouth every 6 (six) hours as needed for diarrhea or loose stools. 30 capsule 0   losartan (COZAAR) 50 MG tablet Take 1 tablet (50 mg total) by mouth daily.  90 tablet 3   Multiple Vitamin (MULTIVITAMIN WITH MINERALS) TABS tablet Take 1 tablet by mouth daily. 90 tablet 1   nicotine (NICODERM CQ - DOSED IN MG/24 HOURS) 21 mg/24hr patch Place 1 patch (21 mg total) onto the skin daily. 28 patch 0   ondansetron (ZOFRAN-ODT) 4 MG disintegrating tablet Take 1 tablet (4 mg total) by mouth every 8 (eight) hours as needed for nausea or vomiting. 20 tablet 0   Potassium Chloride ER 20 MEQ TBCR Take 1 tablet (20 mEq total) by mouth daily for 5 days. 1 tab daily by mouth 5 tablet 0   thiamine (VITAMIN B-1) 100 MG tablet Take 1 tablet (100 mg total) by mouth daily. 90 tablet 3   tiotropium (SPIRIVA) 18 MCG inhalation capsule Place 1 capsule (18 mcg total) into inhaler and inhale daily. 30 capsule 12   traZODone (DESYREL) 100 MG tablet Take 1 tablet (100 mg total) by mouth at bedtime. 90 tablet 2   No  current facility-administered medications for this visit.    Allergies as of 01/28/2023 - Review Complete 01/12/2023  Allergen Reaction Noted   Bee venom Anaphylaxis 09/09/2011    Family History  Problem Relation Age of Onset   Colon cancer Neg Hx        Pt knows little about family history    Social History   Socioeconomic History   Marital status: Legally Separated    Spouse name: Not on file   Number of children: Not on file   Years of education: Not on file   Highest education level: Not on file  Occupational History   Not on file  Tobacco Use   Smoking status: Every Day    Current packs/day: 1.00    Average packs/day: 1 pack/day for 30.0 years (30.0 ttl pk-yrs)    Types: Cigars, Cigarettes   Smokeless tobacco: Never  Vaping Use   Vaping status: Never Used  Substance and Sexual Activity   Alcohol use: Yes    Alcohol/week: 2.0 standard drinks of alcohol    Types: 2 Cans of beer per week    Comment: 2-40oz beers per day and liquor; down to 2 12 oz beer daily   Drug use: No   Sexual activity: Not Currently  Other Topics Concern   Not on file  Social History Narrative   Right handed   Social Determinants of Health   Financial Resource Strain: Not on file  Food Insecurity: No Food Insecurity (01/11/2023)   Hunger Vital Sign    Worried About Running Out of Food in the Last Year: Never true    Ran Out of Food in the Last Year: Never true  Transportation Needs: No Transportation Needs (01/11/2023)   PRAPARE - Administrator, Civil Service (Medical): No    Lack of Transportation (Non-Medical): No  Physical Activity: Not on file  Stress: Not on file  Social Connections: Not on file    Review of Systems: Gen: Denies fever, chills, anorexia. Denies fatigue, weakness, weight loss.  CV: Denies chest pain, palpitations, syncope, peripheral edema, and claudication. Resp: Denies dyspnea at rest, cough, wheezing, coughing up blood, and pleurisy. GI: Denies  vomiting blood, jaundice, and fecal incontinence.   Denies dysphagia or odynophagia. Derm: Denies rash, itching, dry skin Psych: Denies depression, anxiety, memory loss, confusion. No homicidal or suicidal ideation.  Heme: Denies bruising, bleeding, and enlarged lymph nodes.  Physical Exam: There were no vitals taken for this visit. General:   Alert and  oriented. No distress noted. Pleasant and cooperative.  Head:  Normocephalic and atraumatic. Eyes:  Conjuctiva clear without scleral icterus. Heart:  S1, S2 present without murmurs appreciated. Lungs:  Clear to auscultation bilaterally. No wheezes, rales, or rhonchi. No distress.  Abdomen:  +BS, soft, non-tender and non-distended. No rebound or guarding. No HSM or masses noted. Msk:  Symmetrical without gross deformities. Normal posture. Extremities:  Without edema. Neurologic:  Alert and  oriented x4 Psych:  Normal mood and affect.    Assessment:     Plan:  ***   [name], PA-C [facility] 01/28/2023

## 2023-01-28 ENCOUNTER — Encounter: Payer: Self-pay | Admitting: Gastroenterology

## 2023-01-28 ENCOUNTER — Ambulatory Visit: Payer: MEDICAID | Admitting: Gastroenterology

## 2023-01-28 VITALS — BP 139/76 | HR 87 | Temp 97.8°F | Ht 69.0 in | Wt 147.2 lb

## 2023-01-28 DIAGNOSIS — K529 Noninfective gastroenteritis and colitis, unspecified: Secondary | ICD-10-CM | POA: Diagnosis not present

## 2023-01-28 DIAGNOSIS — E871 Hypo-osmolality and hyponatremia: Secondary | ICD-10-CM | POA: Diagnosis not present

## 2023-01-28 DIAGNOSIS — R7989 Other specified abnormal findings of blood chemistry: Secondary | ICD-10-CM

## 2023-01-28 DIAGNOSIS — K219 Gastro-esophageal reflux disease without esophagitis: Secondary | ICD-10-CM | POA: Diagnosis not present

## 2023-01-28 DIAGNOSIS — R197 Diarrhea, unspecified: Secondary | ICD-10-CM

## 2023-01-28 DIAGNOSIS — D649 Anemia, unspecified: Secondary | ICD-10-CM

## 2023-01-28 NOTE — Patient Instructions (Addendum)
Please have blood work and he did at [facility].  You may increase Imodium to help with your diarrhea.  Take 2 capsules after your first episode of diarrhea, then 1 capsule after each additional episode of diarrhea up to 4 capsules/day.  I am going to talk with Dr. Marletta Lor about repeating your colonoscopy due to recent episode of colitis.  Continue Nexium (esomeprazole) twice daily for reflux.  Follow a GERD diet:  Avoid fried, fatty, greasy, spicy, citrus foods. Avoid caffeine and carbonated beverages. Avoid chocolate. Try eating 4-6 small meals a day rather than 3 large meals. Do not eat within 3 hours of laying down. Prop head of bed up on wood or bricks to create a 6 inch incline.  Eat 3 meals per day.  As we discussed, it is important that you quit drinking alcohol completely.  This will help your liver enzymes, inflammation in your GI tract, and your sodium levels.  Will also likely help with your diarrhea.  We will plan to follow-up in the office in 3 months or sooner if needed.  However, we will be in contact with you with your lab results and recommendations from Dr. Marletta Lor.  It was good to see you again today!  [name], PA-C [facility]

## 2023-02-08 ENCOUNTER — Telehealth: Payer: Self-pay | Admitting: Gastroenterology

## 2023-02-08 NOTE — Telephone Encounter (Signed)
Spoke to pt, informed him to have labs and stools studies done as soon as possible. He informed me that he is having transportation issues, But as soon as he gets them straight he will have them done. He is agreeable to have colonoscopy in Sept.

## 2023-02-08 NOTE — Telephone Encounter (Signed)
Routing back to Mindy to place on recall for September schedule.

## 2023-02-08 NOTE — Telephone Encounter (Signed)
Please let patient know I discussed his case with Dr. Marletta Lor who agrees that we should repeat a colonoscopy to further evaluate his diarrhea and recent episode of colitis.  Please arrange colonoscopy with propofol with Dr. Marletta Lor. ASA 3 No imodium 48 hours prior to starting colon prep.   Dx: Chronic diarrhea, colitis    Please also remind patient about having labs and stool test completed that I ordered at his OV.

## 2023-02-16 ENCOUNTER — Encounter: Payer: Self-pay | Admitting: *Deleted

## 2023-02-16 ENCOUNTER — Other Ambulatory Visit: Payer: Self-pay | Admitting: *Deleted

## 2023-02-16 MED ORDER — PEG 3350-KCL-NA BICARB-NACL 420 G PO SOLR: 4000.0000 mL | Freq: Once | ORAL | 0 refills | Status: AC

## 2023-02-16 NOTE — Telephone Encounter (Signed)
Pt has been scheduled for 03/12/23. Instructions mailed and prep sent to the pharmacy.

## 2023-03-15 ENCOUNTER — Telehealth: Payer: Self-pay | Admitting: Internal Medicine

## 2023-03-15 NOTE — Telephone Encounter (Signed)
Spoke to pt, he wanted to know if he could have labs done the same day as his pre-op appt on 9/11. I informed him that he could. He voiced understanding.

## 2023-03-15 NOTE — Telephone Encounter (Signed)
Noted  

## 2023-03-15 NOTE — Telephone Encounter (Signed)
Noted. Agree with recommendations.  

## 2023-03-15 NOTE — Telephone Encounter (Signed)
Patient left a message that he was having his procedure and has to go to the hospital Wednesday for the pre-op... unclear on what he wants really... just asked that the nurse call him.    [phone]

## 2023-03-16 NOTE — Patient Instructions (Signed)
   Your procedure is scheduled on: 03/22/2023  Report to [facility] Main Entrance at    7:30 AM.  Call this number if you have problems the morning of surgery: [phone]   Remember:              Follow Directions on the letter you received from Your Physician's office regarding the Bowel Prep              No Smoking the day of Procedure :   Take these medicines the morning of surgery with A SIP OF WATER: Wellbutrin, Celebrex, Nexium, Spiriva, and zofran if needed   Do not wear jewelry, make-up or nail polish.    Do not bring valuables to the hospital.  Contacts, dentures or bridgework may not be worn into surgery.  .   Patients discharged the day of surgery will not be allowed to drive home.     Colonoscopy, Adult, Care After This sheet gives you information about how to care for yourself after your procedure. Your health care provider may also give you more specific instructions. If you have problems or questions, contact your health care provider. What can I expect after the procedure? After the procedure, it is common to have: A small amount of blood in your stool for 24 hours after the procedure. Some gas. Mild abdominal cramping or bloating.  Follow these instructions at home: General instructions  For the first 24 hours after the procedure: Do not drive or use machinery. Do not sign important documents. Do not drink alcohol. Do your regular daily activities at a slower pace than normal. Eat soft, easy-to-digest foods. Rest often. Take over-the-counter or prescription medicines only as told by your health care provider. It is up to you to get the results of your procedure. Ask your health care provider, or the department performing the procedure, when your results will be ready. Relieving cramping and bloating Try walking around when you have cramps or feel bloated. Apply heat to your abdomen as told by your health care provider. Use a heat source that your health  care provider recommends, such as a moist heat pack or a heating pad. Place a towel between your skin and the heat source. Leave the heat on for 20-30 minutes. Remove the heat if your skin turns bright red. This is especially important if you are unable to feel pain, heat, or cold. You may have a greater risk of getting burned. Eating and drinking Drink enough fluid to keep your urine clear or pale yellow. Resume your normal diet as instructed by your health care provider. Avoid heavy or fried foods that are hard to digest. Avoid drinking alcohol for as long as instructed by your health care provider. Contact a health care provider if: You have blood in your stool 2-3 days after the procedure. Get help right away if: You have more than a small spotting of blood in your stool. You pass large blood clots in your stool. Your abdomen is swollen. You have nausea or vomiting. You have a fever. You have increasing abdominal pain that is not relieved with medicine. This information is not intended to replace advice given to you by your health care provider. Make sure you discuss any questions you have with your health care provider. Document Released: 02/04/2004 Document Revised: 03/16/2016 Document Reviewed: 09/03/2015 Elsevier Interactive Patient Education  [employer].

## 2023-03-17 ENCOUNTER — Encounter (HOSPITAL_COMMUNITY): Payer: Self-pay

## 2023-03-17 ENCOUNTER — Encounter (HOSPITAL_COMMUNITY)
Admission: RE | Admit: 2023-03-17 | Discharge: 2023-03-17 | Disposition: A | Discharge status: Home / Self Care | Payer: MEDICAID | Source: Ambulatory Visit | Attending: Internal Medicine | Admitting: Internal Medicine

## 2023-03-17 VITALS — BP 147/80 | HR 100 | Temp 97.7°F | Resp 18 | Ht 69.0 in | Wt 147.3 lb

## 2023-03-17 DIAGNOSIS — D649 Anemia, unspecified: Secondary | ICD-10-CM | POA: Diagnosis not present

## 2023-03-17 DIAGNOSIS — R197 Diarrhea, unspecified: Secondary | ICD-10-CM | POA: Insufficient documentation

## 2023-03-17 DIAGNOSIS — Z01812 Encounter for preprocedural laboratory examination: Secondary | ICD-10-CM | POA: Diagnosis present

## 2023-03-17 HISTORY — DX: Unspecified asthma, uncomplicated: J45.909

## 2023-03-17 LAB — COMPREHENSIVE METABOLIC PANEL
ALT: 109 U/L — ABNORMAL HIGH (ref 0–44)
AST: 278 U/L — ABNORMAL HIGH (ref 15–41)
Albumin: 3.2 g/dL — ABNORMAL LOW (ref 3.5–5.0)
Alkaline Phosphatase: 217 U/L — ABNORMAL HIGH (ref 38–126)
Anion gap: 12 (ref 5–15)
BUN: 9 mg/dL (ref 6–20)
CO2: 18 mmol/L — ABNORMAL LOW (ref 22–32)
Calcium: 8.5 mg/dL — ABNORMAL LOW (ref 8.9–10.3)
Chloride: 100 mmol/L (ref 98–111)
Creatinine, Ser: 1.02 mg/dL (ref 0.61–1.24)
GFR, Estimated: >60 mL/min (ref >60)
Glucose, Bld: 92 mg/dL (ref 70–99)
Potassium: 3.5 mmol/L (ref 3.5–5.1)
Sodium: 130 mmol/L — ABNORMAL LOW (ref 135–145)
Total Bilirubin: 1.4 mg/dL — ABNORMAL HIGH (ref 0.3–1.2)
Total Protein: 6.9 g/dL (ref 6.5–8.1)

## 2023-03-17 LAB — CBC WITH DIFFERENTIAL/PLATELET
Abs Immature Granulocytes: 0.01 10*3/uL (ref 0.00–0.07)
Basophils Absolute: 0 10*3/uL (ref 0.0–0.1)
Basophils Relative: 1 %
Eosinophils Absolute: 0 10*3/uL (ref 0.0–0.5)
Eosinophils Relative: 1 %
HCT: 30.3 % — ABNORMAL LOW (ref 39.0–52.0)
Hemoglobin: 10.4 g/dL — ABNORMAL LOW (ref 13.0–17.0)
Immature Granulocytes: 0 %
Lymphocytes Relative: 32 %
Lymphs Abs: 0.8 10*3/uL (ref 0.7–4.0)
MCH: 37.3 pg — ABNORMAL HIGH (ref 26.0–34.0)
MCHC: 34.3 g/dL (ref 30.0–36.0)
MCV: 108.6 fL — ABNORMAL HIGH (ref 80.0–100.0)
Monocytes Absolute: 0.3 10*3/uL (ref 0.1–1.0)
Monocytes Relative: 12 %
Neutro Abs: 1.3 10*3/uL — ABNORMAL LOW (ref 1.7–7.7)
Neutrophils Relative %: 54 %
Platelets: 93 10*3/uL — ABNORMAL LOW (ref 150–400)
RBC: 2.79 MIL/uL — ABNORMAL LOW (ref 4.22–5.81)
RDW: 18.7 % — ABNORMAL HIGH (ref 11.5–15.5)
WBC: 2.4 10*3/uL — ABNORMAL LOW (ref 4.0–10.5)
nRBC: 0 % (ref 0.0–0.2)

## 2023-03-19 NOTE — Telephone Encounter (Signed)
Pt called and LMOVM regarding his lab results before his procedure. If this pt's labs are not urgent I will or Toni Amend will call Monday because you cannot get off the phone with this pt and we have to clock out on time per Reba

## 2023-03-19 NOTE — Telephone Encounter (Signed)
error 

## 2023-03-22 ENCOUNTER — Encounter (HOSPITAL_COMMUNITY): Payer: Self-pay

## 2023-03-22 ENCOUNTER — Encounter (HOSPITAL_COMMUNITY)
Admission: RE | Disposition: A | Discharge status: Home / Self Care | Payer: Self-pay | Source: Home / Self Care | Attending: Internal Medicine

## 2023-03-22 ENCOUNTER — Ambulatory Visit (HOSPITAL_BASED_OUTPATIENT_CLINIC_OR_DEPARTMENT_OTHER): Payer: MEDICAID | Admitting: Certified Registered"

## 2023-03-22 ENCOUNTER — Ambulatory Visit (HOSPITAL_COMMUNITY)
Admission: RE | Admit: 2023-03-22 | Discharge: 2023-03-22 | Disposition: A | Discharge status: Home / Self Care | Payer: MEDICAID | Attending: Internal Medicine | Admitting: Internal Medicine

## 2023-03-22 ENCOUNTER — Ambulatory Visit (HOSPITAL_COMMUNITY): Payer: MEDICAID | Admitting: Certified Registered"

## 2023-03-22 DIAGNOSIS — K529 Noninfective gastroenteritis and colitis, unspecified: Secondary | ICD-10-CM | POA: Insufficient documentation

## 2023-03-22 DIAGNOSIS — K5730 Diverticulosis of large intestine without perforation or abscess without bleeding: Secondary | ICD-10-CM | POA: Insufficient documentation

## 2023-03-22 DIAGNOSIS — F17290 Nicotine dependence, other tobacco product, uncomplicated: Secondary | ICD-10-CM | POA: Insufficient documentation

## 2023-03-22 DIAGNOSIS — K648 Other hemorrhoids: Secondary | ICD-10-CM | POA: Insufficient documentation

## 2023-03-22 DIAGNOSIS — I10 Essential (primary) hypertension: Secondary | ICD-10-CM | POA: Diagnosis not present

## 2023-03-22 DIAGNOSIS — I2510 Atherosclerotic heart disease of native coronary artery without angina pectoris: Secondary | ICD-10-CM

## 2023-03-22 DIAGNOSIS — F17210 Nicotine dependence, cigarettes, uncomplicated: Secondary | ICD-10-CM | POA: Insufficient documentation

## 2023-03-22 DIAGNOSIS — K219 Gastro-esophageal reflux disease without esophagitis: Secondary | ICD-10-CM | POA: Insufficient documentation

## 2023-03-22 DIAGNOSIS — J449 Chronic obstructive pulmonary disease, unspecified: Secondary | ICD-10-CM | POA: Diagnosis not present

## 2023-03-22 DIAGNOSIS — J4489 Other specified chronic obstructive pulmonary disease: Secondary | ICD-10-CM | POA: Diagnosis not present

## 2023-03-22 DIAGNOSIS — R933 Abnormal findings on diagnostic imaging of other parts of digestive tract: Secondary | ICD-10-CM | POA: Diagnosis present

## 2023-03-22 DIAGNOSIS — K649 Unspecified hemorrhoids: Secondary | ICD-10-CM | POA: Diagnosis not present

## 2023-03-22 HISTORY — PX: COLONOSCOPY WITH PROPOFOL: SHX5780

## 2023-03-22 HISTORY — PX: BIOPSY: SHX5522

## 2023-03-22 SURGERY — COLONOSCOPY WITH PROPOFOL
Anesthesia: General

## 2023-03-22 MED ORDER — DEXMEDETOMIDINE HCL IN NACL 80 MCG/20ML IV SOLN
INTRAVENOUS | Status: DC | PRN
Start: 2023-03-22 — End: 2023-03-22
  Administered 2023-03-22: 8 ug via INTRAVENOUS

## 2023-03-22 MED ORDER — PROPOFOL 10 MG/ML IV BOLUS
INTRAVENOUS | Status: DC | PRN
  Administered 2023-03-22: 60 mg via INTRAVENOUS
  Administered 2023-03-22: 40 mg via INTRAVENOUS
  Administered 2023-03-22: 100 mg via INTRAVENOUS
  Administered 2023-03-22: 50 mg via INTRAVENOUS

## 2023-03-22 MED ORDER — LACTATED RINGERS IV SOLN: INTRAVENOUS | Status: DC | PRN

## 2023-03-22 MED ORDER — LIDOCAINE HCL (CARDIAC) PF 100 MG/5ML IV SOSY
PREFILLED_SYRINGE | INTRAVENOUS | Status: DC | PRN
  Administered 2023-03-22: 50 mg via INTRAVENOUS

## 2023-03-22 MED ORDER — LACTATED RINGERS IV SOLN: INTRAVENOUS | Status: DC

## 2023-03-22 NOTE — Anesthesia Procedure Notes (Signed)
Date/Time: 03/22/2023 9:39 AM  Performed by: Julian Reil, CRNAPre-anesthesia Checklist: Patient identified, Emergency Drugs available, Suction available and Patient being monitored Patient Re-evaluated:Patient Re-evaluated prior to induction Oxygen Delivery Method: Nasal cannula Induction Type: IV induction Placement Confirmation: positive ETCO2

## 2023-03-22 NOTE — Op Note (Signed)
[facility] Patient Name: Gabriel Koch Procedure Date: 03/22/2023 9:26 AM MRN: [id number] Date of Birth: Mar 19, 1966 Attending MD: Hennie Duos. Marletta Lor , [place], [id number] CSN: [id number] Age: 56 Admit Type: Outpatient Procedure:                Colonoscopy Indications:              Abnormal CT of the GI tract, Follow-up of colitis Providers:                Hennie Duos. Marletta Lor, DO, Sheran Fava, Pandora Leiter, Technician Referring MD:              Medicines:                See the Anesthesia note for documentation of the                            administered medications Complications:            No immediate complications. Estimated Blood Loss:     Estimated blood loss was minimal. Procedure:                Pre-Anesthesia Assessment:                           - The anesthesia plan was to use monitored                            anesthesia care (MAC).                           After obtaining informed consent, the colonoscope                            was passed under direct vision. Throughout the                            procedure, the patient's blood pressure, pulse, and                            oxygen saturations were monitored continuously. The                            [id number]-HQ190L ([id number]) scope was introduced through                            the anus and advanced to the the terminal ileum,                            with identification of the appendiceal orifice and                            IC valve. The colonoscopy was performed without                            difficulty.  The patient tolerated the procedure                            well. The quality of the bowel preparation was                            evaluated using the BBPS [facility] Bowel Preparation                            Scale) with scores of: Right Colon = 2 (minor                            amount of residual staining, small fragments of                            stool  and/or opaque liquid, but mucosa seen well),                            Transverse Colon = 2 (minor amount of residual                            staining, small fragments of stool and/or opaque                            liquid, but mucosa seen well) and Left Colon = 3                            (entire mucosa seen well with no residual staining,                            small fragments of stool or opaque liquid). The                            total BBPS score equals 7. The quality of the bowel                            preparation was good. Scope In: 9:39:10 AM Scope Out: 9:54:10 AM Scope Withdrawal Time: 0 hours 12 minutes 4 seconds  Total Procedure Duration: 0 hours 15 minutes 0 seconds  Findings:      Non-bleeding internal hemorrhoids were found.      Multiple small-mouthed diverticula were found in the sigmoid colon.      The terminal ileum appeared normal. Biopsies were taken with a cold       forceps for histology.      Biopsies were taken with a cold forceps in the entire colon for       histology.      A localized area of mildly friable mucosa with contact bleeding was       again found in the ascending colon and in the cecum. Impression:               - Non-bleeding internal hemorrhoids.                           -  Diverticulosis in the sigmoid colon.                           - The examined portion of the ileum was normal.                            Biopsied.                           - Friability with contact bleeding in the ascending                            colon and in the cecum.                           - Biopsies were taken with a cold forceps for                            histology in the entire colon. Moderate Sedation:      Per Anesthesia Care Recommendation:           - Patient has a contact number available for                            emergencies. The signs and symptoms of potential                            delayed complications were discussed with  the                            patient. Return to normal activities tomorrow.                            Written discharge instructions were provided to the                            patient.                           - Resume previous diet.                           - Continue present medications.                           - Await pathology results.                           - Repeat colonoscopy in 5 years for surveillance.                           - Return to GI clinic in 3 months. Procedure Code(s):        --- Professional ---                           [phone], Colonoscopy, flexible; with biopsy, single  or multiple Diagnosis Code(s):        --- Professional ---                           K64.8, Other hemorrhoids                           K92.2, Gastrointestinal hemorrhage, unspecified                           K52.9, Noninfective gastroenteritis and colitis,                            unspecified                           K57.30, Diverticulosis of large intestine without                            perforation or abscess without bleeding                           R93.3, Abnormal findings on diagnostic imaging of                            other parts of digestive tract CPT copyright 2022 American Medical Association. All rights reserved. The codes documented in this report are preliminary and upon coder review may  be revised to meet current compliance requirements. Hennie Duos. Marletta Lor, DO Hennie Duos. Marletta Lor, DO 03/22/2023 10:00:26 AM This report has been signed electronically. Number of Addenda: 0

## 2023-03-22 NOTE — Anesthesia Preprocedure Evaluation (Signed)
Anesthesia Evaluation  Patient identified by MRN, date of birth, ID band Patient awake    Reviewed: Allergy & Precautions, H&P , NPO status , Patient's Chart, lab work & pertinent test results, reviewed documented beta blocker date and time   History of Anesthesia Complications (+) history of anesthetic complications  Airway Mallampati: II  TM Distance: >3 FB Neck ROM: full    Dental no notable dental hx.    Pulmonary neg pulmonary ROS, asthma , COPD, Current Smoker   Pulmonary exam normal breath sounds clear to auscultation       Cardiovascular Exercise Tolerance: Good hypertension, + CAD  negative cardio ROS  Rhythm:regular Rate:Normal     Neuro/Psych  PSYCHIATRIC DISORDERS Anxiety Depression    CVA negative neurological ROS  negative psych ROS   GI/Hepatic negative GI ROS, Neg liver ROS,GERD  ,,  Endo/Other  negative endocrine ROS    Renal/GU negative Renal ROS  negative genitourinary   Musculoskeletal   Abdominal   Peds  Hematology negative hematology ROS (+) Blood dyscrasia, anemia   Anesthesia Other Findings   Reproductive/Obstetrics negative OB ROS                             Anesthesia Physical Anesthesia Plan  ASA: 3  Anesthesia Plan: General   Post-op Pain Management:    Induction:   PONV Risk Score and Plan: Propofol infusion  Airway Management Planned:   Additional Equipment:   Intra-op Plan:   Post-operative Plan:   Informed Consent: I have reviewed the patients History and Physical, chart, labs and discussed the procedure including the risks, benefits and alternatives for the proposed anesthesia with the patient or authorized representative who has indicated his/her understanding and acceptance.     Dental Advisory Given  Plan Discussed with: CRNA  Anesthesia Plan Comments:        Anesthesia Quick Evaluation

## 2023-03-22 NOTE — Telephone Encounter (Signed)
Reviewed his labs completed 03/17/2023.  LFTs are elevated, higher than previously.  This is likely secondary to chronic, daily alcohol use.  His hemoglobin is low, but stable in the 10 range with large red blood cells, likely related to B12 deficiency and possibly folate deficiency again in the setting of chronic alcohol use.  Platelets are also low which is also chronic and likely secondary to chronic alcohol use as recent imaging has not shown cirrhosis.   Recommend repeating HFP, check INR, and B12/folate panel.  He needs to abstain from alcohol.   Courtney:  Please let patient know the above and arrange labs.  Diagnosis: Elevated LFTs, anemia

## 2023-03-22 NOTE — Progress Notes (Signed)
Latest Reference Range & Units 03/17/23 08:48  COMPREHENSIVE METABOLIC PANEL  Rpt !  Sodium 135 - 145 mmol/L 130 (L)  Potassium 3.5 - 5.1 mmol/L 3.5  Chloride 98 - 111 mmol/L 100  CO2 22 - 32 mmol/L 18 (L)  Glucose 70 - 99 mg/dL 92  BUN 6 - 20 mg/dL 9  Creatinine 8.29 - 5.62 mg/dL 1.30  Calcium 8.9 - 86.5 mg/dL 8.5 (L)  Anion gap 5 - 15  12  Alkaline Phosphatase 38 - 126 U/L 217 (H)  Albumin 3.5 - 5.0 g/dL 3.2 (L)  AST 15 - 41 U/L 278 (H)  ALT 0 - 44 U/L 109 (H)  Total Protein 6.5 - 8.1 g/dL 6.9  Total Bilirubin 0.3 - 1.2 mg/dL 1.4 (H)  GFR, Estimated >60 mL/min >60  WBC 4.0 - 10.5 K/uL 2.4 (L)  RBC 4.22 - 5.81 MIL/uL 2.79 (L)  Hemoglobin 13.0 - 17.0 g/dL 78.4 (L)  HCT 69.6 - 29.5 % 30.3 (L)  MCV 80.0 - 100.0 fL 108.6 (H)  MCH 26.0 - 34.0 pg 37.3 (H)  MCHC 30.0 - 36.0 g/dL 28.4  RDW 13.2 - 44.0 % 18.7 (H)  Platelets 150 - 400 K/uL 93 (L)  nRBC 0.0 - 0.2 % 0.0  Neutrophils % 54  Lymphocytes % 32  Monocytes Relative % 12  Eosinophil % 1  Basophil % 1  Immature Granulocytes % 0  NEUT# 1.7 - 7.7 K/uL 1.3 (L)  Lymphocyte # 0.7 - 4.0 K/uL 0.8  Monocyte # 0.1 - 1.0 K/uL 0.3  Eosinophils Absolute 0.0 - 0.5 K/uL 0.0  Basophils Absolute 0.0 - 0.1 K/uL 0.0  Abs Immature Granulocytes 0.00 - 0.07 K/uL 0.01  RBC Morphology  MORPHOLOGY UNREMARKABLE  WBC Morphology  MORPHOLOGY UNREMARKABLE  Smear Review  PLATELET COUNT CONFIRMED BY SMEAR  !: Data is abnormal (L): Data is abnormally low (H): Data is abnormally high Rpt: View report in Results Review for more information

## 2023-03-22 NOTE — Transfer of Care (Signed)
Immediate Anesthesia Transfer of Care Note  Patient: Gabriel Koch  Procedure(s) Performed: COLONOSCOPY WITH PROPOFOL BIOPSY  Patient Location: Short Stay  Anesthesia Type:General  Level of Consciousness: awake, alert , and oriented  Airway & Oxygen Therapy: Patient Spontanous Breathing  Post-op Assessment: Report given to RN and Post -op Vital signs reviewed and stable  Post vital signs: Reviewed and stable  Last Vitals:  Vitals Value Taken Time  BP    Temp    Pulse    Resp    SpO2      Last Pain:  Vitals:   03/22/23 0934  PainSc: 8          Complications: No notable events documented.

## 2023-03-22 NOTE — Discharge Instructions (Addendum)
  Colonoscopy Discharge Instructions  Read the instructions outlined below and refer to this sheet in the next few weeks. These discharge instructions provide you with general information on caring for yourself after you leave the hospital. Your doctor may also give you specific instructions. While your treatment has been planned according to the most current medical practices available, unavoidable complications occasionally occur.   ACTIVITY You may resume your regular activity, but move at a slower pace for the next 24 hours.  Take frequent rest periods for the next 24 hours.  Walking will help get rid of the air and reduce the bloated feeling in your belly (abdomen).  No driving for 24 hours (because of the medicine (anesthesia) used during the test).   Do not sign any important legal documents or operate any machinery for 24 hours (because of the anesthesia used during the test).  NUTRITION Drink plenty of fluids.  You may resume your normal diet as instructed by your doctor.  Begin with a light meal and progress to your normal diet. Heavy or fried foods are harder to digest and may make you feel sick to your stomach (nauseated).  Avoid alcoholic beverages for 24 hours or as instructed.  MEDICATIONS You may resume your normal medications unless your doctor tells you otherwise.  WHAT YOU CAN EXPECT TODAY Some feelings of bloating in the abdomen.  Passage of more gas than usual.  Spotting of blood in your stool or on the toilet paper.  IF YOU HAD POLYPS REMOVED DURING THE COLONOSCOPY: No aspirin products for 7 days or as instructed.  No alcohol for 7 days or as instructed.  Eat a soft diet for the next 24 hours.  FINDING OUT THE RESULTS OF YOUR TEST Not all test results are available during your visit. If your test results are not back during the visit, make an appointment with your caregiver to find out the results. Do not assume everything is normal if you have not heard from your  caregiver or the medical facility. It is important for you to follow up on all of your test results.  SEEK IMMEDIATE MEDICAL ATTENTION IF: You have more than a spotting of blood in your stool.  Your belly is swollen (abdominal distention).  You are nauseated or vomiting.  You have a temperature over 101.  You have abdominal pain or discomfort that is severe or gets worse throughout the day.   Overall, your colon appeared very healthy.  I did not see any active inflammation indicative of underlying inflammatory bowel disease such as Crohn's disease or ulcerative colitis throughout your colon or end portion of your small bowel.  I took biopsies of your colon and small bowel to further evaluate.  Await pathology results, my office will contact you. No polyps or evidence of colon cancer. Follow up in GI office as previously scheduled.    I hope you have a great rest of your week!  Hennie Duos. Marletta Lor, D.O. Gastroenterology and Hepatology [facility]

## 2023-03-22 NOTE — H&P (Signed)
Primary Care Physician:  Elfredia Nevins, MD Primary Gastroenterologist:  Dr. Marletta Lor  Pre-Procedure History & Physical: HPI:  Gabriel Koch is a 57 y.o. male is here  for a colonoscopy to be performed for colitis  Past Medical History:  Diagnosis Date   Alcohol abuse    Anxiety and depression    Asthma    Blood transfusion    Complication of anesthesia    patient hemorrrhaged about 2 weeks after tonsillecotmy   COPD (chronic obstructive pulmonary disease) (HCC)    GERD (gastroesophageal reflux disease)    Hypertension    PTSD (post-traumatic stress disorder)    Stroke (HCC) 2021    Past Surgical History:  Procedure Laterality Date   APPENDECTOMY     BIOPSY  04/02/2020   Procedure: BIOPSY;  Surgeon: Lanelle Bal, DO;  Location: [facility];  Service: Endoscopy;;   BIOPSY  03/18/2022   Procedure: BIOPSY;  Surgeon: Lanelle Bal, DO;  [facility]: [facility];  Service: Endoscopy;;   COLONOSCOPY WITH PROPOFOL N/A 04/02/2020   Procedure: COLONOSCOPY WITH PROPOFOL;  Surgeon: Lanelle Bal, DO;  [facility]: [facility];  Service: Endoscopy;  Laterality: N/A;  8:30am   COLONOSCOPY WITH PROPOFOL N/A 03/18/2022   Procedure: COLONOSCOPY WITH PROPOFOL;  Surgeon: Lanelle Bal, DO;  [facility]: [facility];  Service: Endoscopy;  Laterality: N/A;  1:15 pm, pt knows to arrive at 10:45 can't come earlier due to transportation   ESOPHAGOGASTRODUODENOSCOPY (EGD) WITH PROPOFOL N/A 04/02/2020   Procedure: ESOPHAGOGASTRODUODENOSCOPY (EGD) WITH PROPOFOL;  Surgeon: Lanelle Bal, DO;  [facility]: [facility];  Service: Endoscopy;  Laterality: N/A;   ESOPHAGOGASTRODUODENOSCOPY (EGD) WITH PROPOFOL N/A 03/21/2021   Procedure: ESOPHAGOGASTRODUODENOSCOPY (EGD) WITH PROPOFOL;  Surgeon: Lanelle Bal, DO;  [facility]: [facility];  Service: Endoscopy;  Laterality: N/A;  11:00am   ESOPHAGOGASTRODUODENOSCOPY (EGD) WITH PROPOFOL N/A 03/18/2022   Procedure: ESOPHAGOGASTRODUODENOSCOPY (EGD)  WITH PROPOFOL;  Surgeon: Lanelle Bal, DO;  [facility]: [facility];  Service: Endoscopy;  Laterality: N/A;   POLYPECTOMY  04/02/2020   Procedure: POLYPECTOMY;  Surgeon: Lanelle Bal, DO;  [facility];  Service: Endoscopy;;   POLYPECTOMY  03/21/2021   Procedure: POLYPECTOMY;  Surgeon: Lanelle Bal, DO;  [facility]: [facility];  Service: Endoscopy;;  duodenal   POLYPECTOMY  03/18/2022   Procedure: POLYPECTOMY;  Surgeon: Lanelle Bal, DO;  [facility]: [facility];  Service: Endoscopy;;   REPAIR EXTENSOR TENDON Left 08/06/2022   Procedure: LEFT LONG FINGER EXTENSOR TENDON CENTRALIZATION;  Surgeon: Betha Loa, MD;  Location: [facility];  Service: Orthopedics;  Laterality: Left;  60 MIN   STERIOD INJECTION Left 08/06/2022   Procedure: LEFT CARPAL TUNNEL INJECTION;  Surgeon: Betha Loa, MD;  Location:  SURGERY CENTER;  Service: Orthopedics;  Laterality: Left;   TONSILLECTOMY      Prior to Admission medications   Medication Sig Start Date End Date Taking? Authorizing Provider  albuterol (VENTOLIN HFA) 108 (90 Base) MCG/ACT inhaler Inhale 1 puff into the lungs every 4 (four) hours as needed for wheezing or shortness of breath. 01/18/23  Yes Emokpae, Courage, MD  atorvastatin (LIPITOR) 40 MG tablet Take 1 tablet (40 mg total) by mouth daily. 01/18/23  Yes Emokpae, Courage, MD  buPROPion (WELLBUTRIN) 75 MG tablet Take 2 tablets (150 mg total) by mouth 2 (two) times daily. 01/18/23  Yes Emokpae, Courage, MD  busPIRone (BUSPAR) 10 MG tablet Take 1 tablet (10 mg total) by  mouth 2 (two) times daily. 01/18/23  Yes Emokpae, Courage, MD  celecoxib (CELEBREX) 200 MG capsule Take 200 mg by mouth daily.   Yes [provider]  cyanocobalamin 1000 MCG tablet Take 1 tablet (1,000 mcg total) by mouth daily. 01/19/23  Yes Shon Hale, MD  esomeprazole (NEXIUM) 40 MG capsule Take 1 capsule (40 mg total) by mouth 2 (two) times daily before a meal. 01/18/23   Yes Emokpae, Courage, MD  folic acid (FOLVITE) 1 MG tablet Take 1 tablet (1 mg total) by mouth daily. 01/18/23  Yes Emokpae, Courage, MD  losartan (COZAAR) 50 MG tablet Take 1 tablet (50 mg total) by mouth daily. 01/18/23  Yes Shon Hale, MD  Multiple Vitamin (MULTIVITAMIN WITH MINERALS) TABS tablet Take 1 tablet by mouth daily. 01/19/23  Yes Emokpae, Courage, MD  thiamine (VITAMIN B-1) 100 MG tablet Take 1 tablet (100 mg total) by mouth daily. 01/19/23  Yes Emokpae, Courage, MD  tiotropium (SPIRIVA) 18 MCG inhalation capsule Place 1 capsule (18 mcg total) into inhaler and inhale daily. 01/18/23  Yes Shon Hale, MD  traZODone (DESYREL) 100 MG tablet Take 1 tablet (100 mg total) by mouth at bedtime. 01/18/23  Yes Emokpae, Courage, MD  chlordiazePOXIDE (LIBRIUM) 10 MG capsule Take 1 capsule (10 mg total) by mouth See admin instructions. Take Librium (CHLORDIAZEPOXIDE HCL CAPS) Take 1 Capsule 3 times a day for 2 days, then take 1 cap 2 times a day for 2 days, then 1 cap daily x 2 days , then STOP----this will Help You Quit Drinking Alcohol.... - Please do Not drink alcohol while taking this medication Patient not taking: Reported on 01/28/2023 01/18/23   Shon Hale, MD  diclofenac Sodium (VOLTAREN) 1 % GEL Apply 2 g topically 4 (four) times daily.    [provider]  loperamide (IMODIUM) 2 MG capsule Take 1 capsule (2 mg total) by mouth every 6 (six) hours as needed for diarrhea or loose stools. 01/18/23   Shon Hale, MD  nicotine (NICODERM CQ - DOSED IN MG/24 HOURS) 21 mg/24hr patch Place 1 patch (21 mg total) onto the skin daily. 01/19/23   Shon Hale, MD  ondansetron (ZOFRAN-ODT) 4 MG disintegrating tablet Take 1 tablet (4 mg total) by mouth every 8 (eight) hours as needed for nausea or vomiting. 01/18/23   Shon Hale, MD  Potassium Chloride ER 20 MEQ TBCR Take 1 tablet (20 mEq total) by mouth daily for 5 days. 1 tab daily by mouth 01/18/23 01/28/23  Shon Hale, MD     Allergies as of 02/16/2023 - Review Complete 01/28/2023  Allergen Reaction Noted   Bee venom Anaphylaxis 09/09/2011    Family History  Problem Relation Age of Onset   Colon cancer Neg Hx        Pt knows little about family history    Social History   Socioeconomic History   Marital status: Legally Separated    Spouse name: Not on file   Number of children: Not on file   Years of education: Not on file   Highest education level: Not on file  Occupational History   Not on file  Tobacco Use   Smoking status: Every Day    Current packs/day: 1.00    Average packs/day: 1 pack/day for 30.0 years (30.0 ttl pk-yrs)    Types: Cigars, Cigarettes   Smokeless tobacco: Never  Vaping Use   Vaping status: Never Used  Substance and Sexual Activity   Alcohol use: Yes    Alcohol/week: 2.0  standard drinks of alcohol    Types: 2 Cans of beer per week    Comment: 2-40oz beers per day and liquor; current 6-8 12 oz peer a day.   Drug use: No   Sexual activity: Not Currently  Other Topics Concern   Not on file  Social History Narrative   Right handed   Social Determinants of Health   Financial Resource Strain: Not on file  Food Insecurity: No Food Insecurity (01/11/2023)   Hunger Vital Sign    Worried About Running Out of Food in the Last Year: Never true    Ran Out of Food in the Last Year: Never true  Transportation Needs: No Transportation Needs (01/11/2023)   PRAPARE - Administrator, Civil Service (Medical): No    Lack of Transportation (Non-Medical): No  Physical Activity: Not on file  Stress: Not on file  Social Connections: Not on file  Intimate Partner Violence: Not At Risk (01/11/2023)   Humiliation, Afraid, Rape, and Kick questionnaire    Fear of Current or Ex-Partner: No    Emotionally Abused: No    Physically Abused: No    Sexually Abused: No    Review of Systems: See HPI, otherwise negative ROS  Physical Exam: Vital signs in last 24 hours: Pulse  Rate:  [98] 98 (09/16 0908) Resp:  [15-18] 18 (09/16 0915) BP: (180)/(110) 180/110 (09/16 0908) SpO2:  [100 %] 100 % (09/16 0908)   General:   Alert,  Well-developed, well-nourished, pleasant and cooperative in NAD Head:  Normocephalic and atraumatic. Eyes:  Sclera clear, no icterus.   Conjunctiva pink. Ears:  Normal auditory acuity. Nose:  No deformity, discharge,  or lesions. Msk:  Symmetrical without gross deformities. Normal posture. Extremities:  Without clubbing or edema. Neurologic:  Alert and  oriented x4;  grossly normal neurologically. Skin:  Intact without significant lesions or rashes. Psych:  Alert and cooperative. Normal mood and affect.  Impression/Plan: Windle Guard is here for a colonoscopy to be performed for colitis  The risks of the procedure including infection, bleed, or perforation as well as benefits, limitations, alternatives and imponderables have been reviewed with the patient. Questions have been answered. All parties agreeable.

## 2023-03-23 LAB — SURGICAL PATHOLOGY

## 2023-03-24 ENCOUNTER — Other Ambulatory Visit: Payer: Self-pay | Admitting: *Deleted

## 2023-03-24 DIAGNOSIS — R7989 Other specified abnormal findings of blood chemistry: Secondary | ICD-10-CM

## 2023-03-24 DIAGNOSIS — D649 Anemia, unspecified: Secondary | ICD-10-CM

## 2023-03-24 NOTE — Telephone Encounter (Signed)
He will need to discuss medication options with his primary care provider. He can also discuss other options with them as well including treatment centers, support groups etc.

## 2023-03-24 NOTE — Telephone Encounter (Signed)
Noted. Informed pt.

## 2023-03-24 NOTE — Telephone Encounter (Signed)
Spoke to pt, informed him of results and recommendations. Pt voiced understanding. Pt states he is trying to stop drinking, but he needs a pill to help him. I informed him that I would ask about that.

## 2023-03-24 NOTE — Telephone Encounter (Signed)
LMOM for pt to call office  

## 2023-03-27 NOTE — Anesthesia Postprocedure Evaluation (Signed)
Anesthesia Post Note  Patient: Gabriel Koch  Procedure(s) Performed: COLONOSCOPY WITH PROPOFOL BIOPSY  Patient location during evaluation: Phase II Anesthesia Type: General Level of consciousness: awake Pain management: pain level controlled Vital Signs Assessment: post-procedure vital signs reviewed and stable Respiratory status: spontaneous breathing and respiratory function stable Cardiovascular status: blood pressure returned to baseline and stable Postop Assessment: no headache and no apparent nausea or vomiting Anesthetic complications: no Comments: Late entry   No notable events documented.   Last Vitals:  Vitals:   03/22/23 0915 03/22/23 0958  BP:  124/66  Pulse:  95  Resp: 18 20  Temp:  (!) 36.4 C  SpO2:  97%    Last Pain:  Vitals:   03/23/23 1339  TempSrc:   PainSc: 0-No pain                 Windell Norfolk

## 2023-03-29 ENCOUNTER — Other Ambulatory Visit: Payer: Self-pay

## 2023-03-31 ENCOUNTER — Encounter (HOSPITAL_COMMUNITY): Payer: Self-pay | Admitting: Internal Medicine

## 2023-04-07 ENCOUNTER — Other Ambulatory Visit (HOSPITAL_COMMUNITY): Payer: Self-pay | Admitting: Internal Medicine

## 2023-04-07 ENCOUNTER — Other Ambulatory Visit (HOSPITAL_COMMUNITY)
Admission: RE | Admit: 2023-04-07 | Discharge: 2023-04-07 | Disposition: A | Discharge status: Home / Self Care | Payer: MEDICAID | Source: Ambulatory Visit | Attending: Gastroenterology | Admitting: Gastroenterology

## 2023-04-07 DIAGNOSIS — R7989 Other specified abnormal findings of blood chemistry: Secondary | ICD-10-CM | POA: Diagnosis present

## 2023-04-07 DIAGNOSIS — D649 Anemia, unspecified: Secondary | ICD-10-CM | POA: Insufficient documentation

## 2023-04-07 DIAGNOSIS — R0789 Other chest pain: Secondary | ICD-10-CM

## 2023-04-07 LAB — VITAMIN B12: Vitamin B-12: 1412 pg/mL — ABNORMAL HIGH (ref 180–914)

## 2023-04-07 LAB — HEPATIC FUNCTION PANEL
ALT: 84 U/L — ABNORMAL HIGH (ref 0–44)
AST: 254 U/L — ABNORMAL HIGH (ref 15–41)
Albumin: 3.4 g/dL — ABNORMAL LOW (ref 3.5–5.0)
Alkaline Phosphatase: 290 U/L — ABNORMAL HIGH (ref 38–126)
Bilirubin, Direct: 0.6 mg/dL — ABNORMAL HIGH (ref 0.0–0.2)
Indirect Bilirubin: 0.4 mg/dL (ref 0.3–0.9)
Total Bilirubin: 1 mg/dL (ref 0.3–1.2)
Total Protein: 7.3 g/dL (ref 6.5–8.1)

## 2023-04-07 LAB — PROTIME-INR
INR: 0.9 (ref 0.8–1.2)
Prothrombin Time: 12.7 s (ref 11.4–15.2)

## 2023-04-07 LAB — FOLATE: Folate: 17.9 ng/mL (ref >5.9)

## 2023-04-14 ENCOUNTER — Telehealth: Payer: Self-pay | Admitting: Gastroenterology

## 2023-04-14 NOTE — Telephone Encounter (Signed)
Patient's cousin, Raeanne Gathers, came by and asked to speak to you about his records.  When I asked what she was referring to she said that the patient didn't understand some things and she wanted to know if he was supposed to be getting some x-rays done.  Please call her back to discuss.  She is now on his DPR and that has been scanned into the chart.

## 2023-04-14 NOTE — Telephone Encounter (Signed)
Spoke to pt and his cousin, informed them of recommendations. They voiced understanding.

## 2023-04-26 ENCOUNTER — Ambulatory Visit (HOSPITAL_COMMUNITY)
Admission: RE | Admit: 2023-04-26 | Discharge: 2023-04-26 | Disposition: A | Discharge status: Home / Self Care | Payer: MEDICAID | Source: Ambulatory Visit | Attending: Internal Medicine | Admitting: Internal Medicine

## 2023-04-26 DIAGNOSIS — R0789 Other chest pain: Secondary | ICD-10-CM | POA: Insufficient documentation

## 2023-04-27 NOTE — Progress Notes (Signed)
Referring Provider: Elfredia Nevins, MD Primary Care Physician:  Elfredia Nevins, MD Primary GI Physician: Dr. Marletta Lor  Chief Complaint  Patient presents with   Follow-up    Follow up. Having some rectal bleeding for the last for days. Having some stomach pain and left side pain    HPI:   Gabriel Koch is a 57 y.o. male with history of GERD, Barrett's esophagus, duodenal tubular adenoma, adenomatous colon polyps, hemorrhoids s/p banding x 3 in 2023, elevated LFTs, alcohol abuse, chronic diarrhea, colitis in July 2024, presenting today for follow-up s/p colonoscopy.   Last seen in the office 01/28/2023 for hospital follow-up of colitis.  He had been treated empirically with antibiotics while inpatient with resolution of nocturnal stools, nausea, vomiting, and improvement in his abdominal pain, but he continued to have diarrhea, reporting at least 4 bowel movements a day.  Some occasional abdominal cramping related to bowel movements, but nothing routine.  Noted history of chronic diarrhea which could be related to chronic alcohol abuse versus now with postinfectious IBS symptoms.  Also noted on prior colonoscopy in September 2023, he had a segmental area of moderately friable mucosa in the transverse colon and ascending colon with benign biopsies.  Recommended updating a colonoscopy, check pancreatic elastase, use Imodium to control diarrhea for now. Also planned to recheck Cbc, Cmp, and counseled on importance of strict alcohol abstinence.   Last completed 03/17/2023: AST 278, ALT 109, alk phos 217, total bilirubin 1.4. Platelets 93. Hemoglobin 10.4 with MCV 108.6 and MCH 37.3. Recommend repeating HFP, INR, B12/folate panel, and abstain from alcohol.  Labs 04/07/2023: AST 254, ALT 84, alkaline phosphatase 290, total bilirubin 1.0. INR 0.9 Vitamin B12 1412 Folate 17.9   Pancreatic elastase was not completed.    Colonoscopy 03/22/2023  Nonbleeding internal hemorrhoids, multiple  sigmoid diverticula, normal TI s/p biopsy, localized area of mildly friable mucosa with contact bleeding again found in the ascending colon and in the cecum.  Biopsies were taken from the entire colon. Pathology: TI biopsy benign.  Mild reactive/reparative change noted in ascending colon, transverse colon.  Descending colon with minimally increased intraepithelial lymphocytes in the background of mild reactive/reparative change, nonspecific, insufficient for diagnosis of microscopic colitis.  Sigmoid and rectum biopsy were benign.  Today:  Losing weight.  Eats supper only. May eat some crackers in the morning. Usually drinking alcohol in the morning.   Bright red blood per rectum for the last 3 days. Started after colonoscopy. May skip a few days without bleeding.   Mid abdominal pain that radiates to lower abdomen. Comes and goes. Not related to meals. Sometimes abdominal pain is worse prior to a BM and improved thereafter.   Continues with numerous BMs daily. States at least 10 a day. Small amounts. Usually loose/mushy. Having intermittent nocturnal stools.  Had the first formed stool last night that he has had in 1 month.  When walking a lot, he will have a BM.   Doesn't think he is taking imodium.    Ran out of nausea medications.  Taking Nexium 40 mg BID. No reflux symptoms. Wakes up with nausea sometimes. Occasional vomiting. May be post tussive.  When waking in the morning, will vomit the last bite of food that he ate.     Continues to have left rib pain.  Having burning with urination.  Pain overall worse recently.    Elevated LFTs:  Drinking 4-6 12 oz beer daily. Fireball, 2 shots daily.  6, 500 mg tylenol  daily.  No abdominal distention, lower extremity edema, yellowing of the eyes or skin, mental status changes.   Past Medical History:  Diagnosis Date   Alcohol abuse    Anxiety and depression    Asthma    Blood transfusion    Complication of anesthesia    patient  hemorrrhaged about 2 weeks after tonsillecotmy   COPD (chronic obstructive pulmonary disease) (HCC)    GERD (gastroesophageal reflux disease)    Hypertension    PTSD (post-traumatic stress disorder)    Stroke (HCC) 2021    Past Surgical History:  Procedure Laterality Date   APPENDECTOMY     BIOPSY  04/02/2020   Procedure: BIOPSY;  Surgeon: Lanelle Bal, DO;  [facility];  Service: Endoscopy;;   BIOPSY  03/18/2022   Procedure: BIOPSY;  Surgeon: Lanelle Bal, DO;  [facility];  Service: Endoscopy;;   BIOPSY  03/22/2023   Procedure: BIOPSY;  Surgeon: Lanelle Bal, DO;  [facility];  Service: Endoscopy;;   COLONOSCOPY WITH PROPOFOL N/A 04/02/2020   Procedure: COLONOSCOPY WITH PROPOFOL;  Surgeon: Lanelle Bal, DO;  [facility];  Service: Endoscopy;  Laterality: N/A;  8:30am   COLONOSCOPY WITH PROPOFOL N/A 03/18/2022   Surgeon: Lanelle Bal, DO; Nonbleeding internal hemorrhoids, multiple diverticula in the sigmoid colon, segmental area of moderately friable mucosa with spontaneous bleeding in the transverse and ascending colon biopsied. Pathology with no specific histopathologic change. Recommended 5 year surveillance.   COLONOSCOPY WITH PROPOFOL N/A 03/22/2023   Nonbleeding internal hemorrhoids, multiple sigmoid diverticula, normal TI s/p biopsy, localized area of mildly friable mucosa with contact bleeding in ascending colon and cecum.  Biopsies taken from entire colon.  TI biopsy benign.  Mild reactive/reparative change in in [facility] and TC.  Minimal increased intraepithelial lymphocytes and descending colon, nonspecific.  Sigmoid and rectum biopsy benign.   ESOPHAGOGASTRODUODENOSCOPY (EGD) WITH PROPOFOL N/A 04/02/2020   Procedure: ESOPHAGOGASTRODUODENOSCOPY (EGD) WITH PROPOFOL;  Surgeon: Lanelle Bal, DO;  [facility];  Service: Endoscopy;  Laterality: N/A;   ESOPHAGOGASTRODUODENOSCOPY (EGD) WITH PROPOFOL N/A  03/21/2021   Procedure: ESOPHAGOGASTRODUODENOSCOPY (EGD) WITH PROPOFOL;  Surgeon: Lanelle Bal, DO;  [facility];  Service: Endoscopy;  Laterality: N/A;  11:00am   ESOPHAGOGASTRODUODENOSCOPY (EGD) WITH PROPOFOL N/A 03/18/2022   3 cm hiatal hernia, short segment Barrett's biopsied, gastritis biopsied, duodenal scar/?  Residual polyp tissue that was resected.  Pathology with mild nonspecific reactive gastropathy, negative for H. 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  Surveillance in 5 years.   POLYPECTOMY  04/02/2020   Procedure: POLYPECTOMY;  Surgeon: Lanelle Bal, DO;  [facility];  Service: Endoscopy;;   POLYPECTOMY  03/21/2021   Procedure: POLYPECTOMY;  Surgeon: Lanelle Bal, DO;  [facility];  Service: Endoscopy;;  duodenal   POLYPECTOMY  03/18/2022   Procedure: POLYPECTOMY;  Surgeon: Lanelle Bal, DO;  [facility];  Service: Endoscopy;;   REPAIR EXTENSOR TENDON Left 08/06/2022   Procedure: LEFT LONG FINGER EXTENSOR TENDON CENTRALIZATION;  Surgeon: Betha Loa, MD;  Location: [facility];  Service: Orthopedics;  Laterality: Left;  60 MIN   STERIOD INJECTION Left 08/06/2022   Procedure: LEFT CARPAL TUNNEL INJECTION;  Surgeon: Betha Loa, MD;  Location: [facility];  Service: Orthopedics;  Laterality: Left;   TONSILLECTOMY      Current Outpatient Medications  Medication Sig Dispense Refill  albuterol (VENTOLIN HFA) 108 (90 Base) MCG/ACT inhaler Inhale 1 puff into the lungs every 4 (four) hours as needed for wheezing or shortness of breath. 18 g 1   atorvastatin (LIPITOR) 40 MG tablet Take 1 tablet (40 mg total) by mouth daily. 90 tablet 2   buPROPion (WELLBUTRIN) 75 MG tablet Take 2 tablets (150 mg total) by mouth 2 (two) times daily. 90 tablet 3   busPIRone (BUSPAR) 10 MG tablet Take 1 tablet (10 mg total) by mouth 2 (two) times daily. 180 tablet 3    celecoxib (CELEBREX) 200 MG capsule Take 200 mg by mouth daily.     cyanocobalamin 1000 MCG tablet Take 1 tablet (1,000 mcg total) by mouth daily. 90 tablet 1   diclofenac Sodium (VOLTAREN) 1 % GEL Apply 2 g topically 4 (four) times daily.     esomeprazole (NEXIUM) 40 MG capsule Take 1 capsule (40 mg total) by mouth 2 (two) times daily before a meal. 60 capsule 3   folic acid (FOLVITE) 1 MG tablet Take 1 tablet (1 mg total) by mouth daily. 90 tablet 3   hydrocortisone (ANUSOL-HC) 2.5 % rectal cream Place 1 Application rectally 2 (two) times daily. 30 g 1   lidocaine (LIDODERM) 5 % Place 1 patch onto the skin daily. Remove & Discard patch within 12 hours or as directed by MD 30 patch 0   loperamide (IMODIUM A-D) 2 MG tablet Take 2 tablets after first diarrhea type stool, then take 1 tablet after each additional loose stool up to a total of 4 tablets per day. 60 tablet 3   losartan (COZAAR) 50 MG tablet Take 1 tablet (50 mg total) by mouth daily. 90 tablet 3   Multiple Vitamin (MULTIVITAMIN WITH MINERALS) TABS tablet Take 1 tablet by mouth daily. 90 tablet 1   nicotine (NICODERM CQ - DOSED IN MG/24 HOURS) 21 mg/24hr patch Place 1 patch (21 mg total) onto the skin daily. 28 patch 0   ondansetron (ZOFRAN-ODT) 4 MG disintegrating tablet Take 1 tablet (4 mg total) by mouth every 8 (eight) hours as needed for nausea or vomiting. 20 tablet 0   thiamine (VITAMIN B-1) 100 MG tablet Take 1 tablet (100 mg total) by mouth daily. 90 tablet 3   tiotropium (SPIRIVA) 18 MCG inhalation capsule Place 1 capsule (18 mcg total) into inhaler and inhale daily. 30 capsule 12   traZODone (DESYREL) 100 MG tablet Take 1 tablet (100 mg total) by mouth at bedtime. 90 tablet 2   chlordiazePOXIDE (LIBRIUM) 10 MG capsule Take 1 capsule (10 mg total) by mouth See admin instructions. Take Librium (CHLORDIAZEPOXIDE HCL CAPS) Take 1 Capsule 3 times a day for 2 days, then take 1 cap 2 times a day for 2 days, then 1 cap daily x 2 days ,  then STOP----this will Help You Quit Drinking Alcohol.... - Please do Not drink alcohol while taking this medication (Patient not taking: Reported on 01/28/2023) 12 capsule 0   Potassium Chloride ER 20 MEQ TBCR Take 1 tablet (20 mEq total) by mouth daily for 5 days. 1 tab daily by mouth 5 tablet 0   No current facility-administered medications for this visit.    Allergies as of 04/29/2023 - Review Complete 04/29/2023  Allergen Reaction Noted   Bee venom Anaphylaxis 09/09/2011    Family History  Problem Relation Age of Onset   Colon cancer Neg Hx        Pt knows little about family history    Social History  Socioeconomic History   Marital status: Legally Separated    Spouse name: Not on file   Number of children: Not on file   Years of education: Not on file   Highest education level: Not on file  Occupational History   Not on file  Tobacco Use   Smoking status: Every Day    Current packs/day: 1.00    Average packs/day: 1 pack/day for 30.0 years (30.0 ttl pk-yrs)    Types: Cigars, Cigarettes   Smokeless tobacco: Never  Vaping Use   Vaping status: Never Used  Substance and Sexual Activity   Alcohol use: Yes    Alcohol/week: 2.0 standard drinks of alcohol    Types: 2 Cans of beer per week    Comment: 2-40oz beers per day and liquor; current 6-8 12 oz peer a day.   Drug use: No   Sexual activity: Not Currently  Other Topics Concern   Not on file  Social History Narrative   Right handed   Social Determinants of Health   Financial Resource Strain: Not on file  Food Insecurity: No Food Insecurity (01/11/2023)   Hunger Vital Sign    Worried About Running Out of Food in the Last Year: Never true    Ran Out of Food in the Last Year: Never true  Transportation Needs: No Transportation Needs (01/11/2023)   PRAPARE - Administrator, Civil Service (Medical): No    Lack of Transportation (Non-Medical): No  Physical Activity: Not on file  Stress: Not on file   Social Connections: Not on file    Review of Systems: Gen: Denies fever, chills, anorexia. Denies fatigue, weakness, weight loss.  CV: Denies chest pain, palpitations, syncope, peripheral edema, and claudication. Resp: Denies dyspnea at rest, cough, wheezing, coughing up blood, and pleurisy. GI: Denies vomiting blood, jaundice, and fecal incontinence.   Denies dysphagia or odynophagia. Derm: Denies rash, itching, dry skin Psych: Denies depression, anxiety, memory loss, confusion. No homicidal or suicidal ideation.  Heme: Denies bruising, bleeding, and enlarged lymph nodes.  Physical Exam: BP 137/72 (BP Location: Right Arm, Patient Position: Sitting, Cuff Size: Normal)   Pulse (!) 101   Temp 97.7 F (36.5 C) (Temporal)   Ht 5\' 8"  (1.727 m)   Wt 136 lb 6.4 oz (61.9 kg)   BMI 20.74 kg/m  General:   Alert and oriented. No distress noted. Pleasant and cooperative.  Head:  Normocephalic and atraumatic. Eyes:  Conjuctiva clear without scleral icterus. Heart:  S1, S2 present without murmurs appreciated. Lungs:  Clear to auscultation bilaterally. No wheezes, rales, or rhonchi. No distress.  Abdomen:  +BS, soft, non-tender and non-distended. No rebound or guarding. No HSM or masses noted. Msk:  Symmetrical without gross deformities. Normal posture. Extremities:  Without edema. Neurologic:  Alert and  oriented x4 Psych:  Normal mood and affect.    Assessment:  57 y.o. male with history of GERD, Barrett's esophagus, duodenal tubular adenoma, adenomatous colon polyps, hemorrhoids s/p banding x 3 in 2023, elevated LFTs, alcohol abuse, chronic diarrhea, colitis in July 2024, presenting today for follow-up s/p colonoscopy.   Generalized abdominal pain: Etiology unclear. With multiple symptoms including diarrhea, rectal bleeding, nausea, dysuria, along with chronic daily alcohol use, differential includes UTI, alcoholic hepatitis, colitis, pancreatitis. Planning to update labs and arrange CT  for further evaluation.   Rectal bleeding Recurrent rectal bleeding since recent colonoscopy. Likely related to hemorrhoids as he has had similar symptoms in the past that improved with banding and recent  colonoscopy did show presence of internal hemorrhoids.  Unable to rule out bleeding from friable colonic mucosa that was also noted on recent colonoscopy though pathology essentially benign.  For now, I will start him on Anusol rectal cream twice daily and try to slow his diarrhea down.  Will also update CBC.  Can consider hemorrhoid banding if bleeding doesn't improve.   History of colitis/abnormal colonoscopy/chronic diarrhea:  CT in July with mild diffuse thickened appearance of the colon concerning for colitis.  Etiology is unknown.  He was treated empirically with antibiotics during admission in July with some improvement in his chronic diarrhea, but significant diarrhea continues with upwards of 10 small Bms daily, occasional nocturnal stools. Also reporting some rectal bleeding, but this may very well be related to hemorrhoids. Stool studies including C. difficile, GI pathogen panel, Cryptosporidium, Giardia have been negative.  Colonoscopy September 2024 with localized area of mildly friable mucosa with contact bleeding in the ascending colon and cecum.  Pathology with normal TI, mild reactive/reparative change in [facility] and TC, minimally increased intraepithelial lymphocytes in the background of mild reactive/reparative change in DC, sigmoid and rectal biopsy were benign. Notably, patient was also found to have friable mucosa in the transverse and ascending colon in September 2023 with no specific histopathologic change on pathology.  Unclear if patients chronic diarrhea is related to findings noted on colonoscopy though pathology is not showing any specific disease state. It is possible findings are secondary to alcohol abuse which may also be contributing to his chronic diarrhea. Additional testing  for diarrhea has included 5 HIAA, celiac screen, TSH all wnl.  He may very well have miltifactorial disease. IBS may be playing a role here as well.  Case discussed with Dr. Marletta Lor. Will have him use imodium for now. Could consider trial of budesonide if not improved with imodium.  Ultimately needs to stop drinking alcohol as this is likely a driving factor of mucosal inflammation. Symptoms are not consistent with ischemic colitis as he has no postprandial symptoms and pattern of inflammation isn't consistent with this.   Nausea/vomiting:  Chronic intermittent morning nausea/vomiting. Possibly secondary to alcohol abuse/withdrawal in the morning/post tussive emesis, nocturnal GERD. Typical daytime GERD well controlled on PPI BID. Could have a component of gastroparesis as he is reporting vomiting the last bite of what he ate the night prior.  Less likely significant upper GI pathology considering chronicity and lack of daytime symptoms. EGD September 2023 with hiatal hernia, short segment Barrett's, residual duodenal polyp tissue resected showing duodenal adenoma with low-grade dysplasia, also with nonspecific reactive gastropathy. Could consider upper GI series vs GES pending CT for abdominal pain.   Weight loss:  Likely related to poor oral intake in the setting of alcohol abuse.   GERD:  Well controlled on Nexium BID.   Elevated LFTs:  Chronic. Likely secondary to alcohol abuse and daily tylenol, but will complete additional work-up for completeness.  Most recent labs 04/07/2023 with AST 254, ALT 84, alk phos 290, total bilirubin 1.0.  No cirrhosis on recent abdominal imaging and no symptoms of decompensated liver disease. He was counseled today on the importance of alcohol abstinence and limiting tylenol.  We discussed rehab, group meetings such as AA, etc. to help with alcohol cessation, but patient states he is not doing this.  He is asking about medication to help with alcohol cessation.  Advised  he can discuss this with his primary care doctor.  Left rib pain:  Chronic.  Likely secondary  to old rib fracture.  Prior chest x-ray for the same symptoms in July 2024 showing old rib fractures.  He is asking for pain medications.  He was advised to discuss pain control with his primary care doctor.  Also discussed that he would likely not be prescribed any opioid medications in the setting of chronic alcohol abuse.  I did prescribe him a lidocaine patch today as he is currently taking large amounts of tylenol, but further refills will need to come from his primary care doctor if they feel this is appropriate for long term management.     Plan:  CT A/P with contrast ASAP CBC, CMP, urine analysis, fecal elastase, iron panel, ANA, AMA, ASMA, immunoglobulins, ceruloplasmin, alpha-1 antitrypsin, hepatitis A antibody total, hepatitis B surface antibody, hepatitis B core antibody, hepatitis B surface antigen, hepatitis C antibody. Start Anusol rectal cream 2-3 times daily.  Consider hemorrhoid banding if rectal bleeding continues. Use Imodium as needed for diarrhea.  Advised to take 2 Imodium after the first diarrhea type stool, then 1 additional Imodium after each additional loose stool up to a total of 4 Imodium per day. Lidoderm patch daily as needed for left rib pain.  Continue Nexium 40 mg twice daily. 4-6 small meals daily. 1-2 protein shakes daily. Do not eat within 30 minutes of going to bed. Need strict alcohol avoidance. Advised patient to discuss medications to assist with alcohol cessation with PCP. Limit Tylenol is much as possible.  No more than 2000 mg/day.  [name], PA-C [facility] 04/29/2023

## 2023-04-29 ENCOUNTER — Encounter: Payer: Self-pay | Admitting: Gastroenterology

## 2023-04-29 ENCOUNTER — Ambulatory Visit (INDEPENDENT_AMBULATORY_CARE_PROVIDER_SITE_OTHER): Payer: MEDICAID | Admitting: Gastroenterology

## 2023-04-29 ENCOUNTER — Encounter: Payer: Self-pay | Admitting: *Deleted

## 2023-04-29 ENCOUNTER — Telehealth: Payer: Self-pay | Admitting: *Deleted

## 2023-04-29 VITALS — BP 137/72 | HR 101 | Temp 97.7°F | Ht 68.0 in | Wt 136.4 lb

## 2023-04-29 DIAGNOSIS — K529 Noninfective gastroenteritis and colitis, unspecified: Secondary | ICD-10-CM

## 2023-04-29 DIAGNOSIS — R112 Nausea with vomiting, unspecified: Secondary | ICD-10-CM | POA: Diagnosis not present

## 2023-04-29 DIAGNOSIS — R0781 Pleurodynia: Secondary | ICD-10-CM

## 2023-04-29 DIAGNOSIS — R300 Dysuria: Secondary | ICD-10-CM

## 2023-04-29 DIAGNOSIS — R1084 Generalized abdominal pain: Secondary | ICD-10-CM

## 2023-04-29 DIAGNOSIS — K625 Hemorrhage of anus and rectum: Secondary | ICD-10-CM

## 2023-04-29 DIAGNOSIS — R7989 Other specified abnormal findings of blood chemistry: Secondary | ICD-10-CM

## 2023-04-29 DIAGNOSIS — K219 Gastro-esophageal reflux disease without esophagitis: Secondary | ICD-10-CM

## 2023-04-29 DIAGNOSIS — R197 Diarrhea, unspecified: Secondary | ICD-10-CM

## 2023-04-29 DIAGNOSIS — F1010 Alcohol abuse, uncomplicated: Secondary | ICD-10-CM

## 2023-04-29 DIAGNOSIS — K648 Other hemorrhoids: Secondary | ICD-10-CM

## 2023-04-29 DIAGNOSIS — Z8719 Personal history of other diseases of the digestive system: Secondary | ICD-10-CM

## 2023-04-29 DIAGNOSIS — R634 Abnormal weight loss: Secondary | ICD-10-CM

## 2023-04-29 MED ORDER — LOPERAMIDE HCL 2 MG PO TABS
ORAL_TABLET | ORAL | 3 refills | Status: DC
Start: 2023-04-29 — End: 2023-11-02

## 2023-04-29 MED ORDER — HYDROCORTISONE (PERIANAL) 2.5 % EX CREA
1.0000 | TOPICAL_CREAM | Freq: Two times a day (BID) | CUTANEOUS | 1 refills | Status: DC
Start: 2023-04-29 — End: 2023-11-02

## 2023-04-29 MED ORDER — LIDOCAINE 5 % EX PTCH
1.0000 | MEDICATED_PATCH | CUTANEOUS | 0 refills | Status: DC
Start: 2023-04-29 — End: 2024-02-02

## 2023-04-29 NOTE — Telephone Encounter (Signed)
Authorization Number: [id number] Case Number: [id number] DOS:  04/29/2023-05/29/2023   CT scheduled for 10/25, arrival 12:15pm to start drinking oral contrast.  Called pt and he was agreeable to appointment but stated he can arrive at 12:30pm. I advised him the arrival time is 12:15pm. Baxter Hire wants him to drink the oral contrast and have IV contrast as well.

## 2023-04-29 NOTE — Patient Instructions (Addendum)
We will arrange to have a CT scan of your abdomen and pelvis at [facility].  Please also have labs completed when you go to [facility].   Continue taking Nexium 40 mg twice daily 30 minutes before breakfast and 30 minutes before dinner.  Do not eat within 30 minutes of laying down.  You should be eating 4-6 small meals daily to help maintain your weight .   Also recommend that you try drinking 1-2 protein shakes daily to help with weight gain.  As we discussed, you need to stop drinking alcohol completely.  You can discuss medications to help you with this with Dr. Sherwood Gambler at your upcoming appointment on Monday.  Limit Tylenol as much as possible.  Do not exceed more than 2000 mg/day.  For rectal bleeding, use Anusol rectal cream 2-3 times daily to treat hemorrhoids.   Limit toilet time to 2-3 minutes.  I am going to discuss her case further with Dr. Marletta Lor and will call you with further recommendations regarding management of diarrhea.   I am also sending in a lidocaine patch that you can apply to your left rib cage to see if this will help relieve some of the pain.  You should also discuss this further with Dr. Sherwood Gambler.   [name], PA-C [facility]

## 2023-04-30 ENCOUNTER — Other Ambulatory Visit (HOSPITAL_COMMUNITY)
Admission: RE | Admit: 2023-04-30 | Discharge: 2023-04-30 | Disposition: A | Discharge status: Home / Self Care | Payer: MEDICAID | Source: Ambulatory Visit | Attending: Gastroenterology | Admitting: Gastroenterology

## 2023-04-30 ENCOUNTER — Encounter: Payer: Self-pay | Admitting: Gastroenterology

## 2023-04-30 ENCOUNTER — Ambulatory Visit (HOSPITAL_COMMUNITY): Payer: MEDICAID

## 2023-04-30 DIAGNOSIS — K625 Hemorrhage of anus and rectum: Secondary | ICD-10-CM | POA: Diagnosis not present

## 2023-04-30 DIAGNOSIS — R197 Diarrhea, unspecified: Secondary | ICD-10-CM | POA: Insufficient documentation

## 2023-04-30 DIAGNOSIS — R300 Dysuria: Secondary | ICD-10-CM | POA: Insufficient documentation

## 2023-04-30 DIAGNOSIS — R7989 Other specified abnormal findings of blood chemistry: Secondary | ICD-10-CM | POA: Insufficient documentation

## 2023-04-30 LAB — CBC WITH DIFFERENTIAL/PLATELET
Abs Immature Granulocytes: 0.02 10*3/uL (ref 0.00–0.07)
Basophils Absolute: 0 10*3/uL (ref 0.0–0.1)
Basophils Relative: 1 %
Eosinophils Absolute: 0 10*3/uL (ref 0.0–0.5)
Eosinophils Relative: 0 %
HCT: 30.6 % — ABNORMAL LOW (ref 39.0–52.0)
Hemoglobin: 10.6 g/dL — ABNORMAL LOW (ref 13.0–17.0)
Immature Granulocytes: 0 %
Lymphocytes Relative: 24 %
Lymphs Abs: 1.1 10*3/uL (ref 0.7–4.0)
MCH: 39.1 pg — ABNORMAL HIGH (ref 26.0–34.0)
MCHC: 34.6 g/dL (ref 30.0–36.0)
MCV: 112.9 fL — ABNORMAL HIGH (ref 80.0–100.0)
Monocytes Absolute: 0.5 10*3/uL (ref 0.1–1.0)
Monocytes Relative: 11 %
Neutro Abs: 3.1 10*3/uL (ref 1.7–7.7)
Neutrophils Relative %: 64 %
Platelets: 164 10*3/uL (ref 150–400)
RBC: 2.71 MIL/uL — ABNORMAL LOW (ref 4.22–5.81)
RDW: 13.9 % (ref 11.5–15.5)
WBC: 4.8 10*3/uL (ref 4.0–10.5)
nRBC: 0 % (ref 0.0–0.2)

## 2023-04-30 LAB — URINALYSIS, COMPLETE (UACMP) WITH MICROSCOPIC
Bacteria, UA: NONE SEEN
Bilirubin Urine: NEGATIVE
Glucose, UA: NEGATIVE mg/dL
Hgb urine dipstick: NEGATIVE
Ketones, ur: NEGATIVE mg/dL
Leukocytes,Ua: NEGATIVE
Nitrite: NEGATIVE
Protein, ur: NEGATIVE mg/dL
Specific Gravity, Urine: 1.01 (ref 1.005–1.030)
pH: 5 (ref 5.0–8.0)

## 2023-04-30 LAB — COMPREHENSIVE METABOLIC PANEL
ALT: 70 U/L — ABNORMAL HIGH (ref 0–44)
AST: 270 U/L — ABNORMAL HIGH (ref 15–41)
Albumin: 3.9 g/dL (ref 3.5–5.0)
Alkaline Phosphatase: 369 U/L — ABNORMAL HIGH (ref 38–126)
Anion gap: 15 (ref 5–15)
BUN: 10 mg/dL (ref 6–20)
CO2: 19 mmol/L — ABNORMAL LOW (ref 22–32)
Calcium: 8.8 mg/dL — ABNORMAL LOW (ref 8.9–10.3)
Chloride: 95 mmol/L — ABNORMAL LOW (ref 98–111)
Creatinine, Ser: 1.14 mg/dL (ref 0.61–1.24)
GFR, Estimated: >60 mL/min (ref >60)
Glucose, Bld: 102 mg/dL — ABNORMAL HIGH (ref 70–99)
Potassium: 4 mmol/L (ref 3.5–5.1)
Sodium: 129 mmol/L — ABNORMAL LOW (ref 135–145)
Total Bilirubin: 1.8 mg/dL — ABNORMAL HIGH (ref 0.3–1.2)
Total Protein: 8.6 g/dL — ABNORMAL HIGH (ref 6.5–8.1)

## 2023-04-30 LAB — IRON AND TIBC
Iron: 134 ug/dL (ref 45–182)
Saturation Ratios: 34 % (ref 17.9–39.5)
TIBC: 396 ug/dL (ref 250–450)
UIBC: 262 ug/dL

## 2023-04-30 LAB — HEPATITIS B SURFACE ANTIGEN: Hepatitis B Surface Ag: NONREACTIVE

## 2023-04-30 LAB — FERRITIN: Ferritin: 107 ng/mL (ref 24–336)

## 2023-04-30 LAB — HEPATITIS B SURFACE ANTIBODY,QUALITATIVE: Hep B S Ab: NONREACTIVE

## 2023-04-30 LAB — HEPATITIS C ANTIBODY: HCV Ab: NONREACTIVE

## 2023-04-30 LAB — HEPATITIS B CORE ANTIBODY, TOTAL: Hep B Core Total Ab: NONREACTIVE

## 2023-04-30 LAB — HEPATITIS A ANTIBODY, TOTAL: hep A Total Ab: REACTIVE — AB

## 2023-05-01 LAB — CERULOPLASMIN: Ceruloplasmin: 26.5 mg/dL (ref 16.0–31.0)

## 2023-05-01 LAB — IGG, IGA, IGM
IgA: 652 mg/dL — ABNORMAL HIGH (ref 90–386)
IgG (Immunoglobin G), Serum: 1491 mg/dL (ref 603–1613)
IgM (Immunoglobulin M), Srm: 117 mg/dL (ref 20–172)

## 2023-05-02 LAB — ANTI-SMOOTH MUSCLE ANTIBODY, IGG: F-Actin IgG: 14 U (ref 0–19)

## 2023-05-02 LAB — ANA: Anti Nuclear Antibody (ANA): NEGATIVE

## 2023-05-02 LAB — MITOCHONDRIAL ANTIBODIES: Mitochondrial M2 Ab, IgG: <20 U (ref 0.0–20.0)

## 2023-05-03 ENCOUNTER — Other Ambulatory Visit (HOSPITAL_COMMUNITY): Payer: Self-pay | Admitting: Internal Medicine

## 2023-05-03 DIAGNOSIS — R1010 Upper abdominal pain, unspecified: Secondary | ICD-10-CM

## 2023-05-04 ENCOUNTER — Telehealth: Payer: Self-pay | Admitting: *Deleted

## 2023-05-04 NOTE — Telephone Encounter (Signed)
Received approval letter for lidocaine. Scan letter into chart.

## 2023-05-05 LAB — ALPHA-1-ANTITRYPSIN PHENOTYP: A-1 Antitrypsin, Ser: 205 mg/dL — ABNORMAL HIGH (ref 101–187)

## 2023-05-07 ENCOUNTER — Telehealth: Payer: Self-pay | Admitting: *Deleted

## 2023-05-07 NOTE — Telephone Encounter (Signed)
Pt called and said that he has to have a CT scan for Dr. Sherwood Gambler on Thursday and wants to know if he needs more blood work.

## 2023-05-09 NOTE — Telephone Encounter (Signed)
He needs to submit the stool sample in the cup they gave him at the lab. We can also recheck an HFP to see where we are with his liver enzymes as they have been steady increasing recently. Please plan an order for this. Dx: Elevated LFTs

## 2023-05-11 ENCOUNTER — Other Ambulatory Visit: Payer: Self-pay | Admitting: *Deleted

## 2023-05-11 DIAGNOSIS — R7989 Other specified abnormal findings of blood chemistry: Secondary | ICD-10-CM

## 2023-05-11 NOTE — Telephone Encounter (Signed)
Spoke to pt, informed him to have labs done when he goes for CT on Thursday. He voiced understanding. Labs entered into Epic.

## 2023-05-13 ENCOUNTER — Ambulatory Visit (HOSPITAL_COMMUNITY)
Admission: RE | Admit: 2023-05-13 | Discharge: 2023-05-13 | Disposition: A | Discharge status: Home / Self Care | Payer: MEDICAID | Source: Ambulatory Visit | Attending: Internal Medicine | Admitting: Internal Medicine

## 2023-05-13 ENCOUNTER — Other Ambulatory Visit (HOSPITAL_COMMUNITY)
Admission: RE | Admit: 2023-05-13 | Discharge: 2023-05-13 | Disposition: A | Discharge status: Home / Self Care | Payer: MEDICAID | Source: Ambulatory Visit | Attending: Gastroenterology | Admitting: Gastroenterology

## 2023-05-13 DIAGNOSIS — R1010 Upper abdominal pain, unspecified: Secondary | ICD-10-CM | POA: Insufficient documentation

## 2023-05-13 LAB — HEPATIC FUNCTION PANEL
ALT: 44 U/L (ref 0–44)
AST: 116 U/L — ABNORMAL HIGH (ref 15–41)
Albumin: 3 g/dL — ABNORMAL LOW (ref 3.5–5.0)
Alkaline Phosphatase: 250 U/L — ABNORMAL HIGH (ref 38–126)
Bilirubin, Direct: 0.9 mg/dL — ABNORMAL HIGH (ref 0.0–0.2)
Indirect Bilirubin: 1.1 mg/dL — ABNORMAL HIGH (ref 0.3–0.9)
Total Bilirubin: 2 mg/dL — ABNORMAL HIGH (ref <1.2)
Total Protein: 6.9 g/dL (ref 6.5–8.1)

## 2023-05-13 MED ORDER — IOHEXOL 300 MG/ML  SOLN
100.0000 mL | Freq: Once | INTRAMUSCULAR | Status: AC | PRN
  Administered 2023-05-13: 100 mL via INTRAVENOUS

## 2023-05-20 ENCOUNTER — Telehealth: Payer: Self-pay | Admitting: Internal Medicine

## 2023-05-20 ENCOUNTER — Encounter: Payer: Self-pay | Admitting: *Deleted

## 2023-05-20 DIAGNOSIS — R112 Nausea with vomiting, unspecified: Secondary | ICD-10-CM

## 2023-05-20 NOTE — Telephone Encounter (Signed)
He should be able to ask the lab for a hat to collect the stool. He will have to use something to scoop up the stool and place it in the container. He can ask the lab if they have something for this as well or what they would recommend. He could use a disposable spoon etc if needed.

## 2023-05-20 NOTE — Telephone Encounter (Signed)
Not necessarily. This is chronic issue and was unchanged at his recent OV. I reviewed his CT he completed 05/13/23. No acute findings or any significant GI abnormalities on this CT. He has history of left rib fractures which is likely the cause of his chronic pain which we discussed at his last OV.   We can pursue a GES to evaluate his morning nausea and vomiting as recommended at his OV following CT.   He needs to stop drinking alcohol. Did he discuss this with his PCP? He was asking me about medications to help with this.   He also still need to complete fecal elastase to help evaluate diarrhea as previously ordered.    Otherwise, we can schedule a follow-up with Dr. Marletta Lor in 2-4 weeks to see how he is doing.

## 2023-05-20 NOTE — Telephone Encounter (Signed)
Spoke to pt, informed him to go to labs and ask for a hat to collet stools. Pt voiced understanding.

## 2023-05-20 NOTE — Telephone Encounter (Signed)
Called pt and made aware of GES appt 11/18, arrival 8:45am, npo midnight and no stomach meds after midnight. He voiced understanding and I did have patient repeat this back to me.

## 2023-05-20 NOTE — Telephone Encounter (Signed)
Patient has been seen recently by [facility] and I received a referral from Dr Sherwood Gambler this morning on patient having left sided pain. Do we need to bring him back in for another OV?

## 2023-05-20 NOTE — Telephone Encounter (Signed)
Spoke to pt, informed him of recommendations. He voiced understanding. Pt states he would be will to have GES done. He states can't do the fecal elastase, because he can't get the poop in the little container. Sending a MyChart message so his cousin (DPR) can read it.

## 2023-05-24 ENCOUNTER — Encounter (HOSPITAL_COMMUNITY)
Admission: RE | Admit: 2023-05-24 | Discharge: 2023-05-24 | Disposition: A | Discharge status: Home / Self Care | Payer: MEDICAID | Source: Ambulatory Visit | Attending: Gastroenterology | Admitting: Gastroenterology

## 2023-05-24 DIAGNOSIS — R112 Nausea with vomiting, unspecified: Secondary | ICD-10-CM | POA: Insufficient documentation

## 2023-05-24 MED ORDER — TECHNETIUM TC 99M SULFUR COLLOID
1.9000 | Freq: Once | INTRAVENOUS | Status: AC | PRN
  Administered 2023-05-24: 1.9 via ORAL

## 2023-05-24 NOTE — Telephone Encounter (Signed)
I called and left a message asking Bruce to call us back to scehdule

## 2023-06-11 LAB — COMPREHENSIVE METABOLIC PANEL
ALT: 73 [IU]/L — ABNORMAL HIGH (ref 0–44)
AST: 83 [IU]/L — ABNORMAL HIGH (ref 0–40)
Albumin: 4.6 g/dL (ref 3.8–4.9)
Alkaline Phosphatase: 96 [IU]/L (ref 44–121)
BUN/Creatinine Ratio: 6 — ABNORMAL LOW (ref 9–20)
BUN: 7 mg/dL (ref 6–24)
Bilirubin Total: 0.4 mg/dL (ref 0.0–1.2)
Calcium: 9.5 mg/dL (ref 8.7–10.2)
Chloride: 89 mmol/L — ABNORMAL LOW (ref 96–106)
Creatinine, Ser: 1.14 mg/dL (ref 0.76–1.27)
Globulin, Total: 2.9 g/dL (ref 1.5–4.5)
Glucose: 98 mg/dL (ref 70–99)
Potassium: 4.5 mmol/L (ref 3.5–5.2)
Sodium: 122 mmol/L — ABNORMAL LOW (ref 134–144)
Total Protein: 7.5 g/dL (ref 6.0–8.5)
eGFR: 75 mL/min/{1.73_m2} (ref >59)

## 2023-06-11 LAB — SPECIMEN STATUS REPORT

## 2023-06-15 ENCOUNTER — Telehealth: Payer: Self-pay | Admitting: *Deleted

## 2023-06-15 NOTE — Telephone Encounter (Signed)
Patient left VM about his appointment tomorrow. Please call patient.

## 2023-06-16 ENCOUNTER — Ambulatory Visit: Payer: MEDICAID | Admitting: Internal Medicine

## 2023-07-29 ENCOUNTER — Ambulatory Visit: Payer: MEDICAID | Admitting: Internal Medicine

## 2023-09-15 ENCOUNTER — Encounter: Payer: Self-pay | Admitting: *Deleted

## 2023-09-20 ENCOUNTER — Inpatient Hospital Stay: Payer: MEDICAID

## 2023-09-20 ENCOUNTER — Encounter: Payer: Self-pay | Admitting: Oncology

## 2023-09-20 ENCOUNTER — Inpatient Hospital Stay: Payer: MEDICAID | Attending: Oncology | Admitting: Oncology

## 2023-09-20 VITALS — BP 111/60 | HR 95 | Temp 97.9°F | Resp 16 | Wt 133.8 lb

## 2023-09-20 DIAGNOSIS — D61818 Other pancytopenia: Secondary | ICD-10-CM

## 2023-09-20 DIAGNOSIS — I10 Essential (primary) hypertension: Secondary | ICD-10-CM | POA: Diagnosis not present

## 2023-09-20 DIAGNOSIS — R61 Generalized hyperhidrosis: Secondary | ICD-10-CM | POA: Diagnosis not present

## 2023-09-20 DIAGNOSIS — F10129 Alcohol abuse with intoxication, unspecified: Secondary | ICD-10-CM | POA: Diagnosis not present

## 2023-09-20 DIAGNOSIS — Z79899 Other long term (current) drug therapy: Secondary | ICD-10-CM | POA: Insufficient documentation

## 2023-09-20 DIAGNOSIS — R634 Abnormal weight loss: Secondary | ICD-10-CM | POA: Insufficient documentation

## 2023-09-20 DIAGNOSIS — Z791 Long term (current) use of non-steroidal anti-inflammatories (NSAID): Secondary | ICD-10-CM | POA: Diagnosis not present

## 2023-09-20 DIAGNOSIS — J449 Chronic obstructive pulmonary disease, unspecified: Secondary | ICD-10-CM | POA: Insufficient documentation

## 2023-09-20 DIAGNOSIS — Z87891 Personal history of nicotine dependence: Secondary | ICD-10-CM | POA: Insufficient documentation

## 2023-09-20 DIAGNOSIS — F1010 Alcohol abuse, uncomplicated: Secondary | ICD-10-CM | POA: Insufficient documentation

## 2023-09-20 LAB — CBC WITH DIFFERENTIAL/PLATELET
Abs Immature Granulocytes: 0 10*3/uL (ref 0.00–0.07)
Basophils Absolute: 0 10*3/uL (ref 0.0–0.1)
Basophils Relative: 1 %
Eosinophils Absolute: 0 10*3/uL (ref 0.0–0.5)
Eosinophils Relative: 0 %
HCT: 31.7 % — ABNORMAL LOW (ref 39.0–52.0)
Hemoglobin: 10.9 g/dL — ABNORMAL LOW (ref 13.0–17.0)
Immature Granulocytes: 0 %
Lymphocytes Relative: 30 %
Lymphs Abs: 1.1 10*3/uL (ref 0.7–4.0)
MCH: 37.6 pg — ABNORMAL HIGH (ref 26.0–34.0)
MCHC: 34.4 g/dL (ref 30.0–36.0)
MCV: 109.3 fL — ABNORMAL HIGH (ref 80.0–100.0)
Monocytes Absolute: 0.3 10*3/uL (ref 0.1–1.0)
Monocytes Relative: 9 %
Neutro Abs: 2.2 10*3/uL (ref 1.7–7.7)
Neutrophils Relative %: 60 %
Platelets: 151 10*3/uL (ref 150–400)
RBC: 2.9 MIL/uL — ABNORMAL LOW (ref 4.22–5.81)
RDW: 14.5 % (ref 11.5–15.5)
WBC: 3.6 10*3/uL — ABNORMAL LOW (ref 4.0–10.5)
nRBC: 0 % (ref 0.0–0.2)

## 2023-09-20 LAB — FERRITIN: Ferritin: 43 ng/mL (ref 24–336)

## 2023-09-20 LAB — VITAMIN B12: Vitamin B-12: 662 pg/mL (ref 180–914)

## 2023-09-20 LAB — FOLATE: Folate: 35.1 ng/mL (ref >5.9)

## 2023-09-20 LAB — IRON AND TIBC
Iron: 64 ug/dL (ref 45–182)
Saturation Ratios: 15 % — ABNORMAL LOW (ref 17.9–39.5)
TIBC: 416 ug/dL (ref 250–450)
UIBC: 352 ug/dL

## 2023-09-20 LAB — URIC ACID: Uric Acid, Serum: 3.4 mg/dL — ABNORMAL LOW (ref 3.7–8.6)

## 2023-09-20 LAB — LACTATE DEHYDROGENASE: LDH: 266 U/L — ABNORMAL HIGH (ref 98–192)

## 2023-09-20 NOTE — Assessment & Plan Note (Signed)
 Patient is referred for pancytopenia.  Differential includes medication, alcohol, nutritional deficiencies, and liver dysfunction. -Leukopenia-chronic.  On chart review has lasted since 2021 at least.  Likely secondary to alcohol use.  Will obtain on ultrasound of abdomen to rule out liver cirrhosis.  Will also send a flow cytometry -Anemia: Macrocytic anemia likely secondary to alcohol use or nutritional deficiencies.  Will obtain nutritional labs today. -Thrombocytopenia: Likely secondary to alcohol use or may be liver cirrhosis.  Will obtain an ultrasound of abdomen to rule out liver cirrhosis.  -If all of the above workup comes back negative, will consider bone marrow biopsy. Return to clinic in 2 weeks with all the above results.

## 2023-09-20 NOTE — Progress Notes (Signed)
 [facility] at [facility]  HEMATOLOGY NEW VISIT  Elfredia Nevins, MD  REASON FOR REFERRAL: Pancytopenia  HISTORY OF PRESENT ILLNESS:  Gabriel Koch 58 y.o. male referred for pancytopenia.  Patient has a history of chronic alcohol use, hypertension, COPD, depression.  Today he appears visibly intoxicated with alcohol.  He has some spontaneous bruising on his arms and legs. He reports that the bruises 'come and go' since the past couple of months.The patient also reports generalized pain, for which he takes four to six Tylenol daily. He has experienced unintentional weight loss and decreased appetite over the past several months. He also reports night sweats severe enough to require changing clothes.  The patient has a history of alcohol use, currently consuming three beers daily, and has been drinking since the age of [age]. He also reports a history of tobacco use, starting at a young age.  The patient also reports tremors, which have been present since his history of electrocution. He has been experiencing both diarrhea and constipation. He has a surgical history of appendectomy and tonsillectomy.    I have reviewed the past medical history, past surgical history, social history and family history with the patient   ALLERGIES:  is allergic to bee venom.  MEDICATIONS:  Current Outpatient Medications  Medication Sig Dispense Refill   albuterol (VENTOLIN HFA) 108 (90 Base) MCG/ACT inhaler Inhale 1 puff into the lungs every 4 (four) hours as needed for wheezing or shortness of breath. 18 g 1   atorvastatin (LIPITOR) 40 MG tablet Take 1 tablet (40 mg total) by mouth daily. 90 tablet 2   buPROPion (WELLBUTRIN) 75 MG tablet Take 2 tablets (150 mg total) by mouth 2 (two) times daily. 90 tablet 3   busPIRone (BUSPAR) 10 MG tablet Take 1 tablet (10 mg total) by mouth 2 (two) times daily. 180 tablet 3   celecoxib (CELEBREX) 200 MG capsule Take 200 mg by mouth daily.      chlordiazePOXIDE (LIBRIUM) 10 MG capsule Take 1 capsule (10 mg total) by mouth See admin instructions. Take Librium (CHLORDIAZEPOXIDE HCL CAPS) Take 1 Capsule 3 times a day for 2 days, then take 1 cap 2 times a day for 2 days, then 1 cap daily x 2 days , then STOP----this will Help You Quit Drinking Alcohol.... - Please do Not drink alcohol while taking this medication 12 capsule 0   cyanocobalamin 1000 MCG tablet Take 1 tablet (1,000 mcg total) by mouth daily. 90 tablet 1   diclofenac Sodium (VOLTAREN) 1 % GEL Apply 2 g topically 4 (four) times daily.     esomeprazole (NEXIUM) 40 MG capsule Take 1 capsule (40 mg total) by mouth 2 (two) times daily before a meal. 60 capsule 3   folic acid (FOLVITE) 1 MG tablet Take 1 tablet (1 mg total) by mouth daily. 90 tablet 3   hydrocortisone (ANUSOL-HC) 2.5 % rectal cream Place 1 Application rectally 2 (two) times daily. 30 g 1   lidocaine (LIDODERM) 5 % Place 1 patch onto the skin daily. Remove & Discard patch within 12 hours or as directed by MD 30 patch 0   loperamide (IMODIUM A-D) 2 MG tablet Take 2 tablets after first diarrhea type stool, then take 1 tablet after each additional loose stool up to a total of 4 tablets per day. 60 tablet 3   losartan (COZAAR) 50 MG tablet Take 1 tablet (50 mg total) by mouth daily. 90 tablet 3   Multiple Vitamin (  MULTIVITAMIN WITH MINERALS) TABS tablet Take 1 tablet by mouth daily. 90 tablet 1   nicotine (NICODERM CQ - DOSED IN MG/24 HOURS) 21 mg/24hr patch Place 1 patch (21 mg total) onto the skin daily. 28 patch 0   ondansetron (ZOFRAN-ODT) 4 MG disintegrating tablet Take 1 tablet (4 mg total) by mouth every 8 (eight) hours as needed for nausea or vomiting. 20 tablet 0   thiamine (VITAMIN B-1) 100 MG tablet Take 1 tablet (100 mg total) by mouth daily. 90 tablet 3   tiotropium (SPIRIVA) 18 MCG inhalation capsule Place 1 capsule (18 mcg total) into inhaler and inhale daily. 30 capsule 12   traZODone (DESYREL) 100 MG  tablet Take 1 tablet (100 mg total) by mouth at bedtime. 90 tablet 2   Potassium Chloride ER 20 MEQ TBCR Take 1 tablet (20 mEq total) by mouth daily for 5 days. 1 tab daily by mouth 5 tablet 0   No current facility-administered medications for this visit.     REVIEW OF SYSTEMS:   Constitutional: Denies fevers, chills or night sweats Eyes: Denies blurriness of vision Ears, nose, mouth, throat, and face: Denies mucositis or sore throat Respiratory: Denies cough, dyspnea or wheezes Cardiovascular: Denies palpitation, chest discomfort or lower extremity swelling Gastrointestinal:  Denies nausea, heartburn or change in bowel habits Skin: Denies abnormal skin rashes Lymphatics: Denies new lymphadenopathy or easy bruising Neurological:Denies numbness, tingling or new weaknesses Behavioral/Psych: Mood is stable, no new changes  All other systems were reviewed with the patient and are negative.  PHYSICAL EXAMINATION:   Vitals:   09/20/23 1258  BP: 111/60  Pulse: 95  Resp: 16  Temp: 97.9 F (36.6 C)  SpO2: 100%    GENERAL:alert, no distress and comfortable LYMPH:  no palpable lymphadenopathy in the cervical, axillary or inguinal LUNGS: clear to auscultation and percussion with normal breathing effort HEART: regular rate & rhythm and no murmurs and no lower extremity edema ABDOMEN:abdomen soft, non-tender and normal bowel sounds Musculoskeletal:no cyanosis of digits and no clubbing  NEURO: alert & oriented x 3 with stuttering speech, staggering gait.   LABORATORY DATA:  I have reviewed the data as listed and labs under media by primary care Labs from 09/14/2023: CBC: WBC: 3.2, hemoglobin: 9.3, hematocrit: 26.8, MCV: 109.4, platelets: 117.  Normal differential. CMP: Sodium: 128, calcium: 8.3, albumin: 3.5, bilirubin total: 1.6, alkaline phosphatase: 326, AST: 76, ALT: 24, GFR: 102, creatinine: 0.82   Lab Results  Component Value Date   WBC 3.6 (L) 09/20/2023   NEUTROABS 2.2  09/20/2023   HGB 10.9 (L) 09/20/2023   HCT 31.7 (L) 09/20/2023   MCV 109.3 (H) 09/20/2023   PLT 151 09/20/2023      Chemistry      Component Value Date/Time   NA 129 (L) 04/30/2023 1244   NA 122 (L) 01/01/2023 1003   NA 132 (L) 03/09/2013 2142   K 4.0 04/30/2023 1244   K 2.7 (L) 03/09/2013 2142   CL 95 (L) 04/30/2023 1244   CL 97 (L) 03/09/2013 2142   CO2 19 (L) 04/30/2023 1244   CO2 26 03/09/2013 2142   BUN 10 04/30/2023 1244   BUN 7 01/01/2023 1003   BUN 8 03/09/2013 2142   CREATININE 1.14 04/30/2023 1244   CREATININE 0.86 03/09/2013 2142      Component Value Date/Time   CALCIUM 8.8 (L) 04/30/2023 1244   CALCIUM 8.4 (L) 03/09/2013 2142   ALKPHOS 250 (H) 05/13/2023 1455   ALKPHOS 87 03/09/2013 2142  AST 116 (H) 05/13/2023 1455   AST 33 03/09/2013 2142   ALT 44 05/13/2023 1455   ALT 31 03/09/2013 2142   BILITOT 2.0 (H) 05/13/2023 1455   BILITOT 0.4 01/01/2023 1003   BILITOT 0.4 03/09/2013 2142     ASSESSMENT & PLAN:  Patient is a 58 year old male with chronic alcohol use referred for pancytopenia   Pancytopenia (HCC) Patient is referred for pancytopenia.  Differential includes medication, alcohol, nutritional deficiencies, and liver dysfunction. -Leukopenia-chronic.  On chart review has lasted since 2021 at least.  Likely secondary to alcohol use.  Will obtain on ultrasound of abdomen to rule out liver cirrhosis.  Will also send a flow cytometry -Anemia: Macrocytic anemia likely secondary to alcohol use or nutritional deficiencies.  Will obtain nutritional labs today. -Thrombocytopenia: Likely secondary to alcohol use or may be liver cirrhosis.  Will obtain an ultrasound of abdomen to rule out liver cirrhosis.  -If all of the above workup comes back negative, will consider bone marrow biopsy. Return to clinic in 2 weeks with all the above results.   Orders Placed This Encounter  Procedures   US Abdomen Complete    Standing Status:   Future    Expected Date:    09/20/2023    Expiration Date:   09/19/2024    Reason for Exam (SYMPTOM  OR DIAGNOSIS REQUIRED):   Rule out cirrhosis    Preferred imaging location?:   [facility]   CBC with Differential/Platelet    Standing Status:   Future    Number of Occurrences:   1    Expected Date:   09/20/2023    Expiration Date:   09/19/2024   Vitamin B12    Standing Status:   Future    Number of Occurrences:   1    Expected Date:   09/20/2023    Expiration Date:   09/19/2024   Ferritin    Standing Status:   Future    Number of Occurrences:   1    Expected Date:   09/20/2023    Expiration Date:   09/19/2024   Folate    Standing Status:   Future    Number of Occurrences:   1    Expected Date:   09/20/2023    Expiration Date:   09/19/2024   Iron and TIBC    Standing Status:   Future    Number of Occurrences:   1    Expected Date:   09/20/2023    Expiration Date:   09/19/2024   Lactate dehydrogenase    Standing Status:   Future    Number of Occurrences:   1    Expected Date:   09/20/2023    Expiration Date:   09/19/2024   Uric acid    Standing Status:   Future    Number of Occurrences:   1    Expected Date:   09/20/2023    Expiration Date:   09/19/2024   Flow Cytometry, Peripheral Blood (Oncology)    Standing Status:   Future    Number of Occurrences:   1    Expected Date:   09/20/2023    Expiration Date:   09/19/2024    The total time spent in the appointment was 40 minutes encounter with patients including review of chart and various tests results, discussions about plan of care and coordination of care plan   All questions were answered. The patient knows to call the clinic with any problems, questions or concerns.  No barriers to learning was detected.   Cindie Crumbly, MD 3/17/20251:59 PM

## 2023-09-20 NOTE — Patient Instructions (Signed)
 VISIT SUMMARY:  You came to the clinic today because of spontaneous bruising on your arms and legs, low blood counts, generalized pain, unintentional weight loss, decreased appetite, and night sweats. We discussed your history of alcohol and tobacco use, as well as your long-standing tremors and digestive issues.  YOUR PLAN:  -THROMBOCYTOPENIA: Thrombocytopenia means you have a low number of platelets in your blood, which can cause easy bruising. We will do blood work to check your platelet and cell counts and perform an abdominal ultrasound to assess your liver. We will review the results in two weeks.  -UNINTENTIONAL WEIGHT LOSS: Your weight loss, poor appetite, night sweats, and fever suggest a possible systemic condition. We will evaluate the underlying causes with blood work and imaging.  -CHRONIC ALCOHOL USE: Chronic alcohol use can affect your blood counts and liver function. We discussed the impact of alcohol on your health.  INSTRUCTIONS:  Please schedule a follow-up appointment in two weeks to review your test results and discuss further management.

## 2023-09-21 ENCOUNTER — Ambulatory Visit (HOSPITAL_COMMUNITY): Payer: MEDICAID

## 2023-09-21 LAB — SURGICAL PATHOLOGY

## 2023-09-28 ENCOUNTER — Ambulatory Visit (HOSPITAL_COMMUNITY): Admission: RE | Admit: 2023-09-28 | Payer: MEDICAID | Source: Ambulatory Visit

## 2023-10-03 NOTE — Progress Notes (Deleted)
 Referring Provider: Elfredia Nevins, MD Primary Care Physician:  Elfredia Nevins, MD Primary GI Physician: Dr. Bonnetta Barry chief complaint on file.   HPI:   MOROCCO GIPE is a 58 y.o. male  with history of GERD, Barrett's esophagus, duodenal tubular adenoma, adenomatous colon polyps, hemorrhoids s/p banding x 3 in 2023, elevated LFTs, alcohol abuse, chronic diarrhea, colitis in July 2024,  anemia, ***   Colonoscopy 03/22/2023  Nonbleeding internal hemorrhoids, multiple sigmoid diverticula, normal TI s/p biopsy, localized area of mildly friable mucosa with contact bleeding again found in the ascending colon and in the cecum.  Biopsies were taken from the entire colon. Pathology: TI biopsy benign.  Mild reactive/reparative change noted in ascending colon, transverse colon.  Descending colon with minimally increased intraepithelial lymphocytes in the background of mild reactive/reparative change, nonspecific, insufficient for diagnosis of microscopic colitis.  Sigmoid and rectum biopsy were benign.   5 HIAA, celiac screen, TSH previously wnl.  C. difficile, GI pathogen panel previously negative, last in July 2024.   Last seen in the office 04/29/2023.  He continued to have numerous bowel movements daily, reporting at least 10 a day, small in volume, typically loose/mushy.  Intermittent nocturnal stools.  Rectal bleeding for the last 3 days.  Reported this being intermittent since his colonoscopy in September.  Stated when walking a lot, he would have a bowel movement.  Did not think he was taking Imodium.  Ran out of Zofran.  Waking up with nausea sometimes, occasional vomiting, possibly posttussive though reported he would vomit the last bite of food that he ate the night prior.  Reflux controlled with Nexium twice daily.  Also reported weight loss.  Eating supper only.  May eat some crackers in the morning, but typically drinking alcohol in the morning.  Also with mid abdominal pain radiating  to lower abdomen intermittently, sometimes related to bowel movements.  Denied association with meals.  Also continued with left rib pain.  Plan included CT A/P with contrast, labs, Anusol rectal cream, Imodium, Lidoderm patch for rib pain, patient to discuss medications to assist with alcohol cessation with PCP, 4-6 small meals daily, 1-2 protein shakes daily, limit Tylenol.   Regarding patient's chronic diarrhea and abnormal colonoscopy findings, case was discussed with Dr. Marletta Lor who recommended using Imodium for now.  Could consider trial of budesonide if not improved with Imodium.  Ultimately, patient needed to stop drinking alcohol as this was likely the driving factor of mucosal inflammation.  Symptoms not consistent with ischemic colitis as he had no postprandial symptoms and pattern of inflammation was not consistent with this.     CT I ordered was not completed. CT A/P with contrast with PCP 05/13/2023 no acute findings.  Prior colitis seen in July resolved.   Gastric emptying study 05/24/2023 was normal.  Labs showed: Hemoglobin 10.6 with macrocytic indices, iron panel within normal limits, AST 270, ALT 70, alk phos 369, total bilirubin 1.8, sodium 129.  ANA, AMA, ASMA, immunoglobulins within normal limits aside from mildly elevated IgA of 652.  Ceruloplasmin with normal limits.  No alpha-1 antitrypsin deficiency.  Hepatitis B and C negative. No immunity to hepatitis B.  Hepatitis A antibody total positive.    Today:  Diarrhea/rectal bleeding:    Weight loss:***   Elevated LFTs: Last abdominal imaging***   Last EGD 03/18/22: - 3 cm hiatal hernia. - Esophageal mucosal changes secondary to established short- segment Barrett' s disease. Biopsied. - Gastritis. Biopsied. - Duodenal scar.- resection  of possible residual polyp tissue in the duodenum.  - Path with duodenal adenoma with low-grade dysplasia, negative for high-grade dysplasia or carcinoma.  No H. pylori.  GE junction with  intestinal metaplasia   Past Medical History:  Diagnosis Date   Alcohol abuse    Anxiety and depression    Asthma    Blood transfusion    Complication of anesthesia    patient hemorrrhaged about 2 weeks after tonsillecotmy   COPD (chronic obstructive pulmonary disease) (HCC)    GERD (gastroesophageal reflux disease)    Hypertension    PTSD (post-traumatic stress disorder)    Stroke (HCC) 2021    Past Surgical History:  Procedure Laterality Date   APPENDECTOMY     BIOPSY  04/02/2020   Procedure: BIOPSY;  Surgeon: Lanelle Bal, DO;  [facility];  Service: Endoscopy;;   BIOPSY  03/18/2022   Procedure: BIOPSY;  Surgeon: Lanelle Bal, DO;  [facility];  Service: Endoscopy;;   BIOPSY  03/22/2023   Procedure: BIOPSY;  Surgeon: Lanelle Bal, DO;  [facility]: [facility];  Service: Endoscopy;;   COLONOSCOPY WITH PROPOFOL N/A 04/02/2020   Procedure: COLONOSCOPY WITH PROPOFOL;  Surgeon: Lanelle Bal, DO;  [facility]: [facility];  Service: Endoscopy;  Laterality: N/A;  8:30am   COLONOSCOPY WITH PROPOFOL N/A 03/18/2022   Surgeon: Lanelle Bal, DO; Nonbleeding internal hemorrhoids, multiple diverticula in the sigmoid colon, segmental area of moderately friable mucosa with spontaneous bleeding in the transverse and ascending colon biopsied. Pathology with no specific histopathologic change. Recommended 5 year surveillance.   COLONOSCOPY WITH PROPOFOL N/A 03/22/2023   Nonbleeding internal hemorrhoids, multiple sigmoid diverticula, normal TI s/p biopsy, localized area of mildly friable mucosa with contact bleeding in ascending colon and cecum.  Biopsies taken from entire colon.  TI biopsy benign.  Mild reactive/reparative change in in [facility] and TC.  Minimal increased intraepithelial lymphocytes and descending colon, nonspecific.  Sigmoid and rectum biopsy benign.   ESOPHAGOGASTRODUODENOSCOPY (EGD) WITH PROPOFOL N/A 04/02/2020   Procedure:  ESOPHAGOGASTRODUODENOSCOPY (EGD) WITH PROPOFOL;  Surgeon: Lanelle Bal, DO;  [facility];  Service: Endoscopy;  Laterality: N/A;   ESOPHAGOGASTRODUODENOSCOPY (EGD) WITH PROPOFOL N/A 03/21/2021   Procedure: ESOPHAGOGASTRODUODENOSCOPY (EGD) WITH PROPOFOL;  Surgeon: Lanelle Bal, DO;  [facility];  Service: Endoscopy;  Laterality: N/A;  11:00am   ESOPHAGOGASTRODUODENOSCOPY (EGD) WITH PROPOFOL N/A 03/18/2022   3 cm hiatal hernia, short segment Barrett's biopsied, gastritis biopsied, duodenal scar/?  Residual polyp tissue that was resected.  Pathology with mild nonspecific reactive gastropathy, negative for H. 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  Surveillance in 5 years.   POLYPECTOMY  04/02/2020   Procedure: POLYPECTOMY;  Surgeon: Lanelle Bal, DO;  [facility];  Service: Endoscopy;;   POLYPECTOMY  03/21/2021   Procedure: POLYPECTOMY;  Surgeon: Lanelle Bal, DO;  [facility];  Service: Endoscopy;;  duodenal   POLYPECTOMY  03/18/2022   Procedure: POLYPECTOMY;  Surgeon: Lanelle Bal, DO;  [facility];  Service: Endoscopy;;   REPAIR EXTENSOR TENDON Left 08/06/2022   Procedure: LEFT LONG FINGER EXTENSOR TENDON CENTRALIZATION;  Surgeon: Betha Loa, MD;  Location: [facility];  Service: Orthopedics;  Laterality: Left;  60 MIN   STERIOD INJECTION Left 08/06/2022   Procedure: LEFT CARPAL TUNNEL INJECTION;  Surgeon: Betha Loa, MD;  Location: [facility];  Service: Orthopedics;  Laterality: Left;  TONSILLECTOMY      Current Outpatient Medications  Medication Sig Dispense Refill   albuterol (VENTOLIN HFA) 108 (90 Base) MCG/ACT inhaler Inhale 1 puff into the lungs every 4 (four) hours as needed for wheezing or shortness of breath. 18 g 1   atorvastatin (LIPITOR) 40 MG tablet Take 1 tablet (40 mg total) by mouth daily. 90 tablet 2   buPROPion  (WELLBUTRIN) 75 MG tablet Take 2 tablets (150 mg total) by mouth 2 (two) times daily. 90 tablet 3   busPIRone (BUSPAR) 10 MG tablet Take 1 tablet (10 mg total) by mouth 2 (two) times daily. 180 tablet 3   celecoxib (CELEBREX) 200 MG capsule Take 200 mg by mouth daily.     chlordiazePOXIDE (LIBRIUM) 10 MG capsule Take 1 capsule (10 mg total) by mouth See admin instructions. Take Librium (CHLORDIAZEPOXIDE HCL CAPS) Take 1 Capsule 3 times a day for 2 days, then take 1 cap 2 times a day for 2 days, then 1 cap daily x 2 days , then STOP----this will Help You Quit Drinking Alcohol.... - Please do Not drink alcohol while taking this medication 12 capsule 0   cyanocobalamin 1000 MCG tablet Take 1 tablet (1,000 mcg total) by mouth daily. 90 tablet 1   diclofenac Sodium (VOLTAREN) 1 % GEL Apply 2 g topically 4 (four) times daily.     esomeprazole (NEXIUM) 40 MG capsule Take 1 capsule (40 mg total) by mouth 2 (two) times daily before a meal. 60 capsule 3   folic acid (FOLVITE) 1 MG tablet Take 1 tablet (1 mg total) by mouth daily. 90 tablet 3   hydrocortisone (ANUSOL-HC) 2.5 % rectal cream Place 1 Application rectally 2 (two) times daily. 30 g 1   lidocaine (LIDODERM) 5 % Place 1 patch onto the skin daily. Remove & Discard patch within 12 hours or as directed by MD 30 patch 0   loperamide (IMODIUM A-D) 2 MG tablet Take 2 tablets after first diarrhea type stool, then take 1 tablet after each additional loose stool up to a total of 4 tablets per day. 60 tablet 3   losartan (COZAAR) 50 MG tablet Take 1 tablet (50 mg total) by mouth daily. 90 tablet 3   Multiple Vitamin (MULTIVITAMIN WITH MINERALS) TABS tablet Take 1 tablet by mouth daily. 90 tablet 1   nicotine (NICODERM CQ - DOSED IN MG/24 HOURS) 21 mg/24hr patch Place 1 patch (21 mg total) onto the skin daily. 28 patch 0   ondansetron (ZOFRAN-ODT) 4 MG disintegrating tablet Take 1 tablet (4 mg total) by mouth every 8 (eight) hours as needed for nausea or vomiting.  20 tablet 0   Potassium Chloride ER 20 MEQ TBCR Take 1 tablet (20 mEq total) by mouth daily for 5 days. 1 tab daily by mouth 5 tablet 0   thiamine (VITAMIN B-1) 100 MG tablet Take 1 tablet (100 mg total) by mouth daily. 90 tablet 3   tiotropium (SPIRIVA) 18 MCG inhalation capsule Place 1 capsule (18 mcg total) into inhaler and inhale daily. 30 capsule 12   traZODone (DESYREL) 100 MG tablet Take 1 tablet (100 mg total) by mouth at bedtime. 90 tablet 2   No current facility-administered medications for this visit.    Allergies as of 10/04/2023 - Review Complete 09/20/2023  Allergen Reaction Noted   Bee venom Anaphylaxis 09/09/2011    Family History  Problem Relation Age of Onset   Colon cancer Neg Hx        Pt  knows little about family history    Social History   Socioeconomic History   Marital status: Legally Separated    Spouse name: Not on file   Number of children: Not on file   Years of education: Not on file   Highest education level: Not on file  Occupational History   Not on file  Tobacco Use   Smoking status: Every Day    Current packs/day: 1.00    Average packs/day: 1 pack/day for 30.0 years (30.0 ttl pk-yrs)    Types: Cigars, Cigarettes   Smokeless tobacco: Never  Vaping Use   Vaping status: Never Used  Substance and Sexual Activity   Alcohol use: Yes    Alcohol/week: 2.0 standard drinks of alcohol    Types: 2 Cans of beer per week    Comment: 2-40oz beers per day and liquor; current 6-8 12 oz peer a day.   Drug use: No   Sexual activity: Not Currently  Other Topics Concern   Not on file  Social History Narrative   Right handed   Social Drivers of Health   Financial Resource Strain: Not on file  Food Insecurity: No Food Insecurity (01/11/2023)   Hunger Vital Sign    Worried About Running Out of Food in the Last Year: Never true    Ran Out of Food in the Last Year: Never true  Transportation Needs: No Transportation Needs (01/11/2023)   PRAPARE -  Administrator, Civil Service (Medical): No    Lack of Transportation (Non-Medical): No  Physical Activity: Not on file  Stress: Not on file  Social Connections: Not on file    Review of Systems: Gen: Denies fever, chills, anorexia. Denies fatigue, weakness, weight loss.  CV: Denies chest pain, palpitations, syncope, peripheral edema, and claudication. Resp: Denies dyspnea at rest, cough, wheezing, coughing up blood, and pleurisy. GI: Denies vomiting blood, jaundice, and fecal incontinence.   Denies dysphagia or odynophagia. Derm: Denies rash, itching, dry skin Psych: Denies depression, anxiety, memory loss, confusion. No homicidal or suicidal ideation.  Heme: Denies bruising, bleeding, and enlarged lymph nodes.  Physical Exam: There were no vitals taken for this visit. General:   Alert and oriented. No distress noted. Pleasant and cooperative.  Head:  Normocephalic and atraumatic. Eyes:  Conjuctiva clear without scleral icterus. Heart:  S1, S2 present without murmurs appreciated. Lungs:  Clear to auscultation bilaterally. No wheezes, rales, or rhonchi. No distress.  Abdomen:  +BS, soft, non-tender and non-distended. No rebound or guarding. No HSM or masses noted. Msk:  Symmetrical without gross deformities. Normal posture. Extremities:  Without edema. Neurologic:  Alert and  oriented x4 Psych:  Normal mood and affect.    Assessment:     Plan:  ***   [name], PA-C [facility] 10/04/2023

## 2023-10-04 ENCOUNTER — Ambulatory Visit: Payer: MEDICAID | Admitting: Gastroenterology

## 2023-10-04 ENCOUNTER — Inpatient Hospital Stay: Payer: MEDICAID | Admitting: Oncology

## 2023-10-11 ENCOUNTER — Ambulatory Visit (HOSPITAL_COMMUNITY): Payer: MEDICAID

## 2023-10-18 ENCOUNTER — Inpatient Hospital Stay: Payer: MEDICAID | Admitting: Oncology

## 2023-10-21 ENCOUNTER — Ambulatory Visit (HOSPITAL_COMMUNITY): Payer: MEDICAID

## 2023-10-28 ENCOUNTER — Inpatient Hospital Stay: Payer: MEDICAID | Attending: Oncology | Admitting: Oncology

## 2023-10-28 ENCOUNTER — Ambulatory Visit: Payer: MEDICAID | Admitting: Internal Medicine

## 2023-11-02 ENCOUNTER — Other Ambulatory Visit: Payer: Self-pay

## 2023-11-02 ENCOUNTER — Encounter (HOSPITAL_COMMUNITY): Payer: Self-pay

## 2023-11-02 ENCOUNTER — Emergency Department (HOSPITAL_COMMUNITY): Payer: MEDICAID

## 2023-11-02 ENCOUNTER — Inpatient Hospital Stay (HOSPITAL_COMMUNITY)
Admission: EM | Admit: 2023-11-02 | Discharge: 2023-11-05 | DRG: 082 | Disposition: A | Discharge status: Home / Self Care | Payer: MEDICAID | Attending: Internal Medicine | Admitting: Internal Medicine

## 2023-11-02 ENCOUNTER — Inpatient Hospital Stay (HOSPITAL_COMMUNITY): Payer: MEDICAID

## 2023-11-02 DIAGNOSIS — F17210 Nicotine dependence, cigarettes, uncomplicated: Secondary | ICD-10-CM | POA: Diagnosis present

## 2023-11-02 DIAGNOSIS — S066XAA Traumatic subarachnoid hemorrhage with loss of consciousness status unknown, initial encounter: Secondary | ICD-10-CM | POA: Diagnosis present

## 2023-11-02 DIAGNOSIS — E861 Hypovolemia: Secondary | ICD-10-CM | POA: Diagnosis present

## 2023-11-02 DIAGNOSIS — I609 Nontraumatic subarachnoid hemorrhage, unspecified: Secondary | ICD-10-CM | POA: Diagnosis present

## 2023-11-02 DIAGNOSIS — R200 Anesthesia of skin: Secondary | ICD-10-CM

## 2023-11-02 DIAGNOSIS — E871 Hypo-osmolality and hyponatremia: Principal | ICD-10-CM | POA: Diagnosis present

## 2023-11-02 DIAGNOSIS — F10129 Alcohol abuse with intoxication, unspecified: Secondary | ICD-10-CM | POA: Diagnosis present

## 2023-11-02 DIAGNOSIS — Z8673 Personal history of transient ischemic attack (TIA), and cerebral infarction without residual deficits: Secondary | ICD-10-CM

## 2023-11-02 DIAGNOSIS — E43 Unspecified severe protein-calorie malnutrition: Secondary | ICD-10-CM | POA: Diagnosis present

## 2023-11-02 DIAGNOSIS — F4310 Post-traumatic stress disorder, unspecified: Secondary | ICD-10-CM | POA: Diagnosis present

## 2023-11-02 DIAGNOSIS — Y908 Blood alcohol level of 240 mg/100 ml or more: Secondary | ICD-10-CM | POA: Diagnosis present

## 2023-11-02 DIAGNOSIS — D61818 Other pancytopenia: Secondary | ICD-10-CM | POA: Diagnosis present

## 2023-11-02 DIAGNOSIS — G929 Unspecified toxic encephalopathy: Secondary | ICD-10-CM | POA: Diagnosis present

## 2023-11-02 DIAGNOSIS — Z79899 Other long term (current) drug therapy: Secondary | ICD-10-CM | POA: Diagnosis not present

## 2023-11-02 DIAGNOSIS — Z91030 Bee allergy status: Secondary | ICD-10-CM

## 2023-11-02 DIAGNOSIS — Z6820 Body mass index (BMI) 20.0-20.9, adult: Secondary | ICD-10-CM | POA: Diagnosis not present

## 2023-11-02 DIAGNOSIS — I10 Essential (primary) hypertension: Secondary | ICD-10-CM | POA: Diagnosis present

## 2023-11-02 DIAGNOSIS — S32009A Unspecified fracture of unspecified lumbar vertebra, initial encounter for closed fracture: Secondary | ICD-10-CM

## 2023-11-02 DIAGNOSIS — F32A Depression, unspecified: Secondary | ICD-10-CM | POA: Diagnosis present

## 2023-11-02 DIAGNOSIS — S32029A Unspecified fracture of second lumbar vertebra, initial encounter for closed fracture: Secondary | ICD-10-CM | POA: Diagnosis present

## 2023-11-02 DIAGNOSIS — W19XXXA Unspecified fall, initial encounter: Secondary | ICD-10-CM | POA: Diagnosis present

## 2023-11-02 DIAGNOSIS — F17290 Nicotine dependence, other tobacco product, uncomplicated: Secondary | ICD-10-CM | POA: Diagnosis present

## 2023-11-02 DIAGNOSIS — Z720 Tobacco use: Secondary | ICD-10-CM | POA: Diagnosis not present

## 2023-11-02 DIAGNOSIS — F10929 Alcohol use, unspecified with intoxication, unspecified: Secondary | ICD-10-CM | POA: Diagnosis present

## 2023-11-02 DIAGNOSIS — J4489 Other specified chronic obstructive pulmonary disease: Secondary | ICD-10-CM | POA: Diagnosis present

## 2023-11-02 DIAGNOSIS — F10920 Alcohol use, unspecified with intoxication, uncomplicated: Secondary | ICD-10-CM

## 2023-11-02 DIAGNOSIS — F419 Anxiety disorder, unspecified: Secondary | ICD-10-CM | POA: Diagnosis present

## 2023-11-02 DIAGNOSIS — Z860100 Personal history of colon polyps, unspecified: Secondary | ICD-10-CM | POA: Diagnosis not present

## 2023-11-02 DIAGNOSIS — K219 Gastro-esophageal reflux disease without esophagitis: Secondary | ICD-10-CM | POA: Diagnosis present

## 2023-11-02 DIAGNOSIS — R296 Repeated falls: Secondary | ICD-10-CM | POA: Diagnosis present

## 2023-11-02 LAB — URINALYSIS, ROUTINE W REFLEX MICROSCOPIC
Bilirubin Urine: NEGATIVE
Glucose, UA: NEGATIVE mg/dL
Hgb urine dipstick: NEGATIVE
Ketones, ur: NEGATIVE mg/dL
Leukocytes,Ua: NEGATIVE
Nitrite: NEGATIVE
Protein, ur: NEGATIVE mg/dL
Specific Gravity, Urine: 1.004 — ABNORMAL LOW (ref 1.005–1.030)
pH: 5 (ref 5.0–8.0)

## 2023-11-02 LAB — COMPREHENSIVE METABOLIC PANEL WITH GFR
ALT: 36 U/L (ref 0–44)
AST: 134 U/L — ABNORMAL HIGH (ref 15–41)
Albumin: 2.9 g/dL — ABNORMAL LOW (ref 3.5–5.0)
Alkaline Phosphatase: 271 U/L — ABNORMAL HIGH (ref 38–126)
Anion gap: 12 (ref 5–15)
BUN: <5 mg/dL — ABNORMAL LOW (ref 6–20)
CO2: 19 mmol/L — ABNORMAL LOW (ref 22–32)
Calcium: 8.5 mg/dL — ABNORMAL LOW (ref 8.9–10.3)
Chloride: 91 mmol/L — ABNORMAL LOW (ref 98–111)
Creatinine, Ser: 0.73 mg/dL (ref 0.61–1.24)
GFR, Estimated: >60 mL/min (ref >60)
Glucose, Bld: 86 mg/dL (ref 70–99)
Potassium: 4 mmol/L (ref 3.5–5.1)
Sodium: 122 mmol/L — ABNORMAL LOW (ref 135–145)
Total Bilirubin: 1.5 mg/dL — ABNORMAL HIGH (ref 0.0–1.2)
Total Protein: 7.1 g/dL (ref 6.5–8.1)

## 2023-11-02 LAB — CBC WITH DIFFERENTIAL/PLATELET
Abs Immature Granulocytes: 0 10*3/uL (ref 0.00–0.07)
Basophils Absolute: 0 10*3/uL (ref 0.0–0.1)
Basophils Relative: 0 %
Eosinophils Absolute: 0 10*3/uL (ref 0.0–0.5)
Eosinophils Relative: 0 %
HCT: 26.4 % — ABNORMAL LOW (ref 39.0–52.0)
Hemoglobin: 9.5 g/dL — ABNORMAL LOW (ref 13.0–17.0)
Immature Granulocytes: 0 %
Lymphocytes Relative: 28 %
Lymphs Abs: 0.6 10*3/uL — ABNORMAL LOW (ref 0.7–4.0)
MCH: 39.6 pg — ABNORMAL HIGH (ref 26.0–34.0)
MCHC: 36 g/dL (ref 30.0–36.0)
MCV: 110 fL — ABNORMAL HIGH (ref 80.0–100.0)
Monocytes Absolute: 0.3 10*3/uL (ref 0.1–1.0)
Monocytes Relative: 14 %
Neutro Abs: 1.3 10*3/uL — ABNORMAL LOW (ref 1.7–7.7)
Neutrophils Relative %: 58 %
Platelets: 70 10*3/uL — ABNORMAL LOW (ref 150–400)
RBC: 2.4 MIL/uL — ABNORMAL LOW (ref 4.22–5.81)
RDW: 15.8 % — ABNORMAL HIGH (ref 11.5–15.5)
Smear Review: DECREASED
WBC: 2.3 10*3/uL — ABNORMAL LOW (ref 4.0–10.5)
nRBC: 0 % (ref 0.0–0.2)

## 2023-11-02 LAB — SODIUM, URINE, RANDOM: Sodium, Ur: 18 mmol/L

## 2023-11-02 LAB — OSMOLALITY, URINE: Osmolality, Ur: 180 mosm/kg — ABNORMAL LOW (ref 300–900)

## 2023-11-02 LAB — GLUCOSE, CAPILLARY: Glucose-Capillary: 89 mg/dL (ref 70–99)

## 2023-11-02 LAB — PHOSPHORUS: Phosphorus: 3.6 mg/dL (ref 2.5–4.6)

## 2023-11-02 LAB — MAGNESIUM: Magnesium: 1.4 mg/dL — ABNORMAL LOW (ref 1.7–2.4)

## 2023-11-02 LAB — OSMOLALITY: Osmolality: 313 mosm/kg — ABNORMAL HIGH (ref 275–295)

## 2023-11-02 LAB — ETHANOL: Alcohol, Ethyl (B): 280 mg/dL — ABNORMAL HIGH (ref <15)

## 2023-11-02 MED ORDER — MAGNESIUM SULFATE 2 GM/50ML IV SOLN
2.0000 g | Freq: Once | INTRAVENOUS | Status: AC
  Administered 2023-11-02: 2 g via INTRAVENOUS
  Filled 2023-11-02: qty 50

## 2023-11-02 MED ORDER — LORAZEPAM 1 MG PO TABS: 1.0000 mg | ORAL_TABLET | ORAL | Status: DC | PRN

## 2023-11-02 MED ORDER — NICOTINE 14 MG/24HR TD PT24
14.0000 mg | MEDICATED_PATCH | Freq: Every day | TRANSDERMAL | Status: DC
  Administered 2023-11-02 – 2023-11-05 (×4): 14 mg via TRANSDERMAL
  Filled 2023-11-02 (×4): qty 1

## 2023-11-02 MED ORDER — THIAMINE MONONITRATE 100 MG PO TABS
100.0000 mg | ORAL_TABLET | Freq: Every day | ORAL | Status: DC
  Administered 2023-11-03 – 2023-11-05 (×3): 100 mg via ORAL
  Filled 2023-11-02 (×3): qty 1

## 2023-11-02 MED ORDER — ADULT MULTIVITAMIN W/MINERALS CH
1.0000 | ORAL_TABLET | Freq: Every day | ORAL | Status: DC
  Administered 2023-11-03 – 2023-11-05 (×3): 1 via ORAL
  Filled 2023-11-02 (×3): qty 1

## 2023-11-02 MED ORDER — MORPHINE SULFATE (PF) 4 MG/ML IV SOLN
4.0000 mg | Freq: Once | INTRAVENOUS | Status: AC
  Administered 2023-11-02: 4 mg via INTRAVENOUS
  Filled 2023-11-02: qty 1

## 2023-11-02 MED ORDER — SODIUM CHLORIDE 0.9 % IV BOLUS
500.0000 mL | Freq: Once | INTRAVENOUS | Status: AC
  Administered 2023-11-02: 500 mL via INTRAVENOUS

## 2023-11-02 MED ORDER — BUPROPION HCL 75 MG PO TABS
150.0000 mg | ORAL_TABLET | Freq: Two times a day (BID) | ORAL | Status: DC
  Administered 2023-11-02 – 2023-11-05 (×6): 150 mg via ORAL
  Filled 2023-11-02 (×7): qty 2

## 2023-11-02 MED ORDER — IOHEXOL 300 MG/ML  SOLN
100.0000 mL | Freq: Once | INTRAMUSCULAR | Status: AC | PRN
  Administered 2023-11-02: 100 mL via INTRAVENOUS

## 2023-11-02 MED ORDER — TRAZODONE HCL 100 MG PO TABS
100.0000 mg | ORAL_TABLET | Freq: Every day | ORAL | Status: DC
  Administered 2023-11-02 – 2023-11-04 (×3): 100 mg via ORAL
  Filled 2023-11-02 (×3): qty 1

## 2023-11-02 MED ORDER — POLYETHYLENE GLYCOL 3350 17 G PO PACK: 17.0000 g | PACK | Freq: Every day | ORAL | Status: DC | PRN

## 2023-11-02 MED ORDER — HYDROGEN PEROXIDE 3 % EX SOLN
CUTANEOUS | Status: AC
  Filled 2023-11-02: qty 473

## 2023-11-02 MED ORDER — LEVETIRACETAM ER 500 MG PO TB24
500.0000 mg | ORAL_TABLET | Freq: Two times a day (BID) | ORAL | Status: DC
Start: 2023-11-02 — End: 2023-11-06
  Administered 2023-11-02 – 2023-11-05 (×6): 500 mg via ORAL
  Filled 2023-11-02 (×8): qty 1

## 2023-11-02 MED ORDER — THIAMINE HCL 100 MG/ML IJ SOLN
100.0000 mg | Freq: Every day | INTRAMUSCULAR | Status: DC
  Filled 2023-11-02: qty 2

## 2023-11-02 MED ORDER — LORAZEPAM 2 MG/ML IJ SOLN
1.0000 mg | INTRAMUSCULAR | Status: DC | PRN
  Administered 2023-11-03: 1 mg via INTRAVENOUS
  Filled 2023-11-02: qty 1

## 2023-11-02 MED ORDER — FOLIC ACID 1 MG PO TABS
1.0000 mg | ORAL_TABLET | Freq: Every day | ORAL | Status: DC
Start: 2023-11-03 — End: 2023-11-06
  Administered 2023-11-03 – 2023-11-05 (×3): 1 mg via ORAL
  Filled 2023-11-02 (×3): qty 1

## 2023-11-02 MED ORDER — MORPHINE SULFATE (PF) 2 MG/ML IV SOLN: 2.0000 mg | INTRAVENOUS | Status: DC | PRN

## 2023-11-02 MED ORDER — LORAZEPAM 2 MG/ML IJ SOLN
0.5000 mg | Freq: Once | INTRAMUSCULAR | Status: AC
  Administered 2023-11-02: 0.5 mg via INTRAVENOUS

## 2023-11-02 MED ORDER — SODIUM CHLORIDE 0.9 % IV SOLN: INTRAVENOUS | Status: DC

## 2023-11-02 MED ORDER — PANTOPRAZOLE SODIUM 40 MG PO TBEC
40.0000 mg | DELAYED_RELEASE_TABLET | Freq: Every day | ORAL | Status: DC
  Administered 2023-11-03 – 2023-11-05 (×3): 40 mg via ORAL
  Filled 2023-11-02 (×3): qty 1

## 2023-11-02 MED ORDER — LORAZEPAM 2 MG/ML IJ SOLN
INTRAMUSCULAR | Status: AC
  Filled 2023-11-02: qty 1

## 2023-11-02 MED ORDER — ACETAMINOPHEN 650 MG RE SUPP: 650.0000 mg | Freq: Four times a day (QID) | RECTAL | Status: DC | PRN

## 2023-11-02 MED ORDER — BUSPIRONE HCL 5 MG PO TABS
10.0000 mg | ORAL_TABLET | Freq: Two times a day (BID) | ORAL | Status: DC
  Administered 2023-11-02 – 2023-11-05 (×6): 10 mg via ORAL
  Filled 2023-11-02 (×5): qty 2

## 2023-11-02 MED ORDER — ALBUTEROL SULFATE (2.5 MG/3ML) 0.083% IN NEBU: 3.0000 mL | INHALATION_SOLUTION | RESPIRATORY_TRACT | Status: DC | PRN

## 2023-11-02 MED ORDER — ACETAMINOPHEN 325 MG PO TABS
650.0000 mg | ORAL_TABLET | Freq: Four times a day (QID) | ORAL | Status: DC | PRN
  Administered 2023-11-03 (×2): 650 mg via ORAL
  Filled 2023-11-02 (×2): qty 2

## 2023-11-02 MED ORDER — HYDROCODONE-ACETAMINOPHEN 5-325 MG PO TABS
1.0000 | ORAL_TABLET | Freq: Four times a day (QID) | ORAL | Status: DC | PRN
  Administered 2023-11-02 – 2023-11-05 (×9): 1 via ORAL
  Filled 2023-11-02 (×10): qty 1

## 2023-11-02 NOTE — ED Notes (Signed)
 Patient having diarrhea. Refuses to use bedpan or stay in bed. Gets out of bed on own. Staff assisting patient ambulate to bathroom.

## 2023-11-02 NOTE — ED Notes (Addendum)
 Patient denies any blurry vision, or dizziness associated with headache. States he just doesn't feel well. Patient reattached to monitoring equipment. Patient calm and resting.

## 2023-11-02 NOTE — Assessment & Plan Note (Signed)
 Sodium 122, likely beer [place]oto mania considering chronic alcohol abuse. Na over the past  7 months has ranged from 129-134. -1 Liter bolus normal saline, continue N/s 100cc/hr x 15hrs - F/u Urine sodium, urine osmolality, serum osmolality

## 2023-11-02 NOTE — Assessment & Plan Note (Addendum)
 Multiple falls in the setting of alcohol intoxication.  With subsequent subarachnoid hemorrhage.  CT chest, abdomen and pelvis with contrast-acute displaced left L1 and L2 transverse process fractures, and possibly nondisplaced acute left L3 transverse process fractures. -Neurosurgery follow-up as outpatient - Hydrocodon acetaminophen  11/06/2023 Q 6 hourly as needed

## 2023-11-02 NOTE — ED Notes (Signed)
 [facility] at the bedside to transport patient.

## 2023-11-02 NOTE — Assessment & Plan Note (Addendum)
 Blood alcohol level of 280.  Presenting with multiple falls-with subsequent subarachnoid hemorrhage, and fracture of transverse processes.  Drinks 3 to 6 - 12 ounce beers daily, 5.9% alcohol.  He believes his last drink was probably this morning.  He has been drinking since he was 11 years.  Tachycardic heart rate 96-108 otherwise no sign of withdrawal. -CIWA as needed -Thiamine  folate multivitamins -  I have extensively counseled him on ceasing alcohol abuse, he voices understanding. -Resume Valium 10 mg twice daily

## 2023-11-02 NOTE — Assessment & Plan Note (Signed)
 WBC 2.3, platelets 86, hemoglobin 9.5.  Likely secondary to chronic alcohol abuse.

## 2023-11-02 NOTE — Assessment & Plan Note (Signed)
 Secondary to mechanical fall in the setting of alcohol intoxication.  CT head without contrast-Focal acute subarachnoid hemorrhage in the left sylvian fissure. No associated edema or midline shift.  - EDP talked to neurosurgeon Dr. Maritza Sidles, recommended repeat head CT in 8 hours, Keppra 500 mg twice daily for 1 week.

## 2023-11-02 NOTE — ED Triage Notes (Signed)
 Pt fell on Sunday and c/o pain to head and ribs.  Ptt report his cinder block step at his house and wobbly and that's what caused the fall.

## 2023-11-02 NOTE — Assessment & Plan Note (Signed)
 Magnesium  1.4 likely secondary to chronic alcohol abuse. K- 4. -Replete

## 2023-11-02 NOTE — ED Provider Notes (Cosign Needed Addendum)
 [place] EMERGENCY DEPARTMENT AT [facility] Provider Note   CSN: [id number] Arrival date & time: 11/02/23  1315     History  Chief Complaint  Patient presents with   Gabriel Koch is a 58 y.o. male past medical history significant for stroke, COPD, asthma, goal is him and hypertension presents today for a fall on Sunday.  Patient states that he has head and rib pain.  Patient reports a mechanical fall due to a wobbly cinderblock step at his house which caused him to fall.  Patient is not currently on any blood thinners.  When asking patient for any other information patient states "he has no blast and does not have to listen to anybody" as well as "he is also never been married".  Difficult to obtain any other information at this time.   Fall Associated symptoms include headaches.       Home Medications Prior to Admission medications   Medication Sig Start Date End Date Taking? Authorizing Provider  albuterol  (VENTOLIN  HFA) 108 (90 Base) MCG/ACT inhaler Inhale 1 puff into the lungs every 4 (four) hours as needed for wheezing or shortness of breath. 01/18/23   Colin Dawley, MD  atorvastatin  (LIPITOR) 40 MG tablet Take 1 tablet (40 mg total) by mouth daily. 01/18/23   Colin Dawley, MD  buPROPion  (WELLBUTRIN ) 75 MG tablet Take 2 tablets (150 mg total) by mouth 2 (two) times daily. 01/18/23   Colin Dawley, MD  busPIRone  (BUSPAR ) 10 MG tablet Take 1 tablet (10 mg total) by mouth 2 (two) times daily. 01/18/23   Colin Dawley, MD  celecoxib (CELEBREX) 200 MG capsule Take 200 mg by mouth daily.    [provider]  chlordiazePOXIDE  (LIBRIUM ) 10 MG capsule Take 1 capsule (10 mg total) by mouth See admin instructions. Take Librium  (CHLORDIAZEPOXIDE  HCL CAPS) Take 1 Capsule 3 times a day for 2 days, then take 1 cap 2 times a day for 2 days, then 1 cap daily x 2 days , then STOP----this will Help You Quit Drinking Alcohol.... - Please do Not drink  alcohol while taking this medication 01/18/23   Colin Dawley, MD  cyanocobalamin  1000 MCG tablet Take 1 tablet (1,000 mcg total) by mouth daily. 01/19/23   Colin Dawley, MD  diclofenac Sodium (VOLTAREN) 1 % GEL Apply 2 g topically 4 (four) times daily.    [provider]  esomeprazole  (NEXIUM ) 40 MG capsule Take 1 capsule (40 mg total) by mouth 2 (two) times daily before a meal. 01/18/23   Emokpae, Courage, MD  folic acid  (FOLVITE ) 1 MG tablet Take 1 tablet (1 mg total) by mouth daily. 01/18/23   Colin Dawley, MD  hydrocortisone  (ANUSOL -HC) 2.5 % rectal cream Place 1 Application rectally 2 (two) times daily. 04/29/23   Evander Hills, PA-C  lidocaine  (LIDODERM ) 5 % Place 1 patch onto the skin daily. Remove & Discard patch within 12 hours or as directed by MD 04/29/23   Evander Hills, PA-C  loperamide  (IMODIUM  A-D) 2 MG tablet Take 2 tablets after first diarrhea type stool, then take 1 tablet after each additional loose stool up to a total of 4 tablets per day. 04/29/23   Evander Hills, PA-C  losartan  (COZAAR ) 50 MG tablet Take 1 tablet (50 mg total) by mouth daily. 01/18/23   Colin Dawley, MD  Multiple Vitamin (MULTIVITAMIN WITH MINERALS) TABS tablet Take 1 tablet by mouth daily. 01/19/23   Colin Dawley, MD  nicotine  (NICODERM  CQ - DOSED IN MG/24 HOURS) 21 mg/24hr patch Place 1 patch (21 mg total) onto the skin daily. 01/19/23   Colin Dawley, MD  ondansetron  (ZOFRAN -ODT) 4 MG disintegrating tablet Take 1 tablet (4 mg total) by mouth every 8 (eight) hours as needed for nausea or vomiting. 01/18/23   Colin Dawley, MD  Potassium Chloride  ER 20 MEQ TBCR Take 1 tablet (20 mEq total) by mouth daily for 5 days. 1 tab daily by mouth 01/18/23 01/28/23  Colin Dawley, MD  thiamine  (VITAMIN B-1) 100 MG tablet Take 1 tablet (100 mg total) by mouth daily. 01/19/23   Colin Dawley, MD  tiotropium (SPIRIVA ) 18 MCG inhalation capsule Place 1 capsule (18 mcg total) into  inhaler and inhale daily. 01/18/23   Colin Dawley, MD  traZODone  (DESYREL ) 100 MG tablet Take 1 tablet (100 mg total) by mouth at bedtime. 01/18/23   Colin Dawley, MD      Allergies    Bee venom    Review of Systems   Review of Systems  Musculoskeletal:  Positive for arthralgias.  Neurological:  Positive for headaches.    Physical Exam Updated Vital Signs BP (!) 162/83   Pulse (!) 108   Temp 98.1 F (36.7 C) (Oral)   Resp 15   Ht 5\' 8"  (1.727 m)   Wt 60.7 kg   SpO2 98%   BMI 20.35 kg/m  Physical Exam Vitals and nursing note reviewed.  Constitutional:      General: He is not in acute distress.    Appearance: He is well-developed. He is not diaphoretic.     Comments: Chronically ill-appearing.  PE limited due to patient uncooperative.  HENT:     Head: Normocephalic and atraumatic.     Right Ear: External ear normal.     Left Ear: External ear normal.  Eyes:     Extraocular Movements: Extraocular movements intact.     Conjunctiva/sclera: Conjunctivae normal.  Pulmonary:     Effort: Pulmonary effort is normal. No respiratory distress.  Abdominal:     Comments: No obvious distention, patient refusing physical exam.  Musculoskeletal:     Cervical back: Normal range of motion and neck supple.     Right lower leg: No edema.     Left lower leg: No edema.  Skin:    General: Skin is warm and dry.     Capillary Refill: Capillary refill takes less than 2 seconds.     Findings: Bruising present.     Comments: Patient with multiple bruises to his torso, all 4 extremities, and notably left orbit  Neurological:     Mental Status: He is alert and oriented to person, place, and time.  Psychiatric:        Mood and Affect: Affect is angry.     Comments: Patient obviously intoxicated.     ED Results / Procedures / Treatments   Labs (all labs ordered are listed, but only abnormal results are displayed) Labs Reviewed  ETHANOL - Abnormal; Notable for the following  components:      Result Value   Alcohol, Ethyl (B) 280 (*)    All other components within normal limits  CBC WITH DIFFERENTIAL/PLATELET - Abnormal; Notable for the following components:   WBC 2.3 (*)    RBC 2.40 (*)    Hemoglobin 9.5 (*)    HCT 26.4 (*)    MCV 110.0 (*)    MCH 39.6 (*)    RDW 15.8 (*)    Platelets 70 (*)  Neutro Abs 1.3 (*)    Lymphs Abs 0.6 (*)    All other components within normal limits  URINALYSIS, ROUTINE W REFLEX MICROSCOPIC - Abnormal; Notable for the following components:   Specific Gravity, Urine 1.004 (*)    All other components within normal limits  COMPREHENSIVE METABOLIC PANEL WITH GFR - Abnormal; Notable for the following components:   Sodium 122 (*)    Chloride 91 (*)    CO2 19 (*)    BUN <5 (*)    Calcium  8.5 (*)    Albumin 2.9 (*)    AST 134 (*)    Alkaline Phosphatase 271 (*)    Total Bilirubin 1.5 (*)    All other components within normal limits  MAGNESIUM  - Abnormal; Notable for the following components:   Magnesium  1.4 (*)    All other components within normal limits  SODIUM, URINE, RANDOM  PHOSPHORUS  OSMOLALITY, URINE  OSMOLALITY    EKG None  Radiology CT Head Wo Contrast Result Date: 11/02/2023 CLINICAL DATA:  Head trauma, intoxication, recent fall with headache and rib pain. EXAM: CT HEAD WITHOUT CONTRAST CT MAXILLOFACIAL WITHOUT CONTRAST CT CERVICAL SPINE WITHOUT CONTRAST TECHNIQUE: Multidetector CT imaging of the head, cervical spine, and maxillofacial structures were performed using the standard protocol without intravenous contrast. Multiplanar CT image reconstructions of the cervical spine and maxillofacial structures were also generated. RADIATION DOSE REDUCTION: This exam was performed according to the departmental dose-optimization program which includes automated exposure control, adjustment of the mA and/or kV according to patient size and/or use of iterative reconstruction technique. COMPARISON:  MRI head 04/29/2020.  FINDINGS: CT HEAD FINDINGS Brain: Focal acute subarachnoid hemorrhage involving the left sylvian fissure. No additional areas of intracranial hemorrhage appreciated. No significant mass effect or midline shift. The basilar cisterns are patent. Nonspecific hypoattenuation in the periventricular and subcortical white matter favored to reflect chronic microvascular ischemic changes. Remote lacunar infarct in the right thalamus. Mild parenchymal volume loss. Ventricles: Prominence of the ventricles suggesting underlying parenchymal volume loss. Vascular: Atherosclerotic calcifications of the carotid siphons. No hyperdense vessel. Skull: No acute or aggressive finding. Other: Mastoid air cells are clear. CT MAXILLOFACIAL FINDINGS Osseous: Maxilla: Intact. Maxilla is edentulous with associated chronic bone loss. Pterygoid Plates: Intact Zygomatic Arch: Intact Orbits: Intact Ethmoid: Intact Sphenoid: Intact Frontal:Intact Mandible: Intact.  No condylar dislocation.  Edentulous. Nasal: Mildly displaced fracture of the right nasal bone. Nondisplaced left nasal bone fracture. Nasal Septum: Intact. No significant deviation. Orbits: Globes are intact. Lenses are normally located. Extraocular muscles and optic nerve sheath complexes are unremarkable. Normal appearance of the retro bulbar fat. There is left periorbital soft tissue swelling with swelling of the upper and lower eyelids. Preseptal soft tissue swelling is most pronounced over the lateral aspect of the orbit without evidence of postseptal involvement. Sinuses: Minimal mucosal thickening in the maxillary sinuses. No air-fluid levels. Soft tissues: Left facial soft tissue swelling overlying the maxillary sinus and zygomatic arch. CT CERVICAL SPINE FINDINGS Alignment: Straightening of the normal cervical lordosis. No listhesis. No facet subluxation or dislocation. Skull base and vertebrae: No compression fracture or displaced fracture in the cervical spine. No suspicious  osseous lesion. Soft tissues and spinal canal: No prevertebral fluid or swelling. No visible canal hematoma. Disc levels: Moderate disc space narrowing at C5-6. Disc osteophyte complex at C5-6 eccentric to the left without high-grade osseous spinal canal stenosis. There is mild-to-moderate foraminal narrowing on the left at C5-6. Upper chest: No acute abnormality. Other: None. Traumatic Brain Injury  Risk Stratification Skull Fracture: No - Low/mBIG 1 Subdural Hematoma (SDH): No - Low Subarachnoid Hemorrhage [facility]): trace (up to 2 sulci) - Low/mBIG 1 Epidural Hematoma (EDH): No - Low/mBIG 1 Cerebral contusion, intra-axial, intraparenchymal Hemorrhage (IPH): No Intraventricular Hemorrhage (IVH): No - Low/mBIG 1 Midline Shift > 1mm or Edema/effacement of sulci/vents: No - Low/mBIG 1 ---------------------------------------------------- IMPRESSION: Focal acute subarachnoid hemorrhage in the left sylvian fissure. No associated edema or midline shift. No acute fracture or traumatic malalignment of the cervical spine. Bilateral nasal bone fractures as above. On review of prior CT from 2021 these are likely chronic. Left periorbital and left facial soft tissue swelling. Chronic microvascular ischemic changes and mild parenchymal volume loss. Remote lacunar infarct in the right thalamus. Finding of acute subarachnoid hemorrhage called by telephone at the time of interpretation on 11/02/2023 at 5:43 pm to provider Judie Noun , who verbally acknowledged these results. Electronically Signed   By: Denny Flack M.D.   On: 11/02/2023 17:44   CT Cervical Spine Wo Contrast Result Date: 11/02/2023 CLINICAL DATA:  Head trauma, intoxication, recent fall with headache and rib pain. EXAM: CT HEAD WITHOUT CONTRAST CT MAXILLOFACIAL WITHOUT CONTRAST CT CERVICAL SPINE WITHOUT CONTRAST TECHNIQUE: Multidetector CT imaging of the head, cervical spine, and maxillofacial structures were performed using the standard protocol without intravenous  contrast. Multiplanar CT image reconstructions of the cervical spine and maxillofacial structures were also generated. RADIATION DOSE REDUCTION: This exam was performed according to the departmental dose-optimization program which includes automated exposure control, adjustment of the mA and/or kV according to patient size and/or use of iterative reconstruction technique. COMPARISON:  MRI head 04/29/2020. FINDINGS: CT HEAD FINDINGS Brain: Focal acute subarachnoid hemorrhage involving the left sylvian fissure. No additional areas of intracranial hemorrhage appreciated. No significant mass effect or midline shift. The basilar cisterns are patent. Nonspecific hypoattenuation in the periventricular and subcortical white matter favored to reflect chronic microvascular ischemic changes. Remote lacunar infarct in the right thalamus. Mild parenchymal volume loss. Ventricles: Prominence of the ventricles suggesting underlying parenchymal volume loss. Vascular: Atherosclerotic calcifications of the carotid siphons. No hyperdense vessel. Skull: No acute or aggressive finding. Other: Mastoid air cells are clear. CT MAXILLOFACIAL FINDINGS Osseous: Maxilla: Intact. Maxilla is edentulous with associated chronic bone loss. Pterygoid Plates: Intact Zygomatic Arch: Intact Orbits: Intact Ethmoid: Intact Sphenoid: Intact Frontal:Intact Mandible: Intact.  No condylar dislocation.  Edentulous. Nasal: Mildly displaced fracture of the right nasal bone. Nondisplaced left nasal bone fracture. Nasal Septum: Intact. No significant deviation. Orbits: Globes are intact. Lenses are normally located. Extraocular muscles and optic nerve sheath complexes are unremarkable. Normal appearance of the retro bulbar fat. There is left periorbital soft tissue swelling with swelling of the upper and lower eyelids. Preseptal soft tissue swelling is most pronounced over the lateral aspect of the orbit without evidence of postseptal involvement. Sinuses:  Minimal mucosal thickening in the maxillary sinuses. No air-fluid levels. Soft tissues: Left facial soft tissue swelling overlying the maxillary sinus and zygomatic arch. CT CERVICAL SPINE FINDINGS Alignment: Straightening of the normal cervical lordosis. No listhesis. No facet subluxation or dislocation. Skull base and vertebrae: No compression fracture or displaced fracture in the cervical spine. No suspicious osseous lesion. Soft tissues and spinal canal: No prevertebral fluid or swelling. No visible canal hematoma. Disc levels: Moderate disc space narrowing at C5-6. Disc osteophyte complex at C5-6 eccentric to the left without high-grade osseous spinal canal stenosis. There is mild-to-moderate foraminal narrowing on the left at C5-6. Upper chest: No acute abnormality. Other:  None. Traumatic Brain Injury Risk Stratification Skull Fracture: No - Low/mBIG 1 Subdural Hematoma (SDH): No - Low Subarachnoid Hemorrhage [facility]): trace (up to 2 sulci) - Low/mBIG 1 Epidural Hematoma (EDH): No - Low/mBIG 1 Cerebral contusion, intra-axial, intraparenchymal Hemorrhage (IPH): No Intraventricular Hemorrhage (IVH): No - Low/mBIG 1 Midline Shift > 1mm or Edema/effacement of sulci/vents: No - Low/mBIG 1 ---------------------------------------------------- IMPRESSION: Focal acute subarachnoid hemorrhage in the left sylvian fissure. No associated edema or midline shift. No acute fracture or traumatic malalignment of the cervical spine. Bilateral nasal bone fractures as above. On review of prior CT from 2021 these are likely chronic. Left periorbital and left facial soft tissue swelling. Chronic microvascular ischemic changes and mild parenchymal volume loss. Remote lacunar infarct in the right thalamus. Finding of acute subarachnoid hemorrhage called by telephone at the time of interpretation on 11/02/2023 at 5:43 pm to provider Judie Noun , who verbally acknowledged these results. Electronically Signed   By: Denny Flack M.D.   On:  11/02/2023 17:44   CT Maxillofacial WO CM Result Date: 11/02/2023 CLINICAL DATA:  Head trauma, intoxication, recent fall with headache and rib pain. EXAM: CT HEAD WITHOUT CONTRAST CT MAXILLOFACIAL WITHOUT CONTRAST CT CERVICAL SPINE WITHOUT CONTRAST TECHNIQUE: Multidetector CT imaging of the head, cervical spine, and maxillofacial structures were performed using the standard protocol without intravenous contrast. Multiplanar CT image reconstructions of the cervical spine and maxillofacial structures were also generated. RADIATION DOSE REDUCTION: This exam was performed according to the departmental dose-optimization program which includes automated exposure control, adjustment of the mA and/or kV according to patient size and/or use of iterative reconstruction technique. COMPARISON:  MRI head 04/29/2020. FINDINGS: CT HEAD FINDINGS Brain: Focal acute subarachnoid hemorrhage involving the left sylvian fissure. No additional areas of intracranial hemorrhage appreciated. No significant mass effect or midline shift. The basilar cisterns are patent. Nonspecific hypoattenuation in the periventricular and subcortical white matter favored to reflect chronic microvascular ischemic changes. Remote lacunar infarct in the right thalamus. Mild parenchymal volume loss. Ventricles: Prominence of the ventricles suggesting underlying parenchymal volume loss. Vascular: Atherosclerotic calcifications of the carotid siphons. No hyperdense vessel. Skull: No acute or aggressive finding. Other: Mastoid air cells are clear. CT MAXILLOFACIAL FINDINGS Osseous: Maxilla: Intact. Maxilla is edentulous with associated chronic bone loss. Pterygoid Plates: Intact Zygomatic Arch: Intact Orbits: Intact Ethmoid: Intact Sphenoid: Intact Frontal:Intact Mandible: Intact.  No condylar dislocation.  Edentulous. Nasal: Mildly displaced fracture of the right nasal bone. Nondisplaced left nasal bone fracture. Nasal Septum: Intact. No significant deviation.  Orbits: Globes are intact. Lenses are normally located. Extraocular muscles and optic nerve sheath complexes are unremarkable. Normal appearance of the retro bulbar fat. There is left periorbital soft tissue swelling with swelling of the upper and lower eyelids. Preseptal soft tissue swelling is most pronounced over the lateral aspect of the orbit without evidence of postseptal involvement. Sinuses: Minimal mucosal thickening in the maxillary sinuses. No air-fluid levels. Soft tissues: Left facial soft tissue swelling overlying the maxillary sinus and zygomatic arch. CT CERVICAL SPINE FINDINGS Alignment: Straightening of the normal cervical lordosis. No listhesis. No facet subluxation or dislocation. Skull base and vertebrae: No compression fracture or displaced fracture in the cervical spine. No suspicious osseous lesion. Soft tissues and spinal canal: No prevertebral fluid or swelling. No visible canal hematoma. Disc levels: Moderate disc space narrowing at C5-6. Disc osteophyte complex at C5-6 eccentric to the left without high-grade osseous spinal canal stenosis. There is mild-to-moderate foraminal narrowing on the left at C5-6. Upper chest: No  acute abnormality. Other: None. Traumatic Brain Injury Risk Stratification Skull Fracture: No - Low/mBIG 1 Subdural Hematoma (SDH): No - Low Subarachnoid Hemorrhage [facility]): trace (up to 2 sulci) - Low/mBIG 1 Epidural Hematoma (EDH): No - Low/mBIG 1 Cerebral contusion, intra-axial, intraparenchymal Hemorrhage (IPH): No Intraventricular Hemorrhage (IVH): No - Low/mBIG 1 Midline Shift > 1mm or Edema/effacement of sulci/vents: No - Low/mBIG 1 ---------------------------------------------------- IMPRESSION: Focal acute subarachnoid hemorrhage in the left sylvian fissure. No associated edema or midline shift. No acute fracture or traumatic malalignment of the cervical spine. Bilateral nasal bone fractures as above. On review of prior CT from 2021 these are likely chronic. Left  periorbital and left facial soft tissue swelling. Chronic microvascular ischemic changes and mild parenchymal volume loss. Remote lacunar infarct in the right thalamus. Finding of acute subarachnoid hemorrhage called by telephone at the time of interpretation on 11/02/2023 at 5:43 pm to provider Judie Noun , who verbally acknowledged these results. Electronically Signed   By: Denny Flack M.D.   On: 11/02/2023 17:44   CT CHEST ABDOMEN PELVIS W CONTRAST Result Date: 11/02/2023 CLINICAL DATA:  Abdominal trauma, blunt EXAM: CT CHEST, ABDOMEN, AND PELVIS WITH CONTRAST TECHNIQUE: Multidetector CT imaging of the chest, abdomen and pelvis was performed following the standard protocol during bolus administration of intravenous contrast. RADIATION DOSE REDUCTION: This exam was performed according to the departmental dose-optimization program which includes automated exposure control, adjustment of the mA and/or kV according to patient size and/or use of iterative reconstruction technique. CONTRAST:  OMNIPAQUE  IOHEXOL  300 MG/ML  SOLN COMPARISON:  CT abdomen pelvis 05/13/2023 FINDINGS: CHEST: Cardiovascular: No aortic injury. The thoracic aorta is normal in caliber. The heart is normal in size. No significant pericardial effusion. Moderate severe atherosclerotic plaque. At least 3 vessel coronary artery calcification. Mediastinum/Nodes: No pneumomediastinum. No mediastinal hematoma. The esophagus is unremarkable. The thyroid  is unremarkable. The central airways are patent. No mediastinal, hilar, or axillary lymphadenopathy. Lungs/Pleura: No focal consolidation. No pulmonary nodule. No pulmonary mass. No pulmonary contusion or laceration. No pneumatocele formation. No pleural effusion. No pneumothorax. No hemothorax. Musculoskeletal/Chest wall: No chest wall mass. Old healed nonunionized left posterior rib fractures. No acute rib or sternal fracture. No spinal fracture. ABDOMEN / PELVIS: Hepatobiliary: Not enlarged.  No focal lesion. No laceration or subcapsular hematoma. The gallbladder is otherwise unremarkable with no radio-opaque gallstones. No biliary ductal dilatation. Pancreas: Normal pancreatic contour. No main pancreatic duct dilatation. Spleen: Not enlarged. No focal lesion. No laceration, subcapsular hematoma, or vascular injury. Adrenals/Urinary Tract: No nodularity bilaterally. Bilateral kidneys enhance symmetrically. No hydronephrosis. No contusion, laceration, or subcapsular hematoma. No injury to the vascular structures or collecting systems. No hydroureter. The urinary bladder is unremarkable. Stomach/Bowel: No small or large bowel wall thickening or dilatation. Colonic diverticulosis. The appendix is unremarkable. Vasculature/Lymphatics: Severe atherosclerotic plaque. No abdominal aorta or iliac aneurysm. No active contrast extravasation or pseudoaneurysm. No abdominal, pelvic, inguinal lymphadenopathy. Reproductive: Prostate is unremarkable. Other: No simple free fluid ascites. No pneumoperitoneum. No hemoperitoneum. No mesenteric hematoma identified. No organized fluid collection. Musculoskeletal: No significant soft tissue hematoma. No acute pelvic fracture. Acute displaced left L1 and L2 transverse process fractures. Possible nondisplaced L3 left transverse process fracture. Other ports and devices: None. IMPRESSION: 1. Acute displaced left L1 and L2 transverse process fractures. 2. Possible nondisplaced acute left L3 transverse process fracture. 3. No acute intrathoracic, intra-abdominal, intrapelvic traumatic injury. 4. No acute fracture or traumatic malalignment of the thoracic spine. 5.  Aortic Atherosclerosis (ICD10-I70.0). Electronically Signed   By:  Morgane  Naveau M.D.   On: 11/02/2023 17:36    Procedures Procedures    Medications Ordered in ED Medications  hydrogen peroxide 3 % external solution (has no administration in time range)  levETIRAcetam (KEPPRA XR) 24 hr tablet 500 mg (has no  administration in time range)  magnesium  sulfate IVPB 2 g 50 mL (has no administration in time range)  iohexol  (OMNIPAQUE ) 300 MG/ML solution 100 mL (100 mLs Intravenous Contrast Given 11/02/23 1710)  sodium chloride  0.9 % bolus 500 mL (500 mLs Intravenous New Bag/Given 11/02/23 1916)  morphine  (PF) 4 MG/ML injection 4 mg (4 mg Intravenous Given 11/02/23 1914)    ED Course/ Medical Decision Making/ A&P Clinical Course as of 11/02/23 1923  Tue Nov 02, 2023  1441 He is brought in by ambulance after a fall few days ago.  He has significant bruising around his left orbit and abrasions.  Complaining of pain in his chest also.  Getting lab work and imaging.  Disposition per results of testing. [MB]    Clinical Course User Index [MB] Tonya Fredrickson, MD                                 Medical Decision Making Amount and/or Complexity of Data Reviewed Labs: ordered. Radiology: ordered.  Risk Prescription drug management. Decision regarding hospitalization.   This patient presents to the ED for concern of fall, this involves an extensive number of treatment options, and is a complaint that carries with it a high risk of complications and morbidity.  The differential diagnosis includes brain bleed, orbit fracture, orbital blowout fracture, internal bleeding, broken labs, hematomas, intoxication   Co morbidities that complicate the patient evaluation  stroke, COPD, asthma, hypertension, alcoholism   Additional history obtained: External records from outside source obtained and reviewed including oncology and gastroenterology notes   Lab Tests:  I Ordered, and personally interpreted labs.  The pertinent results include: Ethanol 280, anemia and leukopenia which is chronic per historical values, hyponatremia 122, decreased CO2 at 19, hypochloremia at 91, bun less than 5, decreased calcium  at 8.5, elevated alk phos at 271, increased AST at 134, increased total bili at 1.5, glucose 86 UA with  decreased specific gravity Urine sodium 18 Hypomagnesemia 1.4   Imaging Studies ordered:  I ordered imaging studies including CT head and C-spine Noncon, CT maxillofacial Noncon, CT chest abdomen pelvis with contrast I independently visualized and interpreted imaging which showed acute displaced left L1 and L2 transverse process fractures.  Possible nondisplaced acute left transverse process fracture.  No acute intrathoracic, intra-abdominal, intrapelvic traumatic injury.  No acute fracture or traumatic malalignment of the thoracic spine.  Focal acute subarachnoid hemorrhage in the left sylvian fissure.  No associated edema or midline shift.  Bilateral nasal bone fractures as above.  On review of prior CT from 2021 these are likely chronic.  Left periorbital and left facial soft tissue swelling. I agree with the radiologist interpretation   Cardiac Monitoring: / EKG:  The patient was maintained on a cardiac monitor.  I personally viewed and interpreted the cardiac monitored which showed an underlying rhythm of: Sinus, Prolonged PR interval   Problem List / ED Course / Critical interventions / Medication management  I ordered medication including NS  for fluid maintenance.  Magnesium  for hypomagnesemia. Reevaluation of the patient after these medicines showed that the patient stayed the same I have reviewed the patients home medicines and  have made adjustments as needed   Consultations Obtained:  I requested consultation with the Neurosurgery, Dr. Maritza Sidles,  and discussed lab and imaging findings as well as pertinent plan - they recommend: Repeat scan CT Head in 8 hrs, Keppra 500mg  BID x 1wk, F/u outpt for L spine Hospitalist, Dr. Quintella Buck who is agreeable to admission for hyponatremia   Test / Admission - Considered:  Admit         Final Clinical Impression(s) / ED Diagnoses Final diagnoses:  Closed fracture of transverse process of lumbar vertebra, initial encounter (HCC)   Subarachnoid hemorrhage (HCC)  Hyponatremia    Rx / DC Orders ED Discharge Orders     None         Carie Charity, PA-C 11/02/23 1923    Carie Charity, PA-C 11/02/23 1936    Tonya Fredrickson, MD 11/03/23 1026

## 2023-11-02 NOTE — ED Notes (Signed)
 [facility] called at this time. Loic Hobin

## 2023-11-02 NOTE — H&P (Signed)
 History and Physical    Gabriel Koch BJY:[id number] DOB: 07/08/1965 DOA: 11/02/2023  PCP: Kathyleen Parkins, MD   Patient coming from: Home  I have personally briefly reviewed patient's old medical records in [facility]  Chief Complaint: Falls  HPI: KABLE RETH is a 58 y.o. male with medical history significant for alcohol abuse, anxiety and depression, stroke, COPD, hypertension.  Patient presented to the ED with complaints of falls, head and rib pain. Patient presented to the ED with complaints of falls 2 days ago 4/27 and again this morning, is unsure if he has had more falls as he does not remember.  He has multiple bruising to his upper extremities and face from falls.  He is unsure if he has had dizziness when standing, he denies any chest pain or difficulty breathing.  No vomiting , reports some diarrhea.  No abdominal pain.  ED Course: Temperature 98.1.  Heart rate 96 - 108.  Blood pressure systolic 103-162.  Na- 122.  Potassium 1.4.  Blood alcohol level of 280.  Head CT- Focal acute subarachnoid hemorrhage in the left sylvian fissure. No associated edema or midline shift.  CT chest abdomen and pelvis with contrast-  Acute displaced left L1 and L2 transverse process fractures. 2. Possible nondisplaced acute left L3 transverse process fracture. EDP talked to neurosurgery-Dr. Tomlinson-recommended repeat CT in 8 hours, start Keppra 500 twice daily for 1 week.  Follow-up outpatient for L-spine.  Review of Systems: As per HPI all other systems reviewed and negative.  Past Medical History:  Diagnosis Date   Alcohol abuse    Anxiety and depression    Asthma    Blood transfusion    Complication of anesthesia    patient hemorrrhaged about 2 weeks after tonsillecotmy   COPD (chronic obstructive pulmonary disease) (HCC)    GERD (gastroesophageal reflux disease)    Hypertension    PTSD (post-traumatic stress disorder)    Stroke (HCC) 2021    Past Surgical History:   Procedure Laterality Date   APPENDECTOMY     BIOPSY  04/02/2020   Procedure: BIOPSY;  Surgeon: Vinetta Greening, DO;  [facility];  Service: Endoscopy;;   BIOPSY  03/18/2022   Procedure: BIOPSY;  Surgeon: Vinetta Greening, DO;  [facility];  Service: Endoscopy;;   BIOPSY  03/22/2023   Procedure: BIOPSY;  Surgeon: Vinetta Greening, DO;  [facility];  Service: Endoscopy;;   COLONOSCOPY WITH PROPOFOL  N/A 04/02/2020   Procedure: COLONOSCOPY WITH PROPOFOL ;  Surgeon: Vinetta Greening, DO;  [facility];  Service: Endoscopy;  Laterality: N/A;  8:30am   COLONOSCOPY WITH PROPOFOL  N/A 03/18/2022   Surgeon: Vinetta Greening, DO; Nonbleeding internal hemorrhoids, multiple diverticula in the sigmoid colon, segmental area of moderately friable mucosa with spontaneous bleeding in the transverse and ascending colon biopsied. Pathology with no specific histopathologic change. Recommended 5 year surveillance.   COLONOSCOPY WITH PROPOFOL  N/A 03/22/2023   Nonbleeding internal hemorrhoids, multiple sigmoid diverticula, normal TI s/p biopsy, localized area of mildly friable mucosa with contact bleeding in ascending colon and cecum.  Biopsies taken from entire colon.  TI biopsy benign.  Mild reactive/reparative change in in [facility] and TC.  Minimal increased intraepithelial lymphocytes and descending colon, nonspecific.  Sigmoid and rectum biopsy benign.   ESOPHAGOGASTRODUODENOSCOPY (EGD) WITH PROPOFOL  N/A 04/02/2020   Procedure: ESOPHAGOGASTRODUODENOSCOPY (EGD) WITH PROPOFOL ;  Surgeon: Vinetta Greening, DO;  [facility];  Service: Endoscopy;  Laterality:  N/A;   ESOPHAGOGASTRODUODENOSCOPY (EGD) WITH PROPOFOL  N/A 03/21/2021   Procedure: ESOPHAGOGASTRODUODENOSCOPY (EGD) WITH PROPOFOL ;  Surgeon: Vinetta Greening, DO;  [facility];  Service: Endoscopy;  Laterality: N/A;  11:00am   ESOPHAGOGASTRODUODENOSCOPY (EGD) WITH PROPOFOL  N/A 03/18/2022   3 cm  hiatal hernia, short segment Barrett's biopsied, gastritis biopsied, duodenal scar/?  Residual polyp tissue that was resected.  Pathology with mild nonspecific reactive gastropathy, negative for H. 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  Surveillance in 5 years.   POLYPECTOMY  04/02/2020   Procedure: POLYPECTOMY;  Surgeon: Vinetta Greening, DO;  [facility];  Service: Endoscopy;;   POLYPECTOMY  03/21/2021   Procedure: POLYPECTOMY;  Surgeon: Vinetta Greening, DO;  [facility];  Service: Endoscopy;;  duodenal   POLYPECTOMY  03/18/2022   Procedure: POLYPECTOMY;  Surgeon: Vinetta Greening, DO;  [facility];  Service: Endoscopy;;   REPAIR EXTENSOR TENDON Left 08/06/2022   Procedure: LEFT LONG FINGER EXTENSOR TENDON CENTRALIZATION;  Surgeon: Brunilda Capra, MD;  Location: [facility];  Service: Orthopedics;  Laterality: Left;  60 MIN   STERIOD INJECTION Left 08/06/2022   Procedure: LEFT CARPAL TUNNEL INJECTION;  Surgeon: Brunilda Capra, MD;  Location: [facility];  Service: Orthopedics;  Laterality: Left;   TONSILLECTOMY       reports that he has been smoking cigars and cigarettes. He has a 30 pack-year smoking history. He has never used smokeless tobacco. He reports current alcohol use of about 2.0 standard drinks of alcohol per week. He reports that he does not use drugs.  Allergies  Allergen Reactions   Bee Venom Anaphylaxis    Family History  Problem Relation Age of Onset   Colon cancer Neg Hx        Pt knows little about family history   Prior to Admission medications   Medication Sig Start Date End Date Taking? Authorizing Provider  albuterol  (VENTOLIN  HFA) 108 (90 Base) MCG/ACT inhaler Inhale 1 puff into the lungs every 4 (four) hours as needed for wheezing or shortness of breath. 01/18/23   Colin Dawley, MD  atorvastatin  (LIPITOR) 40 MG tablet Take 1 tablet (40 mg total) by mouth  daily. 01/18/23   Colin Dawley, MD  buPROPion  (WELLBUTRIN ) 75 MG tablet Take 2 tablets (150 mg total) by mouth 2 (two) times daily. 01/18/23   Colin Dawley, MD  busPIRone  (BUSPAR ) 10 MG tablet Take 1 tablet (10 mg total) by mouth 2 (two) times daily. 01/18/23   Colin Dawley, MD  celecoxib (CELEBREX) 200 MG capsule Take 200 mg by mouth daily.    [provider]  chlordiazePOXIDE  (LIBRIUM ) 10 MG capsule Take 1 capsule (10 mg total) by mouth See admin instructions. Take Librium  (CHLORDIAZEPOXIDE  HCL CAPS) Take 1 Capsule 3 times a day for 2 days, then take 1 cap 2 times a day for 2 days, then 1 cap daily x 2 days , then STOP----this will Help You Quit Drinking Alcohol.... - Please do Not drink alcohol while taking this medication 01/18/23   Colin Dawley, MD  cyanocobalamin  1000 MCG tablet Take 1 tablet (1,000 mcg total) by mouth daily. 01/19/23   Colin Dawley, MD  diclofenac Sodium (VOLTAREN) 1 % GEL Apply 2 g topically 4 (four) times daily.    [provider]  esomeprazole  (NEXIUM ) 40 MG capsule Take 1 capsule (40 mg total) by mouth 2 (two) times daily before a meal. 01/18/23   Colin Dawley, MD  folic acid  (FOLVITE ) 1 MG tablet Take 1 tablet (1 mg total) by mouth daily. 01/18/23   Colin Dawley, MD  hydrocortisone  (ANUSOL -HC) 2.5 % rectal cream Place 1 Application rectally 2 (two) times daily. 04/29/23   Evander Hills, PA-C  lidocaine  (LIDODERM ) 5 % Place 1 patch onto the skin daily. Remove & Discard patch within 12 hours or as directed by MD 04/29/23   Evander Hills, PA-C  loperamide  (IMODIUM  A-D) 2 MG tablet Take 2 tablets after first diarrhea type stool, then take 1 tablet after each additional loose stool up to a total of 4 tablets per day. 04/29/23   Evander Hills, PA-C  losartan  (COZAAR ) 50 MG tablet Take 1 tablet (50 mg total) by mouth daily. 01/18/23   Colin Dawley, MD  Multiple Vitamin (MULTIVITAMIN WITH MINERALS) TABS tablet Take 1 tablet by  mouth daily. 01/19/23   Colin Dawley, MD  nicotine  (NICODERM CQ  - DOSED IN MG/24 HOURS) 21 mg/24hr patch Place 1 patch (21 mg total) onto the skin daily. 01/19/23   Colin Dawley, MD  ondansetron  (ZOFRAN -ODT) 4 MG disintegrating tablet Take 1 tablet (4 mg total) by mouth every 8 (eight) hours as needed for nausea or vomiting. 01/18/23   Colin Dawley, MD  Potassium Chloride  ER 20 MEQ TBCR Take 1 tablet (20 mEq total) by mouth daily for 5 days. 1 tab daily by mouth 01/18/23 01/28/23  Colin Dawley, MD  thiamine  (VITAMIN B-1) 100 MG tablet Take 1 tablet (100 mg total) by mouth daily. 01/19/23   Colin Dawley, MD  tiotropium (SPIRIVA ) 18 MCG inhalation capsule Place 1 capsule (18 mcg total) into inhaler and inhale daily. 01/18/23   Colin Dawley, MD  traZODone  (DESYREL ) 100 MG tablet Take 1 tablet (100 mg total) by mouth at bedtime. 01/18/23   Colin Dawley, MD    Physical Exam: Vitals:   11/02/23 1600 11/02/23 1615 11/02/23 1700 11/02/23 1845  BP: 103/86  109/84 (!) 162/83  Pulse:  (!) 101 (!) 107 (!) 108  Resp:  14 14 15   Temp:      TempSrc:      SpO2:  100% 98% 98%  Weight:      Height:        Constitutional: NAD, calm, comfortable Vitals:   11/02/23 1600 11/02/23 1615 11/02/23 1700 11/02/23 1845  BP: 103/86  109/84 (!) 162/83  Pulse:  (!) 101 (!) 107 (!) 108  Resp:  14 14 15   Temp:      TempSrc:      SpO2:  100% 98% 98%  Weight:      Height:       Eyes:  Racoon eye appearance to left eye, with bruising to left temporal area  ENMT: Mucous membranes are moist.  Neck: normal, supple, no masses, no thyromegaly Respiratory: clear to auscultation bilaterally, no wheezing, no crackles. Normal respiratory effort. No accessory muscle use.  Cardiovascular: Regular rate and rhythm, no murmurs / rubs / gallops. No extremity edema.  Extremities warm. Abdomen: no tenderness, no masses palpated. No hepatosplenomegaly. Bowel sounds positive.  Musculoskeletal: no clubbing /  cyanosis. No joint deformity upper and lower extremities.  Skin: Extensive ecchymosis to bilateral upper extremities multiple falls, no rashes, lesions, ulcers. No induration Neurologic: No facial symmetry, moving extremities spontaneously, speech fluent. Psychiatric: Normal judgment and insight. Alert and oriented x 3. Normal mood.   Labs on Admission: I have personally reviewed following labs and imaging studies  CBC: Recent Labs  Lab 11/02/23 1445  WBC 2.3*  NEUTROABS 1.3*  HGB 9.5*  HCT 26.4*  MCV 110.0*  PLT 70*   Basic Metabolic Panel: Recent Labs  Lab 11/02/23 1538 11/02/23 1810  NA 122*  --   K 4.0  --   CL 91*  --   CO2 19*  --   GLUCOSE 86  --   BUN <5*  --   CREATININE 0.73  --   CALCIUM  8.5*  --   MG  --  1.4*  PHOS  --  3.6   GFR: Estimated Creatinine Clearance: 87.5 mL/min (by C-G formula based on SCr of 0.73 mg/dL). Liver Function Tests: Recent Labs  Lab 11/02/23 1538  AST 134*  ALT 36  ALKPHOS 271*  BILITOT 1.5*  PROT 7.1  ALBUMIN 2.9*   Urine analysis:    Component Value Date/Time   COLORURINE YELLOW 11/02/2023 1539   APPEARANCEUR CLEAR 11/02/2023 1539   APPEARANCEUR Clear 03/09/2013 2142   LABSPEC 1.004 (L) 11/02/2023 1539   LABSPEC 1.002 03/09/2013 2142   PHURINE 5.0 11/02/2023 1539   GLUCOSEU NEGATIVE 11/02/2023 1539   GLUCOSEU Negative 03/09/2013 2142   HGBUR NEGATIVE 11/02/2023 1539   BILIRUBINUR NEGATIVE 11/02/2023 1539   BILIRUBINUR Negative 03/09/2013 2142   KETONESUR NEGATIVE 11/02/2023 1539   PROTEINUR NEGATIVE 11/02/2023 1539   NITRITE NEGATIVE 11/02/2023 1539   LEUKOCYTESUR NEGATIVE 11/02/2023 1539   LEUKOCYTESUR Negative 03/09/2013 2142    Radiological Exams on Admission: CT Head Wo Contrast Result Date: 11/02/2023 CLINICAL DATA:  Head trauma, intoxication, recent fall with headache and rib pain. EXAM: CT HEAD WITHOUT CONTRAST CT MAXILLOFACIAL WITHOUT CONTRAST CT CERVICAL SPINE WITHOUT CONTRAST TECHNIQUE:  Multidetector CT imaging of the head, cervical spine, and maxillofacial structures were performed using the standard protocol without intravenous contrast. Multiplanar CT image reconstructions of the cervical spine and maxillofacial structures were also generated. RADIATION DOSE REDUCTION: This exam was performed according to the departmental dose-optimization program which includes automated exposure control, adjustment of the mA and/or kV according to patient size and/or use of iterative reconstruction technique. COMPARISON:  MRI head 04/29/2020. FINDINGS: CT HEAD FINDINGS Brain: Focal acute subarachnoid hemorrhage involving the left sylvian fissure. No additional areas of intracranial hemorrhage appreciated. No significant mass effect or midline shift. The basilar cisterns are patent. Nonspecific hypoattenuation in the periventricular and subcortical white matter favored to reflect chronic microvascular ischemic changes. Remote lacunar infarct in the right thalamus. Mild parenchymal volume loss. Ventricles: Prominence of the ventricles suggesting underlying parenchymal volume loss. Vascular: Atherosclerotic calcifications of the carotid siphons. No hyperdense vessel. Skull: No acute or aggressive finding. Other: Mastoid air cells are clear. CT MAXILLOFACIAL FINDINGS Osseous: Maxilla: Intact. Maxilla is edentulous with associated chronic bone loss. Pterygoid Plates: Intact Zygomatic Arch: Intact Orbits: Intact Ethmoid: Intact Sphenoid: Intact Frontal:Intact Mandible: Intact.  No condylar dislocation.  Edentulous. Nasal: Mildly displaced fracture of the right nasal bone. Nondisplaced left nasal bone fracture. Nasal Septum: Intact. No significant deviation. Orbits: Globes are intact. Lenses are normally located. Extraocular muscles and optic nerve sheath complexes are unremarkable. Normal appearance of the retro bulbar fat. There is left periorbital soft tissue swelling with swelling of the upper and lower eyelids.  Preseptal soft tissue swelling is most pronounced over the lateral aspect of the orbit without evidence of postseptal involvement. Sinuses: Minimal mucosal thickening in the maxillary sinuses. No air-fluid levels. Soft tissues: Left facial soft tissue swelling overlying the maxillary sinus and zygomatic arch. CT CERVICAL SPINE FINDINGS Alignment: Straightening of the normal cervical lordosis. No  listhesis. No facet subluxation or dislocation. Skull base and vertebrae: No compression fracture or displaced fracture in the cervical spine. No suspicious osseous lesion. Soft tissues and spinal canal: No prevertebral fluid or swelling. No visible canal hematoma. Disc levels: Moderate disc space narrowing at C5-6. Disc osteophyte complex at C5-6 eccentric to the left without high-grade osseous spinal canal stenosis. There is mild-to-moderate foraminal narrowing on the left at C5-6. Upper chest: No acute abnormality. Other: None. Traumatic Brain Injury Risk Stratification Skull Fracture: No - Low/mBIG 1 Subdural Hematoma (SDH): No - Low Subarachnoid Hemorrhage [facility]): trace (up to 2 sulci) - Low/mBIG 1 Epidural Hematoma (EDH): No - Low/mBIG 1 Cerebral contusion, intra-axial, intraparenchymal Hemorrhage (IPH): No Intraventricular Hemorrhage (IVH): No - Low/mBIG 1 Midline Shift > 1mm or Edema/effacement of sulci/vents: No - Low/mBIG 1 ---------------------------------------------------- IMPRESSION: Focal acute subarachnoid hemorrhage in the left sylvian fissure. No associated edema or midline shift. No acute fracture or traumatic malalignment of the cervical spine. Bilateral nasal bone fractures as above. On review of prior CT from 2021 these are likely chronic. Left periorbital and left facial soft tissue swelling. Chronic microvascular ischemic changes and mild parenchymal volume loss. Remote lacunar infarct in the right thalamus. Finding of acute subarachnoid hemorrhage called by telephone at the time of interpretation on  11/02/2023 at 5:43 pm to provider Judie Noun , who verbally acknowledged these results. Electronically Signed   By: Denny Flack M.D.   On: 11/02/2023 17:44   CT Cervical Spine Wo Contrast Result Date: 11/02/2023 CLINICAL DATA:  Head trauma, intoxication, recent fall with headache and rib pain. EXAM: CT HEAD WITHOUT CONTRAST CT MAXILLOFACIAL WITHOUT CONTRAST CT CERVICAL SPINE WITHOUT CONTRAST TECHNIQUE: Multidetector CT imaging of the head, cervical spine, and maxillofacial structures were performed using the standard protocol without intravenous contrast. Multiplanar CT image reconstructions of the cervical spine and maxillofacial structures were also generated. RADIATION DOSE REDUCTION: This exam was performed according to the departmental dose-optimization program which includes automated exposure control, adjustment of the mA and/or kV according to patient size and/or use of iterative reconstruction technique. COMPARISON:  MRI head 04/29/2020. FINDINGS: CT HEAD FINDINGS Brain: Focal acute subarachnoid hemorrhage involving the left sylvian fissure. No additional areas of intracranial hemorrhage appreciated. No significant mass effect or midline shift. The basilar cisterns are patent. Nonspecific hypoattenuation in the periventricular and subcortical white matter favored to reflect chronic microvascular ischemic changes. Remote lacunar infarct in the right thalamus. Mild parenchymal volume loss. Ventricles: Prominence of the ventricles suggesting underlying parenchymal volume loss. Vascular: Atherosclerotic calcifications of the carotid siphons. No hyperdense vessel. Skull: No acute or aggressive finding. Other: Mastoid air cells are clear. CT MAXILLOFACIAL FINDINGS Osseous: Maxilla: Intact. Maxilla is edentulous with associated chronic bone loss. Pterygoid Plates: Intact Zygomatic Arch: Intact Orbits: Intact Ethmoid: Intact Sphenoid: Intact Frontal:Intact Mandible: Intact.  No condylar dislocation.   Edentulous. Nasal: Mildly displaced fracture of the right nasal bone. Nondisplaced left nasal bone fracture. Nasal Septum: Intact. No significant deviation. Orbits: Globes are intact. Lenses are normally located. Extraocular muscles and optic nerve sheath complexes are unremarkable. Normal appearance of the retro bulbar fat. There is left periorbital soft tissue swelling with swelling of the upper and lower eyelids. Preseptal soft tissue swelling is most pronounced over the lateral aspect of the orbit without evidence of postseptal involvement. Sinuses: Minimal mucosal thickening in the maxillary sinuses. No air-fluid levels. Soft tissues: Left facial soft tissue swelling overlying the maxillary sinus and zygomatic arch. CT CERVICAL SPINE FINDINGS Alignment: Straightening of the  normal cervical lordosis. No listhesis. No facet subluxation or dislocation. Skull base and vertebrae: No compression fracture or displaced fracture in the cervical spine. No suspicious osseous lesion. Soft tissues and spinal canal: No prevertebral fluid or swelling. No visible canal hematoma. Disc levels: Moderate disc space narrowing at C5-6. Disc osteophyte complex at C5-6 eccentric to the left without high-grade osseous spinal canal stenosis. There is mild-to-moderate foraminal narrowing on the left at C5-6. Upper chest: No acute abnormality. Other: None. Traumatic Brain Injury Risk Stratification Skull Fracture: No - Low/mBIG 1 Subdural Hematoma (SDH): No - Low Subarachnoid Hemorrhage [facility]): trace (up to 2 sulci) - Low/mBIG 1 Epidural Hematoma (EDH): No - Low/mBIG 1 Cerebral contusion, intra-axial, intraparenchymal Hemorrhage (IPH): No Intraventricular Hemorrhage (IVH): No - Low/mBIG 1 Midline Shift > 1mm or Edema/effacement of sulci/vents: No - Low/mBIG 1 ---------------------------------------------------- IMPRESSION: Focal acute subarachnoid hemorrhage in the left sylvian fissure. No associated edema or midline shift. No acute  fracture or traumatic malalignment of the cervical spine. Bilateral nasal bone fractures as above. On review of prior CT from 2021 these are likely chronic. Left periorbital and left facial soft tissue swelling. Chronic microvascular ischemic changes and mild parenchymal volume loss. Remote lacunar infarct in the right thalamus. Finding of acute subarachnoid hemorrhage called by telephone at the time of interpretation on 11/02/2023 at 5:43 pm to provider Judie Noun , who verbally acknowledged these results. Electronically Signed   By: Denny Flack M.D.   On: 11/02/2023 17:44   CT Maxillofacial WO CM Result Date: 11/02/2023 CLINICAL DATA:  Head trauma, intoxication, recent fall with headache and rib pain. EXAM: CT HEAD WITHOUT CONTRAST CT MAXILLOFACIAL WITHOUT CONTRAST CT CERVICAL SPINE WITHOUT CONTRAST TECHNIQUE: Multidetector CT imaging of the head, cervical spine, and maxillofacial structures were performed using the standard protocol without intravenous contrast. Multiplanar CT image reconstructions of the cervical spine and maxillofacial structures were also generated. RADIATION DOSE REDUCTION: This exam was performed according to the departmental dose-optimization program which includes automated exposure control, adjustment of the mA and/or kV according to patient size and/or use of iterative reconstruction technique. COMPARISON:  MRI head 04/29/2020. FINDINGS: CT HEAD FINDINGS Brain: Focal acute subarachnoid hemorrhage involving the left sylvian fissure. No additional areas of intracranial hemorrhage appreciated. No significant mass effect or midline shift. The basilar cisterns are patent. Nonspecific hypoattenuation in the periventricular and subcortical white matter favored to reflect chronic microvascular ischemic changes. Remote lacunar infarct in the right thalamus. Mild parenchymal volume loss. Ventricles: Prominence of the ventricles suggesting underlying parenchymal volume loss. Vascular:  Atherosclerotic calcifications of the carotid siphons. No hyperdense vessel. Skull: No acute or aggressive finding. Other: Mastoid air cells are clear. CT MAXILLOFACIAL FINDINGS Osseous: Maxilla: Intact. Maxilla is edentulous with associated chronic bone loss. Pterygoid Plates: Intact Zygomatic Arch: Intact Orbits: Intact Ethmoid: Intact Sphenoid: Intact Frontal:Intact Mandible: Intact.  No condylar dislocation.  Edentulous. Nasal: Mildly displaced fracture of the right nasal bone. Nondisplaced left nasal bone fracture. Nasal Septum: Intact. No significant deviation. Orbits: Globes are intact. Lenses are normally located. Extraocular muscles and optic nerve sheath complexes are unremarkable. Normal appearance of the retro bulbar fat. There is left periorbital soft tissue swelling with swelling of the upper and lower eyelids. Preseptal soft tissue swelling is most pronounced over the lateral aspect of the orbit without evidence of postseptal involvement. Sinuses: Minimal mucosal thickening in the maxillary sinuses. No air-fluid levels. Soft tissues: Left facial soft tissue swelling overlying the maxillary sinus and zygomatic arch. CT CERVICAL SPINE FINDINGS Alignment:  Straightening of the normal cervical lordosis. No listhesis. No facet subluxation or dislocation. Skull base and vertebrae: No compression fracture or displaced fracture in the cervical spine. No suspicious osseous lesion. Soft tissues and spinal canal: No prevertebral fluid or swelling. No visible canal hematoma. Disc levels: Moderate disc space narrowing at C5-6. Disc osteophyte complex at C5-6 eccentric to the left without high-grade osseous spinal canal stenosis. There is mild-to-moderate foraminal narrowing on the left at C5-6. Upper chest: No acute abnormality. Other: None. Traumatic Brain Injury Risk Stratification Skull Fracture: No - Low/mBIG 1 Subdural Hematoma (SDH): No - Low Subarachnoid Hemorrhage [facility]): trace (up to 2 sulci) - Low/mBIG 1  Epidural Hematoma (EDH): No - Low/mBIG 1 Cerebral contusion, intra-axial, intraparenchymal Hemorrhage (IPH): No Intraventricular Hemorrhage (IVH): No - Low/mBIG 1 Midline Shift > 1mm or Edema/effacement of sulci/vents: No - Low/mBIG 1 ---------------------------------------------------- IMPRESSION: Focal acute subarachnoid hemorrhage in the left sylvian fissure. No associated edema or midline shift. No acute fracture or traumatic malalignment of the cervical spine. Bilateral nasal bone fractures as above. On review of prior CT from 2021 these are likely chronic. Left periorbital and left facial soft tissue swelling. Chronic microvascular ischemic changes and mild parenchymal volume loss. Remote lacunar infarct in the right thalamus. Finding of acute subarachnoid hemorrhage called by telephone at the time of interpretation on 11/02/2023 at 5:43 pm to provider Judie Noun , who verbally acknowledged these results. Electronically Signed   By: Denny Flack M.D.   On: 11/02/2023 17:44   CT CHEST ABDOMEN PELVIS W CONTRAST Result Date: 11/02/2023 CLINICAL DATA:  Abdominal trauma, blunt EXAM: CT CHEST, ABDOMEN, AND PELVIS WITH CONTRAST TECHNIQUE: Multidetector CT imaging of the chest, abdomen and pelvis was performed following the standard protocol during bolus administration of intravenous contrast. RADIATION DOSE REDUCTION: This exam was performed according to the departmental dose-optimization program which includes automated exposure control, adjustment of the mA and/or kV according to patient size and/or use of iterative reconstruction technique. CONTRAST:  OMNIPAQUE  IOHEXOL  300 MG/ML  SOLN COMPARISON:  CT abdomen pelvis 05/13/2023 FINDINGS: CHEST: Cardiovascular: No aortic injury. The thoracic aorta is normal in caliber. The heart is normal in size. No significant pericardial effusion. Moderate severe atherosclerotic plaque. At least 3 vessel coronary artery calcification. Mediastinum/Nodes: No  pneumomediastinum. No mediastinal hematoma. The esophagus is unremarkable. The thyroid  is unremarkable. The central airways are patent. No mediastinal, hilar, or axillary lymphadenopathy. Lungs/Pleura: No focal consolidation. No pulmonary nodule. No pulmonary mass. No pulmonary contusion or laceration. No pneumatocele formation. No pleural effusion. No pneumothorax. No hemothorax. Musculoskeletal/Chest wall: No chest wall mass. Old healed nonunionized left posterior rib fractures. No acute rib or sternal fracture. No spinal fracture. ABDOMEN / PELVIS: Hepatobiliary: Not enlarged. No focal lesion. No laceration or subcapsular hematoma. The gallbladder is otherwise unremarkable with no radio-opaque gallstones. No biliary ductal dilatation. Pancreas: Normal pancreatic contour. No main pancreatic duct dilatation. Spleen: Not enlarged. No focal lesion. No laceration, subcapsular hematoma, or vascular injury. Adrenals/Urinary Tract: No nodularity bilaterally. Bilateral kidneys enhance symmetrically. No hydronephrosis. No contusion, laceration, or subcapsular hematoma. No injury to the vascular structures or collecting systems. No hydroureter. The urinary bladder is unremarkable. Stomach/Bowel: No small or large bowel wall thickening or dilatation. Colonic diverticulosis. The appendix is unremarkable. Vasculature/Lymphatics: Severe atherosclerotic plaque. No abdominal aorta or iliac aneurysm. No active contrast extravasation or pseudoaneurysm. No abdominal, pelvic, inguinal lymphadenopathy. Reproductive: Prostate is unremarkable. Other: No simple free fluid ascites. No pneumoperitoneum. No hemoperitoneum. No mesenteric hematoma identified. No organized fluid  collection. Musculoskeletal: No significant soft tissue hematoma. No acute pelvic fracture. Acute displaced left L1 and L2 transverse process fractures. Possible nondisplaced L3 left transverse process fracture. Other ports and devices: None. IMPRESSION: 1. Acute  displaced left L1 and L2 transverse process fractures. 2. Possible nondisplaced acute left L3 transverse process fracture. 3. No acute intrathoracic, intra-abdominal, intrapelvic traumatic injury. 4. No acute fracture or traumatic malalignment of the thoracic spine. 5.  Aortic Atherosclerosis (ICD10-I70.0). Electronically Signed   By: Morgane  Naveau M.D.   On: 11/02/2023 17:36    EKG: Independently reviewed.  Sinus rhythm, rate 98, QTc 449.  Incomplete RBB.  No significant change from prior.  Assessment/Plan Principal Problem:   Subarachnoid hemorrhage (HCC) Active Problems:   Hyponatremia   Falls   Alcohol intoxication (HCC)   Pancytopenia (HCC)   HTN (hypertension)   Hypomagnesemia   Tobacco abuse  Assessment and Plan: * Subarachnoid hemorrhage (HCC) Secondary to mechanical fall in the setting of alcohol intoxication.  CT head without contrast-Focal acute subarachnoid hemorrhage in the left sylvian fissure. No associated edema or midline shift.  - EDP talked to neurosurgeon Dr. Maritza Sidles, recommended repeat head CT in 8 hours, Keppra 500 mg twice daily for 1 week.  Alcohol intoxication (HCC) Blood alcohol level of 280.  Presenting with multiple falls-with subsequent subarachnoid hemorrhage, and fracture of transverse processes.  Drinks 3 to 6 - 12 ounce beers daily, 5.9% alcohol.  He believes his last drink was probably this morning.  He has been drinking since he was 11 years.  Tachycardic heart rate 96-108 otherwise no sign of withdrawal. -CIWA as needed -Thiamine  folate multivitamins -  I have extensively counseled him on ceasing alcohol abuse, he voices understanding. -Resume Valium 10 mg twice daily  Falls Multiple falls in the setting of alcohol intoxication.  With subsequent subarachnoid hemorrhage.  CT chest, abdomen and pelvis with contrast-acute displaced left L1 and L2 transverse process fractures, and possibly nondisplaced acute left L3 transverse process  fractures. -Neurosurgery follow-up as outpatient - Hydrocodon acetaminophen  11/06/2023 Q 6 hourly as needed  Hyponatremia Sodium 122, likely beer Poto mania considering chronic alcohol abuse. Na over the past  7 months has ranged from 129-134. -1 Liter bolus normal saline, continue N/s 100cc/hr x 15hrs - F/u Urine sodium, urine osmolality, serum osmolality  Pancytopenia (HCC) WBC 2.3, platelets 86, hemoglobin 9.5.  Likely secondary to chronic alcohol abuse.  Tobacco abuse Nicotine  patch.  Hypomagnesemia Magnesium  1.4 likely secondary to chronic alcohol abuse. K- 4. -Replete  HTN (hypertension) Stable. -Hold losartan  50mg  for contrast exposure   DVT prophylaxis: SCDS Code Status: FULL code Family Communication: None at bedside Disposition Plan: > 2 days Consults called: Neurosurgery Admission status: Inpt Stepdown I certify that at the point of admission it is my clinical judgment that the patient will require inpatient hospital care spanning beyond 2 midnights from the point of admission due to high intensity of service, high risk for further deterioration and high frequency of surveillance required.   CRITICAL CARE Performed by: Pati Bonine   Total critical care time: 70 minutes  Critical care time was exclusive of separately billable procedures and treating other patients.  Critical care was necessary to treat or prevent imminent or life-threatening deterioration.  Critical care was time spent personally by me on the following activities: development of treatment plan with patient and/or surrogate as well as nursing, discussions with consultants, evaluation of patient's response to treatment, examination of patient, obtaining history from patient or surrogate, ordering  and performing treatments and interventions, ordering and review of laboratory studies, ordering and review of radiographic studies, pulse oximetry and re-evaluation of patient's  condition.    Author: Pati Bonine, MD 11/02/2023 9:42 PM  For on call review [email or web address].

## 2023-11-02 NOTE — ED Notes (Signed)
 Patient still refusing to stay in bed and keeps removing monitoring equipment. Patient informed of testing results and encouraged to stay in bed and keep equipment on.

## 2023-11-02 NOTE — Assessment & Plan Note (Signed)
 Stable. -Hold losartan  50mg  for contrast exposure

## 2023-11-02 NOTE — Assessment & Plan Note (Signed)
 Nicotine  patch

## 2023-11-03 DIAGNOSIS — I609 Nontraumatic subarachnoid hemorrhage, unspecified: Secondary | ICD-10-CM | POA: Diagnosis not present

## 2023-11-03 LAB — CBC
HCT: 23.6 % — ABNORMAL LOW (ref 39.0–52.0)
Hemoglobin: 8.5 g/dL — ABNORMAL LOW (ref 13.0–17.0)
MCH: 39.5 pg — ABNORMAL HIGH (ref 26.0–34.0)
MCHC: 36 g/dL (ref 30.0–36.0)
MCV: 109.8 fL — ABNORMAL HIGH (ref 80.0–100.0)
Platelets: 52 10*3/uL — ABNORMAL LOW (ref 150–400)
RBC: 2.15 MIL/uL — ABNORMAL LOW (ref 4.22–5.81)
RDW: 16.2 % — ABNORMAL HIGH (ref 11.5–15.5)
WBC: 1.3 10*3/uL — CL (ref 4.0–10.5)
nRBC: 0 % (ref 0.0–0.2)

## 2023-11-03 LAB — MAGNESIUM: Magnesium: 1.8 mg/dL (ref 1.7–2.4)

## 2023-11-03 LAB — BASIC METABOLIC PANEL WITH GFR
Anion gap: 8 (ref 5–15)
BUN: <5 mg/dL — ABNORMAL LOW (ref 6–20)
CO2: 20 mmol/L — ABNORMAL LOW (ref 22–32)
Calcium: 8.2 mg/dL — ABNORMAL LOW (ref 8.9–10.3)
Chloride: 100 mmol/L (ref 98–111)
Creatinine, Ser: 0.74 mg/dL (ref 0.61–1.24)
GFR, Estimated: >60 mL/min (ref >60)
Glucose, Bld: 92 mg/dL (ref 70–99)
Potassium: 4.2 mmol/L (ref 3.5–5.1)
Sodium: 128 mmol/L — ABNORMAL LOW (ref 135–145)

## 2023-11-03 MED ORDER — MAGNESIUM CHLORIDE 64 MG PO TBEC
1.0000 | DELAYED_RELEASE_TABLET | Freq: Two times a day (BID) | ORAL | Status: DC
  Administered 2023-11-03 – 2023-11-05 (×5): 64 mg via ORAL
  Filled 2023-11-03 (×7): qty 1

## 2023-11-03 MED ORDER — CHLORDIAZEPOXIDE HCL 25 MG PO CAPS
25.0000 mg | ORAL_CAPSULE | Freq: Three times a day (TID) | ORAL | Status: AC
  Administered 2023-11-03 – 2023-11-05 (×6): 25 mg via ORAL
  Filled 2023-11-03 (×6): qty 1

## 2023-11-03 MED ORDER — SODIUM CHLORIDE 0.9 % IV SOLN
INTRAVENOUS | Status: AC
Start: 2023-11-03 — End: 2023-11-04

## 2023-11-03 MED ORDER — CHLORDIAZEPOXIDE HCL 25 MG PO CAPS: 25.0000 mg | ORAL_CAPSULE | Freq: Every day | ORAL | Status: DC

## 2023-11-03 MED ORDER — CHLORDIAZEPOXIDE HCL 25 MG PO CAPS: 25.0000 mg | ORAL_CAPSULE | Freq: Two times a day (BID) | ORAL | Status: DC

## 2023-11-03 NOTE — TOC CAGE-AID Note (Signed)
 Transition of Care [facility]) - CAGE-AID Screening   Patient Details  Name: Gabriel Koch MRN: [id number] Date of Birth: 1966/01/08  Transition of Care [facility]) CM/SW Contact:    Dionna Wiedemann E Maybelle Depaoli, LCSW Phone Number: 11/03/2023, 11:53 AM   Clinical Narrative: Patient reports alcohol use of 3-6 drinks per day. Patient states he is "cutting back now." Patient unsure if he would like SA resources - he was agreeable to resources being added to his AVS to use if he desires.   CAGE-AID Screening:    Have You Ever Felt You Ought to Cut Down on Your Drinking or Drug Use?: Yes Have People Annoyed You By Critizing Your Drinking Or Drug Use?: No Have You Felt Bad Or Guilty About Your Drinking Or Drug Use?: Yes Have You Ever Had a Drink or Used Drugs First Thing In The Morning to Steady Your Nerves or to Get Rid of a Hangover?: No CAGE-AID Score: 2  Substance Abuse Education Offered: Yes  Substance abuse interventions: Transport planner

## 2023-11-03 NOTE — Progress Notes (Signed)
 Per lab result WBC=1.3, md notified via secure chat

## 2023-11-03 NOTE — TOC Initial Note (Signed)
 Transition of Care [facility]) - Initial/Assessment Note    Patient Details  Name: Gabriel Koch MRN: [id number] Date of Birth: 12-Oct-1965  Transition of Care [facility]) CM/SW Contact:    Isidore Margraf E Takiyah Bohnsack, LCSW Phone Number: 11/03/2023, 11:54 AM  Clinical Narrative:                 Met with patient at bedside. Patient lives alone. Patient states he has friends who can help him/provide transportation when available. Patient states he uses Medicaid transportation to appointments. PCP is Dr. Lewayne Records. Patient denied HH/SNF/CIR/DME history.   Expected Discharge Plan: Home/Self Care Barriers to Discharge: Continued Medical Work up   Patient Goals and CMS Choice   CMS Medicare.gov Compare Post Acute Care list provided to:: Patient Choice offered to / list presented to : Patient      Expected Discharge Plan and Services       Living arrangements for the past 2 months: Single Family Home                                      Prior Living Arrangements/Services Living arrangements for the past 2 months: Single Family Home Lives with:: Self Patient language and need for interpreter reviewed:: Yes Do you feel safe going back to the place where you live?: Yes      Need for Family Participation in Patient Care: Yes (Comment) Care giver support system in place?: No (comment)   Criminal Activity/Legal Involvement Pertinent to Current Situation/Hospitalization: No - Comment as needed  Activities of Daily Living   ADL Screening (condition at time of admission) Independently performs ADLs?: No Does the patient have a NEW difficulty with bathing/dressing/toileting/self-feeding that is expected to last >3 days?: Yes (Initiates electronic notice to provider for possible OT consult) Does the patient have a NEW difficulty with getting in/out of bed, walking, or climbing stairs that is expected to last >3 days?: No Does the patient have a NEW difficulty with communication that is expected to  last >3 days?: No Is the patient deaf or have difficulty hearing?: No Does the patient have difficulty seeing, even when wearing glasses/contacts?: No Does the patient have difficulty concentrating, remembering, or making decisions?: No  Permission Sought/Granted Permission sought to share information with : Facility Industrial/product designer granted to share information with : Yes, Verbal Permission Granted     Permission granted to share info w AGENCY: as needed        Emotional Assessment         Alcohol / Substance Use: Alcohol Use Psych Involvement: No (comment)  Admission diagnosis:  Subarachnoid hemorrhage (HCC) [I60.9] Hyponatremia [E87.1] Closed fracture of transverse process of lumbar vertebra, initial encounter (HCC) [S32.009A] Patient Active Problem List   Diagnosis Date Noted   Subarachnoid hemorrhage (HCC) 11/02/2023   Falls 11/02/2023   Alcohol intoxication (HCC) 11/02/2023   Colitis 01/11/2023   Generalized abdominal pain 12/27/2022   Rib deformity 12/27/2022   Pancytopenia (HCC) 12/27/2022   Loss of weight 12/27/2022   Diarrhea 12/27/2022   Prolapsed internal hemorrhoids, grade 2 03/26/2022   Barrett's esophagus 03/03/2022   Hyponatremia 12/14/2021   Discoloration of skin of flank resembling ecchymosis 12/14/2021   Coronary artery calcification 12/14/2021   Tobacco abuse 04/30/2020   Alcohol abuse 04/30/2020   Noncompliance with diet and medication regimen 04/30/2020   Left arm numbness 04/29/2020   Prolonged QT interval 04/29/2020   Hypokalemia  04/29/2020   Hypomagnesemia 04/29/2020   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 04/29/2020   Facial weakness 04/28/2020   HTN (hypertension) 01/20/2020   GERD (gastroesophageal reflux disease) 01/19/2020   Vomiting 01/19/2020   Rectal bleeding 01/19/2020   Alternating constipation and diarrhea 01/19/2020   PCP:  Kathyleen Parkins, MD Pharmacy:   [facility] - [place], [place] - [address] [place] [address] Phone: [phone] Fax: [phone]     Social Drivers of Health (SDOH) Social History: SDOH Screenings   Food Insecurity: No Food Insecurity (11/02/2023)  Housing: Low Risk  (11/02/2023)  Transportation Needs: No Transportation Needs (11/02/2023)  Utilities: Not At Risk (11/02/2023)  Tobacco Use: High Risk (11/02/2023)   SDOH Interventions:     Readmission Risk Interventions     No data to display

## 2023-11-03 NOTE — Progress Notes (Signed)
 PROGRESS NOTE    Gabriel Koch  LKG:[id number] DOB: 1966/03/29 DOA: 11/02/2023 PCP: Kathyleen Parkins, MD   Brief Narrative:  Gabriel Koch is a 58 y.o. male with medical history significant for alcohol abuse, anxiety and depression, stroke, COPD, hypertension.  Patient presented to the ED with complaints of falls, head and rib pain after a fall on 4/27 (48 hours prior to presentation to the ED) as well as again on the morning of the 27th.  Imaging and intake concerning for focal acute subarachnoid hemorrhage in the left sylvian fissure without midline shift or edema.  Also notable left L1 and L2 transverse process fractures, possible L3 transverse process fracture.  Hospitalist called for admission, neurosurgery called in consult.  Repeat CT per neurosurgery generally unchanged from prior per radiology.  Initiate Keppra x 7 days for prophylaxis.  Neuro otherwise recommending outpatient evaluation and imaging in their office.  Given patient's profound ambulatory dysfunction, unsafe disposition home PT OT have been consulted to follow along.  Patient had marked difficulty today ambulating even short distances to the bathroom.  Discussed possible disposition to SNF.  Assessment & Plan:   Principal Problem:   Subarachnoid hemorrhage (HCC) Active Problems:   Hyponatremia   Falls   Alcohol intoxication (HCC)   Pancytopenia (HCC)   HTN (hypertension)   Hypomagnesemia   Tobacco abuse   Subarachnoid hemorrhage (HCC) -Secondary to mechanical fall in the setting of intoxication with alcohol - Repeat CT head generally unchanged from prior with notable acute subarachnoid hemorrhage of the left sylvian fissure. - Appreciate neurosurgery insight recommendations, continue conservative medical management at this time, no indication at this time for further imaging or procedures. - Keppra 500 mg twice daily for 7 days per neurosurgery   Acute alcohol intoxication (HCC) Acute toxic  encephalopathy -Profound history of alcohol use and abuse -averaging likely more than 12 drinks per day; has been drinking since he was 58 years old -Initiate Librium  taper, extend to a 6-day taper(rather than usual 3-day taper) given patient's history of profound alcohol use and timing - CIWA protocol, multivitamins per protocol otherwise no signs or symptoms of withdrawal thus far -Patient has no indication he is wanting to quit drinking -Hold home diazepam dosing -educated the patient on risks of concurrent benzodiazepine use and alcohol use  Acute on chronic ambulatory dysfunction with frequent falls  Traumatic injuries as above  - See above in regards to trauma, neurosurgery following along, imaging stable - PT OT to follow   Hypovolemic hyponatremia versus beer potomania -Sodium baseline ranging 129-134 chronically, 122 at intake - Currently 128, continue to follow   Moderate/Severe protein caloric malnutrition Hypomagnesemia Pancytopenia (HCC) Near baseline, likely secondary to chronic alcohol use and abuse and malnutrition Replete magnesium /continue vitamin supplementation   Tobacco abuse Nicotine  patch.     HTN (hypertension) Stable. Hold ARB  DVT prophylaxis: SCDs Start: 11/02/23 2130 Code Status:   Code Status: Full Code Family Communication: None present  Status is: Inpt  Dispo: The patient is from: Home              Anticipated d/c is to: TBD              Anticipated d/c date is: 48-72h              Patient currently NOT medically stable for discharge  Consultants:  Neurosurgery  Procedures:  None  Antimicrobials:  None   Subjective: No acute issues/events overnight - continues to complain of pain, but  moderately well controlled  Objective: Vitals:   11/02/23 2027 11/02/23 2143 11/02/23 2325 11/03/23 0300  BP: (!) 154/84  120/61 (!) 154/87  Pulse: (!) 102  (!) 108 (!) 107  Resp: 16  14 17   Temp:  (!) 97.3 F (36.3 C) 98.7 F (37.1 C) 99.4 F  (37.4 C)  TempSrc:  Oral Oral Oral  SpO2: 99%  99% 98%  Weight:    59.2 kg  Height:    5\' 8"  (1.727 m)    Intake/Output Summary (Last 24 hours) at 11/03/2023 0729 Last data filed at 11/03/2023 0500 Gross per 24 hour  Intake 1217.1 ml  Output --  Net 1217.1 ml   Filed Weights   11/02/23 1331 11/03/23 0300  Weight: 60.7 kg 59.2 kg    Examination:  General exam: Appears calm and comfortable  Respiratory system: Clear to auscultation. Respiratory effort normal. Cardiovascular system: S1 & S2 heard, RRR. No JVD, murmurs, rubs, gallops or clicks. No pedal edema. Gastrointestinal system: Abdomen is nondistended, soft and nontender. No organomegaly or masses felt. Normal bowel sounds heard. Central nervous system: Alert and oriented. No focal neurological deficits. Extremities: Symmetric 5 x 5 power. Skin: No rashes, lesions or ulcers Psychiatry: Judgement and insight appear normal. Mood & affect appropriate.     Data Reviewed: I have personally reviewed following labs and imaging studies  CBC: Recent Labs  Lab 11/02/23 1445 11/03/23 0536  WBC 2.3* 1.3*  NEUTROABS 1.3*  --   HGB 9.5* 8.5*  HCT 26.4* 23.6*  MCV 110.0* 109.8*  PLT 70* 52*   Basic Metabolic Panel: Recent Labs  Lab 11/02/23 1538 11/02/23 1810  NA 122*  --   K 4.0  --   CL 91*  --   CO2 19*  --   GLUCOSE 86  --   BUN <5*  --   CREATININE 0.73  --   CALCIUM  8.5*  --   MG  --  1.4*  PHOS  --  3.6   GFR: Estimated Creatinine Clearance: 85.3 mL/min (by C-G formula based on SCr of 0.73 mg/dL). Liver Function Tests: Recent Labs  Lab 11/02/23 1538  AST 134*  ALT 36  ALKPHOS 271*  BILITOT 1.5*  PROT 7.1  ALBUMIN 2.9*   No results for input(s): "LIPASE", "AMYLASE" in the last 168 hours. No results for input(s): "AMMONIA" in the last 168 hours. Coagulation Profile: No results for input(s): "INR", "PROTIME" in the last 168 hours. Cardiac Enzymes: No results for input(s): "CKTOTAL", "CKMB",  "CKMBINDEX", "TROPONINI" in the last 168 hours. BNP (last 3 results) No results for input(s): "PROBNP" in the last 8760 hours. HbA1C: No results for input(s): "HGBA1C" in the last 72 hours. CBG: Recent Labs  Lab 11/02/23 2215  GLUCAP 89   Lipid Profile: No results for input(s): "CHOL", "HDL", "LDLCALC", "TRIG", "CHOLHDL", "LDLDIRECT" in the last 72 hours. Thyroid  Function Tests: No results for input(s): "TSH", "T4TOTAL", "FREET4", "T3FREE", "THYROIDAB" in the last 72 hours. Anemia Panel: No results for input(s): "VITAMINB12", "FOLATE", "FERRITIN", "TIBC", "IRON", "RETICCTPCT" in the last 72 hours. Sepsis Labs: No results for input(s): "PROCALCITON", "LATICACIDVEN" in the last 168 hours.  No results found for this or any previous visit (from the past 240 hours).       Radiology Studies: CT Head Wo Contrast Result Date: 11/02/2023 CLINICAL DATA:  Follow-up examination for intracranial hemorrhage. EXAM: CT HEAD WITHOUT CONTRAST TECHNIQUE: Contiguous axial images were obtained from the base of the skull through the vertex without intravenous contrast.  RADIATION DOSE REDUCTION: This exam was performed according to the departmental dose-optimization program which includes automated exposure control, adjustment of the mA and/or kV according to patient size and/or use of iterative reconstruction technique. COMPARISON:  CT from earlier the same day. FINDINGS: Brain: Previously identified hemorrhage positioned at the superior aspect of the left sylvian fissure again seen, not significantly changed in size or appearance from prior. No significant surrounding edema. No other new acute intracranial hemorrhage. No acute large vessel territory infarct. No mass lesion or midline shift. No hydrocephalus or extra-axial fluid collection. Underlying atrophy with chronic small vessel ischemic disease noted. Vascular: No abnormal hyperdense vessel. Calcified atherosclerosis present at the skull base. Skull:  Evolving left periorbital/facial soft tissue contusion again noted. Calvarium intact. Sinuses/Orbits: Globes orbital soft tissues within normal limits. Visualized paranasal sinuses and mastoid air cells are clear. Other: None. IMPRESSION: 1. Stable exam. No significant change in size or appearance of small volume hemorrhage positioned at the superior aspect of the left Sylvian fissure. 2. No other new acute intracranial abnormality. 3. Underlying atrophy with chronic small vessel ischemic disease. Electronically Signed   By: Virgia Griffins M.D.   On: 11/02/2023 23:57   CT Head Wo Contrast Result Date: 11/02/2023 CLINICAL DATA:  Head trauma, intoxication, recent fall with headache and rib pain. EXAM: CT HEAD WITHOUT CONTRAST CT MAXILLOFACIAL WITHOUT CONTRAST CT CERVICAL SPINE WITHOUT CONTRAST TECHNIQUE: Multidetector CT imaging of the head, cervical spine, and maxillofacial structures were performed using the standard protocol without intravenous contrast. Multiplanar CT image reconstructions of the cervical spine and maxillofacial structures were also generated. RADIATION DOSE REDUCTION: This exam was performed according to the departmental dose-optimization program which includes automated exposure control, adjustment of the mA and/or kV according to patient size and/or use of iterative reconstruction technique. COMPARISON:  MRI head 04/29/2020. FINDINGS: CT HEAD FINDINGS Brain: Focal acute subarachnoid hemorrhage involving the left sylvian fissure. No additional areas of intracranial hemorrhage appreciated. No significant mass effect or midline shift. The basilar cisterns are patent. Nonspecific hypoattenuation in the periventricular and subcortical white matter favored to reflect chronic microvascular ischemic changes. Remote lacunar infarct in the right thalamus. Mild parenchymal volume loss. Ventricles: Prominence of the ventricles suggesting underlying parenchymal volume loss. Vascular:  Atherosclerotic calcifications of the carotid siphons. No hyperdense vessel. Skull: No acute or aggressive finding. Other: Mastoid air cells are clear. CT MAXILLOFACIAL FINDINGS Osseous: Maxilla: Intact. Maxilla is edentulous with associated chronic bone loss. Pterygoid Plates: Intact Zygomatic Arch: Intact Orbits: Intact Ethmoid: Intact Sphenoid: Intact Frontal:Intact Mandible: Intact.  No condylar dislocation.  Edentulous. Nasal: Mildly displaced fracture of the right nasal bone. Nondisplaced left nasal bone fracture. Nasal Septum: Intact. No significant deviation. Orbits: Globes are intact. Lenses are normally located. Extraocular muscles and optic nerve sheath complexes are unremarkable. Normal appearance of the retro bulbar fat. There is left periorbital soft tissue swelling with swelling of the upper and lower eyelids. Preseptal soft tissue swelling is most pronounced over the lateral aspect of the orbit without evidence of postseptal involvement. Sinuses: Minimal mucosal thickening in the maxillary sinuses. No air-fluid levels. Soft tissues: Left facial soft tissue swelling overlying the maxillary sinus and zygomatic arch. CT CERVICAL SPINE FINDINGS Alignment: Straightening of the normal cervical lordosis. No listhesis. No facet subluxation or dislocation. Skull base and vertebrae: No compression fracture or displaced fracture in the cervical spine. No suspicious osseous lesion. Soft tissues and spinal canal: No prevertebral fluid or swelling. No visible canal hematoma. Disc levels: Moderate disc space narrowing  at C5-6. Disc osteophyte complex at C5-6 eccentric to the left without high-grade osseous spinal canal stenosis. There is mild-to-moderate foraminal narrowing on the left at C5-6. Upper chest: No acute abnormality. Other: None. Traumatic Brain Injury Risk Stratification Skull Fracture: No - Low/mBIG 1 Subdural Hematoma (SDH): No - Low Subarachnoid Hemorrhage [facility]): trace (up to 2 sulci) - Low/mBIG 1  Epidural Hematoma (EDH): No - Low/mBIG 1 Cerebral contusion, intra-axial, intraparenchymal Hemorrhage (IPH): No Intraventricular Hemorrhage (IVH): No - Low/mBIG 1 Midline Shift > 1mm or Edema/effacement of sulci/vents: No - Low/mBIG 1 ---------------------------------------------------- IMPRESSION: Focal acute subarachnoid hemorrhage in the left sylvian fissure. No associated edema or midline shift. No acute fracture or traumatic malalignment of the cervical spine. Bilateral nasal bone fractures as above. On review of prior CT from 2021 these are likely chronic. Left periorbital and left facial soft tissue swelling. Chronic microvascular ischemic changes and mild parenchymal volume loss. Remote lacunar infarct in the right thalamus. Finding of acute subarachnoid hemorrhage called by telephone at the time of interpretation on 11/02/2023 at 5:43 pm to provider Judie Noun , who verbally acknowledged these results. Electronically Signed   By: Denny Flack M.D.   On: 11/02/2023 17:44   CT Cervical Spine Wo Contrast Result Date: 11/02/2023 CLINICAL DATA:  Head trauma, intoxication, recent fall with headache and rib pain. EXAM: CT HEAD WITHOUT CONTRAST CT MAXILLOFACIAL WITHOUT CONTRAST CT CERVICAL SPINE WITHOUT CONTRAST TECHNIQUE: Multidetector CT imaging of the head, cervical spine, and maxillofacial structures were performed using the standard protocol without intravenous contrast. Multiplanar CT image reconstructions of the cervical spine and maxillofacial structures were also generated. RADIATION DOSE REDUCTION: This exam was performed according to the departmental dose-optimization program which includes automated exposure control, adjustment of the mA and/or kV according to patient size and/or use of iterative reconstruction technique. COMPARISON:  MRI head 04/29/2020. FINDINGS: CT HEAD FINDINGS Brain: Focal acute subarachnoid hemorrhage involving the left sylvian fissure. No additional areas of intracranial  hemorrhage appreciated. No significant mass effect or midline shift. The basilar cisterns are patent. Nonspecific hypoattenuation in the periventricular and subcortical white matter favored to reflect chronic microvascular ischemic changes. Remote lacunar infarct in the right thalamus. Mild parenchymal volume loss. Ventricles: Prominence of the ventricles suggesting underlying parenchymal volume loss. Vascular: Atherosclerotic calcifications of the carotid siphons. No hyperdense vessel. Skull: No acute or aggressive finding. Other: Mastoid air cells are clear. CT MAXILLOFACIAL FINDINGS Osseous: Maxilla: Intact. Maxilla is edentulous with associated chronic bone loss. Pterygoid Plates: Intact Zygomatic Arch: Intact Orbits: Intact Ethmoid: Intact Sphenoid: Intact Frontal:Intact Mandible: Intact.  No condylar dislocation.  Edentulous. Nasal: Mildly displaced fracture of the right nasal bone. Nondisplaced left nasal bone fracture. Nasal Septum: Intact. No significant deviation. Orbits: Globes are intact. Lenses are normally located. Extraocular muscles and optic nerve sheath complexes are unremarkable. Normal appearance of the retro bulbar fat. There is left periorbital soft tissue swelling with swelling of the upper and lower eyelids. Preseptal soft tissue swelling is most pronounced over the lateral aspect of the orbit without evidence of postseptal involvement. Sinuses: Minimal mucosal thickening in the maxillary sinuses. No air-fluid levels. Soft tissues: Left facial soft tissue swelling overlying the maxillary sinus and zygomatic arch. CT CERVICAL SPINE FINDINGS Alignment: Straightening of the normal cervical lordosis. No listhesis. No facet subluxation or dislocation. Skull base and vertebrae: No compression fracture or displaced fracture in the cervical spine. No suspicious osseous lesion. Soft tissues and spinal canal: No prevertebral fluid or swelling. No visible canal hematoma. Disc levels:  Moderate disc space  narrowing at C5-6. Disc osteophyte complex at C5-6 eccentric to the left without high-grade osseous spinal canal stenosis. There is mild-to-moderate foraminal narrowing on the left at C5-6. Upper chest: No acute abnormality. Other: None. Traumatic Brain Injury Risk Stratification Skull Fracture: No - Low/mBIG 1 Subdural Hematoma (SDH): No - Low Subarachnoid Hemorrhage [facility]): trace (up to 2 sulci) - Low/mBIG 1 Epidural Hematoma (EDH): No - Low/mBIG 1 Cerebral contusion, intra-axial, intraparenchymal Hemorrhage (IPH): No Intraventricular Hemorrhage (IVH): No - Low/mBIG 1 Midline Shift > 1mm or Edema/effacement of sulci/vents: No - Low/mBIG 1 ---------------------------------------------------- IMPRESSION: Focal acute subarachnoid hemorrhage in the left sylvian fissure. No associated edema or midline shift. No acute fracture or traumatic malalignment of the cervical spine. Bilateral nasal bone fractures as above. On review of prior CT from 2021 these are likely chronic. Left periorbital and left facial soft tissue swelling. Chronic microvascular ischemic changes and mild parenchymal volume loss. Remote lacunar infarct in the right thalamus. Finding of acute subarachnoid hemorrhage called by telephone at the time of interpretation on 11/02/2023 at 5:43 pm to provider Judie Noun , who verbally acknowledged these results. Electronically Signed   By: Denny Flack M.D.   On: 11/02/2023 17:44   CT Maxillofacial WO CM Result Date: 11/02/2023 CLINICAL DATA:  Head trauma, intoxication, recent fall with headache and rib pain. EXAM: CT HEAD WITHOUT CONTRAST CT MAXILLOFACIAL WITHOUT CONTRAST CT CERVICAL SPINE WITHOUT CONTRAST TECHNIQUE: Multidetector CT imaging of the head, cervical spine, and maxillofacial structures were performed using the standard protocol without intravenous contrast. Multiplanar CT image reconstructions of the cervical spine and maxillofacial structures were also generated. RADIATION DOSE REDUCTION: This  exam was performed according to the departmental dose-optimization program which includes automated exposure control, adjustment of the mA and/or kV according to patient size and/or use of iterative reconstruction technique. COMPARISON:  MRI head 04/29/2020. FINDINGS: CT HEAD FINDINGS Brain: Focal acute subarachnoid hemorrhage involving the left sylvian fissure. No additional areas of intracranial hemorrhage appreciated. No significant mass effect or midline shift. The basilar cisterns are patent. Nonspecific hypoattenuation in the periventricular and subcortical white matter favored to reflect chronic microvascular ischemic changes. Remote lacunar infarct in the right thalamus. Mild parenchymal volume loss. Ventricles: Prominence of the ventricles suggesting underlying parenchymal volume loss. Vascular: Atherosclerotic calcifications of the carotid siphons. No hyperdense vessel. Skull: No acute or aggressive finding. Other: Mastoid air cells are clear. CT MAXILLOFACIAL FINDINGS Osseous: Maxilla: Intact. Maxilla is edentulous with associated chronic bone loss. Pterygoid Plates: Intact Zygomatic Arch: Intact Orbits: Intact Ethmoid: Intact Sphenoid: Intact Frontal:Intact Mandible: Intact.  No condylar dislocation.  Edentulous. Nasal: Mildly displaced fracture of the right nasal bone. Nondisplaced left nasal bone fracture. Nasal Septum: Intact. No significant deviation. Orbits: Globes are intact. Lenses are normally located. Extraocular muscles and optic nerve sheath complexes are unremarkable. Normal appearance of the retro bulbar fat. There is left periorbital soft tissue swelling with swelling of the upper and lower eyelids. Preseptal soft tissue swelling is most pronounced over the lateral aspect of the orbit without evidence of postseptal involvement. Sinuses: Minimal mucosal thickening in the maxillary sinuses. No air-fluid levels. Soft tissues: Left facial soft tissue swelling overlying the maxillary sinus and  zygomatic arch. CT CERVICAL SPINE FINDINGS Alignment: Straightening of the normal cervical lordosis. No listhesis. No facet subluxation or dislocation. Skull base and vertebrae: No compression fracture or displaced fracture in the cervical spine. No suspicious osseous lesion. Soft tissues and spinal canal: No prevertebral fluid or swelling. No visible  canal hematoma. Disc levels: Moderate disc space narrowing at C5-6. Disc osteophyte complex at C5-6 eccentric to the left without high-grade osseous spinal canal stenosis. There is mild-to-moderate foraminal narrowing on the left at C5-6. Upper chest: No acute abnormality. Other: None. Traumatic Brain Injury Risk Stratification Skull Fracture: No - Low/mBIG 1 Subdural Hematoma (SDH): No - Low Subarachnoid Hemorrhage [facility]): trace (up to 2 sulci) - Low/mBIG 1 Epidural Hematoma (EDH): No - Low/mBIG 1 Cerebral contusion, intra-axial, intraparenchymal Hemorrhage (IPH): No Intraventricular Hemorrhage (IVH): No - Low/mBIG 1 Midline Shift > 1mm or Edema/effacement of sulci/vents: No - Low/mBIG 1 ---------------------------------------------------- IMPRESSION: Focal acute subarachnoid hemorrhage in the left sylvian fissure. No associated edema or midline shift. No acute fracture or traumatic malalignment of the cervical spine. Bilateral nasal bone fractures as above. On review of prior CT from 2021 these are likely chronic. Left periorbital and left facial soft tissue swelling. Chronic microvascular ischemic changes and mild parenchymal volume loss. Remote lacunar infarct in the right thalamus. Finding of acute subarachnoid hemorrhage called by telephone at the time of interpretation on 11/02/2023 at 5:43 pm to provider Judie Noun , who verbally acknowledged these results. Electronically Signed   By: Denny Flack M.D.   On: 11/02/2023 17:44   CT CHEST ABDOMEN PELVIS W CONTRAST Result Date: 11/02/2023 CLINICAL DATA:  Abdominal trauma, blunt EXAM: CT CHEST, ABDOMEN, AND  PELVIS WITH CONTRAST TECHNIQUE: Multidetector CT imaging of the chest, abdomen and pelvis was performed following the standard protocol during bolus administration of intravenous contrast. RADIATION DOSE REDUCTION: This exam was performed according to the departmental dose-optimization program which includes automated exposure control, adjustment of the mA and/or kV according to patient size and/or use of iterative reconstruction technique. CONTRAST:  OMNIPAQUE  IOHEXOL  300 MG/ML  SOLN COMPARISON:  CT abdomen pelvis 05/13/2023 FINDINGS: CHEST: Cardiovascular: No aortic injury. The thoracic aorta is normal in caliber. The heart is normal in size. No significant pericardial effusion. Moderate severe atherosclerotic plaque. At least 3 vessel coronary artery calcification. Mediastinum/Nodes: No pneumomediastinum. No mediastinal hematoma. The esophagus is unremarkable. The thyroid  is unremarkable. The central airways are patent. No mediastinal, hilar, or axillary lymphadenopathy. Lungs/Pleura: No focal consolidation. No pulmonary nodule. No pulmonary mass. No pulmonary contusion or laceration. No pneumatocele formation. No pleural effusion. No pneumothorax. No hemothorax. Musculoskeletal/Chest wall: No chest wall mass. Old healed nonunionized left posterior rib fractures. No acute rib or sternal fracture. No spinal fracture. ABDOMEN / PELVIS: Hepatobiliary: Not enlarged. No focal lesion. No laceration or subcapsular hematoma. The gallbladder is otherwise unremarkable with no radio-opaque gallstones. No biliary ductal dilatation. Pancreas: Normal pancreatic contour. No main pancreatic duct dilatation. Spleen: Not enlarged. No focal lesion. No laceration, subcapsular hematoma, or vascular injury. Adrenals/Urinary Tract: No nodularity bilaterally. Bilateral kidneys enhance symmetrically. No hydronephrosis. No contusion, laceration, or subcapsular hematoma. No injury to the vascular structures or collecting systems. No  hydroureter. The urinary bladder is unremarkable. Stomach/Bowel: No small or large bowel wall thickening or dilatation. Colonic diverticulosis. The appendix is unremarkable. Vasculature/Lymphatics: Severe atherosclerotic plaque. No abdominal aorta or iliac aneurysm. No active contrast extravasation or pseudoaneurysm. No abdominal, pelvic, inguinal lymphadenopathy. Reproductive: Prostate is unremarkable. Other: No simple free fluid ascites. No pneumoperitoneum. No hemoperitoneum. No mesenteric hematoma identified. No organized fluid collection. Musculoskeletal: No significant soft tissue hematoma. No acute pelvic fracture. Acute displaced left L1 and L2 transverse process fractures. Possible nondisplaced L3 left transverse process fracture. Other ports and devices: None. IMPRESSION: 1. Acute displaced left L1 and L2 transverse process fractures.  2. Possible nondisplaced acute left L3 transverse process fracture. 3. No acute intrathoracic, intra-abdominal, intrapelvic traumatic injury. 4. No acute fracture or traumatic malalignment of the thoracic spine. 5.  Aortic Atherosclerosis (ICD10-I70.0). Electronically Signed   By: Morgane  Naveau M.D.   On: 11/02/2023 17:36        Scheduled Meds:  buPROPion   150 mg Oral BID   busPIRone   10 mg Oral BID   folic acid   1 mg Oral Daily   levETIRAcetam  500 mg Oral Q12H   multivitamin with minerals  1 tablet Oral Daily   nicotine   14 mg Transdermal Daily   pantoprazole   40 mg Oral Daily   thiamine   100 mg Oral Daily   Or   thiamine   100 mg Intravenous Daily   traZODone   100 mg Oral QHS   Continuous Infusions:  sodium chloride  100 mL/hr at 11/02/23 2147     LOS: 1 day   Time spent:  Haydee Lipa, DO [facility]  If 7PM-7AM, please contact night-coverage [email or web address]  11/03/2023, 7:29 AM

## 2023-11-03 NOTE — Plan of Care (Signed)

## 2023-11-03 NOTE — Discharge Instructions (Signed)
In a time of Crisis: [facility]  Mobile Crisis Management provides immediate crisis response, 24/7.  Call [phone]  [facility] for MH/DD/SA [facility] is available 24 hours a day, 7 days a week. Customer Service Specialists will assist you to find a crisis provider that is well-matched with your needs. Your local number is: [phone]  [facility] ([facility]) IOP, individual counseling, medication management [address] [place], [place] [address] [phone] Call for intake hours; Medicaid and Uninsured    Outpatient Providers  [facility] ([facility]) Group and individual counseling. [address]  [place], [place] [address] [phone] [place]: [phone]  [place]: [phone] Medicaid and uninsured.   [facility] Offers IOP groups multiple times per week. [address] [place], [place] [address] [phone] Takes Medicaid and other insurances.   [facility]  Chemical Dependency Intensive Outpatient Program (IOP) [address] [place], [place] [address] [phone] Takes Nurse, learning disability and [place].   [facility]  IOP and Partial Hospitalization Program  [address]  [place], [place] [address] [phone] Private Insurance, [place] only for partial hospitalization  [facility] [address], [place], [place] [address] [phone] Monday-Friday. Short and Long term options. [facility] ([facility]) IOP, individual counseling, medication management [address] [place], [place] [address] [phone] Medicaid and [facility]  [facility] [address]  [place], [place] [address] [phone] Private Insurance and Self Pay   [facility] [address]  [place], [place] [address] [phone] Private Insurance, [place], and Self Pay   [facility]  [address] [place], [place] [address] [facility]  [address]  [place], [place] [address] [phone]  [facility]  [address] [place], [place] [address] [phone]  [facility] [phone] [facility] and [facility]      Residential Treatment Programs  [facility] [address] [place], [place] [address] [phone] or [phone] Detox and Residential Rehab 14 days (Medicare, Medicaid, private insurance, and self pay). No methadone. Call for pre-screen.   [facility]) [address]  [place], [place] [address] [phone] Detox (self Pay and Medicaid Limited availability) Rehab Only Male (Medicare, Medicaid, and Self Pay)-No methadone.  [facility] [address] [place], [place] [address] [phone] or [phone] Private Insurance only  [facility] [address] [place], [place] [address] Phone:  [phone] Must be detoxed 72 hours prior to admission; 28 day program.  Self-pay.  [facility] [address]  [place], [place] [phone] [employer], Medicare, [place] (not straight [place]). They offer assistance with transportation.   [facility] [address] [place],  [place], [place] [address] [phone] Christian Based Program. Men only. No insurance  [facility] [address] [place], [place] [address] Women's: [phone] Men's: [phone] No Medicaid.   [facility] [place] Locations across the [place] (mainly [place]) willing to help with transportation.  [phone] [employer]. [facility]  [address]  [place], [place] [address] [phone] Treatment Only, must make  assessment appointment, and must be sober for assessment appointment. Self pay, Medicare A and B, [facility],  must be [facility] resident. No methadone.   [address] [place], [place] [address] Phone: [phone] Inpatient 24/7 and outpatient services. Individuals with Medicaid have no obligation for a copay. Individuals with Medicare or private insurance will be obligated to meet their policy's requirement(s). Individuals who are uninsured will be eligible for a sliding or discounted scaled  [facility]  [address] [place], [place] [address] [phone] No pending legal charges, Long-term work program. No methadone. Call for assessment.  [facility]  [address], [place], [place] [address] [phone] or [phone] Commercial Insurance Only  [facility] - [phone] Private Insurance (no [place]). Males/Females, call to make referrals, multiple facilities   [facility] [address]  [place], [place] [address]  [phone] Men Only Upfront Fee   [facility]-no methadone [facility] [address] [place], [place] [address] [phone] (f)         AA Meetings Website to locate meetings (virtually or in person): [email or web address]/ Phone: [phone]  Syringe Services Program: Due to COVID-19, syringe services programs are likely operating under different hours with limited or no fixed site hours. Some programs may not be operating at all. Please contact the program directly using the phone numbers provided below to see if they are still operating under COVID-19.  [facility] Solution to the Opioid Problem ([facility]) Fixed; mobile; peer-based Roxy Cedar [phone] [email or web address] Fixed site exchange at [facility], [address] [place], [place] [address] on Wednesdays (2:00 - 5:00 pm) and Thursdays  (4:00 - 8:00 pm). Pop-up mobile exchange locations: [employer], [address] [place], [place] [address] on Tuesdays (11:00 am - 1:00 pm) and Fridays (11:00 am - 1:00 pm)  -[facility] - [address], [place], [place] [address] on Tuesdays (2:00 - 4:00 pm) and Fridays (2:00 - 4:00 pm)  -[facility] copy - also serves Radio broadcast assistant and [employer] [place] [employer]; Fixed; mobile; peer-based; [place] Ka [phone] [email or web address] [address] [place], [place] [address] Delivery and outreach available in [place] and [place], please call for more information. Monday, Tuesday: 1:00 -7:00 pm, Thursday: 4:00 pm - 8:00 pm or Friday: 1:00 pm - 8:00 pm  Medication-Assisted Treatment (MAT):  -[facility]- services [address] and surrounding areas including [place], [place], [place], [place], [address], [place], [place], [place], [place], and [place], [place]. Options include Methadone, buprenorphine or Suboxone. [address], [place], [place] [address] Phone: [phone] Mon - Fri: 5:30am - 2:00pm Sat: 5:30am -7:30am Sun: Closed Holidays: 6:00am - 8:00am  -[facility]- We use FDA-approved medications, like methadone/suboxone/sublocade, and vivitrol. These medications are then combined with customized care plans that include individual or group counseling, toxicology, and medical care directed by on-site physicians. Accepts most insurance plans, Medicaid, and private pay.  [address] [place], [place] [address] Phone: [phone] Monday-Friday 5:00 AM - 10:00 AM Saturday 6:00 AM - 8:30 AM Sunday 6:00 AM - 7:00 AM  -[facility]- [facility] is a treatment & recovery focused program. In addition to receiving methadone medication, our clients participate in individual and group counseling as well as random drug testing. If accepted into the [facility] Opioid Program, you  will be provided several intake appointments and a physical exam [address] [place], [place] [address] Office: [phone]  Fax: [phone]  -[facility]- We put our community members at the  center of everything we do, for remote treatment services as well as in-person, from alcohol withdrawal to opioid use and more.  [address], [place], [place] [address] [phone] Monday-Wednesday: 9:00am - 5:00pm Thursday: 9:00am - 6:00pm Friday: 9:00am - 5:00pm Saturday: 9:00am - 1:00pm Sunday: Closed  -[facility] [place]) [address], [place], [place] [address] [phone]  [place] [phone] [address] [place], [place] [address]  M-W    5:00am-12:00pm Thu     5:00am-10:00am Fri       5:00am-12:00pm Sat      5:00am-8:00am Sun     Closed  $12/daily for Methadone Treatment.

## 2023-11-04 DIAGNOSIS — I609 Nontraumatic subarachnoid hemorrhage, unspecified: Secondary | ICD-10-CM | POA: Diagnosis not present

## 2023-11-04 LAB — COMPREHENSIVE METABOLIC PANEL WITH GFR
ALT: 31 U/L (ref 0–44)
AST: 95 U/L — ABNORMAL HIGH (ref 15–41)
Albumin: 2.4 g/dL — ABNORMAL LOW (ref 3.5–5.0)
Alkaline Phosphatase: 251 U/L — ABNORMAL HIGH (ref 38–126)
Anion gap: 9 (ref 5–15)
BUN: <5 mg/dL — ABNORMAL LOW (ref 6–20)
CO2: 21 mmol/L — ABNORMAL LOW (ref 22–32)
Calcium: 8.5 mg/dL — ABNORMAL LOW (ref 8.9–10.3)
Chloride: 100 mmol/L (ref 98–111)
Creatinine, Ser: 0.83 mg/dL (ref 0.61–1.24)
GFR, Estimated: >60 mL/min (ref >60)
Glucose, Bld: 101 mg/dL — ABNORMAL HIGH (ref 70–99)
Potassium: 3.8 mmol/L (ref 3.5–5.1)
Sodium: 130 mmol/L — ABNORMAL LOW (ref 135–145)
Total Bilirubin: 2.1 mg/dL — ABNORMAL HIGH (ref 0.0–1.2)
Total Protein: 6.2 g/dL — ABNORMAL LOW (ref 6.5–8.1)

## 2023-11-04 LAB — CBC
HCT: 22.2 % — ABNORMAL LOW (ref 39.0–52.0)
Hemoglobin: 7.9 g/dL — ABNORMAL LOW (ref 13.0–17.0)
MCH: 40.3 pg — ABNORMAL HIGH (ref 26.0–34.0)
MCHC: 35.6 g/dL (ref 30.0–36.0)
MCV: 113.3 fL — ABNORMAL HIGH (ref 80.0–100.0)
Platelets: 51 10*3/uL — ABNORMAL LOW (ref 150–400)
RBC: 1.96 MIL/uL — ABNORMAL LOW (ref 4.22–5.81)
RDW: 16.2 % — ABNORMAL HIGH (ref 11.5–15.5)
WBC: 1.6 10*3/uL — ABNORMAL LOW (ref 4.0–10.5)
nRBC: 0 % (ref 0.0–0.2)

## 2023-11-04 MED ORDER — LIDOCAINE 5 % EX PTCH
2.0000 | MEDICATED_PATCH | CUTANEOUS | Status: DC
  Administered 2023-11-04 – 2023-11-05 (×2): 2 via TRANSDERMAL
  Filled 2023-11-04 (×2): qty 2

## 2023-11-04 NOTE — Evaluation (Signed)
 Occupational Therapy Evaluation Patient Details Name: Gabriel Koch MRN: [id number] DOB: [birth year] Today's Date: 11/04/2023   History of Present Illness   Pt is a 58 y.o. M who presents 11/02/2023 with complaints of falls. Imaging concerning for focal acute SAH in left sylvian fissure without midline shift or edema. Also notable for left L1 and L2 transverse process fractures, possible L3 transverse process fracture; recommendation for outpatient neurosurgery follow up. Significant PMH: alcohol abuse, anxiety, depression, stroke, COPD, HTN.     Clinical Impressions Pt is independent in ADLs and IADLs typically and walks intermittently with a cane and reports frequent falls. Pt presents with head and back pain, impaired standing balance and poor endurance. He requires supervision with RW for ambulation and ADLs. Recommending HHOT upon discharge.      If plan is discharge home, recommend the following:   Assistance with cooking/housework;Assist for transportation;Help with stairs or ramp for entrance     Functional Status Assessment   Patient has had a recent decline in their functional status and demonstrates the ability to make significant improvements in function in a reasonable and predictable amount of time.     Equipment Recommendations   Tub/shower seat     Recommendations for Other Services         Precautions/Restrictions   Precautions Precautions: Fall Restrictions Weight Bearing Restrictions Per Provider Order: No     Mobility Bed Mobility Overal bed mobility: Modified Independent                  Transfers Overall transfer level: Needs assistance Equipment used: Rolling walker (2 wheels) Transfers: Sit to/from Stand Sit to Stand: Supervision                  Balance Overall balance assessment: Mild deficits observed, not formally tested                                         ADL either performed or assessed  with clinical judgement   ADL Overall ADL's : Needs assistance/impaired Eating/Feeding: Independent   Grooming: Wash/dry hands;Standing;Supervision/safety   Upper Body Bathing: Set up;Sitting   Lower Body Bathing: Sit to/from stand;Supervison/ safety   Upper Body Dressing : Set up;Sitting   Lower Body Dressing: Sit to/from stand;Supervision/safety   Toilet Transfer: Ambulation;Comfort height toilet;Rolling walker (2 wheels);Supervision/safety   Toileting- Clothing Manipulation and Hygiene: Independent;Sitting/lateral lean       Functional mobility during ADLs: Rolling walker (2 wheels);Supervision/safety       Vision Patient Visual Report: No change from baseline Additional Comments: wears glasses, they are not at the hospital     Perception         Praxis         Pertinent Vitals/Pain Pain Assessment Pain Assessment: Faces Faces Pain Scale: Hurts little more Pain Location: head, back Pain Descriptors / Indicators: Discomfort Pain Intervention(s): Monitored during session, Premedicated before session, Repositioned     Extremity/Trunk Assessment Upper Extremity Assessment Upper Extremity Assessment: Overall WFL for tasks assessed   Lower Extremity Assessment Lower Extremity Assessment: Defer to PT evaluation   Cervical / Trunk Assessment Cervical / Trunk Assessment: Other exceptions Cervical / Trunk Exceptions: back pain   Communication Communication Communication: Impaired Factors Affecting Communication: Hearing impaired   Cognition Arousal: Alert Behavior During Therapy: Flat affect Cognition: Cognition impaired       Memory impairment (select all impairments): Short-term  memory (reports this is baseline)     OT - Cognition Comments: slow response speed                 Following commands: Intact       Cueing  General Comments          Exercises     Shoulder Instructions      Home Living Family/patient expects to be  discharged to:: Private residence Living Arrangements: Alone Available Help at Discharge: Family;Available PRN/intermittently Type of Home: House Home Access: Stairs to enter [employer] of Steps: 2 (2 + cinderblccks) Entrance Stairs-Rails: None Home Layout: One level     Bathroom Shower/Tub: Chief Strategy Officer: Standard     Home Equipment: Cane - single point          Prior Functioning/Environment Prior Level of Function : Needs assist             Mobility Comments: using cane intermittently, reports hx of falls ADLs Comments: Does not have a driver's license. independent ADL's, goes over to aunt's house for laundry, uses Medicaid transport. Cousin will assist him with transportation occasionally and get groceries.    OT Problem List: Impaired balance (sitting and/or standing);Pain   OT Treatment/Interventions: Self-care/ADL training;DME and/or AE instruction;Therapeutic activities;Patient/family education;Balance training      OT Goals(Current goals can be found in the care plan section)   Acute Rehab OT Goals OT Goal Formulation: With patient Time For Goal Achievement: 11/18/23 Potential to Achieve Goals: Good ADL Goals Pt Will Perform Grooming: with modified independence;standing Pt Will Perform Lower Body Bathing: with modified independence;sit to/from stand Pt Will Perform Lower Body Dressing: with modified independence;sit to/from stand Pt Will Transfer to Toilet: ambulating;regular height toilet   OT Frequency:  Min 2X/week    Co-evaluation              AM-PAC OT "6 Clicks" Daily Activity     Outcome Measure Help from another person eating meals?: None Help from another person taking care of personal grooming?: A Little Help from another person toileting, which includes using toliet, bedpan, or urinal?: A Little Help from another person bathing (including washing, rinsing, drying)?: A Little Help from another person  to put on and taking off regular upper body clothing?: A Little Help from another person to put on and taking off regular lower body clothing?: A Little 6 Click Score: 19   End of Session Equipment Utilized During Treatment: Rolling walker (2 wheels);Gait belt  Activity Tolerance: Patient tolerated treatment well Patient left: in bed;with call bell/phone within reach;with bed alarm set  OT Visit Diagnosis: Unsteadiness on feet (R26.81);Other abnormalities of gait and mobility (R26.89);Pain                Time: 1610-9604 OT Time Calculation (min): 28 min Charges:  OT General Charges $OT Visit: 1 Visit OT Evaluation $OT Eval Low Complexity: 1 Low OT Treatments $Self Care/Home Management : 8-22 mins Avanell Leigh, OTR/L Acute Rehabilitation Services Office: [phone]  Jonette Nestle 11/04/2023, 11:31 AM

## 2023-11-04 NOTE — Progress Notes (Signed)
 PROGRESS NOTE    Gabriel Koch  ZOX:[id number] DOB: 08-Oct-1965 DOA: 11/02/2023 PCP: Kathyleen Parkins, MD   Brief Narrative:  Gabriel Koch is a 58 y.o. male with medical history significant for alcohol abuse, anxiety and depression, stroke, COPD, hypertension.  Patient presented to the ED with complaints of falls, head and rib pain after a fall on 4/27 (48 hours prior to presentation to the ED) as well as again on the morning of the 27th.  Imaging and intake concerning for focal acute subarachnoid hemorrhage in the left sylvian fissure without midline shift or edema.  Also notable left L1 and L2 transverse process fractures, possible L3 transverse process fracture.  Hospitalist called for admission, neurosurgery called in consult.  Repeat CT per neurosurgery generally unchanged from prior per radiology.  Initiate Keppra  x 7 days for prophylaxis.  Neuro otherwise recommending outpatient evaluation and imaging in their office.  Given patient's profound ambulatory dysfunction, unsafe disposition home PT OT have been consulted to follow along.  Patient had marked difficulty today ambulating even short distances to the bathroom.  Discussed possible disposition to SNF.  Assessment & Plan:   Principal Problem:   Subarachnoid hemorrhage (HCC) Active Problems:   Hyponatremia   Falls   Alcohol intoxication (HCC)   Pancytopenia (HCC)   HTN (hypertension)   Hypomagnesemia   Tobacco abuse  Subarachnoid hemorrhage (HCC) -Secondary to mechanical fall in the setting of intoxication with alcohol - Repeat CT head generally unchanged from prior with notable acute subarachnoid hemorrhage of the left sylvian fissure. - Appreciate neurosurgery insight recommendations, continue conservative medical management at this time, no indication at this time for further imaging or procedures. - Keppra  500 mg twice daily for 7 days per neurosurgery   Acute alcohol intoxication (HCC) Acute toxic  encephalopathy -Profound history of alcohol use and abuse -averaging likely more than 12 drinks per day; has been drinking since he was 58 years old -Initiate Librium  taper, extend to a 6-day taper(rather than usual 3-day taper) given patient's history of profound alcohol use and timing - CIWA protocol, multivitamins per protocol otherwise no signs or symptoms of withdrawal thus far -Patient has no indication he is wanting to quit drinking -Hold home diazepam  dosing -educated the patient on risks of concurrent benzodiazepine use and alcohol use  Acute on chronic ambulatory dysfunction with frequent falls  Traumatic injuries as above  - See above in regards to trauma, neurosurgery following along, imaging stable - PT OT following - plan for dispo to SNF pending recovery   Hypovolemic hyponatremia versus beer potomania -Sodium baseline ranging 129-134 chronically, 122 at intake - Currently 130, continue to follow   Moderate/Severe protein caloric malnutrition Hypomagnesemia Pancytopenia (HCC) Near baseline, likely secondary to chronic alcohol use and abuse and malnutrition Replete magnesium /continue vitamin supplementation   Tobacco abuse Nicotine  patch.     HTN (hypertension) Stable. Hold ARB  DVT prophylaxis: SCDs Start: 11/02/23 2130 Code Status:   Code Status: Full Code Family Communication: None present  Status is: Inpt  Dispo: The patient is from: Home              Anticipated d/c is to: TBD              Anticipated d/c date is: 48-72h              Patient currently NOT medically stable for discharge  Consultants:  Neurosurgery  Procedures:  None  Antimicrobials:  None   Subjective: No acute issues/events overnight -  continues to complain of pain, but moderately well controlled  Objective: Vitals:   11/03/23 1600 11/03/23 1943 11/03/23 2253 11/04/23 0253  BP:  (!) 140/73 119/63 114/62  Pulse: 96 (!) 107 (!) 108 78  Resp:  16 19 14   Temp:  99 F (37.2 C)  99.4 F (37.4 C) 99 F (37.2 C)  TempSrc:  Oral Oral Oral  SpO2:  94% 95% 100%  Weight:      Height:        Intake/Output Summary (Last 24 hours) at 11/04/2023 0705 Last data filed at 11/03/2023 2253 Gross per 24 hour  Intake 240 ml  Output --  Net 240 ml   Filed Weights   11/02/23 1331 11/03/23 0300  Weight: 60.7 kg 59.2 kg    Examination:  General exam: Appears calm and comfortable  Respiratory system: Clear to auscultation. Respiratory effort normal. Cardiovascular system: S1 & S2 heard, RRR. No JVD, murmurs, rubs, gallops or clicks. No pedal edema. Gastrointestinal system: Abdomen is nondistended, soft and nontender. No organomegaly or masses felt. Normal bowel sounds heard. Central nervous system: Alert and oriented. No focal neurological deficits. Extremities: Symmetric 5 x 5 power. Skin: No rashes, lesions or ulcers Psychiatry: Judgement and insight appear normal. Mood & affect appropriate.     Data Reviewed: I have personally reviewed following labs and imaging studies  CBC: Recent Labs  Lab 11/02/23 1445 11/03/23 0536 11/04/23 0601  WBC 2.3* 1.3* 1.6*  NEUTROABS 1.3*  --   --   HGB 9.5* 8.5* 7.9*  HCT 26.4* 23.6* 22.2*  MCV 110.0* 109.8* 113.3*  PLT 70* 52* 51*   Basic Metabolic Panel: Recent Labs  Lab 11/02/23 1538 11/02/23 1810 11/03/23 0536 11/04/23 0601  NA 122*  --  128* 130*  K 4.0  --  4.2 3.8  CL 91*  --  100 100  CO2 19*  --  20* 21*  GLUCOSE 86  --  92 101*  BUN <5*  --  <5* <5*  CREATININE 0.73  --  0.74 0.83  CALCIUM  8.5*  --  8.2* 8.5*  MG  --  1.4* 1.8  --   PHOS  --  3.6  --   --    GFR: Estimated Creatinine Clearance: 82.2 mL/min (by C-G formula based on SCr of 0.83 mg/dL). Liver Function Tests: Recent Labs  Lab 11/02/23 1538 11/04/23 0601  AST 134* 95*  ALT 36 31  ALKPHOS 271* 251*  BILITOT 1.5* 2.1*  PROT 7.1 6.2*  ALBUMIN 2.9* 2.4*   No results for input(s): "LIPASE", "AMYLASE" in the last 168 hours. No results  for input(s): "AMMONIA" in the last 168 hours. Coagulation Profile: No results for input(s): "INR", "PROTIME" in the last 168 hours. Cardiac Enzymes: No results for input(s): "CKTOTAL", "CKMB", "CKMBINDEX", "TROPONINI" in the last 168 hours. BNP (last 3 results) No results for input(s): "PROBNP" in the last 8760 hours. HbA1C: No results for input(s): "HGBA1C" in the last 72 hours. CBG: Recent Labs  Lab 11/02/23 2215  GLUCAP 89   Lipid Profile: No results for input(s): "CHOL", "HDL", "LDLCALC", "TRIG", "CHOLHDL", "LDLDIRECT" in the last 72 hours. Thyroid  Function Tests: No results for input(s): "TSH", "T4TOTAL", "FREET4", "T3FREE", "THYROIDAB" in the last 72 hours. Anemia Panel: No results for input(s): "VITAMINB12", "FOLATE", "FERRITIN", "TIBC", "IRON", "RETICCTPCT" in the last 72 hours. Sepsis Labs: No results for input(s): "PROCALCITON", "LATICACIDVEN" in the last 168 hours.  No results found for this or any previous visit (from the past 240  hours).       Radiology Studies: CT Head Wo Contrast Result Date: 11/02/2023 CLINICAL DATA:  Follow-up examination for intracranial hemorrhage. EXAM: CT HEAD WITHOUT CONTRAST TECHNIQUE: Contiguous axial images were obtained from the base of the skull through the vertex without intravenous contrast. RADIATION DOSE REDUCTION: This exam was performed according to the departmental dose-optimization program which includes automated exposure control, adjustment of the mA and/or kV according to patient size and/or use of iterative reconstruction technique. COMPARISON:  CT from earlier the same day. FINDINGS: Brain: Previously identified hemorrhage positioned at the superior aspect of the left sylvian fissure again seen, not significantly changed in size or appearance from prior. No significant surrounding edema. No other new acute intracranial hemorrhage. No acute large vessel territory infarct. No mass lesion or midline shift. No hydrocephalus or  extra-axial fluid collection. Underlying atrophy with chronic small vessel ischemic disease noted. Vascular: No abnormal hyperdense vessel. Calcified atherosclerosis present at the skull base. Skull: Evolving left periorbital/facial soft tissue contusion again noted. Calvarium intact. Sinuses/Orbits: Globes orbital soft tissues within normal limits. Visualized paranasal sinuses and mastoid air cells are clear. Other: None. IMPRESSION: 1. Stable exam. No significant change in size or appearance of small volume hemorrhage positioned at the superior aspect of the left Sylvian fissure. 2. No other new acute intracranial abnormality. 3. Underlying atrophy with chronic small vessel ischemic disease. Electronically Signed   By: Virgia Griffins M.D.   On: 11/02/2023 23:57   CT Head Wo Contrast Result Date: 11/02/2023 CLINICAL DATA:  Head trauma, intoxication, recent fall with headache and rib pain. EXAM: CT HEAD WITHOUT CONTRAST CT MAXILLOFACIAL WITHOUT CONTRAST CT CERVICAL SPINE WITHOUT CONTRAST TECHNIQUE: Multidetector CT imaging of the head, cervical spine, and maxillofacial structures were performed using the standard protocol without intravenous contrast. Multiplanar CT image reconstructions of the cervical spine and maxillofacial structures were also generated. RADIATION DOSE REDUCTION: This exam was performed according to the departmental dose-optimization program which includes automated exposure control, adjustment of the mA and/or kV according to patient size and/or use of iterative reconstruction technique. COMPARISON:  MRI head 04/29/2020. FINDINGS: CT HEAD FINDINGS Brain: Focal acute subarachnoid hemorrhage involving the left sylvian fissure. No additional areas of intracranial hemorrhage appreciated. No significant mass effect or midline shift. The basilar cisterns are patent. Nonspecific hypoattenuation in the periventricular and subcortical white matter favored to reflect chronic microvascular  ischemic changes. Remote lacunar infarct in the right thalamus. Mild parenchymal volume loss. Ventricles: Prominence of the ventricles suggesting underlying parenchymal volume loss. Vascular: Atherosclerotic calcifications of the carotid siphons. No hyperdense vessel. Skull: No acute or aggressive finding. Other: Mastoid air cells are clear. CT MAXILLOFACIAL FINDINGS Osseous: Maxilla: Intact. Maxilla is edentulous with associated chronic bone loss. Pterygoid Plates: Intact Zygomatic Arch: Intact Orbits: Intact Ethmoid: Intact Sphenoid: Intact Frontal:Intact Mandible: Intact.  No condylar dislocation.  Edentulous. Nasal: Mildly displaced fracture of the right nasal bone. Nondisplaced left nasal bone fracture. Nasal Septum: Intact. No significant deviation. Orbits: Globes are intact. Lenses are normally located. Extraocular muscles and optic nerve sheath complexes are unremarkable. Normal appearance of the retro bulbar fat. There is left periorbital soft tissue swelling with swelling of the upper and lower eyelids. Preseptal soft tissue swelling is most pronounced over the lateral aspect of the orbit without evidence of postseptal involvement. Sinuses: Minimal mucosal thickening in the maxillary sinuses. No air-fluid levels. Soft tissues: Left facial soft tissue swelling overlying the maxillary sinus and zygomatic arch. CT CERVICAL SPINE FINDINGS Alignment: Straightening of the normal  cervical lordosis. No listhesis. No facet subluxation or dislocation. Skull base and vertebrae: No compression fracture or displaced fracture in the cervical spine. No suspicious osseous lesion. Soft tissues and spinal canal: No prevertebral fluid or swelling. No visible canal hematoma. Disc levels: Moderate disc space narrowing at C5-6. Disc osteophyte complex at C5-6 eccentric to the left without high-grade osseous spinal canal stenosis. There is mild-to-moderate foraminal narrowing on the left at C5-6. Upper chest: No acute  abnormality. Other: None. Traumatic Brain Injury Risk Stratification Skull Fracture: No - Low/mBIG 1 Subdural Hematoma (SDH): No - Low Subarachnoid Hemorrhage [facility]): trace (up to 2 sulci) - Low/mBIG 1 Epidural Hematoma (EDH): No - Low/mBIG 1 Cerebral contusion, intra-axial, intraparenchymal Hemorrhage (IPH): No Intraventricular Hemorrhage (IVH): No - Low/mBIG 1 Midline Shift > 1mm or Edema/effacement of sulci/vents: No - Low/mBIG 1 ---------------------------------------------------- IMPRESSION: Focal acute subarachnoid hemorrhage in the left sylvian fissure. No associated edema or midline shift. No acute fracture or traumatic malalignment of the cervical spine. Bilateral nasal bone fractures as above. On review of prior CT from 2021 these are likely chronic. Left periorbital and left facial soft tissue swelling. Chronic microvascular ischemic changes and mild parenchymal volume loss. Remote lacunar infarct in the right thalamus. Finding of acute subarachnoid hemorrhage called by telephone at the time of interpretation on 11/02/2023 at 5:43 pm to provider Judie Noun , who verbally acknowledged these results. Electronically Signed   By: Denny Flack M.D.   On: 11/02/2023 17:44   CT Cervical Spine Wo Contrast Result Date: 11/02/2023 CLINICAL DATA:  Head trauma, intoxication, recent fall with headache and rib pain. EXAM: CT HEAD WITHOUT CONTRAST CT MAXILLOFACIAL WITHOUT CONTRAST CT CERVICAL SPINE WITHOUT CONTRAST TECHNIQUE: Multidetector CT imaging of the head, cervical spine, and maxillofacial structures were performed using the standard protocol without intravenous contrast. Multiplanar CT image reconstructions of the cervical spine and maxillofacial structures were also generated. RADIATION DOSE REDUCTION: This exam was performed according to the departmental dose-optimization program which includes automated exposure control, adjustment of the mA and/or kV according to patient size and/or use of iterative  reconstruction technique. COMPARISON:  MRI head 04/29/2020. FINDINGS: CT HEAD FINDINGS Brain: Focal acute subarachnoid hemorrhage involving the left sylvian fissure. No additional areas of intracranial hemorrhage appreciated. No significant mass effect or midline shift. The basilar cisterns are patent. Nonspecific hypoattenuation in the periventricular and subcortical white matter favored to reflect chronic microvascular ischemic changes. Remote lacunar infarct in the right thalamus. Mild parenchymal volume loss. Ventricles: Prominence of the ventricles suggesting underlying parenchymal volume loss. Vascular: Atherosclerotic calcifications of the carotid siphons. No hyperdense vessel. Skull: No acute or aggressive finding. Other: Mastoid air cells are clear. CT MAXILLOFACIAL FINDINGS Osseous: Maxilla: Intact. Maxilla is edentulous with associated chronic bone loss. Pterygoid Plates: Intact Zygomatic Arch: Intact Orbits: Intact Ethmoid: Intact Sphenoid: Intact Frontal:Intact Mandible: Intact.  No condylar dislocation.  Edentulous. Nasal: Mildly displaced fracture of the right nasal bone. Nondisplaced left nasal bone fracture. Nasal Septum: Intact. No significant deviation. Orbits: Globes are intact. Lenses are normally located. Extraocular muscles and optic nerve sheath complexes are unremarkable. Normal appearance of the retro bulbar fat. There is left periorbital soft tissue swelling with swelling of the upper and lower eyelids. Preseptal soft tissue swelling is most pronounced over the lateral aspect of the orbit without evidence of postseptal involvement. Sinuses: Minimal mucosal thickening in the maxillary sinuses. No air-fluid levels. Soft tissues: Left facial soft tissue swelling overlying the maxillary sinus and zygomatic arch. CT CERVICAL SPINE FINDINGS Alignment:  Straightening of the normal cervical lordosis. No listhesis. No facet subluxation or dislocation. Skull base and vertebrae: No compression  fracture or displaced fracture in the cervical spine. No suspicious osseous lesion. Soft tissues and spinal canal: No prevertebral fluid or swelling. No visible canal hematoma. Disc levels: Moderate disc space narrowing at C5-6. Disc osteophyte complex at C5-6 eccentric to the left without high-grade osseous spinal canal stenosis. There is mild-to-moderate foraminal narrowing on the left at C5-6. Upper chest: No acute abnormality. Other: None. Traumatic Brain Injury Risk Stratification Skull Fracture: No - Low/mBIG 1 Subdural Hematoma (SDH): No - Low Subarachnoid Hemorrhage [facility]): trace (up to 2 sulci) - Low/mBIG 1 Epidural Hematoma (EDH): No - Low/mBIG 1 Cerebral contusion, intra-axial, intraparenchymal Hemorrhage (IPH): No Intraventricular Hemorrhage (IVH): No - Low/mBIG 1 Midline Shift > 1mm or Edema/effacement of sulci/vents: No - Low/mBIG 1 ---------------------------------------------------- IMPRESSION: Focal acute subarachnoid hemorrhage in the left sylvian fissure. No associated edema or midline shift. No acute fracture or traumatic malalignment of the cervical spine. Bilateral nasal bone fractures as above. On review of prior CT from 2021 these are likely chronic. Left periorbital and left facial soft tissue swelling. Chronic microvascular ischemic changes and mild parenchymal volume loss. Remote lacunar infarct in the right thalamus. Finding of acute subarachnoid hemorrhage called by telephone at the time of interpretation on 11/02/2023 at 5:43 pm to provider Judie Noun , who verbally acknowledged these results. Electronically Signed   By: Denny Flack M.D.   On: 11/02/2023 17:44   CT Maxillofacial WO CM Result Date: 11/02/2023 CLINICAL DATA:  Head trauma, intoxication, recent fall with headache and rib pain. EXAM: CT HEAD WITHOUT CONTRAST CT MAXILLOFACIAL WITHOUT CONTRAST CT CERVICAL SPINE WITHOUT CONTRAST TECHNIQUE: Multidetector CT imaging of the head, cervical spine, and maxillofacial structures  were performed using the standard protocol without intravenous contrast. Multiplanar CT image reconstructions of the cervical spine and maxillofacial structures were also generated. RADIATION DOSE REDUCTION: This exam was performed according to the departmental dose-optimization program which includes automated exposure control, adjustment of the mA and/or kV according to patient size and/or use of iterative reconstruction technique. COMPARISON:  MRI head 04/29/2020. FINDINGS: CT HEAD FINDINGS Brain: Focal acute subarachnoid hemorrhage involving the left sylvian fissure. No additional areas of intracranial hemorrhage appreciated. No significant mass effect or midline shift. The basilar cisterns are patent. Nonspecific hypoattenuation in the periventricular and subcortical white matter favored to reflect chronic microvascular ischemic changes. Remote lacunar infarct in the right thalamus. Mild parenchymal volume loss. Ventricles: Prominence of the ventricles suggesting underlying parenchymal volume loss. Vascular: Atherosclerotic calcifications of the carotid siphons. No hyperdense vessel. Skull: No acute or aggressive finding. Other: Mastoid air cells are clear. CT MAXILLOFACIAL FINDINGS Osseous: Maxilla: Intact. Maxilla is edentulous with associated chronic bone loss. Pterygoid Plates: Intact Zygomatic Arch: Intact Orbits: Intact Ethmoid: Intact Sphenoid: Intact Frontal:Intact Mandible: Intact.  No condylar dislocation.  Edentulous. Nasal: Mildly displaced fracture of the right nasal bone. Nondisplaced left nasal bone fracture. Nasal Septum: Intact. No significant deviation. Orbits: Globes are intact. Lenses are normally located. Extraocular muscles and optic nerve sheath complexes are unremarkable. Normal appearance of the retro bulbar fat. There is left periorbital soft tissue swelling with swelling of the upper and lower eyelids. Preseptal soft tissue swelling is most pronounced over the lateral aspect of the  orbit without evidence of postseptal involvement. Sinuses: Minimal mucosal thickening in the maxillary sinuses. No air-fluid levels. Soft tissues: Left facial soft tissue swelling overlying the maxillary sinus and zygomatic arch. CT  CERVICAL SPINE FINDINGS Alignment: Straightening of the normal cervical lordosis. No listhesis. No facet subluxation or dislocation. Skull base and vertebrae: No compression fracture or displaced fracture in the cervical spine. No suspicious osseous lesion. Soft tissues and spinal canal: No prevertebral fluid or swelling. No visible canal hematoma. Disc levels: Moderate disc space narrowing at C5-6. Disc osteophyte complex at C5-6 eccentric to the left without high-grade osseous spinal canal stenosis. There is mild-to-moderate foraminal narrowing on the left at C5-6. Upper chest: No acute abnormality. Other: None. Traumatic Brain Injury Risk Stratification Skull Fracture: No - Low/mBIG 1 Subdural Hematoma (SDH): No - Low Subarachnoid Hemorrhage [facility]): trace (up to 2 sulci) - Low/mBIG 1 Epidural Hematoma (EDH): No - Low/mBIG 1 Cerebral contusion, intra-axial, intraparenchymal Hemorrhage (IPH): No Intraventricular Hemorrhage (IVH): No - Low/mBIG 1 Midline Shift > 1mm or Edema/effacement of sulci/vents: No - Low/mBIG 1 ---------------------------------------------------- IMPRESSION: Focal acute subarachnoid hemorrhage in the left sylvian fissure. No associated edema or midline shift. No acute fracture or traumatic malalignment of the cervical spine. Bilateral nasal bone fractures as above. On review of prior CT from 2021 these are likely chronic. Left periorbital and left facial soft tissue swelling. Chronic microvascular ischemic changes and mild parenchymal volume loss. Remote lacunar infarct in the right thalamus. Finding of acute subarachnoid hemorrhage called by telephone at the time of interpretation on 11/02/2023 at 5:43 pm to provider Judie Noun , who verbally acknowledged these  results. Electronically Signed   By: Denny Flack M.D.   On: 11/02/2023 17:44   CT CHEST ABDOMEN PELVIS W CONTRAST Result Date: 11/02/2023 CLINICAL DATA:  Abdominal trauma, blunt EXAM: CT CHEST, ABDOMEN, AND PELVIS WITH CONTRAST TECHNIQUE: Multidetector CT imaging of the chest, abdomen and pelvis was performed following the standard protocol during bolus administration of intravenous contrast. RADIATION DOSE REDUCTION: This exam was performed according to the departmental dose-optimization program which includes automated exposure control, adjustment of the mA and/or kV according to patient size and/or use of iterative reconstruction technique. CONTRAST:  OMNIPAQUE  IOHEXOL  300 MG/ML  SOLN COMPARISON:  CT abdomen pelvis 05/13/2023 FINDINGS: CHEST: Cardiovascular: No aortic injury. The thoracic aorta is normal in caliber. The heart is normal in size. No significant pericardial effusion. Moderate severe atherosclerotic plaque. At least 3 vessel coronary artery calcification. Mediastinum/Nodes: No pneumomediastinum. No mediastinal hematoma. The esophagus is unremarkable. The thyroid  is unremarkable. The central airways are patent. No mediastinal, hilar, or axillary lymphadenopathy. Lungs/Pleura: No focal consolidation. No pulmonary nodule. No pulmonary mass. No pulmonary contusion or laceration. No pneumatocele formation. No pleural effusion. No pneumothorax. No hemothorax. Musculoskeletal/Chest wall: No chest wall mass. Old healed nonunionized left posterior rib fractures. No acute rib or sternal fracture. No spinal fracture. ABDOMEN / PELVIS: Hepatobiliary: Not enlarged. No focal lesion. No laceration or subcapsular hematoma. The gallbladder is otherwise unremarkable with no radio-opaque gallstones. No biliary ductal dilatation. Pancreas: Normal pancreatic contour. No main pancreatic duct dilatation. Spleen: Not enlarged. No focal lesion. No laceration, subcapsular hematoma, or vascular injury.  Adrenals/Urinary Tract: No nodularity bilaterally. Bilateral kidneys enhance symmetrically. No hydronephrosis. No contusion, laceration, or subcapsular hematoma. No injury to the vascular structures or collecting systems. No hydroureter. The urinary bladder is unremarkable. Stomach/Bowel: No small or large bowel wall thickening or dilatation. Colonic diverticulosis. The appendix is unremarkable. Vasculature/Lymphatics: Severe atherosclerotic plaque. No abdominal aorta or iliac aneurysm. No active contrast extravasation or pseudoaneurysm. No abdominal, pelvic, inguinal lymphadenopathy. Reproductive: Prostate is unremarkable. Other: No simple free fluid ascites. No pneumoperitoneum. No hemoperitoneum. No mesenteric hematoma  identified. No organized fluid collection. Musculoskeletal: No significant soft tissue hematoma. No acute pelvic fracture. Acute displaced left L1 and L2 transverse process fractures. Possible nondisplaced L3 left transverse process fracture. Other ports and devices: None. IMPRESSION: 1. Acute displaced left L1 and L2 transverse process fractures. 2. Possible nondisplaced acute left L3 transverse process fracture. 3. No acute intrathoracic, intra-abdominal, intrapelvic traumatic injury. 4. No acute fracture or traumatic malalignment of the thoracic spine. 5.  Aortic Atherosclerosis (ICD10-I70.0). Electronically Signed   By: Morgane  Naveau M.D.   On: 11/02/2023 17:36        Scheduled Meds:  buPROPion   150 mg Oral BID   busPIRone   10 mg Oral BID   chlordiazePOXIDE   25 mg Oral TID   Followed by   Cecily Cohen ON 11/05/2023] chlordiazePOXIDE   25 mg Oral BID   Followed by   Cecily Cohen ON 11/07/2023] chlordiazePOXIDE   25 mg Oral Q1500   folic acid   1 mg Oral Daily   levETIRAcetam   500 mg Oral Q12H   magnesium  chloride  1 tablet Oral BID   multivitamin with minerals  1 tablet Oral Daily   nicotine   14 mg Transdermal Daily   pantoprazole   40 mg Oral Daily   thiamine   100 mg Oral Daily   Or    thiamine   100 mg Intravenous Daily   traZODone   100 mg Oral QHS   Continuous Infusions:     LOS: 2 days   Time spent:  Haydee Lipa, DO [facility]  If 7PM-7AM, please contact night-coverage [email or web address]  11/04/2023, 7:05 AM

## 2023-11-04 NOTE — Evaluation (Signed)
 Physical Therapy Evaluation Patient Details Name: Gabriel Koch MRN: [id number] DOB: 06-02-1966 Today's Date: 11/04/2023  History of Present Illness  Pt is a 58 y.o. M who presents 11/02/2023 with complaints of falls. Imaging concerning for focal acute SAH in left sylvian fissure without midline shift or edema. Also notable for left L1 and L2 transverse process fractures, possible L3 transverse process fracture; recommendation for outpatient neurosurgery follow up. Significant PMH: alcohol abuse, anxiety, depression, stroke, COPD, HTN.  Clinical Impression  Pt admitted with above. PTA, pt lives alone and is independent with ADL's/mobility. Pt uses Medicaid or cousin for transportation. Pt endorses history of falls. Pt presents with decreased gait speed for age, cardiopulmonary endurance, and balance deficits. Benefits from use of assistive device (RW) for increased stability with ambulating limited hallway distance. HR peak 117 bpm. In setting of deficits and recurrent falls, recommend HHPT at d/c.       If plan is discharge home, recommend the following: Assist for transportation   Can travel by private vehicle        Equipment Recommendations Rolling walker (2 wheels)  Recommendations for Other Services       Functional Status Assessment Patient has had a recent decline in their functional status and demonstrates the ability to make significant improvements in function in a reasonable and predictable amount of time.     Precautions / Restrictions Precautions Precautions: Fall Restrictions Weight Bearing Restrictions Per Provider Order: No      Mobility  Bed Mobility               General bed mobility comments: OOB in bathroom with nursing upon arrival    Transfers Overall transfer level: Modified independent Equipment used: Rolling walker (2 wheels)                    Ambulation/Gait Ambulation/Gait assistance: Supervision Gait Distance (Feet): 70  Feet Assistive device: Rolling walker (2 wheels) Gait Pattern/deviations: Step-through pattern, Decreased stride length Gait velocity: decreased     General Gait Details: Slow and steady pace, supervision for safety  Stairs            Wheelchair Mobility     Tilt Bed    Modified Rankin (Stroke Patients Only)       Balance Overall balance assessment: Mild deficits observed, not formally tested                                           Pertinent Vitals/Pain Pain Assessment Pain Assessment: Faces Faces Pain Scale: Hurts a little bit Pain Location: back Pain Descriptors / Indicators: Discomfort Pain Intervention(s): Monitored during session    Home Living Family/patient expects to be discharged to:: Private residence Living Arrangements: Alone Available Help at Discharge: Family;Available PRN/intermittently Type of Home: House Home Access: Stairs to enter Entrance Stairs-Rails: None Entrance Stairs-Number of Steps: 2 (2, then "3 cinderblocks,")   Home Layout: One level Home Equipment: Cane - single point      Prior Function Prior Level of Function : Needs assist             Mobility Comments: using cane intermittently, reports hx of falls ADLs Comments: Does not have a driver's license. independent ADL's, goes over to aunt's house for laundry, uses Medicaid transport. Cousin will assist him with transportation occasionally and get groceries.     Extremity/Trunk Assessment   Upper  Extremity Assessment Upper Extremity Assessment: Defer to OT evaluation    Lower Extremity Assessment Lower Extremity Assessment: Overall WFL for tasks assessed    Cervical / Trunk Assessment Cervical / Trunk Assessment: Normal  Communication   Communication Communication: No apparent difficulties    Cognition Arousal: Alert Behavior During Therapy: WFL for tasks assessed/performed   PT - Cognitive impairments: Memory                        PT - Cognition Comments: Pt endorses memory deficits at baseline Following commands: Intact       Cueing       General Comments      Exercises     Assessment/Plan    PT Assessment Patient needs continued PT services  PT Problem List Decreased activity tolerance;Decreased balance;Decreased mobility       PT Treatment Interventions DME instruction;Gait training;Stair training;Functional mobility training;Therapeutic activities;Therapeutic exercise;Balance training;Patient/family education    PT Goals (Current goals can be found in the Care Plan section)  Acute Rehab PT Goals Patient Stated Goal: would like to stay sober PT Goal Formulation: With patient Time For Goal Achievement: 11/18/23 Potential to Achieve Goals: Good    Frequency Min 2X/week     Co-evaluation               AM-PAC PT "6 Clicks" Mobility  Outcome Measure Help needed turning from your back to your side while in a flat bed without using bedrails?: None Help needed moving from lying on your back to sitting on the side of a flat bed without using bedrails?: None Help needed moving to and from a bed to a chair (including a wheelchair)?: None Help needed standing up from a chair using your arms (e.g., wheelchair or bedside chair)?: None Help needed to walk in hospital room?: A Little Help needed climbing 3-5 steps with a railing? : A Little 6 Click Score: 22    End of Session Equipment Utilized During Treatment: Gait belt Activity Tolerance: Patient tolerated treatment well Patient left: with call bell/phone within reach;Other (comment) (in bathroom on toilet (instructed to press light when finished)) Nurse Communication: Mobility status;Other (comment) (pt in bathroom) PT Visit Diagnosis: Unsteadiness on feet (R26.81);History of falling (Z91.81)    Time: 2956-2130 PT Time Calculation (min) (ACUTE ONLY): 31 min   Charges:   PT Evaluation $PT Eval Low Complexity: 1 Low PT  Treatments $Therapeutic Activity: 8-22 mins PT General Charges $$ ACUTE PT VISIT: 1 Visit         Gabriel Koch, PT, DPT Acute Rehabilitation Services Office [phone]   Gabriel Koch 11/04/2023, 10:45 AM

## 2023-11-05 ENCOUNTER — Other Ambulatory Visit (HOSPITAL_COMMUNITY): Payer: Self-pay

## 2023-11-05 DIAGNOSIS — I609 Nontraumatic subarachnoid hemorrhage, unspecified: Secondary | ICD-10-CM | POA: Diagnosis not present

## 2023-11-05 LAB — COMPREHENSIVE METABOLIC PANEL WITH GFR
ALT: 27 U/L (ref 0–44)
AST: 63 U/L — ABNORMAL HIGH (ref 15–41)
Albumin: 2.4 g/dL — ABNORMAL LOW (ref 3.5–5.0)
Alkaline Phosphatase: 228 U/L — ABNORMAL HIGH (ref 38–126)
Anion gap: 9 (ref 5–15)
BUN: <5 mg/dL — ABNORMAL LOW (ref 6–20)
CO2: 23 mmol/L (ref 22–32)
Calcium: 8.5 mg/dL — ABNORMAL LOW (ref 8.9–10.3)
Chloride: 100 mmol/L (ref 98–111)
Creatinine, Ser: 0.79 mg/dL (ref 0.61–1.24)
GFR, Estimated: >60 mL/min (ref >60)
Glucose, Bld: 100 mg/dL — ABNORMAL HIGH (ref 70–99)
Potassium: 3.9 mmol/L (ref 3.5–5.1)
Sodium: 132 mmol/L — ABNORMAL LOW (ref 135–145)
Total Bilirubin: 1.6 mg/dL — ABNORMAL HIGH (ref 0.0–1.2)
Total Protein: 6.2 g/dL — ABNORMAL LOW (ref 6.5–8.1)

## 2023-11-05 LAB — CBC
HCT: 21.7 % — ABNORMAL LOW (ref 39.0–52.0)
Hemoglobin: 7.4 g/dL — ABNORMAL LOW (ref 13.0–17.0)
MCH: 38.7 pg — ABNORMAL HIGH (ref 26.0–34.0)
MCHC: 34.1 g/dL (ref 30.0–36.0)
MCV: 113.6 fL — ABNORMAL HIGH (ref 80.0–100.0)
Platelets: 57 10*3/uL — ABNORMAL LOW (ref 150–400)
RBC: 1.91 MIL/uL — ABNORMAL LOW (ref 4.22–5.81)
RDW: 16.3 % — ABNORMAL HIGH (ref 11.5–15.5)
WBC: 1.7 10*3/uL — ABNORMAL LOW (ref 4.0–10.5)
nRBC: 0 % (ref 0.0–0.2)

## 2023-11-05 MED ORDER — POLYETHYLENE GLYCOL 3350 17 GM/SCOOP PO POWD
17.0000 g | Freq: Every day | ORAL | 0 refills | Status: DC | PRN
  Filled 2023-11-05: qty 238, 14d supply, fill #0

## 2023-11-05 MED ORDER — NICOTINE 14 MG/24HR TD PT24
14.0000 mg | MEDICATED_PATCH | Freq: Every day | TRANSDERMAL | 0 refills | Status: DC
  Filled 2023-11-05: qty 28, 28d supply, fill #0

## 2023-11-05 MED ORDER — FOLIC ACID 1 MG PO TABS
1.0000 mg | ORAL_TABLET | Freq: Every day | ORAL | 0 refills | Status: DC
  Filled 2023-11-05: qty 30, 30d supply, fill #0

## 2023-11-05 MED ORDER — THIAMINE HCL 100 MG PO TABS
100.0000 mg | ORAL_TABLET | Freq: Every day | ORAL | 0 refills | Status: DC
  Filled 2023-11-05: qty 30, 30d supply, fill #0

## 2023-11-05 MED ORDER — HYDROCODONE-ACETAMINOPHEN 5-325 MG PO TABS
1.0000 | ORAL_TABLET | Freq: Four times a day (QID) | ORAL | 0 refills | Status: DC | PRN
  Filled 2023-11-05: qty 30, 7d supply, fill #0

## 2023-11-05 MED ORDER — MAGNESIUM CHLORIDE 64 MG PO TBEC
1.0000 | DELAYED_RELEASE_TABLET | Freq: Two times a day (BID) | ORAL | 0 refills | Status: AC
  Filled 2023-11-05: qty 8, 4d supply, fill #0

## 2023-11-05 MED ORDER — ACETAMINOPHEN 325 MG PO TABS
650.0000 mg | ORAL_TABLET | Freq: Four times a day (QID) | ORAL | 0 refills | Status: DC | PRN
  Filled 2023-11-05: qty 60, 8d supply, fill #0

## 2023-11-05 MED ORDER — CHLORDIAZEPOXIDE HCL 25 MG PO CAPS
ORAL_CAPSULE | ORAL | 0 refills | Status: AC
  Filled 2023-11-05: qty 6, 4d supply, fill #0

## 2023-11-05 MED ORDER — LEVETIRACETAM ER 500 MG PO TB24
500.0000 mg | ORAL_TABLET | Freq: Two times a day (BID) | ORAL | 0 refills | Status: DC
  Filled 2023-11-05: qty 12, 6d supply, fill #0

## 2023-11-05 MED ORDER — ADULT MULTIVITAMIN W/MINERALS CH
1.0000 | ORAL_TABLET | Freq: Every day | ORAL | 0 refills | Status: DC
Start: 2023-11-06 — End: 2024-02-02
  Filled 2023-11-05: qty 30, 30d supply, fill #0

## 2023-11-05 NOTE — Progress Notes (Signed)
 Occupational Therapy Treatment Patient Details Name: Gabriel Koch MRN: [id number] DOB: 1966-05-21 Today's Date: 11/05/2023   History of present illness Pt is a 58 y.o. M who presents 11/02/2023 with complaints of falls. Imaging concerning for focal acute SAH in left sylvian fissure without midline shift or edema. Also notable for left L1 and L2 transverse process fractures, possible L3 transverse process fracture; recommendation for outpatient neurosurgery follow up. Significant PMH: alcohol abuse, anxiety, depression, stroke, COPD, HTN.   OT comments  Patient received in supine and agreeable to OT treatment. Patient able to get to EOB with increased time and use of bed rails. Patient attempting to stand from EOB but unable to stand until height of bed was raised and required CGA. Patient performed mobility in hallway with supervision for safety with slow gait and stated pain in BLEs. Patient returned to room and asked to use bathroom with supervision to transfer to toilet for safety and mod I for toilet hygiene and clothing management. Patient returned to supine without assistance. Acute OT to continue to follow to address established goals.       If plan is discharge home, recommend the following:  Assistance with cooking/housework;Assist for transportation;Help with stairs or ramp for entrance   Equipment Recommendations  Tub/shower seat    Recommendations for Other Services      Precautions / Restrictions Precautions Precautions: Fall Restrictions Weight Bearing Restrictions Per Provider Order: No       Mobility Bed Mobility Overal bed mobility: Modified Independent             General bed mobility comments: increased time and use of bed rail to assist    Transfers Overall transfer level: Needs assistance Equipment used: Rolling walker (2 wheels) Transfers: Sit to/from Stand, Bed to chair/wheelchair/BSC Sit to Stand: Contact guard assist           General  transfer comment: CGA to stand from raised bed and from chair, supervision from toilet with use of grab bars     Balance Overall balance assessment: Mild deficits observed, not formally tested                                         ADL either performed or assessed with clinical judgement   ADL Overall ADL's : Needs assistance/impaired     Grooming: Wash/dry hands;Standing;Supervision/safety Grooming Details (indicate cue type and reason): at sink                 Toilet Transfer: Ambulation;Comfort height toilet;Rolling walker (2 wheels);Supervision/safety;Grab bars Toilet Transfer Details (indicate cue type and reason): cues for safety, used grab bars to lower to seat and to stand Toileting- Clothing Manipulation and Hygiene: Independent;Sitting/lateral lean Toileting - Clothing Manipulation Details (indicate cue type and reason): able to perform toilet hygiene seated and stood for clothing management without assistance     Functional mobility during ADLs: Rolling walker (2 wheels);Supervision/safety      Extremity/Trunk Assessment Upper Extremity Assessment Upper Extremity Assessment: Overall WFL for tasks assessed            Vision       Perception     Praxis     Communication Communication Communication: Impaired Factors Affecting Communication: Hearing impaired   Cognition Arousal: Alert Behavior During Therapy: Flat affect Cognition: Cognition impaired       Memory impairment (select all impairments): Short-term memory (possible baseline)  OT - Cognition Comments: slow to respond to questions/commands/processing                 Following commands: Intact        Cueing      Exercises      Shoulder Instructions       General Comments patient with slow gait and demonstrating pain with mobility    Pertinent Vitals/ Pain       Pain Assessment Pain Assessment: Faces Faces Pain Scale: Hurts little more Pain  Location: back, left flank, BLEs Pain Descriptors / Indicators: Grimacing, Guarding, Sore Pain Intervention(s): Limited activity within patient's tolerance, Monitored during session, Repositioned, Heat applied  Home Living                                          Prior Functioning/Environment              Frequency  Min 2X/week        Progress Toward Goals  OT Goals(current goals can now be found in the care plan section)  Progress towards OT goals: Progressing toward goals  Acute Rehab OT Goals OT Goal Formulation: With patient Time For Goal Achievement: 11/18/23 Potential to Achieve Goals: Good ADL Goals Pt Will Perform Grooming: with modified independence;standing Pt Will Perform Lower Body Bathing: with modified independence;sit to/from stand Pt Will Perform Lower Body Dressing: with modified independence;sit to/from stand Pt Will Transfer to Toilet: ambulating;regular height toilet  Plan      Co-evaluation                 AM-PAC OT "6 Clicks" Daily Activity     Outcome Measure   Help from another person eating meals?: None Help from another person taking care of personal grooming?: A Little Help from another person toileting, which includes using toliet, bedpan, or urinal?: A Little Help from another person bathing (including washing, rinsing, drying)?: A Little Help from another person to put on and taking off regular upper body clothing?: A Little Help from another person to put on and taking off regular lower body clothing?: A Little 6 Click Score: 19    End of Session Equipment Utilized During Treatment: Rolling walker (2 wheels);Gait belt  OT Visit Diagnosis: Unsteadiness on feet (R26.81);Other abnormalities of gait and mobility (R26.89);Pain Pain - Right/Left: Left Pain - part of body: Leg (back and right flank)   Activity Tolerance Patient tolerated treatment well   Patient Left in bed;with call bell/phone within  reach;with bed alarm set   Nurse Communication Mobility status        Time: 4098-1191 OT Time Calculation (min): 38 min  Charges: OT General Charges $OT Visit: 1 Visit OT Treatments $Self Care/Home Management : 23-37 mins $Therapeutic Activity: 8-22 mins  Anitra Barn, OTA Acute Rehabilitation Services  Office [phone]   Jovita Nipper 11/05/2023, 12:27 PM

## 2023-11-05 NOTE — Discharge Summary (Signed)
 Physician Discharge Summary  Gabriel Koch:[id number] DOB: [birth year] DOA: 11/02/2023  PCP: Kathyleen Parkins, MD  Admit date: 11/02/2023 Discharge date: 11/05/2023  Admitted From: Home Disposition: Home  Recommendations for Outpatient Follow-up:  Follow up with PCP in 1-2 weeks Follow-up with neurosurgery as scheduled  Home Health: None, outpatient PT set up Equipment/Devices: Tub bench, rolling walker  Discharge Condition: Stable CODE STATUS: Full Diet recommendation: Low-salt low-fat diet  Brief/Interim Summary: Gabriel Koch is a 58 y.o. male with medical history significant for alcohol abuse, anxiety and depression, stroke, COPD, hypertension.  Patient presented to the ED with complaints of falls, head and rib pain after a fall on 4/27 (48 hours prior to presentation to the ED) as well as again on the morning of the 27th.   Imaging and intake concerning for focal acute subarachnoid hemorrhage in the left sylvian fissure without midline shift or edema.  Also notable left L1 and L2 transverse process fractures, possible L3 transverse process fracture.  Hospitalist called for admission, neurosurgery called in consult.  Repeat CT per neurosurgery generally unchanged from prior per radiology.  Initiate Keppra  x 7 days for prophylaxis.  Neuro otherwise recommending outpatient evaluation and imaging in their office.   Given patient's profound ambulatory dysfunction, unsafe disposition home PT OT have been consulted to follow along.  Patient improving drastically, now cleared for outpatient PT and discharge home.  DME as above.  Recommend close follow-up with PCP in 1 to 2 weeks to further evaluate ongoing pain control and ambulatory status.  Discharge Diagnoses:  Principal Problem:   Subarachnoid hemorrhage (HCC) Active Problems:   Hyponatremia   Falls   Alcohol intoxication (HCC)   Pancytopenia (HCC)   HTN (hypertension)   Hypomagnesemia   Tobacco abuse  Subarachnoid  hemorrhage (HCC) -Secondary to mechanical fall in the setting of intoxication with alcohol - Repeat CT head generally unchanged from prior with notable acute subarachnoid hemorrhage of the left sylvian fissure. - Appreciate neurosurgery insight recommendations, continue conservative medical management at this time, no indication at this time for further imaging or procedures. - Keppra  500 mg twice daily for 7 days per neurosurgery   Acute alcohol intoxication (HCC) Acute toxic encephalopathy -Profound history of alcohol use and abuse -averaging likely more than 12 drinks per day; has been drinking since he was 58 years old - Continue Librium  taper - Continue multivitamins per protocol otherwise no signs or symptoms of withdrawal thus far -Patient has no indication he is wanting to quit drinking -Hold home diazepam  dosing -educated the patient on risks of concurrent benzodiazepine use and alcohol use   Acute on chronic ambulatory dysfunction with frequent falls  Traumatic injuries as above  - See above in regards to trauma, neurosurgery following along, imaging stable - PT OT following - plan for dispo to SNF pending recovery   Hypovolemic hyponatremia versus beer potomania -Sodium baseline ranging 129-134 chronically, 122 at intake - Currently 130, continue to follow   Moderate/Severe protein caloric malnutrition Hypomagnesemia Pancytopenia (HCC) Near baseline, likely secondary to chronic alcohol use and abuse and malnutrition Replete magnesium /continue vitamin supplementation   Tobacco abuse Nicotine  patch recommended.   HTN (hypertension) Stable. Stop losartan    Discharge Instructions  Discharge Instructions     Ambulatory referral to Occupational Therapy   Complete by: As directed    Ambulatory referral to Physical Therapy   Complete by: As directed       Allergies as of 11/05/2023       Reactions  Bee Venom Anaphylaxis        Medication List     STOP  taking these medications    losartan  50 MG tablet Commonly known as: COZAAR        TAKE these medications    acetaminophen  325 MG tablet Commonly known as: TYLENOL  Take 2 tablets (650 mg total) by mouth every 6 (six) hours as needed for mild pain (pain score 1-3) (or Fever >/= 101).   albuterol  108 (90 Base) MCG/ACT inhaler Commonly known as: VENTOLIN  HFA Inhale 1 puff into the lungs every 4 (four) hours as needed for wheezing or shortness of breath.   buPROPion  75 MG tablet Commonly known as: WELLBUTRIN  Take 2 tablets (150 mg total) by mouth 2 (two) times daily.   busPIRone  10 MG tablet Commonly known as: BUSPAR  Take 1 tablet (10 mg total) by mouth 2 (two) times daily.   chlordiazePOXIDE  25 MG capsule Commonly known as: LIBRIUM  Take 1 capsule (25 mg total) by mouth 2 (two) times daily for 2 days, THEN 1 capsule (25 mg total) daily in the afternoon for 2 days. Start taking on: Nov 05, 2023   diclofenac Sodium 1 % Gel Commonly known as: VOLTAREN Apply 2 g topically 4 (four) times daily.   esomeprazole  40 MG capsule Commonly known as: NEXIUM  Take 1 capsule (40 mg total) by mouth 2 (two) times daily before a meal.   folic acid  1 MG tablet Commonly known as: FOLVITE  Take 1 tablet (1 mg total) by mouth daily. Start taking on: Nov 06, 2023   HYDROcodone -acetaminophen  5-325 MG tablet Commonly known as: NORCO/VICODIN Take 1 tablet by mouth every 6 (six) hours as needed for moderate pain (pain score 4-6) or severe pain (pain score 7-10).   levETIRAcetam  500 MG 24 hr tablet Commonly known as: KEPPRA  XR Take 1 tablet (500 mg total) by mouth every 12 (twelve) hours for 6 days.   lidocaine  5 % Commonly known as: Lidoderm  Place 1 patch onto the skin daily. Remove & Discard patch within 12 hours or as directed by MD   magnesium  chloride 64 MG Tbec SR tablet Commonly known as: SLOW-MAG Take 1 tablet (64 mg total) by mouth 2 (two) times daily for 4 days.   multivitamin with  minerals Tabs tablet Take 1 tablet by mouth daily. Start taking on: Nov 06, 2023   nicotine  14 mg/24hr patch Commonly known as: NICODERM CQ  - dosed in mg/24 hours Place 1 patch (14 mg total) onto the skin daily. Start taking on: Nov 06, 2023   polyethylene glycol 17 g packet Commonly known as: MIRALAX  / GLYCOLAX  Take 17 g by mouth daily as needed for mild constipation.   thiamine  100 MG tablet Commonly known as: Vitamin B-1 Take 1 tablet (100 mg total) by mouth daily. Start taking on: Nov 06, 2023   traZODone  100 MG tablet Commonly known as: DESYREL  Take 1 tablet (100 mg total) by mouth at bedtime.               Durable Medical Equipment  (From admission, onward)           Start     Ordered   11/05/23 0745  For home use only DME Walker rolling  Once       Question Answer Comment  Walker: With 5 Inch Wheels   Patient needs a walker to treat with the following condition Weakness      11/05/23 0744   11/05/23 0745  For home use only DME  Tub bench  Once        11/05/23 0744            Follow-up Information     [facility]. Schedule an appointment as soon as possible for a visit .   Specialty: Neurosurgery Contact information: [address] [place] [address] [phone]         [facility] [place] Follow up.   Contact information: [address] [place], [place] [address]   Phone [phone]               Allergies  Allergen Reactions   Bee Venom Anaphylaxis    Consultations: Neurosurgery  Procedures/Studies: CT Head Wo Contrast Result Date: 11/02/2023 CLINICAL DATA:  Follow-up examination for intracranial hemorrhage. EXAM: CT HEAD WITHOUT CONTRAST TECHNIQUE: Contiguous axial images were obtained from the base of the skull through the vertex without intravenous contrast. RADIATION DOSE REDUCTION: This exam was performed according to the departmental  dose-optimization program which includes automated exposure control, adjustment of the mA and/or kV according to patient size and/or use of iterative reconstruction technique. COMPARISON:  CT from earlier the same day. FINDINGS: Brain: Previously identified hemorrhage positioned at the superior aspect of the left sylvian fissure again seen, not significantly changed in size or appearance from prior. No significant surrounding edema. No other new acute intracranial hemorrhage. No acute large vessel territory infarct. No mass lesion or midline shift. No hydrocephalus or extra-axial fluid collection. Underlying atrophy with chronic small vessel ischemic disease noted. Vascular: No abnormal hyperdense vessel. Calcified atherosclerosis present at the skull base. Skull: Evolving left periorbital/facial soft tissue contusion again noted. Calvarium intact. Sinuses/Orbits: Globes orbital soft tissues within normal limits. Visualized paranasal sinuses and mastoid air cells are clear. Other: None. IMPRESSION: 1. Stable exam. No significant change in size or appearance of small volume hemorrhage positioned at the superior aspect of the left Sylvian fissure. 2. No other new acute intracranial abnormality. 3. Underlying atrophy with chronic small vessel ischemic disease. Electronically Signed   By: Virgia Griffins M.D.   On: 11/02/2023 23:57   CT Head Wo Contrast Result Date: 11/02/2023 CLINICAL DATA:  Head trauma, intoxication, recent fall with headache and rib pain. EXAM: CT HEAD WITHOUT CONTRAST CT MAXILLOFACIAL WITHOUT CONTRAST CT CERVICAL SPINE WITHOUT CONTRAST TECHNIQUE: Multidetector CT imaging of the head, cervical spine, and maxillofacial structures were performed using the standard protocol without intravenous contrast. Multiplanar CT image reconstructions of the cervical spine and maxillofacial structures were also generated. RADIATION DOSE REDUCTION: This exam was performed according to the departmental  dose-optimization program which includes automated exposure control, adjustment of the mA and/or kV according to patient size and/or use of iterative reconstruction technique. COMPARISON:  MRI head 04/29/2020. FINDINGS: CT HEAD FINDINGS Brain: Focal acute subarachnoid hemorrhage involving the left sylvian fissure. No additional areas of intracranial hemorrhage appreciated. No significant mass effect or midline shift. The basilar cisterns are patent. Nonspecific hypoattenuation in the periventricular and subcortical white matter favored to reflect chronic microvascular ischemic changes. Remote lacunar infarct in the right thalamus. Mild parenchymal volume loss. Ventricles: Prominence of the ventricles suggesting underlying parenchymal volume loss. Vascular: Atherosclerotic calcifications of the carotid siphons. No hyperdense vessel. Skull: No acute or aggressive finding. Other: Mastoid air cells are clear. CT MAXILLOFACIAL FINDINGS Osseous: Maxilla: Intact. Maxilla is edentulous with associated chronic bone loss. Pterygoid Plates: Intact Zygomatic Arch: Intact Orbits: Intact Ethmoid: Intact Sphenoid: Intact Frontal:Intact Mandible: Intact.  No condylar dislocation.  Edentulous. Nasal: Mildly displaced fracture of the right nasal bone. Nondisplaced left nasal bone fracture. Nasal Septum: Intact. No significant deviation. Orbits: Globes are intact. Lenses are normally located. Extraocular muscles and optic nerve sheath complexes are unremarkable. Normal appearance of the retro bulbar fat. There is left periorbital soft tissue swelling with swelling of the upper and lower eyelids. Preseptal soft tissue swelling is most pronounced over the lateral aspect of the orbit without evidence of postseptal involvement. Sinuses: Minimal mucosal thickening in the maxillary sinuses. No air-fluid levels. Soft tissues: Left facial soft tissue swelling overlying the maxillary sinus and zygomatic arch. CT CERVICAL SPINE FINDINGS  Alignment: Straightening of the normal cervical lordosis. No listhesis. No facet subluxation or dislocation. Skull base and vertebrae: No compression fracture or displaced fracture in the cervical spine. No suspicious osseous lesion. Soft tissues and spinal canal: No prevertebral fluid or swelling. No visible canal hematoma. Disc levels: Moderate disc space narrowing at C5-6. Disc osteophyte complex at C5-6 eccentric to the left without high-grade osseous spinal canal stenosis. There is mild-to-moderate foraminal narrowing on the left at C5-6. Upper chest: No acute abnormality. Other: None. Traumatic Brain Injury Risk Stratification Skull Fracture: No - Low/mBIG 1 Subdural Hematoma (SDH): No - Low Subarachnoid Hemorrhage [facility]): trace (up to 2 sulci) - Low/mBIG 1 Epidural Hematoma (EDH): No - Low/mBIG 1 Cerebral contusion, intra-axial, intraparenchymal Hemorrhage (IPH): No Intraventricular Hemorrhage (IVH): No - Low/mBIG 1 Midline Shift > 1mm or Edema/effacement of sulci/vents: No - Low/mBIG 1 ---------------------------------------------------- IMPRESSION: Focal acute subarachnoid hemorrhage in the left sylvian fissure. No associated edema or midline shift. No acute fracture or traumatic malalignment of the cervical spine. Bilateral nasal bone fractures as above. On review of prior CT from 2021 these are likely chronic. Left periorbital and left facial soft tissue swelling. Chronic microvascular ischemic changes and mild parenchymal volume loss. Remote lacunar infarct in the right thalamus. Finding of acute subarachnoid hemorrhage called by telephone at the time of interpretation on 11/02/2023 at 5:43 pm to provider Judie Noun , who verbally acknowledged these results. Electronically Signed   By: Denny Flack M.D.   On: 11/02/2023 17:44   CT Cervical Spine Wo Contrast Result Date: 11/02/2023 CLINICAL DATA:  Head trauma, intoxication, recent fall with headache and rib pain. EXAM: CT HEAD WITHOUT CONTRAST CT  MAXILLOFACIAL WITHOUT CONTRAST CT CERVICAL SPINE WITHOUT CONTRAST TECHNIQUE: Multidetector CT imaging of the head, cervical spine, and maxillofacial structures were performed using the standard protocol without intravenous contrast. Multiplanar CT image reconstructions of the cervical spine and maxillofacial structures were also generated. RADIATION DOSE REDUCTION: This exam was performed according to the departmental dose-optimization program which includes automated exposure control, adjustment of the mA and/or kV according to patient size and/or use of iterative reconstruction technique. COMPARISON:  MRI head 04/29/2020. FINDINGS: CT HEAD FINDINGS Brain: Focal acute subarachnoid hemorrhage involving the left sylvian fissure. No additional areas of intracranial hemorrhage appreciated. No significant mass effect or midline shift. The basilar cisterns are patent. Nonspecific hypoattenuation in the periventricular and subcortical white matter favored to reflect chronic microvascular ischemic changes. Remote lacunar infarct in the right thalamus. Mild parenchymal volume loss. Ventricles: Prominence of the ventricles suggesting underlying parenchymal volume loss. Vascular: Atherosclerotic calcifications of the carotid siphons. No hyperdense vessel. Skull: No acute or aggressive finding. Other: Mastoid air cells are clear. CT MAXILLOFACIAL FINDINGS Osseous: Maxilla: Intact. Maxilla is edentulous with associated chronic bone loss. Pterygoid Plates: Intact Zygomatic Arch: Intact Orbits: Intact Ethmoid: Intact Sphenoid: Intact  Frontal:Intact Mandible: Intact.  No condylar dislocation.  Edentulous. Nasal: Mildly displaced fracture of the right nasal bone. Nondisplaced left nasal bone fracture. Nasal Septum: Intact. No significant deviation. Orbits: Globes are intact. Lenses are normally located. Extraocular muscles and optic nerve sheath complexes are unremarkable. Normal appearance of the retro bulbar fat. There is left  periorbital soft tissue swelling with swelling of the upper and lower eyelids. Preseptal soft tissue swelling is most pronounced over the lateral aspect of the orbit without evidence of postseptal involvement. Sinuses: Minimal mucosal thickening in the maxillary sinuses. No air-fluid levels. Soft tissues: Left facial soft tissue swelling overlying the maxillary sinus and zygomatic arch. CT CERVICAL SPINE FINDINGS Alignment: Straightening of the normal cervical lordosis. No listhesis. No facet subluxation or dislocation. Skull base and vertebrae: No compression fracture or displaced fracture in the cervical spine. No suspicious osseous lesion. Soft tissues and spinal canal: No prevertebral fluid or swelling. No visible canal hematoma. Disc levels: Moderate disc space narrowing at C5-6. Disc osteophyte complex at C5-6 eccentric to the left without high-grade osseous spinal canal stenosis. There is mild-to-moderate foraminal narrowing on the left at C5-6. Upper chest: No acute abnormality. Other: None. Traumatic Brain Injury Risk Stratification Skull Fracture: No - Low/mBIG 1 Subdural Hematoma (SDH): No - Low Subarachnoid Hemorrhage [facility]): trace (up to 2 sulci) - Low/mBIG 1 Epidural Hematoma (EDH): No - Low/mBIG 1 Cerebral contusion, intra-axial, intraparenchymal Hemorrhage (IPH): No Intraventricular Hemorrhage (IVH): No - Low/mBIG 1 Midline Shift > 1mm or Edema/effacement of sulci/vents: No - Low/mBIG 1 ---------------------------------------------------- IMPRESSION: Focal acute subarachnoid hemorrhage in the left sylvian fissure. No associated edema or midline shift. No acute fracture or traumatic malalignment of the cervical spine. Bilateral nasal bone fractures as above. On review of prior CT from 2021 these are likely chronic. Left periorbital and left facial soft tissue swelling. Chronic microvascular ischemic changes and mild parenchymal volume loss. Remote lacunar infarct in the right thalamus. Finding of acute  subarachnoid hemorrhage called by telephone at the time of interpretation on 11/02/2023 at 5:43 pm to provider Judie Noun , who verbally acknowledged these results. Electronically Signed   By: Denny Flack M.D.   On: 11/02/2023 17:44   CT Maxillofacial WO CM Result Date: 11/02/2023 CLINICAL DATA:  Head trauma, intoxication, recent fall with headache and rib pain. EXAM: CT HEAD WITHOUT CONTRAST CT MAXILLOFACIAL WITHOUT CONTRAST CT CERVICAL SPINE WITHOUT CONTRAST TECHNIQUE: Multidetector CT imaging of the head, cervical spine, and maxillofacial structures were performed using the standard protocol without intravenous contrast. Multiplanar CT image reconstructions of the cervical spine and maxillofacial structures were also generated. RADIATION DOSE REDUCTION: This exam was performed according to the departmental dose-optimization program which includes automated exposure control, adjustment of the mA and/or kV according to patient size and/or use of iterative reconstruction technique. COMPARISON:  MRI head 04/29/2020. FINDINGS: CT HEAD FINDINGS Brain: Focal acute subarachnoid hemorrhage involving the left sylvian fissure. No additional areas of intracranial hemorrhage appreciated. No significant mass effect or midline shift. The basilar cisterns are patent. Nonspecific hypoattenuation in the periventricular and subcortical white matter favored to reflect chronic microvascular ischemic changes. Remote lacunar infarct in the right thalamus. Mild parenchymal volume loss. Ventricles: Prominence of the ventricles suggesting underlying parenchymal volume loss. Vascular: Atherosclerotic calcifications of the carotid siphons. No hyperdense vessel. Skull: No acute or aggressive finding. Other: Mastoid air cells are clear. CT MAXILLOFACIAL FINDINGS Osseous: Maxilla: Intact. Maxilla is edentulous with associated chronic bone loss. Pterygoid Plates: Intact Zygomatic Arch: Intact Orbits: Intact Ethmoid:  Intact Sphenoid: Intact  Frontal:Intact Mandible: Intact.  No condylar dislocation.  Edentulous. Nasal: Mildly displaced fracture of the right nasal bone. Nondisplaced left nasal bone fracture. Nasal Septum: Intact. No significant deviation. Orbits: Globes are intact. Lenses are normally located. Extraocular muscles and optic nerve sheath complexes are unremarkable. Normal appearance of the retro bulbar fat. There is left periorbital soft tissue swelling with swelling of the upper and lower eyelids. Preseptal soft tissue swelling is most pronounced over the lateral aspect of the orbit without evidence of postseptal involvement. Sinuses: Minimal mucosal thickening in the maxillary sinuses. No air-fluid levels. Soft tissues: Left facial soft tissue swelling overlying the maxillary sinus and zygomatic arch. CT CERVICAL SPINE FINDINGS Alignment: Straightening of the normal cervical lordosis. No listhesis. No facet subluxation or dislocation. Skull base and vertebrae: No compression fracture or displaced fracture in the cervical spine. No suspicious osseous lesion. Soft tissues and spinal canal: No prevertebral fluid or swelling. No visible canal hematoma. Disc levels: Moderate disc space narrowing at C5-6. Disc osteophyte complex at C5-6 eccentric to the left without high-grade osseous spinal canal stenosis. There is mild-to-moderate foraminal narrowing on the left at C5-6. Upper chest: No acute abnormality. Other: None. Traumatic Brain Injury Risk Stratification Skull Fracture: No - Low/mBIG 1 Subdural Hematoma (SDH): No - Low Subarachnoid Hemorrhage [facility]): trace (up to 2 sulci) - Low/mBIG 1 Epidural Hematoma (EDH): No - Low/mBIG 1 Cerebral contusion, intra-axial, intraparenchymal Hemorrhage (IPH): No Intraventricular Hemorrhage (IVH): No - Low/mBIG 1 Midline Shift > 1mm or Edema/effacement of sulci/vents: No - Low/mBIG 1 ---------------------------------------------------- IMPRESSION: Focal acute subarachnoid hemorrhage in the left sylvian  fissure. No associated edema or midline shift. No acute fracture or traumatic malalignment of the cervical spine. Bilateral nasal bone fractures as above. On review of prior CT from 2021 these are likely chronic. Left periorbital and left facial soft tissue swelling. Chronic microvascular ischemic changes and mild parenchymal volume loss. Remote lacunar infarct in the right thalamus. Finding of acute subarachnoid hemorrhage called by telephone at the time of interpretation on 11/02/2023 at 5:43 pm to provider Judie Noun , who verbally acknowledged these results. Electronically Signed   By: Denny Flack M.D.   On: 11/02/2023 17:44   CT CHEST ABDOMEN PELVIS W CONTRAST Result Date: 11/02/2023 CLINICAL DATA:  Abdominal trauma, blunt EXAM: CT CHEST, ABDOMEN, AND PELVIS WITH CONTRAST TECHNIQUE: Multidetector CT imaging of the chest, abdomen and pelvis was performed following the standard protocol during bolus administration of intravenous contrast. RADIATION DOSE REDUCTION: This exam was performed according to the departmental dose-optimization program which includes automated exposure control, adjustment of the mA and/or kV according to patient size and/or use of iterative reconstruction technique. CONTRAST:  OMNIPAQUE  IOHEXOL  300 MG/ML  SOLN COMPARISON:  CT abdomen pelvis 05/13/2023 FINDINGS: CHEST: Cardiovascular: No aortic injury. The thoracic aorta is normal in caliber. The heart is normal in size. No significant pericardial effusion. Moderate severe atherosclerotic plaque. At least 3 vessel coronary artery calcification. Mediastinum/Nodes: No pneumomediastinum. No mediastinal hematoma. The esophagus is unremarkable. The thyroid  is unremarkable. The central airways are patent. No mediastinal, hilar, or axillary lymphadenopathy. Lungs/Pleura: No focal consolidation. No pulmonary nodule. No pulmonary mass. No pulmonary contusion or laceration. No pneumatocele formation. No pleural effusion. No pneumothorax.  No hemothorax. Musculoskeletal/Chest wall: No chest wall mass. Old healed nonunionized left posterior rib fractures. No acute rib or sternal fracture. No spinal fracture. ABDOMEN / PELVIS: Hepatobiliary: Not enlarged. No focal lesion. No laceration or subcapsular hematoma. The gallbladder is otherwise unremarkable  with no radio-opaque gallstones. No biliary ductal dilatation. Pancreas: Normal pancreatic contour. No main pancreatic duct dilatation. Spleen: Not enlarged. No focal lesion. No laceration, subcapsular hematoma, or vascular injury. Adrenals/Urinary Tract: No nodularity bilaterally. Bilateral kidneys enhance symmetrically. No hydronephrosis. No contusion, laceration, or subcapsular hematoma. No injury to the vascular structures or collecting systems. No hydroureter. The urinary bladder is unremarkable. Stomach/Bowel: No small or large bowel wall thickening or dilatation. Colonic diverticulosis. The appendix is unremarkable. Vasculature/Lymphatics: Severe atherosclerotic plaque. No abdominal aorta or iliac aneurysm. No active contrast extravasation or pseudoaneurysm. No abdominal, pelvic, inguinal lymphadenopathy. Reproductive: Prostate is unremarkable. Other: No simple free fluid ascites. No pneumoperitoneum. No hemoperitoneum. No mesenteric hematoma identified. No organized fluid collection. Musculoskeletal: No significant soft tissue hematoma. No acute pelvic fracture. Acute displaced left L1 and L2 transverse process fractures. Possible nondisplaced L3 left transverse process fracture. Other ports and devices: None. IMPRESSION: 1. Acute displaced left L1 and L2 transverse process fractures. 2. Possible nondisplaced acute left L3 transverse process fracture. 3. No acute intrathoracic, intra-abdominal, intrapelvic traumatic injury. 4. No acute fracture or traumatic malalignment of the thoracic spine. 5.  Aortic Atherosclerosis (ICD10-I70.0). Electronically Signed   By: Morgane  Naveau M.D.   On: 11/02/2023  17:36     Subjective: No acute issues or events overnight denies nausea vomiting diarrhea constipation headache fevers chills or chest pain   Discharge Exam: Vitals:   11/05/23 0446 11/05/23 0700  BP: 119/69 119/69  Pulse: 87 83  Resp: 17 17  Temp: 98 F (36.7 C) 97.8 F (36.6 C)  SpO2: 97% 97%   Vitals:   11/04/23 2009 11/05/23 0023 11/05/23 0446 11/05/23 0700  BP: 135/68 125/72 119/69 119/69  Pulse: 85 83 87 83  Resp: 17 17 17 17   Temp: 98 F (36.7 C) 97.8 F (36.6 C) 98 F (36.7 C) 97.8 F (36.6 C)  TempSrc: Oral Oral Oral Oral  SpO2: 100% 99% 97% 97%  Weight:      Height:        General: Pt is alert, awake, not in acute distress Cardiovascular: RRR, S1/S2 +, no rubs, no gallops Respiratory: CTA bilaterally, no wheezing, no rhonchi Abdominal: Soft, NT, ND, bowel sounds + Skin: Notable ecchymosis over left head, all 4 limbs   The results of significant diagnostics from this hospitalization (including imaging, microbiology, ancillary and laboratory) are listed below for reference.     Microbiology: No results found for this or any previous visit (from the past 240 hours).   Labs:  Basic Metabolic Panel: Recent Labs  Lab 11/02/23 1538 11/02/23 1810 11/03/23 0536 11/04/23 0601 11/05/23 0537  NA 122*  --  128* 130* 132*  K 4.0  --  4.2 3.8 3.9  CL 91*  --  100 100 100  CO2 19*  --  20* 21* 23  GLUCOSE 86  --  92 101* 100*  BUN <5*  --  <5* <5* <5*  CREATININE 0.73  --  0.74 0.83 0.79  CALCIUM  8.5*  --  8.2* 8.5* 8.5*  MG  --  1.4* 1.8  --   --   PHOS  --  3.6  --   --   --    Liver Function Tests: Recent Labs  Lab 11/02/23 1538 11/04/23 0601 11/05/23 0537  AST 134* 95* 63*  ALT 36 31 27  ALKPHOS 271* 251* 228*  BILITOT 1.5* 2.1* 1.6*  PROT 7.1 6.2* 6.2*  ALBUMIN 2.9* 2.4* 2.4*   CBC: Recent Labs  Lab  11/02/23 1445 11/03/23 0536 11/04/23 0601 11/05/23 0537  WBC 2.3* 1.3* 1.6* 1.7*  NEUTROABS 1.3*  --   --   --   HGB 9.5* 8.5* 7.9*  7.4*  HCT 26.4* 23.6* 22.2* 21.7*  MCV 110.0* 109.8* 113.3* 113.6*  PLT 70* 52* 51* 57*   CBG: Recent Labs  Lab 11/02/23 2215  GLUCAP 89   Urinalysis    Component Value Date/Time   COLORURINE YELLOW 11/02/2023 1539   APPEARANCEUR CLEAR 11/02/2023 1539   APPEARANCEUR Clear 03/09/2013 2142   LABSPEC 1.004 (L) 11/02/2023 1539   LABSPEC 1.002 03/09/2013 2142   PHURINE 5.0 11/02/2023 1539   GLUCOSEU NEGATIVE 11/02/2023 1539   GLUCOSEU Negative 03/09/2013 2142   HGBUR NEGATIVE 11/02/2023 1539   BILIRUBINUR NEGATIVE 11/02/2023 1539   BILIRUBINUR Negative 03/09/2013 2142   KETONESUR NEGATIVE 11/02/2023 1539   PROTEINUR NEGATIVE 11/02/2023 1539   NITRITE NEGATIVE 11/02/2023 1539   LEUKOCYTESUR NEGATIVE 11/02/2023 1539   LEUKOCYTESUR Negative 03/09/2013 2142   Sepsis Labs Recent Labs  Lab 11/02/23 1445 11/03/23 0536 11/04/23 0601 11/05/23 0537  WBC 2.3* 1.3* 1.6* 1.7*   Microbiology No results found for this or any previous visit (from the past 240 hours).   Time coordinating discharge: Over 30 minutes  SIGNED:   Haydee Lipa, DO [facility] 11/05/2023, 1:51 PM Pager   If 7PM-7AM, please contact night-coverage [email or web address]

## 2023-11-05 NOTE — Progress Notes (Signed)
 Pt for discharge. Peggy coming to get pt. 3N1 and walker with pt.

## 2023-11-05 NOTE — Progress Notes (Signed)
 Meds delivered from pharmacy.  Given to charge RN.  Pt pending transport arrival to home.

## 2023-11-05 NOTE — Plan of Care (Signed)

## 2023-11-05 NOTE — TOC Benefit Eligibility Note (Signed)
 Transition of Care [facility]) Benefit Eligibility Note    Patient Details  Name: ESTIVEN PHILLIPE MRN: [id number] Date of Birth: 1966-06-03      Covered?: Yes (HHPT  OR  OPPY)        Spoke with Person/Company/Phone Number:: [employer] @  [employer]  @ [phone] X [phone]           Additional Notes: NEED TO [facility] CARE #  [phone]  FOR MEDICAL NECESSITY    Peyton Brash Phone Number: 11/05/2023, 12:48 PM

## 2023-11-05 NOTE — TOC Transition Note (Addendum)
 Transition of Care [facility]) - Discharge Note   Patient Details  Name: Gabriel Koch MRN: [id number] Date of Birth: 1966/03/04  [facility] of [facility] CM/SW Contact:  Ronni Colace, RN Phone Number: 11/05/2023, 6:38 PM   Clinical Narrative:    The patient is refusing to be discharged. He stated to the nurse taking care of him that we would have to get a state trooper to get him out.  Dr Rudine Cos and team aware. The patient has DME at bedside, he has referrals for outpatient PT OT. Was refused by all [facility] agencies.  Called [facility], suggestions were to get security to speak to him, or if applicable to get a psych consult. These choices were broached to the team.  [phone] Security spoke with patient. Patient stated he does not have a ride. Nursing called his cousin who will come get him.     Barriers to Discharge: Continued Medical Work up   Patient Goals and CMS Choice   CMS Medicare.gov Compare Post Acute Care list provided to:: Patient Choice offered to / list presented to : Patient      Discharge Placement                       Discharge Plan and Services Additional resources added to the After Visit Summary for                                       Social Drivers of Health (SDOH) Interventions SDOH Screenings   Food Insecurity: No Food Insecurity (11/02/2023)  Housing: Low Risk  (11/02/2023)  Transportation Needs: No Transportation Needs (11/02/2023)  Utilities: Not At Risk (11/02/2023)  Tobacco Use: High Risk (11/02/2023)     Readmission Risk Interventions     No data to display

## 2023-11-05 NOTE — Progress Notes (Addendum)
 PT/OT cleared pt for discharge. Pt refused discharge.  MD notified.   Spoke to pt a second time about discharge. He said he is not going anywhere & don't arrange transport until his cousin gets here later & if the Dr has a problem with it he can come talk to him . PT came back a second time for the day to work with pt.   Security came up and spoke to pt.  Cathleen Coach, Consulting civil engineer, spoke to next of kin (cousin-Patty) and arranged transport for pt.  Cousin agreed to come pick up pt.  AVS reviewed with pt.   Pt ready for discharge with belongings in hand, along with shower chair and walker.  Waiting on meds. Pharmacist called and will bring up since [facility] is now closed.

## 2023-11-05 NOTE — Progress Notes (Signed)
 Physical Therapy Treatment Patient Details Name: Gabriel Koch MRN: [id number] DOB: Dec 15, 1965 Today's Date: 11/05/2023   History of Present Illness Pt is a 58 y.o. M who presents 11/02/2023 with complaints of falls. Imaging concerning for focal acute SAH in left sylvian fissure without midline shift or edema. Also notable for left L1 and L2 transverse process fractures, possible L3 transverse process fracture; recommendation for outpatient neurosurgery follow up. Significant PMH: alcohol abuse, anxiety, depression, stroke, COPD, HTN.    PT Comments  Pt demonstrates continued deficits in strength, balance, gait, endurance. Pt is frail appearing and tremulous when mobilizing. Pt demonstrates increased sway when without UE support of DME and reports not having any caregiver support at home. Pt is resistant to the idea of discharging home, reporting he will need the sheriff to come take him out of the hospital. Pt will benefit from increased time to regain strength and endurance, while also improving balance. Pt also have 3 stairs to enter his home, no railings to provide support, and does not appear to have the balance or strength to negotiate these unassisted at this time. Patient will benefit from continued inpatient follow up therapy, <3 hours/day.    If plan is discharge home, recommend the following: A little help with bathing/dressing/bathroom;Assistance with cooking/housework;Assist for transportation;Help with stairs or ramp for entrance   Can travel by private vehicle     Yes  Equipment Recommendations  Rolling walker (2 wheels)    Recommendations for Other Services       Precautions / Restrictions Precautions Precautions: Fall Recall of Precautions/Restrictions: Intact Restrictions Weight Bearing Restrictions Per Provider Order: No     Mobility  Bed Mobility Overal bed mobility: Modified Independent                  Transfers Overall transfer level: Needs  assistance Equipment used: Rolling walker (2 wheels) Transfers: Sit to/from Stand Sit to Stand: Contact guard assist                Ambulation/Gait Ambulation/Gait assistance: Contact guard assist Gait Distance (Feet): 70 Feet Assistive device: Rolling walker (2 wheels) Gait Pattern/deviations: Step-to pattern Gait velocity: reduced Gait velocity interpretation: <1.31 ft/sec, indicative of household ambulator   General Gait Details: slowed step-to gait, increased lateral sway and tremors noted in BLE frequently during ambulation   Stairs             Wheelchair Mobility     Tilt Bed    Modified Rankin (Stroke Patients Only)       Balance Overall balance assessment: Needs assistance Sitting-balance support: No upper extremity supported, Feet supported Sitting balance-Leahy Scale: Fair     Standing balance support: Single extremity supported, Reliant on assistive device for balance Standing balance-Leahy Scale: Poor Standing balance comment: reliant on UE support, greatly increased sway without UE support                            Communication Communication Communication: Impaired Factors Affecting Communication: Hearing impaired  Cognition Arousal: Alert Behavior During Therapy: WFL for tasks assessed/performed   PT - Cognitive impairments: Memory                         Following commands: Intact      Cueing Cueing Techniques: Verbal cues  Exercises      General Comments General comments (skin integrity, edema, etc.): pt in NAD  Pertinent Vitals/Pain Pain Assessment Pain Assessment: Faces Faces Pain Scale: Hurts even more Pain Location: lef flank Pain Descriptors / Indicators: Grimacing Pain Intervention(s): Monitored during session    Home Living                          Prior Function            PT Goals (current goals can now be found in the care plan section) Acute Rehab PT Goals Patient  Stated Goal: would like to stay sober Progress towards PT goals: Progressing toward goals    Frequency    Min 2X/week      PT Plan      Co-evaluation              AM-PAC PT "6 Clicks" Mobility   Outcome Measure  Help needed turning from your back to your side while in a flat bed without using bedrails?: None Help needed moving from lying on your back to sitting on the side of a flat bed without using bedrails?: None Help needed moving to and from a bed to a chair (including a wheelchair)?: A Little Help needed standing up from a chair using your arms (e.g., wheelchair or bedside chair)?: A Little Help needed to walk in hospital room?: A Little Help needed climbing 3-5 steps with a railing? : A Lot 6 Click Score: 19    End of Session Equipment Utilized During Treatment: Gait belt Activity Tolerance: Patient tolerated treatment well Patient left: in bed;with call bell/phone within reach;with bed alarm set Nurse Communication: Mobility status PT Visit Diagnosis: Unsteadiness on feet (R26.81);History of falling (Z91.81)     Time: 4098-1191 PT Time Calculation (min) (ACUTE ONLY): 30 min  Charges:    $Gait Training: 8-22 mins $Therapeutic Activity: 8-22 mins PT General Charges $$ ACUTE PT VISIT: 1 Visit                     Rexie Catena, PT, DPT Acute Rehabilitation Office [phone]    Rexie Catena 11/05/2023, 5:15 PM

## 2023-11-05 NOTE — Progress Notes (Addendum)
   11/05/23 0841  TOC Brief Assessment  Insurance and Status Reviewed  Patient has primary care physician Yes  Home environment has been reviewed lives alone  Prior level of function: independent  Prior/Current Home Services No current home services  Social Drivers of Health Review SDOH reviewed no interventions necessary  Readmission risk has been reviewed Yes     PT recommends HHPT, tub bench and rolling walker.   NCM entered  DME orders and secure chatted MD to sign. Mitch with [facility] checking to see if they are in network with patient's insurance. 1230 DME for home is at bedside   Submitted benefit check to see if patient has HH benefit and if so which agencies are in network. Await response.    Amy with [facility] is not in network with patient's insurance.   Kelly with [facility] is not in network with [employer].   Lynette with [facility] is not in network with [employer].   Bartholomew Light with [facility] is not in network with patient's insurance.   Kasie with [facility] is not in network with patient's insurance.   Artavia with [facility] is not in network with patient's insurance.   Cory with [facility] cannot accept referral    Angie with [facility] cannot accept referral   [facility] cannot accept referral   Thersia Flax with [facility] cannot accept referral   NCM exhausted list of home health agencies.   Patient aware of above. Discussed OP PT /OT , patient has medicaid transportation.   Patient prefers [facility] location for OP PT/OT . Entered referral asked MD to sign

## 2023-11-05 NOTE — Progress Notes (Signed)
 Mobility Specialist Progress Note:   11/05/23 1115  Mobility  Activity Ambulated with assistance in hallway  Level of Assistance Contact guard assist, steadying assist  Assistive Device Front wheel walker  Distance Ambulated (ft) 90 ft  Activity Response Tolerated well  Mobility Referral Yes  Mobility visit 1 Mobility  Mobility Specialist Start Time (ACUTE ONLY) 0940  Mobility Specialist Stop Time (ACUTE ONLY) 1000  Mobility Specialist Time Calculation (min) (ACUTE ONLY) 20 min   Pt received in bed, agreeable to mobility. Pt requesting to use BR prior to session. SB bed mobility. CG during ambulation. BM successful. Pt performed pericare with supervision. Pt c/o general weakness and lower back pain during ambulation. Verbal cues required to remind pt to stay within RW but no unsteadiness or LOB present. Pt left in chair with call bell in reach and all needs met. Chair alarm on.   Gabriel Koch  Mobility Specialist Please contact via SecureChat Rehab office at [phone]

## 2023-11-05 NOTE — Progress Notes (Signed)
 Walker and shower chair delivered to bedside.

## 2023-11-15 ENCOUNTER — Telehealth: Payer: Self-pay

## 2023-11-15 ENCOUNTER — Other Ambulatory Visit: Payer: Self-pay | Admitting: Gastroenterology

## 2023-11-15 MED ORDER — ESOMEPRAZOLE MAGNESIUM 40 MG PO CPDR
40.0000 mg | DELAYED_RELEASE_CAPSULE | Freq: Two times a day (BID) | ORAL | 5 refills | Status: DC
Start: 2023-11-15 — End: 2024-04-20

## 2023-11-15 NOTE — Telephone Encounter (Signed)
 Pt is needing refills on esomeprazole  sent to [facility]

## 2023-11-15 NOTE — Telephone Encounter (Signed)
 Rx sent.

## 2023-11-22 ENCOUNTER — Ambulatory Visit (HOSPITAL_COMMUNITY)
Admission: RE | Admit: 2023-11-22 | Discharge: 2023-11-22 | Disposition: A | Discharge status: Home / Self Care | Payer: MEDICAID | Source: Ambulatory Visit | Attending: Oncology | Admitting: Oncology

## 2023-11-22 DIAGNOSIS — D61818 Other pancytopenia: Secondary | ICD-10-CM | POA: Diagnosis present

## 2023-12-03 ENCOUNTER — Other Ambulatory Visit: Payer: Self-pay

## 2023-12-03 ENCOUNTER — Inpatient Hospital Stay (HOSPITAL_COMMUNITY)
Admission: EM | Admit: 2023-12-03 | Discharge: 2023-12-06 | DRG: 074 | Disposition: A | Discharge status: Home Health | Payer: MEDICAID | Attending: Internal Medicine | Admitting: Internal Medicine

## 2023-12-03 ENCOUNTER — Encounter (HOSPITAL_COMMUNITY): Payer: Self-pay

## 2023-12-03 DIAGNOSIS — D61818 Other pancytopenia: Secondary | ICD-10-CM

## 2023-12-03 DIAGNOSIS — Z66 Do not resuscitate: Secondary | ICD-10-CM | POA: Diagnosis present

## 2023-12-03 DIAGNOSIS — E876 Hypokalemia: Secondary | ICD-10-CM

## 2023-12-03 DIAGNOSIS — G621 Alcoholic polyneuropathy: Secondary | ICD-10-CM | POA: Diagnosis present

## 2023-12-03 DIAGNOSIS — J4489 Other specified chronic obstructive pulmonary disease: Secondary | ICD-10-CM | POA: Diagnosis present

## 2023-12-03 DIAGNOSIS — M79605 Pain in left leg: Secondary | ICD-10-CM | POA: Diagnosis present

## 2023-12-03 DIAGNOSIS — Z87820 Personal history of traumatic brain injury: Secondary | ICD-10-CM | POA: Diagnosis not present

## 2023-12-03 DIAGNOSIS — E872 Acidosis, unspecified: Secondary | ICD-10-CM | POA: Diagnosis present

## 2023-12-03 DIAGNOSIS — E46 Unspecified protein-calorie malnutrition: Secondary | ICD-10-CM | POA: Diagnosis present

## 2023-12-03 DIAGNOSIS — F17210 Nicotine dependence, cigarettes, uncomplicated: Secondary | ICD-10-CM | POA: Diagnosis present

## 2023-12-03 DIAGNOSIS — I10 Essential (primary) hypertension: Secondary | ICD-10-CM | POA: Diagnosis present

## 2023-12-03 DIAGNOSIS — F1020 Alcohol dependence, uncomplicated: Principal | ICD-10-CM

## 2023-12-03 DIAGNOSIS — Z91030 Bee allergy status: Secondary | ICD-10-CM

## 2023-12-03 DIAGNOSIS — F1010 Alcohol abuse, uncomplicated: Secondary | ICD-10-CM | POA: Diagnosis present

## 2023-12-03 DIAGNOSIS — F17290 Nicotine dependence, other tobacco product, uncomplicated: Secondary | ICD-10-CM | POA: Diagnosis present

## 2023-12-03 DIAGNOSIS — R000 Tachycardia, unspecified: Secondary | ICD-10-CM | POA: Diagnosis present

## 2023-12-03 DIAGNOSIS — E871 Hypo-osmolality and hyponatremia: Secondary | ICD-10-CM | POA: Diagnosis present

## 2023-12-03 DIAGNOSIS — F32A Depression, unspecified: Secondary | ICD-10-CM | POA: Diagnosis present

## 2023-12-03 DIAGNOSIS — Z681 Body mass index (BMI) 19 or less, adult: Secondary | ICD-10-CM

## 2023-12-03 DIAGNOSIS — M79604 Pain in right leg: Secondary | ICD-10-CM | POA: Diagnosis present

## 2023-12-03 DIAGNOSIS — F4310 Post-traumatic stress disorder, unspecified: Secondary | ICD-10-CM | POA: Diagnosis present

## 2023-12-03 DIAGNOSIS — Z79899 Other long term (current) drug therapy: Secondary | ICD-10-CM

## 2023-12-03 DIAGNOSIS — K219 Gastro-esophageal reflux disease without esophagitis: Secondary | ICD-10-CM | POA: Diagnosis present

## 2023-12-03 DIAGNOSIS — F419 Anxiety disorder, unspecified: Secondary | ICD-10-CM | POA: Diagnosis present

## 2023-12-03 LAB — URINALYSIS, ROUTINE W REFLEX MICROSCOPIC
Bacteria, UA: NONE SEEN
Bilirubin Urine: NEGATIVE
Glucose, UA: NEGATIVE mg/dL
Ketones, ur: NEGATIVE mg/dL
Leukocytes,Ua: NEGATIVE
Nitrite: NEGATIVE
Protein, ur: NEGATIVE mg/dL
Specific Gravity, Urine: 1.003 — ABNORMAL LOW (ref 1.005–1.030)
pH: 5 (ref 5.0–8.0)

## 2023-12-03 LAB — COMPREHENSIVE METABOLIC PANEL WITH GFR
ALT: 34 U/L (ref 0–44)
AST: 86 U/L — ABNORMAL HIGH (ref 15–41)
Albumin: 3 g/dL — ABNORMAL LOW (ref 3.5–5.0)
Alkaline Phosphatase: 238 U/L — ABNORMAL HIGH (ref 38–126)
Anion gap: 14 (ref 5–15)
BUN: <5 mg/dL — ABNORMAL LOW (ref 6–20)
CO2: 20 mmol/L — ABNORMAL LOW (ref 22–32)
Calcium: 8.5 mg/dL — ABNORMAL LOW (ref 8.9–10.3)
Chloride: 85 mmol/L — ABNORMAL LOW (ref 98–111)
Creatinine, Ser: 0.67 mg/dL (ref 0.61–1.24)
GFR, Estimated: >60 mL/min (ref >60)
Glucose, Bld: 111 mg/dL — ABNORMAL HIGH (ref 70–99)
Potassium: 3.5 mmol/L (ref 3.5–5.1)
Sodium: 119 mmol/L — CL (ref 135–145)
Total Bilirubin: 0.5 mg/dL (ref 0.0–1.2)
Total Protein: 7.4 g/dL (ref 6.5–8.1)

## 2023-12-03 LAB — CBC WITH DIFFERENTIAL/PLATELET
Abs Immature Granulocytes: 0.01 10*3/uL (ref 0.00–0.07)
Basophils Absolute: 0 10*3/uL (ref 0.0–0.1)
Basophils Relative: 0 %
Eosinophils Absolute: 0 10*3/uL (ref 0.0–0.5)
Eosinophils Relative: 0 %
HCT: 27.6 % — ABNORMAL LOW (ref 39.0–52.0)
Hemoglobin: 10.1 g/dL — ABNORMAL LOW (ref 13.0–17.0)
Immature Granulocytes: 0 %
Lymphocytes Relative: 26 %
Lymphs Abs: 0.9 10*3/uL (ref 0.7–4.0)
MCH: 40.4 pg — ABNORMAL HIGH (ref 26.0–34.0)
MCHC: 36.6 g/dL — ABNORMAL HIGH (ref 30.0–36.0)
MCV: 110.4 fL — ABNORMAL HIGH (ref 80.0–100.0)
Monocytes Absolute: 0.3 10*3/uL (ref 0.1–1.0)
Monocytes Relative: 8 %
Neutro Abs: 2.2 10*3/uL (ref 1.7–7.7)
Neutrophils Relative %: 66 %
Platelets: 105 10*3/uL — ABNORMAL LOW (ref 150–400)
RBC: 2.5 MIL/uL — ABNORMAL LOW (ref 4.22–5.81)
RDW: 14 % (ref 11.5–15.5)
Smear Review: NORMAL
WBC: 3.4 10*3/uL — ABNORMAL LOW (ref 4.0–10.5)
nRBC: 0 % (ref 0.0–0.2)

## 2023-12-03 LAB — SODIUM: Sodium: 124 mmol/L — ABNORMAL LOW (ref 135–145)

## 2023-12-03 MED ORDER — HYDROCODONE-ACETAMINOPHEN 5-325 MG PO TABS
1.0000 | ORAL_TABLET | Freq: Four times a day (QID) | ORAL | Status: DC | PRN
  Administered 2023-12-04 – 2023-12-06 (×3): 1 via ORAL
  Filled 2023-12-03 (×3): qty 1

## 2023-12-03 MED ORDER — ENOXAPARIN SODIUM 40 MG/0.4ML IJ SOSY
40.0000 mg | PREFILLED_SYRINGE | INTRAMUSCULAR | Status: DC
Start: 2023-12-03 — End: 2023-12-05
  Administered 2023-12-03 – 2023-12-04 (×2): 40 mg via SUBCUTANEOUS
  Filled 2023-12-03 (×2): qty 0.4

## 2023-12-03 MED ORDER — BUPROPION HCL 75 MG PO TABS
150.0000 mg | ORAL_TABLET | Freq: Two times a day (BID) | ORAL | Status: DC
  Administered 2023-12-04 – 2023-12-06 (×5): 150 mg via ORAL
  Filled 2023-12-03 (×8): qty 2

## 2023-12-03 MED ORDER — PANTOPRAZOLE SODIUM 40 MG PO TBEC
40.0000 mg | DELAYED_RELEASE_TABLET | Freq: Every day | ORAL | Status: DC
  Administered 2023-12-04 – 2023-12-06 (×3): 40 mg via ORAL
  Filled 2023-12-03 (×3): qty 1

## 2023-12-03 MED ORDER — NAPROXEN 250 MG PO TABS
250.0000 mg | ORAL_TABLET | Freq: Three times a day (TID) | ORAL | Status: DC
  Administered 2023-12-04 – 2023-12-06 (×8): 250 mg via ORAL
  Filled 2023-12-03 (×8): qty 1

## 2023-12-03 MED ORDER — FUROSEMIDE 10 MG/ML IJ SOLN: 20.0000 mg | Freq: Once | INTRAMUSCULAR | Status: DC

## 2023-12-03 MED ORDER — ADULT MULTIVITAMIN W/MINERALS CH
1.0000 | ORAL_TABLET | Freq: Every day | ORAL | Status: DC
  Administered 2023-12-03 – 2023-12-06 (×4): 1 via ORAL
  Filled 2023-12-03 (×4): qty 1

## 2023-12-03 MED ORDER — SODIUM CHLORIDE 0.9 % IV SOLN: INTRAVENOUS | Status: DC

## 2023-12-03 MED ORDER — LORAZEPAM 2 MG/ML IJ SOLN: 1.0000 mg | INTRAMUSCULAR | Status: DC | PRN

## 2023-12-03 MED ORDER — LORAZEPAM 1 MG PO TABS
0.0000 mg | ORAL_TABLET | Freq: Two times a day (BID) | ORAL | Status: DC
  Administered 2023-12-05: 2 mg via ORAL
  Administered 2023-12-06: 1 mg via ORAL
  Filled 2023-12-03: qty 1
  Filled 2023-12-03: qty 2

## 2023-12-03 MED ORDER — TRAZODONE HCL 50 MG PO TABS
100.0000 mg | ORAL_TABLET | Freq: Every day | ORAL | Status: DC
  Administered 2023-12-03 – 2023-12-05 (×3): 100 mg via ORAL
  Filled 2023-12-03 (×3): qty 2

## 2023-12-03 MED ORDER — ATORVASTATIN CALCIUM 40 MG PO TABS
40.0000 mg | ORAL_TABLET | Freq: Every day | ORAL | Status: DC
  Administered 2023-12-04 – 2023-12-06 (×3): 40 mg via ORAL
  Filled 2023-12-03 (×2): qty 1
  Filled 2023-12-03: qty 2

## 2023-12-03 MED ORDER — BUSPIRONE HCL 5 MG PO TABS
10.0000 mg | ORAL_TABLET | Freq: Two times a day (BID) | ORAL | Status: DC
  Administered 2023-12-03 – 2023-12-06 (×6): 10 mg via ORAL
  Filled 2023-12-03 (×6): qty 2

## 2023-12-03 MED ORDER — SODIUM CHLORIDE 0.9 % IV BOLUS
1000.0000 mL | Freq: Once | INTRAVENOUS | Status: AC
  Administered 2023-12-03: 1000 mL via INTRAVENOUS

## 2023-12-03 MED ORDER — GABAPENTIN 100 MG PO CAPS
100.0000 mg | ORAL_CAPSULE | Freq: Three times a day (TID) | ORAL | Status: DC
  Administered 2023-12-03 – 2023-12-05 (×5): 100 mg via ORAL
  Filled 2023-12-03 (×5): qty 1

## 2023-12-03 MED ORDER — LOSARTAN POTASSIUM 50 MG PO TABS
50.0000 mg | ORAL_TABLET | Freq: Every day | ORAL | Status: DC
  Administered 2023-12-04 – 2023-12-06 (×3): 50 mg via ORAL
  Filled 2023-12-03: qty 2
  Filled 2023-12-03 (×2): qty 1

## 2023-12-03 MED ORDER — POTASSIUM CHLORIDE CRYS ER 20 MEQ PO TBCR
40.0000 meq | EXTENDED_RELEASE_TABLET | Freq: Once | ORAL | Status: AC
  Administered 2023-12-03: 40 meq via ORAL
  Filled 2023-12-03: qty 2

## 2023-12-03 MED ORDER — FOLIC ACID 5 MG/ML IJ SOLN
1.0000 mg | Freq: Every day | INTRAMUSCULAR | Status: DC
  Administered 2023-12-04 – 2023-12-05 (×2): 1 mg via INTRAVENOUS
  Filled 2023-12-03 (×4): qty 0.2

## 2023-12-03 MED ORDER — NICOTINE 21 MG/24HR TD PT24
21.0000 mg | MEDICATED_PATCH | Freq: Every day | TRANSDERMAL | Status: DC
  Administered 2023-12-03 – 2023-12-06 (×4): 21 mg via TRANSDERMAL
  Filled 2023-12-03 (×4): qty 1

## 2023-12-03 MED ORDER — THIAMINE HCL 100 MG/ML IJ SOLN
100.0000 mg | Freq: Every day | INTRAMUSCULAR | Status: DC
  Administered 2023-12-03 – 2023-12-06 (×4): 100 mg via INTRAVENOUS
  Filled 2023-12-03 (×4): qty 2

## 2023-12-03 MED ORDER — LORAZEPAM 1 MG PO TABS
0.0000 mg | ORAL_TABLET | Freq: Four times a day (QID) | ORAL | Status: AC
  Administered 2023-12-03 – 2023-12-05 (×5): 1 mg via ORAL
  Filled 2023-12-03: qty 1
  Filled 2023-12-03: qty 2
  Filled 2023-12-03 (×3): qty 1

## 2023-12-03 MED ORDER — AMLODIPINE BESYLATE 5 MG PO TABS
5.0000 mg | ORAL_TABLET | Freq: Every day | ORAL | Status: DC
  Administered 2023-12-04 – 2023-12-06 (×3): 5 mg via ORAL
  Filled 2023-12-03 (×3): qty 1

## 2023-12-03 MED ORDER — LORAZEPAM 1 MG PO TABS
1.0000 mg | ORAL_TABLET | ORAL | Status: DC | PRN
  Administered 2023-12-04: 2 mg via ORAL
  Administered 2023-12-04: 1 mg via ORAL
  Filled 2023-12-03: qty 1
  Filled 2023-12-03: qty 2
  Filled 2023-12-03: qty 1

## 2023-12-03 NOTE — H&P (Signed)
 History and Physical    Patient: Gabriel Koch QMV:[id number] DOB: [birth year] DOA: 12/03/2023 DOS: the patient was seen and examined on 12/03/2023 PCP: Kathyleen Parkins, MD  Patient coming from: Home  Chief Complaint:  Chief Complaint  Patient presents with   Leg Pain   HPI: Gabriel Koch is a 58 y.o. male with medical history significant of alcohol use disorder, history of strokes, COPD, GERD, tobacco abuse.  Patient was admitted at the beginning of the month due to hyponatremia and alcohol use disorder and subarachnoid hemorrhage.  He was discharged on Librium , however he did not continue it.  He presents today due to leg pain.  The patient has been drinking alcohol fairly regularly, although he does not specify the amount.  Labs were drawn in the emergency department.  This showed a sodium of 119.  He has a mild metabolic acidosis.  Review of Systems: As mentioned in the history of present illness. All other systems reviewed and are negative. Past Medical History:  Diagnosis Date   Alcohol abuse    Anxiety and depression    Asthma    Blood transfusion    Complication of anesthesia    patient hemorrrhaged about 2 weeks after tonsillecotmy   COPD (chronic obstructive pulmonary disease) (HCC)    GERD (gastroesophageal reflux disease)    Hypertension    PTSD (post-traumatic stress disorder)    Stroke (HCC) 2021   Past Surgical History:  Procedure Laterality Date   APPENDECTOMY     BIOPSY  04/02/2020   Procedure: BIOPSY;  Surgeon: Vinetta Greening, DO;  [facility]: [facility];  Service: Endoscopy;;   BIOPSY  03/18/2022   Procedure: BIOPSY;  Surgeon: Vinetta Greening, DO;  [facility];  Service: Endoscopy;;   BIOPSY  03/22/2023   Procedure: BIOPSY;  Surgeon: Vinetta Greening, DO;  [facility];  Service: Endoscopy;;   COLONOSCOPY WITH PROPOFOL  N/A 04/02/2020   Procedure: COLONOSCOPY WITH PROPOFOL ;  Surgeon: Vinetta Greening, DO;  [facility];  Service: Endoscopy;  Laterality: N/A;  8:30am   COLONOSCOPY WITH PROPOFOL  N/A 03/18/2022   Surgeon: Vinetta Greening, DO; Nonbleeding internal hemorrhoids, multiple diverticula in the sigmoid colon, segmental area of moderately friable mucosa with spontaneous bleeding in the transverse and ascending colon biopsied. Pathology with no specific histopathologic change. Recommended 5 year surveillance.   COLONOSCOPY WITH PROPOFOL  N/A 03/22/2023   Nonbleeding internal hemorrhoids, multiple sigmoid diverticula, normal TI s/p biopsy, localized area of mildly friable mucosa with contact bleeding in ascending colon and cecum.  Biopsies taken from entire colon.  TI biopsy benign.  Mild reactive/reparative change in in [facility] and TC.  Minimal increased intraepithelial lymphocytes and descending colon, nonspecific.  Sigmoid and rectum biopsy benign.   ESOPHAGOGASTRODUODENOSCOPY (EGD) WITH PROPOFOL  N/A 04/02/2020   Procedure: ESOPHAGOGASTRODUODENOSCOPY (EGD) WITH PROPOFOL ;  Surgeon: Vinetta Greening, DO;  [facility];  Service: Endoscopy;  Laterality: N/A;   ESOPHAGOGASTRODUODENOSCOPY (EGD) WITH PROPOFOL  N/A 03/21/2021   Procedure: ESOPHAGOGASTRODUODENOSCOPY (EGD) WITH PROPOFOL ;  Surgeon: Vinetta Greening, DO;  [facility];  Service: Endoscopy;  Laterality: N/A;  11:00am   ESOPHAGOGASTRODUODENOSCOPY (EGD) WITH PROPOFOL  N/A 03/18/2022   3 cm hiatal hernia, short segment Barrett's biopsied, gastritis biopsied, duodenal scar/?  Residual polyp tissue that was resected.  Pathology with mild nonspecific reactive gastropathy, negative for H. 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  Surveillance in 5 years.   POLYPECTOMY  04/02/2020  Procedure: POLYPECTOMY;  Surgeon: Vinetta Greening, DO;  [facility];  Service: Endoscopy;;   POLYPECTOMY  03/21/2021   Procedure: POLYPECTOMY;  Surgeon: Vinetta Greening, DO;  [facility];   Service: Endoscopy;;  duodenal   POLYPECTOMY  03/18/2022   Procedure: POLYPECTOMY;  Surgeon: Vinetta Greening, DO;  [facility];  Service: Endoscopy;;   REPAIR EXTENSOR TENDON Left 08/06/2022   Procedure: LEFT LONG FINGER EXTENSOR TENDON CENTRALIZATION;  Surgeon: Brunilda Capra, MD;  Location: [facility];  Service: Orthopedics;  Laterality: Left;  60 MIN   STERIOD INJECTION Left 08/06/2022   Procedure: LEFT CARPAL TUNNEL INJECTION;  Surgeon: Brunilda Capra, MD;  Location: [facility];  Service: Orthopedics;  Laterality: Left;   TONSILLECTOMY     Social History:  reports that he has been smoking cigars and cigarettes. He has a 30 pack-year smoking history. He has never used smokeless tobacco. He reports current alcohol use of about 2.0 standard drinks of alcohol per week. He reports that he does not use drugs.  Allergies  Allergen Reactions   Bee Venom Anaphylaxis    Family History  Problem Relation Age of Onset   Colon cancer Neg Hx        Pt knows little about family history    Prior to Admission medications   Medication Sig Start Date End Date Taking? Authorizing Provider  acetaminophen  (TYLENOL ) 500 MG tablet Take 1,000 mg by mouth every 6 (six) hours as needed for mild pain (pain score 1-3) or moderate pain (pain score 4-6).   Yes [provider]  albuterol  (VENTOLIN  HFA) 108 (90 Base) MCG/ACT inhaler Inhale 1 puff into the lungs every 4 (four) hours as needed for wheezing or shortness of breath. 01/18/23  Yes Emokpae, Courage, MD  amLODipine  (NORVASC ) 5 MG tablet Take 5 mg by mouth daily.   Yes [provider]  atorvastatin  (LIPITOR) 40 MG tablet Take 40 mg by mouth daily.   Yes [provider]  buPROPion  (WELLBUTRIN ) 75 MG tablet Take 2 tablets (150 mg total) by mouth 2 (two) times daily. 01/18/23  Yes Emokpae, Courage, MD  busPIRone  (BUSPAR ) 10 MG tablet Take 1 tablet (10 mg total) by mouth 2 (two) times daily. 01/18/23   Yes Colin Dawley, MD  esomeprazole  (NEXIUM ) 40 MG capsule Take 1 capsule (40 mg total) by mouth 2 (two) times daily before a meal. 11/15/23  Yes Shana Daring S, PA-C  furosemide (LASIX) 20 MG tablet Take 20 mg by mouth daily.   Yes [provider]  gabapentin (NEURONTIN) 100 MG capsule Take 100 mg by mouth 3 (three) times daily.   Yes [provider]  losartan  (COZAAR ) 50 MG tablet Take 50 mg by mouth daily.   Yes [provider]  meloxicam (MOBIC) 7.5 MG tablet Take 7.5 mg by mouth daily.   Yes [provider]  traZODone  (DESYREL ) 100 MG tablet Take 1 tablet (100 mg total) by mouth at bedtime. 01/18/23  Yes Emokpae, Courage, MD  zolpidem (AMBIEN) 10 MG tablet Take 10 mg by mouth at bedtime.   Yes [provider]  acetaminophen  (TYLENOL ) 325 MG tablet Take 2 tablets (650 mg total) by mouth every 6 (six) hours as needed for mild pain (pain score 1-3) (or Fever >/= 101). Patient not taking: Reported on 12/03/2023 11/05/23   Haydee Lipa, MD  diclofenac Sodium (VOLTAREN) 1 % GEL Apply 2 g topically 4 (four) times daily. Patient not  taking: Reported on 12/03/2023    [provider]  folic acid  (FOLVITE ) 1 MG tablet Take 1 tablet (1 mg total) by mouth daily. Patient not taking: Reported on 12/03/2023 11/06/23   Haydee Lipa, MD  HYDROcodone -acetaminophen  (NORCO/VICODIN) 5-325 MG tablet Take 1 tablet by mouth every 6 (six) hours as needed for moderate pain (pain score 4-6) or severe pain (pain score 7-10). 11/05/23   Haydee Lipa, MD  levETIRAcetam  (KEPPRA  XR) 500 MG 24 hr tablet Take 1 tablet (500 mg total) by mouth every 12 (twelve) hours for 6 days. Patient not taking: Reported on 12/03/2023 11/05/23 11/11/23  Haydee Lipa, MD  lidocaine  (LIDODERM ) 5 % Place 1 patch onto the skin daily. Remove & Discard patch within 12 hours or as directed by MD Patient not taking: Reported on 12/03/2023 04/29/23   Evander Hills, PA-C   Multiple Vitamin (MULTIVITAMIN WITH MINERALS) TABS tablet Take 1 tablet by mouth daily. Patient not taking: Reported on 12/03/2023 11/06/23   Haydee Lipa, MD  nicotine  (NICODERM CQ  - DOSED IN MG/24 HOURS) 14 mg/24hr patch Place 1 patch (14 mg total) onto the skin daily. Patient not taking: Reported on 12/03/2023 11/06/23   Haydee Lipa, MD  polyethylene glycol powder (GLYCOLAX /MIRALAX ) 17 GM/SCOOP powder Take 1 capful (17 g) by mouth daily as needed for mild constipation. Patient not taking: Reported on 12/03/2023 11/05/23   Haydee Lipa, MD  thiamine  (VITAMIN B1) 100 MG tablet Take 1 tablet (100 mg total) by mouth daily. Patient not taking: Reported on 12/03/2023 11/06/23   Haydee Lipa, MD    Physical Exam: Vitals:   12/03/23 1526 12/03/23 1526 12/03/23 1800  BP:  (!) 150/92 (!) 143/85  Pulse:  (!) 106 97  Resp:  16   Temp:  98 F (36.7 C)   SpO2:  100% 100%  Weight: 59.2 kg    Height: 5\' 8"  (1.727 m)     General: Middle-age male. Awake and alert and oriented x3. No acute cardiopulmonary distress.  HEENT: Normocephalic atraumatic.  Right and left ears normal in appearance.  Pupils equal, round, reactive to light. Extraocular muscles are intact. Sclerae anicteric and noninjected.  Moist mucosal membranes. No mucosal lesions.  Neck: Neck supple without lymphadenopathy. No carotid bruits. No masses palpated.  Cardiovascular: Regular rate with normal S1-S2 sounds. No murmurs, rubs, gallops auscultated. No JVD.  Respiratory: Good respiratory effort with no wheezes, rales, rhonchi. Lungs clear to auscultation bilaterally.  No accessory muscle use. Abdomen: Soft, nontender, nondistended. Active bowel sounds. No masses or hepatosplenomegaly  Skin: No rashes, lesions, or ulcerations.  Dry, warm to touch. 2+ dorsalis pedis and radial pulses. Musculoskeletal: Legs quite tender to palpation. All major joints not erythematous nontender.  No upper or lower joint deformation.   Good ROM.  No contractures  Psychiatric: Intact judgment and insight. Pleasant and cooperative. Neurologic: No focal neurological deficits. Strength is 5/5 and symmetric in upper and lower extremities.  Cranial nerves II through XII are grossly intact.  Data Reviewed: Results for orders placed or performed during the hospital encounter of 12/03/23 (from the past 24 hours)  CBC with Differential     Status: Abnormal   Collection Time: 12/03/23  3:45 PM  Result Value Ref Range   WBC 3.4 (L) 4.0 - 10.5 K/uL   RBC 2.50 (L) 4.22 - 5.81 MIL/uL   Hemoglobin 10.1 (L) 13.0 - 17.0 g/dL   HCT 40.9 (L) 81.1 - 91.4 %   MCV  110.4 (H) 80.0 - 100.0 fL   MCH 40.4 (H) 26.0 - 34.0 pg   MCHC 36.6 (H) 30.0 - 36.0 g/dL   RDW 81.4 48.1 - 85.6 %   Platelets 105 (L) 150 - 400 K/uL   nRBC 0.0 0.0 - 0.2 %   Neutrophils Relative % 66 %   Neutro Abs 2.2 1.7 - 7.7 K/uL   Lymphocytes Relative 26 %   Lymphs Abs 0.9 0.7 - 4.0 K/uL   Monocytes Relative 8 %   Monocytes Absolute 0.3 0.1 - 1.0 K/uL   Eosinophils Relative 0 %   Eosinophils Absolute 0.0 0.0 - 0.5 K/uL   Basophils Relative 0 %   Basophils Absolute 0.0 0.0 - 0.1 K/uL   WBC Morphology MORPHOLOGY UNREMARKABLE    RBC Morphology See Note    Smear Review Normal platelet morphology    Immature Granulocytes 0 %   Abs Immature Granulocytes 0.01 0.00 - 0.07 K/uL   Ovalocytes PRESENT   Comprehensive metabolic panel     Status: Abnormal   Collection Time: 12/03/23  3:45 PM  Result Value Ref Range   Sodium 119 (LL) 135 - 145 mmol/L   Potassium 3.5 3.5 - 5.1 mmol/L   Chloride 85 (L) 98 - 111 mmol/L   CO2 20 (L) 22 - 32 mmol/L   Glucose, Bld 111 (H) 70 - 99 mg/dL   BUN <5 (L) 6 - 20 mg/dL   Creatinine, Ser 3.14 0.61 - 1.24 mg/dL   Calcium  8.5 (L) 8.9 - 10.3 mg/dL   Total Protein 7.4 6.5 - 8.1 g/dL   Albumin 3.0 (L) 3.5 - 5.0 g/dL   AST 86 (H) 15 - 41 U/L   ALT 34 0 - 44 U/L   Alkaline Phosphatase 238 (H) 38 - 126 U/L   Total Bilirubin 0.5 0.0 - 1.2  mg/dL   GFR, Estimated >97 >02 mL/min   Anion gap 14 5 - 15  Urinalysis, Routine w reflex microscopic -Urine, Clean Catch     Status: Abnormal   Collection Time: 12/03/23  5:20 PM  Result Value Ref Range   Color, Urine STRAW (A) YELLOW   APPearance CLEAR CLEAR   Specific Gravity, Urine 1.003 (L) 1.005 - 1.030   pH 5.0 5.0 - 8.0   Glucose, UA NEGATIVE NEGATIVE mg/dL   Hgb urine dipstick SMALL (A) NEGATIVE   Bilirubin Urine NEGATIVE NEGATIVE   Ketones, ur NEGATIVE NEGATIVE mg/dL   Protein, ur NEGATIVE NEGATIVE mg/dL   Nitrite NEGATIVE NEGATIVE   Leukocytes,Ua NEGATIVE NEGATIVE   RBC / HPF 0-5 0 - 5 RBC/hpf   WBC, UA 0-5 0 - 5 WBC/hpf   Bacteria, UA NONE SEEN NONE SEEN   Squamous Epithelial / HPF 0-5 0 - 5 /HPF    No results found.   Assessment and Plan: No notes have been filed under this hospital service. Service: Hospitalist  Principal Problem:   Hyponatremia Active Problems:   Alcohol abuse   HTN (hypertension)  Hyponatremia Admit to stepdown This is likely secondary to beer Potomania due to poor oral intake and malnutrition. IV fluids Thiamine , folic acid  Check sodium level every 4 hours and check BMP tomorrow morning Alcohol abuse CIWA protocol Thiamine , folic acid  Hypertension Continue antihypertensives History of stroke  Recent subarachnoid hemorrhage No sequela No longer on Keppra    Advance Care Planning:   Code Status: Limited: Do not attempt resuscitation (DNR) -DNR-LIMITED -Do Not Intubate/DNI confirmed by patient  Consults: None  Family Communication: None  Severity  of Illness: The appropriate patient status for this patient is INPATIENT. Inpatient status is judged to be reasonable and necessary in order to provide the required intensity of service to ensure the patient's safety. The patient's presenting symptoms, physical exam findings, and initial radiographic and laboratory data in the context of their chronic comorbidities is felt to place  them at high risk for further clinical deterioration. Furthermore, it is not anticipated that the patient will be medically stable for discharge from the hospital within 2 midnights of admission.   * I certify that at the point of admission it is my clinical judgment that the patient will require inpatient hospital care spanning beyond 2 midnights from the point of admission due to high intensity of service, high risk for further deterioration and high frequency of surveillance required.*  Author: Jazz Biddy J Brienna Bass, DO 12/03/2023 6:57 PM  For on call review [email or web address].

## 2023-12-03 NOTE — ED Triage Notes (Signed)
 Pt arrived via POV c/o bilateral leg pain and feet pain X 2 weeks. Pt observed talking to himself in Triage, but is easily redirectable to answer Triage questions. Pt presents with multiple sites of redness on bruising on his extremities.

## 2023-12-03 NOTE — ED Provider Notes (Signed)
  EMERGENCY DEPARTMENT AT [facility] Provider Note   CSN: [id number] Arrival date & time: 12/03/23  1515     History  Chief Complaint  Patient presents with   Leg Pain    Gabriel Koch is a 58 y.o. male.  He is here with complaint of bilateral leg pain and swelling that have been going on for a week or 2.  He said this is a new problem.  Worse with ambulation.  Has had some falls.  Has been taking his medication regularly.  Does endorse regular alcohol and tobacco use.  The history is provided by the patient.  Leg Pain Location:  Leg Time since incident:  2 weeks Injury: no   Leg location:  L lower leg and R lower leg Pain details:    Quality:  Aching   Severity:  Severe   Onset quality:  Gradual   Timing:  Constant   Progression:  Worsening Chronicity:  New Relieved by:  Nothing Worsened by:  Bearing weight Ineffective treatments:  Rest Associated symptoms: swelling and tingling   Associated symptoms: no fever        Home Medications Prior to Admission medications   Medication Sig Start Date End Date Taking? Authorizing Provider  acetaminophen  (TYLENOL ) 325 MG tablet Take 2 tablets (650 mg total) by mouth every 6 (six) hours as needed for mild pain (pain score 1-3) (or Fever >/= 101). 11/05/23   Haydee Lipa, MD  albuterol  (VENTOLIN  HFA) 108 [phone] Base) MCG/ACT inhaler Inhale 1 puff into the lungs every 4 (four) hours as needed for wheezing or shortness of breath. 01/18/23   Colin Dawley, MD  buPROPion  (WELLBUTRIN ) 75 MG tablet Take 2 tablets (150 mg total) by mouth 2 (two) times daily. 01/18/23   Colin Dawley, MD  busPIRone  (BUSPAR ) 10 MG tablet Take 1 tablet (10 mg total) by mouth 2 (two) times daily. 01/18/23   Colin Dawley, MD  diclofenac Sodium (VOLTAREN) 1 % GEL Apply 2 g topically 4 (four) times daily.    [provider]  esomeprazole  (NEXIUM ) 40 MG capsule Take 1 capsule (40 mg total) by mouth 2 (two) times daily  before a meal. 11/15/23   Evander Hills, PA-C  folic acid  (FOLVITE ) 1 MG tablet Take 1 tablet (1 mg total) by mouth daily. 11/06/23   Haydee Lipa, MD  HYDROcodone -acetaminophen  (NORCO/VICODIN) 5-325 MG tablet Take 1 tablet by mouth every 6 (six) hours as needed for moderate pain (pain score 4-6) or severe pain (pain score 7-10). 11/05/23   Haydee Lipa, MD  levETIRAcetam  (KEPPRA  XR) 500 MG 24 hr tablet Take 1 tablet (500 mg total) by mouth every 12 (twelve) hours for 6 days. 11/05/23 11/11/23  Haydee Lipa, MD  lidocaine  (LIDODERM ) 5 % Place 1 patch onto the skin daily. Remove & Discard patch within 12 hours or as directed by MD 04/29/23   Evander Hills, PA-C  Multiple Vitamin (MULTIVITAMIN WITH MINERALS) TABS tablet Take 1 tablet by mouth daily. 11/06/23   Haydee Lipa, MD  nicotine  (NICODERM CQ  - DOSED IN MG/24 HOURS) 14 mg/24hr patch Place 1 patch (14 mg total) onto the skin daily. 11/06/23   Haydee Lipa, MD  polyethylene glycol powder (GLYCOLAX /MIRALAX ) 17 GM/SCOOP powder Take 1 capful (17 g) by mouth daily as needed for mild constipation. 11/05/23   Haydee Lipa, MD  thiamine  (VITAMIN B1) 100 MG tablet Take 1 tablet (100 mg total) by mouth daily. 11/06/23  Haydee Lipa, MD  traZODone  (DESYREL ) 100 MG tablet Take 1 tablet (100 mg total) by mouth at bedtime. 01/18/23   Colin Dawley, MD      Allergies    Bee venom    Review of Systems   Review of Systems  Constitutional:  Negative for fever.    Physical Exam Updated Vital Signs BP (!) 150/92 (BP Location: Right Arm)   Pulse (!) 106   Temp 98 F (36.7 C)   Resp 16   Ht 5\' 8"  (1.727 m)   Wt 59.2 kg   SpO2 100%   BMI 19.84 kg/m  Physical Exam Vitals and nursing note reviewed.  Constitutional:      General: He is not in acute distress.    Appearance: Normal appearance. He is well-developed.  HENT:     Head: Normocephalic and atraumatic.  Eyes:     Conjunctiva/sclera:  Conjunctivae normal.  Cardiovascular:     Rate and Rhythm: Regular rhythm. Tachycardia present.     Heart sounds: No murmur heard. Pulmonary:     Effort: Pulmonary effort is normal. No respiratory distress.     Breath sounds: Normal breath sounds.  Abdominal:     Palpations: Abdomen is soft.     Tenderness: There is abdominal tenderness (generalized).  Musculoskeletal:        General: Tenderness present.     Cervical back: Neck supple.     Right lower leg: Edema present.     Left lower leg: Edema present.     Comments: He has diffuse tenderness of both of his lower extremities.  He has good distal pulses.  No obvious cords.  Skin:    General: Skin is warm and dry.     Capillary Refill: Capillary refill takes less than 2 seconds.  Neurological:     General: No focal deficit present.     Mental Status: He is alert.     Motor: No weakness.     ED Results / Procedures / Treatments   Labs (all labs ordered are listed, but only abnormal results are displayed) Labs Reviewed  CBC WITH DIFFERENTIAL/PLATELET - Abnormal; Notable for the following components:      Result Value   WBC 3.4 (*)    RBC 2.50 (*)    Hemoglobin 10.1 (*)    HCT 27.6 (*)    MCV 110.4 (*)    MCH 40.4 (*)    MCHC 36.6 (*)    Platelets 105 (*)    All other components within normal limits  COMPREHENSIVE METABOLIC PANEL WITH GFR - Abnormal; Notable for the following components:   Sodium 119 (*)    Chloride 85 (*)    CO2 20 (*)    Glucose, Bld 111 (*)    BUN <5 (*)    Calcium  8.5 (*)    Albumin 3.0 (*)    AST 86 (*)    Alkaline Phosphatase 238 (*)    All other components within normal limits  URINALYSIS, ROUTINE W REFLEX MICROSCOPIC - Abnormal; Notable for the following components:   Color, Urine STRAW (*)    Specific Gravity, Urine 1.003 (*)    Hgb urine dipstick SMALL (*)    All other components within normal limits  SODIUM - Abnormal; Notable for the following components:   Sodium 124 (*)    All  other components within normal limits  SODIUM - Abnormal; Notable for the following components:   Sodium 129 (*)    All other components  within normal limits  SODIUM - Abnormal; Notable for the following components:   Sodium 132 (*)    All other components within normal limits  BASIC METABOLIC PANEL WITH GFR - Abnormal; Notable for the following components:   Sodium 133 (*)    CO2 21 (*)    BUN <5 (*)    Calcium  8.4 (*)    All other components within normal limits  IRON AND TIBC - Abnormal; Notable for the following components:   Saturation Ratios 15 (*)    All other components within normal limits  RETICULOCYTES - Abnormal; Notable for the following components:   RBC. 2.38 (*)    All other components within normal limits  VITAMIN B12  FOLATE  FERRITIN  SODIUM    EKG None  Radiology No results found.  Procedures Procedures    Medications Ordered in ED Medications  thiamine  (VITAMIN B1) injection 100 mg (100 mg Intravenous Given 12/03/23 1810)  folic acid  injection 1 mg (has no administration in time range)  nicotine  (NICODERM CQ  - dosed in mg/24 hours) patch 21 mg (21 mg Transdermal Patch Applied 12/03/23 1810)  LORazepam  (ATIVAN ) tablet 1-4 mg (2 mg Oral Given 12/04/23 0411)    Or  LORazepam  (ATIVAN ) injection 1-4 mg ( Intravenous See Alternative 12/04/23 0411)  multivitamin with minerals tablet 1 tablet (1 tablet Oral Given 12/03/23 1809)  LORazepam  (ATIVAN ) tablet 0-4 mg (0 mg Oral Patient Refused/Not Given 12/04/23 0602)    Followed by  LORazepam  (ATIVAN ) tablet 0-4 mg (has no administration in time range)  HYDROcodone -acetaminophen  (NORCO/VICODIN) 5-325 MG per tablet 1 tablet (has no administration in time range)  naproxen  (NAPROSYN ) tablet 250 mg (250 mg Oral Given 12/04/23 0812)  amLODipine  (NORVASC ) tablet 5 mg (has no administration in time range)  atorvastatin  (LIPITOR) tablet 40 mg (has no administration in time range)  losartan  (COZAAR ) tablet 50 mg (has no  administration in time range)  buPROPion  (WELLBUTRIN ) tablet 150 mg (has no administration in time range)  busPIRone  (BUSPAR ) tablet 10 mg (10 mg Oral Given 12/03/23 2125)  traZODone  (DESYREL ) tablet 100 mg (100 mg Oral Given 12/03/23 2125)  pantoprazole  (PROTONIX ) EC tablet 40 mg (has no administration in time range)  gabapentin  (NEURONTIN ) capsule 100 mg (100 mg Oral Given 12/03/23 2125)  enoxaparin  (LOVENOX ) injection 40 mg (40 mg Subcutaneous Given 12/03/23 2126)  potassium chloride  SA (KLOR-CON  M) CR tablet 40 mEq (40 mEq Oral Given 12/03/23 1810)  sodium chloride  0.9 % bolus 1,000 mL (0 mLs Intravenous Stopped 12/03/23 1908)    ED Course/ Medical Decision Making/ A&P Clinical Course as of 12/04/23 1004  Fri Dec 03, 2023  1735 Discussed with Dr. Cathyann Cobia [facility] hospitalist who will evaluate patient for admission.  I reviewed results of workup with patient and he is agreeable to plan for admission. [MB]    Clinical Course User Index [MB] Tonya Fredrickson, MD                                 Medical Decision Making Amount and/or Complexity of Data Reviewed Labs: ordered.  Risk Prescription drug management. Decision regarding hospitalization.   This patient complains of pain and swelling in his legs general weakness; this involves an extensive number of treatment Options and is a complaint that carries with it a high risk of complications and morbidity. The differential includes DVT, peripheral edema, metabolic derangement, hypoalbuminemia  I ordered, reviewed and interpreted labs,  which included CBC with pancytopenia similar to priors, chemistries with critically low sodium, urinalysis with low spec gravity of Previous records obtained and reviewed in epic, patient admitted last month for hyponatremia I consulted [facility] hospitalist Dr. Cathyann Cobia and discussed lab and imaging findings and discussed disposition.  Cardiac monitoring reviewed, sinus rhythm Social determinants considered,  tobacco use Critical Interventions: None  After the interventions stated above, I reevaluated the patient and found patient to be fairly asymptomatic at rest Admission and further testing considered, he would benefit from mission to the hospital for correction of his hyponatremia with also concern for possible alcohol withdrawal.         Final Clinical Impression(s) / ED Diagnoses Final diagnoses:  Hyponatremia  Bilateral leg pain  Uncomplicated alcohol dependence (HCC)  Pancytopenia (HCC)    Rx / DC Orders ED Discharge Orders     None         Tonya Fredrickson, MD 12/04/23 1007

## 2023-12-03 NOTE — ED Notes (Signed)
 Patient ambulated to restroom with cane and standby assist. Patient back in bed extra pillow and warm blanket given.

## 2023-12-03 NOTE — ED Triage Notes (Signed)
 Pt reports he has been drinking ETOH today. Pt does have slurred speech in Triage.

## 2023-12-03 NOTE — ED Notes (Signed)
 Lab called with a critical of NA 119

## 2023-12-04 DIAGNOSIS — E871 Hypo-osmolality and hyponatremia: Secondary | ICD-10-CM | POA: Diagnosis not present

## 2023-12-04 LAB — BASIC METABOLIC PANEL WITH GFR
Anion gap: 8 (ref 5–15)
BUN: <5 mg/dL — ABNORMAL LOW (ref 6–20)
CO2: 21 mmol/L — ABNORMAL LOW (ref 22–32)
Calcium: 8.4 mg/dL — ABNORMAL LOW (ref 8.9–10.3)
Chloride: 104 mmol/L (ref 98–111)
Creatinine, Ser: 0.63 mg/dL (ref 0.61–1.24)
GFR, Estimated: >60 mL/min (ref >60)
Glucose, Bld: 87 mg/dL (ref 70–99)
Potassium: 4 mmol/L (ref 3.5–5.1)
Sodium: 133 mmol/L — ABNORMAL LOW (ref 135–145)

## 2023-12-04 LAB — RETICULOCYTES
Immature Retic Fract: 12.3 % (ref 2.3–15.9)
RBC.: 2.38 MIL/uL — ABNORMAL LOW (ref 4.22–5.81)
Retic Count, Absolute: 49.5 10*3/uL (ref 19.0–186.0)
Retic Ct Pct: 2.1 % (ref 0.4–3.1)

## 2023-12-04 LAB — SODIUM
Sodium: 129 mmol/L — ABNORMAL LOW (ref 135–145)
Sodium: 132 mmol/L — ABNORMAL LOW (ref 135–145)

## 2023-12-04 LAB — IRON AND TIBC
Iron: 53 ug/dL (ref 45–182)
Saturation Ratios: 15 % — ABNORMAL LOW (ref 17.9–39.5)
TIBC: 366 ug/dL (ref 250–450)
UIBC: 313 ug/dL

## 2023-12-04 LAB — FERRITIN: Ferritin: 35 ng/mL (ref 24–336)

## 2023-12-04 LAB — VITAMIN B12: Vitamin B-12: 366 pg/mL (ref 180–914)

## 2023-12-04 LAB — FOLATE: Folate: 21 ng/mL

## 2023-12-04 NOTE — ED Notes (Signed)
 Pt is resting at this time. Unable to complete CIWA

## 2023-12-04 NOTE — Progress Notes (Signed)
 Attempted to call patient's aunt that are in his chart to let them know what room the patient is in. No answer.

## 2023-12-04 NOTE — ED Notes (Signed)
 Pt ambulated to bathroom with cane and 1 A staff

## 2023-12-04 NOTE — Progress Notes (Signed)
 Patient with multiple bruises all over body. Circular bruise to left chest/rib area, patient reports previously being larger. When asked about bruising he states he doesn't know where they come from, that they "appear". Patient also reported pain to that left side/back area, and states that it has been ongoing.

## 2023-12-04 NOTE — Progress Notes (Signed)
 Attempted to call and receive report. Nurse states she will call back.

## 2023-12-04 NOTE — Progress Notes (Signed)
 PROGRESS NOTE    Gabriel Koch  KGM:[id number] DOB: [birth year] DOA: 12/03/2023 PCP: Kathyleen Parkins, MD   Brief Narrative:    Gabriel Koch is a 58 y.o. male with medical history significant of alcohol use disorder, history of strokes, COPD, GERD, tobacco abuse.  Patient was admitted at the beginning of the month due to hyponatremia and alcohol use disorder and subarachnoid hemorrhage.  He was discharged on Librium , however he did not continue it.  He presents today due to leg pain.  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  Assessment & Plan:   Principal Problem:   Hyponatremia Active Problems:   Alcohol abuse   HTN (hypertension)  Assessment and Plan:   Hyponatremia-improved This is likely secondary to beer Potomania due to poor oral intake and malnutrition. Discontinue further IV fluid and monitor Thiamine , folic acid  Check BMP in a.m. Alcohol abuse with related peripheral neuropathy CIWA protocol Thiamine , folic acid  Anemia panel unremarkable Currently on gabapentin  PT/OT evaluation pending Hypertension Continue antihypertensives History of stroke   Recent subarachnoid hemorrhage secondary to fall in the setting of alcohol intoxication No sequela No longer on Keppra  Has not followed up with neurosurgery Fall precautions and PT/OT evaluation    DVT prophylaxis: Lovenox  to SCDs Code Status: Full Family Communication: None at bedside Disposition Plan:  Status is: Inpatient Remains inpatient appropriate because: Need for IV medications.   Consultants:  None  Procedures:  None  Antimicrobials:  None   Subjective: Patient seen and evaluated today with no new acute complaints or concerns. No acute concerns or events noted overnight.  He continues to feel quite weak and fatigued and complains of bilateral leg pain with  burning.  Objective: Vitals:   12/04/23 0830 12/04/23 0900 12/04/23 0930 12/04/23 1055  BP: (!) 154/94 (!) 144/79 (!) 143/78 (!) 153/87  Pulse: (!) 105 100 90 98  Resp: 20 15 15    Temp:    98.1 F (36.7 C)  TempSrc:    Oral  SpO2: 98% 96% 97% 100%  Weight:      Height:       No intake or output data in the 24 hours ending 12/04/23 1127 Filed Weights   12/03/23 1526  Weight: 59.2 kg    Examination:  General exam: Appears calm and comfortable  Respiratory system: Clear to auscultation. Respiratory effort normal. Cardiovascular system: S1 & S2 heard, RRR.  Gastrointestinal system: Abdomen is soft Central nervous system: Alert and awake Extremities: No edema Skin: No significant lesions noted Psychiatry: Flat affect.    Data Reviewed: I have personally reviewed following labs and imaging studies  CBC: Recent Labs  Lab 12/03/23 1545  WBC 3.4*  NEUTROABS 2.2  HGB 10.1*  HCT 27.6*  MCV 110.4*  PLT 105*   Basic Metabolic Panel: Recent Labs  Lab 12/03/23 1545 12/03/23 2106 12/04/23 0009 12/04/23 0342 12/04/23 0800  NA 119* 124* 129* 133* 132*  K 3.5  --   --  4.0  --   CL 85*  --   --  104  --   CO2 20*  --   --  21*  --   GLUCOSE 111*  --   --  87  --   BUN <5*  --   --  <5*  --   CREATININE 0.67  --   --  0.63  --   CALCIUM  8.5*  --   --  8.4*  --    GFR: Estimated Creatinine Clearance: 85.3 mL/min (by C-G formula based on SCr of 0.63 mg/dL). Liver Function Tests: Recent Labs  Lab 12/03/23 1545  AST 86*  ALT 34  ALKPHOS 238*  BILITOT 0.5  PROT 7.4  ALBUMIN 3.0*   No results for input(s): "LIPASE", "AMYLASE" in the last 168 hours. No results for input(s): "AMMONIA" in the last 168 hours. Coagulation Profile: No results for input(s): "INR", "PROTIME" in the last 168 hours. Cardiac Enzymes: No results for input(s): "CKTOTAL", "CKMB", "CKMBINDEX", "TROPONINI" in the last 168 hours. BNP (last 3 results) No results for input(s): "PROBNP" in the  last 8760 hours. HbA1C: No results for input(s): "HGBA1C" in the last 72 hours. CBG: No results for input(s): "GLUCAP" in the last 168 hours. Lipid Profile: No results for input(s): "CHOL", "HDL", "LDLCALC", "TRIG", "CHOLHDL", "LDLDIRECT" in the last 72 hours. Thyroid  Function Tests: No results for input(s): "TSH", "T4TOTAL", "FREET4", "T3FREE", "THYROIDAB" in the last 72 hours. Anemia Panel: Recent Labs    12/04/23 0800  VITAMINB12 366  FOLATE 21.0  FERRITIN 35  TIBC 366  IRON 53  RETICCTPCT 2.1   Sepsis Labs: No results for input(s): "PROCALCITON", "LATICACIDVEN" in the last 168 hours.  No results found for this or any previous visit (from the past 240 hours).       Radiology Studies: No results found.      Scheduled Meds:  amLODipine   5 mg Oral Daily   atorvastatin   40 mg Oral Daily   buPROPion   150 mg Oral BID   busPIRone   10 mg Oral BID   enoxaparin  (LOVENOX ) injection  40 mg Subcutaneous Q24H   folic acid   1 mg Intravenous Daily   gabapentin   100 mg Oral TID   LORazepam   0-4 mg Oral Q6H   Followed by   Cecily Cohen ON 12/05/2023] LORazepam   0-4 mg Oral Q12H   losartan   50 mg Oral Daily   multivitamin with minerals  1 tablet Oral Daily   naproxen   250 mg Oral TID WC   nicotine   21 mg Transdermal Daily   pantoprazole   40 mg Oral Daily   thiamine  (VITAMIN B1) injection  100 mg Intravenous Daily   traZODone   100 mg Oral QHS     LOS: 1 day    Time spent: 55 minutes    Demichael Traum Loran Rock, DO [facility]  If 7PM-7AM, please contact night-coverage [email or web address] 12/04/2023, 11:27 AM

## 2023-12-05 DIAGNOSIS — E871 Hypo-osmolality and hyponatremia: Secondary | ICD-10-CM | POA: Diagnosis not present

## 2023-12-05 LAB — BASIC METABOLIC PANEL WITH GFR
Anion gap: 7 (ref 5–15)
BUN: 5 mg/dL — ABNORMAL LOW (ref 6–20)
CO2: 24 mmol/L (ref 22–32)
Calcium: 8.2 mg/dL — ABNORMAL LOW (ref 8.9–10.3)
Chloride: 102 mmol/L (ref 98–111)
Creatinine, Ser: 0.7 mg/dL (ref 0.61–1.24)
GFR, Estimated: >60 mL/min (ref >60)
Glucose, Bld: 96 mg/dL (ref 70–99)
Potassium: 3.6 mmol/L (ref 3.5–5.1)
Sodium: 133 mmol/L — ABNORMAL LOW (ref 135–145)

## 2023-12-05 LAB — CBC
HCT: 23.5 % — ABNORMAL LOW (ref 39.0–52.0)
Hemoglobin: 7.9 g/dL — ABNORMAL LOW (ref 13.0–17.0)
MCH: 38.3 pg — ABNORMAL HIGH (ref 26.0–34.0)
MCHC: 33.6 g/dL (ref 30.0–36.0)
MCV: 114.1 fL — ABNORMAL HIGH (ref 80.0–100.0)
Platelets: 69 10*3/uL — ABNORMAL LOW (ref 150–400)
RBC: 2.06 MIL/uL — ABNORMAL LOW (ref 4.22–5.81)
RDW: 14.6 % (ref 11.5–15.5)
WBC: 1.6 10*3/uL — ABNORMAL LOW (ref 4.0–10.5)
nRBC: 0 % (ref 0.0–0.2)

## 2023-12-05 LAB — MAGNESIUM: Magnesium: 1.4 mg/dL — ABNORMAL LOW (ref 1.7–2.4)

## 2023-12-05 MED ORDER — GABAPENTIN 100 MG PO CAPS
200.0000 mg | ORAL_CAPSULE | Freq: Three times a day (TID) | ORAL | Status: DC
  Administered 2023-12-05 – 2023-12-06 (×3): 200 mg via ORAL
  Filled 2023-12-05 (×3): qty 2

## 2023-12-05 MED ORDER — MAGNESIUM SULFATE 2 GM/50ML IV SOLN
2.0000 g | Freq: Once | INTRAVENOUS | Status: AC
  Administered 2023-12-05: 2 g via INTRAVENOUS
  Filled 2023-12-05: qty 50

## 2023-12-05 MED ORDER — LORATADINE 10 MG PO TABS
10.0000 mg | ORAL_TABLET | Freq: Every day | ORAL | Status: DC | PRN
  Administered 2023-12-05 (×2): 10 mg via ORAL
  Filled 2023-12-05 (×2): qty 1

## 2023-12-05 NOTE — Plan of Care (Signed)
 Pt is alert and oriented x4. Up with 1 assist and cane due to unsteady gait. CIWA every 6 hours. Pt received prn dose of ativan , claritin  and norco. Flagged yellow mews due to elevated HR when in pain due to headache. After pain resolved mews green.  Problem: Education: Goal: Knowledge of General Education information will improve Description: Including pain rating scale, medication(s)/side effects and non-pharmacologic comfort measures Outcome: Progressing   Problem: Health Behavior/Discharge Planning: Goal: Ability to manage health-related needs will improve Outcome: Progressing   Problem: Clinical Measurements: Goal: Ability to maintain clinical measurements within normal limits will improve Outcome: Progressing Goal: Will remain free from infection Outcome: Progressing Goal: Diagnostic test results will improve Outcome: Progressing Goal: Respiratory complications will improve Outcome: Progressing Goal: Cardiovascular complication will be avoided Outcome: Progressing   Problem: Activity: Goal: Risk for activity intolerance will decrease Outcome: Progressing   Problem: Nutrition: Goal: Adequate nutrition will be maintained Outcome: Progressing   Problem: Coping: Goal: Level of anxiety will decrease Outcome: Progressing   Problem: Elimination: Goal: Will not experience complications related to bowel motility Outcome: Progressing Goal: Will not experience complications related to urinary retention Outcome: Progressing   Problem: Pain Managment: Goal: General experience of comfort will improve and/or be controlled Outcome: Progressing   Problem: Safety: Goal: Ability to remain free from injury will improve Outcome: Progressing   Problem: Skin Integrity: Goal: Risk for impaired skin integrity will decrease Outcome: Progressing

## 2023-12-05 NOTE — Progress Notes (Signed)
 PROGRESS NOTE    Gabriel Koch  VHQ:[id number] DOB: 1966-01-10 DOA: 12/03/2023 PCP: Kathyleen Parkins, MD   Brief Narrative:    Gabriel Koch is a 58 y.o. male with medical history significant of alcohol use disorder, history of strokes, COPD, GERD, tobacco abuse.  Patient was admitted at the beginning of the month due to hyponatremia and alcohol use disorder and subarachnoid hemorrhage.  He was discharged on Librium , however he did not continue it.  He presents today due to leg pain.  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  He requires PT/OT evaluation.  No withdrawal symptoms noted and hyponatremia has improved.  Assessment & Plan:   Principal Problem:   Hyponatremia Active Problems:   Alcohol abuse   HTN (hypertension)  Assessment and Plan:   Hyponatremia-improved This is likely secondary to beer Potomania due to poor oral intake and malnutrition. Thiamine , folic acid  Check BMP in a.m. Alcohol abuse with related peripheral neuropathy CIWA protocol Thiamine , folic acid  Anemia panel unremarkable Currently on gabapentin , plan to increase dose today given ongoing pain PT/OT evaluation pending Hypertension Continue antihypertensives History of stroke   Recent subarachnoid hemorrhage secondary to fall in the setting of alcohol intoxication No sequela No longer on Keppra  Has not followed up with neurosurgery Fall precautions and PT/OT evaluation    DVT prophylaxis: SCDs Code Status: DNR Family Communication: None at bedside Disposition Plan:  Status is: Inpatient Remains inpatient appropriate because: Need for IV medications.   Consultants:  None  Procedures:  None  Antimicrobials:  None   Subjective: Patient seen and evaluated today with continued significant burning pain to bilateral lower extremities.  Sodium levels remain improved.  No signs  of withdrawal noted.  Objective: Vitals:   12/05/23 0028 12/05/23 0150 12/05/23 0400 12/05/23 0838  BP: 127/76 (!) 140/72 125/78 (!) 146/80  Pulse: (!) 107 99 99   Resp:  18 19   Temp:  98.1 F (36.7 C) 97.8 F (36.6 C)   TempSrc:  Oral Oral   SpO2:  98% 97%   Weight:      Height:        Intake/Output Summary (Last 24 hours) at 12/05/2023 1210 Last data filed at 12/05/2023 0600 Gross per 24 hour  Intake 480 ml  Output --  Net 480 ml   Filed Weights   12/03/23 1526  Weight: 59.2 kg    Examination:  General exam: Appears calm and comfortable  Respiratory system: Clear to auscultation. Respiratory effort normal. Cardiovascular system: S1 & S2 heard, RRR.  Gastrointestinal system: Abdomen is soft Central nervous system: Alert and awake Extremities: No edema Skin: No significant lesions noted Psychiatry: Flat affect.    Data Reviewed: I have personally reviewed following labs and imaging studies  CBC: Recent Labs  Lab 12/03/23 1545 12/05/23 0310  WBC 3.4* 1.6*  NEUTROABS 2.2  --   HGB 10.1* 7.9*  HCT 27.6* 23.5*  MCV 110.4* 114.1*  PLT 105* 69*   Basic Metabolic Panel: Recent Labs  Lab 12/03/23 1545 12/03/23 2106 12/04/23 0009 12/04/23 0342 12/04/23 0800 12/05/23 0310  NA 119* 124* 129* 133* 132* 133*  K 3.5  --   --  4.0  --  3.6  CL 85*  --   --  104  --  102  CO2 20*  --   --  21*  --  24  GLUCOSE  111*  --   --  87  --  96  BUN <5*  --   --  <5*  --  5*  CREATININE 0.67  --   --  0.63  --  0.70  CALCIUM  8.5*  --   --  8.4*  --  8.2*  MG  --   --   --   --   --  1.4*   GFR: Estimated Creatinine Clearance: 85.3 mL/min (by C-G formula based on SCr of 0.7 mg/dL). Liver Function Tests: Recent Labs  Lab 12/03/23 1545  AST 86*  ALT 34  ALKPHOS 238*  BILITOT 0.5  PROT 7.4  ALBUMIN 3.0*   No results for input(s): "LIPASE", "AMYLASE" in the last 168 hours. No results for input(s): "AMMONIA" in the last 168 hours. Coagulation Profile: No  results for input(s): "INR", "PROTIME" in the last 168 hours. Cardiac Enzymes: No results for input(s): "CKTOTAL", "CKMB", "CKMBINDEX", "TROPONINI" in the last 168 hours. BNP (last 3 results) No results for input(s): "PROBNP" in the last 8760 hours. HbA1C: No results for input(s): "HGBA1C" in the last 72 hours. CBG: No results for input(s): "GLUCAP" in the last 168 hours. Lipid Profile: No results for input(s): "CHOL", "HDL", "LDLCALC", "TRIG", "CHOLHDL", "LDLDIRECT" in the last 72 hours. Thyroid  Function Tests: No results for input(s): "TSH", "T4TOTAL", "FREET4", "T3FREE", "THYROIDAB" in the last 72 hours. Anemia Panel: Recent Labs    12/04/23 0800  VITAMINB12 366  FOLATE 21.0  FERRITIN 35  TIBC 366  IRON 53  RETICCTPCT 2.1   Sepsis Labs: No results for input(s): "PROCALCITON", "LATICACIDVEN" in the last 168 hours.  No results found for this or any previous visit (from the past 240 hours).       Radiology Studies: No results found.      Scheduled Meds:  amLODipine   5 mg Oral Daily   atorvastatin   40 mg Oral Daily   buPROPion   150 mg Oral BID   busPIRone   10 mg Oral BID   folic acid   1 mg Intravenous Daily   gabapentin   200 mg Oral TID   LORazepam   0-4 mg Oral Q6H   Followed by   LORazepam   0-4 mg Oral Q12H   losartan   50 mg Oral Daily   multivitamin with minerals  1 tablet Oral Daily   naproxen   250 mg Oral TID WC   nicotine   21 mg Transdermal Daily   pantoprazole   40 mg Oral Daily   thiamine  (VITAMIN B1) injection  100 mg Intravenous Daily   traZODone   100 mg Oral QHS     LOS: 2 days    Time spent: 55 minutes    Airlie Blumenberg Loran Rock, DO [facility]  If 7PM-7AM, please contact night-coverage [email or web address] 12/05/2023, 12:10 PM

## 2023-12-05 NOTE — Evaluation (Signed)
 Physical Therapy Evaluation Patient Details Name: Gabriel Koch MRN: [id number] DOB: Apr 01, 1966 Today's Date: 12/05/2023  History of Present Illness  Pt is a 58 y.o. M who presents 11/02/2023 with complaints of falls. Imaging concerning for focal acute SAH in left sylvian fissure without midline shift or edema. Also notable for left L1 and L2 transverse process fractures, possible L3 transverse process fracture; recommendation for outpatient neurosurgery follow up. Significant PMH: alcohol abuse, anxiety, depression, stroke, COPD, HTN.  Clinical Impression  Pt states that he has increase pain in his toes when walking.         If plan is discharge home, recommend the following: A little help with bathing/dressing/bathroom;Assistance with cooking/housework;Help with stairs or ramp for entrance   Can travel by private vehicle    yes    Equipment Recommendations None recommended by PTTherapist recommends pt use his RW at home ; recommend [facility]  Recommendations for Other Services    none   Functional Status Assessment Patient has had a recent decline in their functional status and demonstrates the ability to make significant improvements in function in a reasonable and predictable amount of time. Mod I     Precautions / Restrictions Precautions Precautions: Fall Restrictions Weight Bearing Restrictions Per Provider Order: No      Mobility  Bed Mobility Overal bed mobility: Modified Independent                  Transfers Overall transfer level: Modified independent Equipment used: Straight cane                    Ambulation/Gait Ambulation/Gait assistance: Supervision Gait Distance (Feet): 30 Feet Assistive device: Straight cane Gait Pattern/deviations: Decreased step length - right, Decreased step length - left Gait velocity: slow              Pertinent Vitals/Pain Pain Assessment Pain Assessment: 0-10 Pain Score: 8  Pain Location: toes Pain  Descriptors / Indicators: Aching Pain Intervention(s): Limited activity within patient's tolerance    Home Living Family/patient expects to be discharged to:: Private residence Living Arrangements: Alone Available Help at Discharge: Family;Available PRN/intermittently Type of Home: House Home Access: Stairs to enter   [employer] of Steps: 2   Home Layout: One level Home Equipment: Cane - single Librarian, academic (2 wheels)      Prior Function               Mobility Comments: using cane intermittently, reports hx of falls ADLs Comments: Does not have a driver's license. independent ADL's, goes over to aunt's house for laundry, uses Medicaid transport. Cousin will assist him with transportation occasionally and get groceries.     Extremity/Trunk Assessment        Lower Extremity Assessment Lower Extremity Assessment: Generalized weakness       Communication   Communication Communication: No apparent difficulties    Cognition Arousal: Alert Behavior During Therapy: Agitated   PT - Cognitive impairments: No apparent impairments                         Following commands: Intact       Cueing Cueing Techniques: Verbal cues     General Comments      Exercises General Exercises - Lower Extremity Ankle Circles/Pumps: Both, 10 reps Quad Sets: Both, 10 reps Heel Slides: Both, 10 reps   Assessment/Plan    PT Assessment Patient needs continued PT services  PT Problem List Decreased  strength;Decreased balance       PT Treatment Interventions Gait training;Therapeutic activities;Therapeutic exercise    PT Goals (Current goals can be found in the Care Plan section)  Acute Rehab PT Goals Patient Stated Goal: for his feet and toes to be better PT Goal Formulation: With patient Time For Goal Achievement: 12/08/23 Potential to Achieve Goals: Good    Frequency Min 2X/week     Co-evaluation               AM-PAC PT "6  Clicks" Mobility  Outcome Measure Help needed turning from your back to your side while in a flat bed without using bedrails?: None Help needed moving from lying on your back to sitting on the side of a flat bed without using bedrails?: None Help needed moving to and from a bed to a chair (including a wheelchair)?: A Little Help needed standing up from a chair using your arms (e.g., wheelchair or bedside chair)?: A Little Help needed to walk in hospital room?: A Little Help needed climbing 3-5 steps with a railing? : A Little 6 Click Score: 20    End of Session Equipment Utilized During Treatment: Gait belt Activity Tolerance: Patient tolerated treatment well Patient left: in bed;with call bell/phone within reach;with bed alarm set Nurse Communication: Mobility status PT Visit Diagnosis: Unsteadiness on feet (R26.81);Muscle weakness (generalized) (M62.81)    Time: 4098-1191 PT Time Calculation (min) (ACUTE ONLY): 32 min   Charges:   PT Evaluation $PT Eval Low Complexity: 1 Low PT Treatments $Therapeutic Exercise: 8-22 mins PT General Charges $$ ACUTE PT VISIT: 1 Visit         Leodis Rainwater, PT CLT [phone]  12/05/2023, 10:51 AM

## 2023-12-05 NOTE — Progress Notes (Signed)
   12/05/23 1259  TOC Brief Assessment  Insurance and Status Reviewed  Patient has primary care physician Yes (FUSCO, LAWRENCE)  Home environment has been reviewed Pt lives in a camper on relatives property  Prior level of function: Independent  Prior/Current Home Services No current home services  Social Drivers of Health Review SDOH reviewed no interventions necessary  Readmission risk has been reviewed Yes  Transition of care needs no transition of care needs at this time   CSW met with pt to offer HHPT recommendation, he declined. Pt resides in a small camper on family property.  He "barely has room for his walker".  CSW asked pt if he could meet the PT provider in the home on the property. Pt states no, he does not want to ask his cousin/aunt. He pays water  and light and does not ask for anything.  CSW asked can he look into OPT and if he can get a ride- he said that he can get rides.  CSW advised PCP can assist if wants OPT later.   CSW then discussed the consult for substance use- pt declined resource/education saying he does not use drugs or marijuana.  CSW reminded pt alcohol is a substance as he said he only drinks.  Pt states he has resources about that and does not want more. No other TOC needs at this time.

## 2023-12-06 DIAGNOSIS — E871 Hypo-osmolality and hyponatremia: Secondary | ICD-10-CM | POA: Diagnosis not present

## 2023-12-06 LAB — CBC
HCT: 24.4 % — ABNORMAL LOW (ref 39.0–52.0)
Hemoglobin: 8.1 g/dL — ABNORMAL LOW (ref 13.0–17.0)
MCH: 38.2 pg — ABNORMAL HIGH (ref 26.0–34.0)
MCHC: 33.2 g/dL (ref 30.0–36.0)
MCV: 115.1 fL — ABNORMAL HIGH (ref 80.0–100.0)
Platelets: 69 10*3/uL — ABNORMAL LOW (ref 150–400)
RBC: 2.12 MIL/uL — ABNORMAL LOW (ref 4.22–5.81)
RDW: 14.6 % (ref 11.5–15.5)
WBC: 2.1 10*3/uL — ABNORMAL LOW (ref 4.0–10.5)
nRBC: 0 % (ref 0.0–0.2)

## 2023-12-06 LAB — BASIC METABOLIC PANEL WITH GFR
Anion gap: 8 (ref 5–15)
BUN: 6 mg/dL (ref 6–20)
CO2: 23 mmol/L (ref 22–32)
Calcium: 8.4 mg/dL — ABNORMAL LOW (ref 8.9–10.3)
Chloride: 104 mmol/L (ref 98–111)
Creatinine, Ser: 0.66 mg/dL (ref 0.61–1.24)
GFR, Estimated: >60 mL/min (ref >60)
Glucose, Bld: 88 mg/dL (ref 70–99)
Potassium: 3.7 mmol/L (ref 3.5–5.1)
Sodium: 135 mmol/L (ref 135–145)

## 2023-12-06 LAB — MAGNESIUM: Magnesium: 1.7 mg/dL (ref 1.7–2.4)

## 2023-12-06 MED ORDER — FOLIC ACID 1 MG PO TABS
1.0000 mg | ORAL_TABLET | Freq: Every day | ORAL | Status: DC
  Administered 2023-12-06: 1 mg via ORAL
  Filled 2023-12-06: qty 1

## 2023-12-06 MED ORDER — ALBUTEROL SULFATE HFA 108 (90 BASE) MCG/ACT IN AERS
1.0000 | INHALATION_SPRAY | RESPIRATORY_TRACT | Status: DC | PRN
Start: 2023-12-06 — End: 2023-12-06

## 2023-12-06 MED ORDER — GABAPENTIN 300 MG PO CAPS: 300.0000 mg | ORAL_CAPSULE | Freq: Three times a day (TID) | ORAL | 0 refills | Status: DC

## 2023-12-06 NOTE — TOC Transition Note (Signed)
 Transition of Care [facility]) - Discharge Note   Patient Details  Name: Gabriel Koch MRN: [id number] Date of Birth: January 05, 1966  [facility] of [facility] CM/SW Contact:  Ander Katos, LCSW Phone Number: 12/06/2023, 10:22 AM   Clinical Narrative:  Pt now agreeable to PT after d/c. Due to insurance, discussed OOPT instead of HHPT and pt agreeable to [facility]. Referral sent.       Final next level of care: OP Rehab Barriers to Discharge: Barriers Resolved   Patient Goals and CMS Choice     Choice offered to / list presented to : Patient [place] ownership interest in [facility].provided to::  (n/a)    Discharge Placement                       Discharge Plan and Services Additional resources added to the After Visit Summary for                                       Social Drivers of Health (SDOH) Interventions SDOH Screenings   Food Insecurity: No Food Insecurity (12/03/2023)  Housing: Low Risk  (12/03/2023)  Transportation Needs: No Transportation Needs (12/03/2023)  Utilities: Not At Risk (12/03/2023)  Tobacco Use: High Risk (12/03/2023)     Readmission Risk Interventions     No data to display

## 2023-12-06 NOTE — Discharge Summary (Signed)
 Physician Discharge Summary  REMO KIRSCHENMANN AVW:[id number] DOB: 07-13-1965 DOA: 12/03/2023  PCP: Kathyleen Parkins, MD  Admit date: 12/03/2023  Discharge date: 12/06/2023  Admitted From:Home  Disposition:  Home  Recommendations for Outpatient Follow-up:  Follow up with PCP in 1-2 weeks and follow-up BMP in 1-2 weeks Counseled on alcohol cessation, does not want any other resources on this Did not have any withdrawal symptoms during hospitalization Remain on gabapentin  300 mg 3 times daily as prescribed for peripheral neuropathy Recommended follow-up with neurosurgery given recent subarachnoid hemorrhage and patient has information on this from prior hospitalization  Home Health: Yes with PT/OT  Equipment/Devices: None  Discharge Condition:Stable  CODE STATUS: DNR  Diet recommendation: Heart Healthy  Brief/Interim Summary: Gabriel Koch is a 58 y.o. male with medical history significant of alcohol use disorder, history of strokes, COPD, GERD, tobacco abuse.  Patient was admitted at the beginning of the month due to hyponatremia and alcohol use disorder and subarachnoid hemorrhage.  He was discharged on Librium , however he did not continue it.  He presents today due to leg pain.  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  He underwent PT/OT evaluation with recommendations for home health services which have not been arranged.  He has been counseled on alcohol cessation and will need close follow-up as noted above for repeat electrolyte levels.  He is noted to have peripheral neuropathy for which gabapentin  dosing has been increased with improvement in some of his symptoms.  No other acute events or concerns noted.  Discharge Diagnoses:  Principal Problem:   Hyponatremia Active Problems:   Alcohol abuse   HTN (hypertension)  Principal discharge diagnosis: Hyponatremia in the  setting of alcohol abuse with related peripheral neuropathy.  Discharge Instructions  Discharge Instructions     Diet - low sodium heart healthy   Complete by: As directed    Increase activity slowly   Complete by: As directed       Allergies as of 12/06/2023       Reactions   Bee Venom Anaphylaxis        Medication List     STOP taking these medications    levETIRAcetam  500 MG 24 hr tablet Commonly known as: KEPPRA  XR       TAKE these medications    acetaminophen  500 MG tablet Commonly known as: TYLENOL  Take 1,000 mg by mouth every 6 (six) hours as needed for mild pain (pain score 1-3) or moderate pain (pain score 4-6).   acetaminophen  325 MG tablet Commonly known as: TYLENOL  Take 2 tablets (650 mg total) by mouth every 6 (six) hours as needed for mild pain (pain score 1-3) (or Fever >/= 101).   albuterol  108 (90 Base) MCG/ACT inhaler Commonly known as: VENTOLIN  HFA Inhale 1 puff into the lungs every 4 (four) hours as needed for wheezing or shortness of breath.   amLODipine  5 MG tablet Commonly known as: NORVASC  Take 5 mg by mouth daily.   atorvastatin  40 MG tablet Commonly known as: LIPITOR Take 40 mg by mouth daily.   buPROPion  75 MG tablet Commonly known as: WELLBUTRIN  Take 2 tablets (150 mg total) by mouth 2 (two) times daily.   busPIRone  10 MG tablet Commonly known as: BUSPAR  Take 1 tablet (10 mg total) by mouth 2 (two) times daily.   CertaVite/Antioxidants Tabs Take 1 tablet by mouth daily.   diclofenac  Sodium 1 % Gel Commonly known as: VOLTAREN Apply 2 g topically 4 (four) times daily.   esomeprazole  40 MG capsule Commonly known as: NEXIUM  Take 1 capsule (40 mg total) by mouth 2 (two) times daily before a meal.   folic acid  1 MG tablet Commonly known as: FOLVITE  Take 1 tablet (1 mg total) by mouth daily.   furosemide  20 MG tablet Commonly known as: LASIX  Take 20 mg by mouth daily.   gabapentin  300 MG capsule Commonly known as:  NEURONTIN  Take 1 capsule (300 mg total) by mouth 3 (three) times daily. What changed:  medication strength how much to take   HYDROcodone -acetaminophen  5-325 MG tablet Commonly known as: NORCO/VICODIN Take 1 tablet by mouth every 6 (six) hours as needed for moderate pain (pain score 4-6) or severe pain (pain score 7-10).   lidocaine  5 % Commonly known as: Lidoderm  Place 1 patch onto the skin daily. Remove & Discard patch within 12 hours or as directed by MD   losartan  50 MG tablet Commonly known as: COZAAR  Take 50 mg by mouth daily.   meloxicam 7.5 MG tablet Commonly known as: MOBIC Take 7.5 mg by mouth daily.   nicotine  14 mg/24hr patch Commonly known as: NICODERM CQ  - dosed in mg/24 hours Place 1 patch (14 mg total) onto the skin daily.   polyethylene glycol powder 17 GM/SCOOP powder Commonly known as: GLYCOLAX /MIRALAX  Take 1 capful (17 g) by mouth daily as needed for mild constipation.   thiamine  100 MG tablet Commonly known as: VITAMIN B1 Take 1 tablet (100 mg total) by mouth daily.   traZODone  100 MG tablet Commonly known as: DESYREL  Take 1 tablet (100 mg total) by mouth at bedtime.   zolpidem 10 MG tablet Commonly known as: AMBIEN Take 10 mg by mouth at bedtime.        Follow-up Information     Kathyleen Parkins, MD. Schedule an appointment as soon as possible for a visit in 1 week(s).   Specialty: Internal Medicine Contact information: [address] [place] [address] [phone]                Allergies  Allergen Reactions   Bee Venom Anaphylaxis    Consultations: None   Procedures/Studies: US  Abdomen Complete Result Date: 11/24/2023 CLINICAL DATA:  Rule out cirrhosis EXAM: ABDOMEN ULTRASOUND COMPLETE COMPARISON:  CT chest abdomen and pelvis 11/02/2023. FINDINGS: Gallbladder: No gallstones or wall thickening visualized. Positive sonographic Murphy sign noted by sonographer. Common bile duct: Diameter: 5 mm Liver: Subcentimeter  echogenic left hepatic lobe lesion, likely a small hemangioma. Increased parenchymal echogenicity. Smooth hepatic contour. Portal vein is patent on color Doppler imaging with normal direction of blood flow towards the liver. IVC: No abnormality visualized. Pancreas: Visualized portion unremarkable. Spleen: Size and appearance within normal limits. Right Kidney: Length: 10.8 cm. Echogenicity within normal limits. No mass or hydronephrosis visualized. Left Kidney: Length: 12.0 cm. Echogenicity within normal limits. No mass or hydronephrosis visualized. Abdominal aorta: No aneurysm visualized. Other findings: None. IMPRESSION: 1. Mild hepatic steatosis.  No sonographic evidence of cirrhosis. 2. Subcentimeter left hepatic lobe hemangioma. 3. Reportedly positive sonographic Murphy's sign. No cholelithiasis, biliary ductal dilation or evidence of acute cholecystitis. Electronically Signed   By: Rox Cope M.D.   On: 11/24/2023 10:04     Discharge Exam: Vitals:   12/05/23 2357 12/06/23 0604  BP: (!) 147/71 138/81  Pulse: 96 81  Resp: 20 20  Temp: 97.9 F (36.6 C) 98.1 F (36.7 C)  SpO2: 99% 99%   Vitals:   12/05/23 1456 12/05/23 2053 12/05/23 2357 12/06/23 0604  BP: 134/80 (!) 147/82 (!) 147/71 138/81  Pulse: 94 94 96 81  Resp:  20 20 20   Temp: 98.3 F (36.8 C) 98.3 F (36.8 C) 97.9 F (36.6 C) 98.1 F (36.7 C)  TempSrc: Oral Oral Oral Oral  SpO2: 99% 93% 99% 99%  Weight:      Height:        General: Pt is alert, awake, not in acute distress Cardiovascular: RRR, S1/S2 +, no rubs, no gallops Respiratory: CTA bilaterally, no wheezing, no rhonchi Abdominal: Soft, NT, ND, bowel sounds + Extremities: no edema, no cyanosis    The results of significant diagnostics from this hospitalization (including imaging, microbiology, ancillary and laboratory) are listed below for reference.     Microbiology: No results found for this or any previous visit (from the past 240 hours).    Labs: BNP (last 3 results) No results for input(s): "BNP" in the last 8760 hours. Basic Metabolic Panel: Recent Labs  Lab 12/03/23 1545 12/03/23 2106 12/04/23 0009 12/04/23 0342 12/04/23 0800 12/05/23 0310 12/06/23 0330  NA 119*   < > 129* 133* 132* 133* 135  K 3.5  --   --  4.0  --  3.6 3.7  CL 85*  --   --  104  --  102 104  CO2 20*  --   --  21*  --  24 23  GLUCOSE 111*  --   --  87  --  96 88  BUN <5*  --   --  <5*  --  5* 6  CREATININE 0.67  --   --  0.63  --  0.70 0.66  CALCIUM  8.5*  --   --  8.4*  --  8.2* 8.4*  MG  --   --   --   --   --  1.4* 1.7   < > = values in this interval not displayed.   Liver Function Tests: Recent Labs  Lab 12/03/23 1545  AST 86*  ALT 34  ALKPHOS 238*  BILITOT 0.5  PROT 7.4  ALBUMIN 3.0*   No results for input(s): "LIPASE", "AMYLASE" in the last 168 hours. No results for input(s): "AMMONIA" in the last 168 hours. CBC: Recent Labs  Lab 12/03/23 1545 12/05/23 0310 12/06/23 0330  WBC 3.4* 1.6* 2.1*  NEUTROABS 2.2  --   --   HGB 10.1* 7.9* 8.1*  HCT 27.6* 23.5* 24.4*  MCV 110.4* 114.1* 115.1*  PLT 105* 69* 69*   Cardiac Enzymes: No results for input(s): "CKTOTAL", "CKMB", "CKMBINDEX", "TROPONINI" in the last 168 hours. BNP: Invalid input(s): "POCBNP" CBG: No results for input(s): "GLUCAP" in the last 168 hours. D-Dimer No results for input(s): "DDIMER" in the last 72 hours. Hgb A1c No results for input(s): "HGBA1C" in the last 72 hours. Lipid Profile No results for input(s): "CHOL", "HDL", "LDLCALC", "TRIG", "CHOLHDL", "LDLDIRECT" in the last 72 hours. Thyroid  function studies No results for input(s): "TSH", "T4TOTAL", "T3FREE", "THYROIDAB" in the last 72 hours.  Invalid input(s): "FREET3" Anemia work up Recent Labs    12/04/23 0800  VITAMINB12 366  FOLATE 21.0  FERRITIN 35  TIBC 366  IRON 53  RETICCTPCT 2.1   Urinalysis    Component Value Date/Time   COLORURINE STRAW (A) 12/03/2023 1720   APPEARANCEUR  CLEAR 12/03/2023 1720   APPEARANCEUR Clear 03/09/2013 2142   LABSPEC 1.003 (L) 12/03/2023 1720   LABSPEC  1.002 03/09/2013 2142   PHURINE 5.0 12/03/2023 1720   GLUCOSEU NEGATIVE 12/03/2023 1720   GLUCOSEU Negative 03/09/2013 2142   HGBUR SMALL (A) 12/03/2023 1720   BILIRUBINUR NEGATIVE 12/03/2023 1720   BILIRUBINUR Negative 03/09/2013 2142   KETONESUR NEGATIVE 12/03/2023 1720   PROTEINUR NEGATIVE 12/03/2023 1720   NITRITE NEGATIVE 12/03/2023 1720   LEUKOCYTESUR NEGATIVE 12/03/2023 1720   LEUKOCYTESUR Negative 03/09/2013 2142   Sepsis Labs Recent Labs  Lab 12/03/23 1545 12/05/23 0310 12/06/23 0330  WBC 3.4* 1.6* 2.1*   Microbiology No results found for this or any previous visit (from the past 240 hours).   Time coordinating discharge: 35 minutes  SIGNED:   Cornelius Dill, DO [facility] 12/06/2023, 9:57 AM  If 7PM-7AM, please contact night-coverage [email or web address]

## 2023-12-06 NOTE — Plan of Care (Signed)

## 2023-12-06 NOTE — Progress Notes (Signed)
Patient discharged home with instructions given on medications,and follow up visits,patient verbalized understanding. Prescriptions sent to Pharmacy of choice documented on AVS. IV discontinued, catheter intact. Accompanied by staff to an awaiting vehicle.

## 2024-01-13 ENCOUNTER — Other Ambulatory Visit (HOSPITAL_COMMUNITY): Payer: Self-pay

## 2024-02-02 ENCOUNTER — Other Ambulatory Visit: Payer: Self-pay

## 2024-02-02 ENCOUNTER — Emergency Department (HOSPITAL_COMMUNITY): Payer: MEDICAID

## 2024-02-02 ENCOUNTER — Inpatient Hospital Stay (HOSPITAL_COMMUNITY)
Admission: EM | Admit: 2024-02-02 | Discharge: 2024-02-04 | DRG: 641 | Disposition: A | Discharge status: Home / Self Care | Payer: MEDICAID | Source: Other Acute Inpatient Hospital | Attending: Family Medicine | Admitting: Family Medicine

## 2024-02-02 ENCOUNTER — Encounter (HOSPITAL_COMMUNITY): Payer: Self-pay | Admitting: Family Medicine

## 2024-02-02 DIAGNOSIS — Z8673 Personal history of transient ischemic attack (TIA), and cerebral infarction without residual deficits: Secondary | ICD-10-CM

## 2024-02-02 DIAGNOSIS — F419 Anxiety disorder, unspecified: Secondary | ICD-10-CM | POA: Diagnosis present

## 2024-02-02 DIAGNOSIS — S0990XA Unspecified injury of head, initial encounter: Secondary | ICD-10-CM | POA: Diagnosis present

## 2024-02-02 DIAGNOSIS — Y906 Blood alcohol level of 120-199 mg/100 ml: Secondary | ICD-10-CM | POA: Diagnosis present

## 2024-02-02 DIAGNOSIS — Z716 Tobacco abuse counseling: Secondary | ICD-10-CM

## 2024-02-02 DIAGNOSIS — E871 Hypo-osmolality and hyponatremia: Secondary | ICD-10-CM | POA: Diagnosis not present

## 2024-02-02 DIAGNOSIS — F17290 Nicotine dependence, other tobacco product, uncomplicated: Secondary | ICD-10-CM | POA: Diagnosis present

## 2024-02-02 DIAGNOSIS — W19XXXA Unspecified fall, initial encounter: Secondary | ICD-10-CM | POA: Diagnosis present

## 2024-02-02 DIAGNOSIS — D61818 Other pancytopenia: Secondary | ICD-10-CM | POA: Diagnosis present

## 2024-02-02 DIAGNOSIS — J4489 Other specified chronic obstructive pulmonary disease: Secondary | ICD-10-CM | POA: Diagnosis present

## 2024-02-02 DIAGNOSIS — E860 Dehydration: Secondary | ICD-10-CM | POA: Diagnosis present

## 2024-02-02 DIAGNOSIS — F10129 Alcohol abuse with intoxication, unspecified: Secondary | ICD-10-CM | POA: Diagnosis present

## 2024-02-02 DIAGNOSIS — Z91030 Bee allergy status: Secondary | ICD-10-CM

## 2024-02-02 DIAGNOSIS — K625 Hemorrhage of anus and rectum: Secondary | ICD-10-CM | POA: Diagnosis present

## 2024-02-02 DIAGNOSIS — K219 Gastro-esophageal reflux disease without esophagitis: Secondary | ICD-10-CM | POA: Diagnosis present

## 2024-02-02 DIAGNOSIS — I10 Essential (primary) hypertension: Secondary | ICD-10-CM | POA: Diagnosis present

## 2024-02-02 DIAGNOSIS — F32A Depression, unspecified: Secondary | ICD-10-CM | POA: Diagnosis present

## 2024-02-02 DIAGNOSIS — F17210 Nicotine dependence, cigarettes, uncomplicated: Secondary | ICD-10-CM | POA: Diagnosis present

## 2024-02-02 DIAGNOSIS — F4310 Post-traumatic stress disorder, unspecified: Secondary | ICD-10-CM | POA: Diagnosis present

## 2024-02-02 DIAGNOSIS — Z860100 Personal history of colon polyps, unspecified: Secondary | ICD-10-CM

## 2024-02-02 DIAGNOSIS — Z79899 Other long term (current) drug therapy: Secondary | ICD-10-CM

## 2024-02-02 LAB — CBC
HCT: 30.9 % — ABNORMAL LOW (ref 39.0–52.0)
Hemoglobin: 11.3 g/dL — ABNORMAL LOW (ref 13.0–17.0)
MCH: 38.3 pg — ABNORMAL HIGH (ref 26.0–34.0)
MCHC: 36.6 g/dL — ABNORMAL HIGH (ref 30.0–36.0)
MCV: 104.7 fL — ABNORMAL HIGH (ref 80.0–100.0)
Platelets: 116 K/uL — ABNORMAL LOW (ref 150–400)
RBC: 2.95 MIL/uL — ABNORMAL LOW (ref 4.22–5.81)
RDW: 14.4 % (ref 11.5–15.5)
WBC: 3.4 K/uL — ABNORMAL LOW (ref 4.0–10.5)
nRBC: 0 % (ref 0.0–0.2)

## 2024-02-02 LAB — COMPREHENSIVE METABOLIC PANEL WITH GFR
ALT: 28 U/L (ref 0–44)
AST: 55 U/L — ABNORMAL HIGH (ref 15–41)
Albumin: 4.3 g/dL (ref 3.5–5.0)
Alkaline Phosphatase: 170 U/L — ABNORMAL HIGH (ref 38–126)
Anion gap: 18 — ABNORMAL HIGH (ref 5–15)
BUN: 17 mg/dL (ref 6–20)
CO2: 16 mmol/L — ABNORMAL LOW (ref 22–32)
Calcium: 9.6 mg/dL (ref 8.9–10.3)
Chloride: 86 mmol/L — ABNORMAL LOW (ref 98–111)
Creatinine, Ser: 1.52 mg/dL — ABNORMAL HIGH (ref 0.61–1.24)
GFR, Estimated: 53 mL/min — ABNORMAL LOW (ref >60)
Glucose, Bld: 77 mg/dL (ref 70–99)
Potassium: 3.3 mmol/L — ABNORMAL LOW (ref 3.5–5.1)
Sodium: 120 mmol/L — ABNORMAL LOW (ref 135–145)
Total Bilirubin: 1.3 mg/dL — ABNORMAL HIGH (ref 0.0–1.2)
Total Protein: 8.9 g/dL — ABNORMAL HIGH (ref 6.5–8.1)

## 2024-02-02 LAB — SODIUM, URINE, RANDOM: Sodium, Ur: <10 mmol/L

## 2024-02-02 LAB — TYPE AND SCREEN
ABO/RH(D): O NEG
Antibody Screen: NEGATIVE

## 2024-02-02 LAB — ETHANOL: Alcohol, Ethyl (B): 140 mg/dL — ABNORMAL HIGH (ref <15)

## 2024-02-02 LAB — PROTIME-INR
INR: 1 (ref 0.8–1.2)
Prothrombin Time: 13.3 s (ref 11.4–15.2)

## 2024-02-02 LAB — SODIUM: Sodium: 124 mmol/L — ABNORMAL LOW (ref 135–145)

## 2024-02-02 LAB — OSMOLALITY, URINE: Osmolality, Ur: 185 mosm/kg — ABNORMAL LOW (ref 300–900)

## 2024-02-02 LAB — POC OCCULT BLOOD, ED

## 2024-02-02 MED ORDER — BISACODYL 10 MG RE SUPP: 10.0000 mg | Freq: Every day | RECTAL | Status: DC | PRN

## 2024-02-02 MED ORDER — SODIUM CHLORIDE 0.9 % IV SOLN
INTRAVENOUS | Status: AC | PRN
Start: 2024-02-02 — End: 2024-02-03

## 2024-02-02 MED ORDER — ACETAMINOPHEN 650 MG RE SUPP: 650.0000 mg | Freq: Four times a day (QID) | RECTAL | Status: DC | PRN

## 2024-02-02 MED ORDER — ACETAMINOPHEN 325 MG PO TABS
650.0000 mg | ORAL_TABLET | Freq: Four times a day (QID) | ORAL | Status: DC | PRN
Start: 2024-02-02 — End: 2024-02-04
  Administered 2024-02-02 – 2024-02-04 (×7): 650 mg via ORAL
  Filled 2024-02-02 (×6): qty 2

## 2024-02-02 MED ORDER — SODIUM CHLORIDE 0.9 % IV BOLUS
1000.0000 mL | Freq: Once | INTRAVENOUS | Status: AC
  Administered 2024-02-02: 1000 mL via INTRAVENOUS

## 2024-02-02 MED ORDER — LORAZEPAM 2 MG/ML IJ SOLN: 0.0000 mg | Freq: Three times a day (TID) | INTRAMUSCULAR | Status: DC

## 2024-02-02 MED ORDER — LORAZEPAM 1 MG PO TABS
1.0000 mg | ORAL_TABLET | ORAL | Status: DC | PRN
  Administered 2024-02-04: 1 mg via ORAL
  Filled 2024-02-02: qty 1

## 2024-02-02 MED ORDER — SODIUM CHLORIDE 0.9 % IV SOLN: INTRAVENOUS | Status: DC

## 2024-02-02 MED ORDER — THIAMINE MONONITRATE 100 MG PO TABS
100.0000 mg | ORAL_TABLET | Freq: Every day | ORAL | Status: DC
  Administered 2024-02-02 – 2024-02-04 (×3): 100 mg via ORAL
  Filled 2024-02-02 (×3): qty 1

## 2024-02-02 MED ORDER — CHLORHEXIDINE GLUCONATE CLOTH 2 % EX PADS
6.0000 | MEDICATED_PAD | Freq: Every day | CUTANEOUS | Status: DC
  Administered 2024-02-02: 6 via TOPICAL

## 2024-02-02 MED ORDER — FOLIC ACID 1 MG PO TABS
1.0000 mg | ORAL_TABLET | Freq: Every day | ORAL | Status: DC
  Administered 2024-02-02 – 2024-02-04 (×3): 1 mg via ORAL
  Filled 2024-02-02 (×3): qty 1

## 2024-02-02 MED ORDER — ADULT MULTIVITAMIN W/MINERALS CH
1.0000 | ORAL_TABLET | Freq: Every day | ORAL | Status: DC
  Administered 2024-02-02 – 2024-02-04 (×3): 1 via ORAL
  Filled 2024-02-02 (×3): qty 1

## 2024-02-02 MED ORDER — SODIUM CHLORIDE 0.9% FLUSH
3.0000 mL | Freq: Two times a day (BID) | INTRAVENOUS | Status: DC
  Administered 2024-02-03: 3 mL via INTRAVENOUS

## 2024-02-02 MED ORDER — SODIUM CHLORIDE 0.9% FLUSH: 3.0000 mL | INTRAVENOUS | Status: DC | PRN

## 2024-02-02 MED ORDER — ALBUTEROL SULFATE (2.5 MG/3ML) 0.083% IN NEBU: 2.5000 mg | INHALATION_SOLUTION | RESPIRATORY_TRACT | Status: DC | PRN

## 2024-02-02 MED ORDER — LORAZEPAM 2 MG/ML IJ SOLN
1.0000 mg | INTRAMUSCULAR | Status: DC | PRN
  Administered 2024-02-04: 1 mg via INTRAVENOUS
  Filled 2024-02-02 (×2): qty 1

## 2024-02-02 MED ORDER — SODIUM CHLORIDE 0.9 % IV BOLUS
500.0000 mL | Freq: Once | INTRAVENOUS | Status: AC
  Administered 2024-02-02: 500 mL via INTRAVENOUS

## 2024-02-02 MED ORDER — POLYETHYLENE GLYCOL 3350 17 G PO PACK: 17.0000 g | PACK | Freq: Every day | ORAL | Status: DC | PRN

## 2024-02-02 MED ORDER — NICOTINE 21 MG/24HR TD PT24
21.0000 mg | MEDICATED_PATCH | Freq: Every day | TRANSDERMAL | Status: DC
  Administered 2024-02-02 – 2024-02-04 (×3): 21 mg via TRANSDERMAL
  Filled 2024-02-02 (×3): qty 1

## 2024-02-02 MED ORDER — LORAZEPAM 2 MG/ML IJ SOLN
0.0000 mg | INTRAMUSCULAR | Status: DC
  Administered 2024-02-02 – 2024-02-03 (×2): 2 mg via INTRAVENOUS
  Filled 2024-02-02: qty 1

## 2024-02-02 MED ORDER — SODIUM CHLORIDE 0.9% FLUSH
3.0000 mL | Freq: Two times a day (BID) | INTRAVENOUS | Status: DC
  Administered 2024-02-02 – 2024-02-04 (×4): 3 mL via INTRAVENOUS

## 2024-02-02 MED ORDER — THIAMINE HCL 100 MG/ML IJ SOLN: 100.0000 mg | Freq: Every day | INTRAMUSCULAR | Status: DC

## 2024-02-02 MED ORDER — PANTOPRAZOLE SODIUM 40 MG PO TBEC
40.0000 mg | DELAYED_RELEASE_TABLET | Freq: Two times a day (BID) | ORAL | Status: DC
  Administered 2024-02-02 – 2024-02-04 (×4): 40 mg via ORAL
  Filled 2024-02-02 (×4): qty 1

## 2024-02-02 NOTE — ED Triage Notes (Addendum)
 Pt arrived via [facility]. EMS called to [facility] for a transport for a closed head injury. EMS stated that he hit his head on Friday. Pt has been having rectal bleeding and back pain for one week. CBG 80. Pt states ETOH is not cause of falls. Pt states he had his last drink 0700.

## 2024-02-02 NOTE — H&P (Signed)
 History and Physical    Patient: Gabriel Koch FMW:[id number] DOB: 1965/11/08 DOA: 02/02/2024 DOS: the patient was seen and examined on 02/02/2024 PCP: Bertell Satterfield, MD  Patient coming from: Home  Chief Complaint:  Chief Complaint  Patient presents with   Head Injury   Rectal Bleeding   HPI: Gabriel Koch is a 58 y.o. male with medical history significant for  alcohol use disorder, history of strokes, COPD, GERD, tobacco abuse, history of subarachnoid hemorrhage and left sylvian Fissure back in April 2025 due to falls while intoxicated ,  , recurrent admissions for [facility] potomania/hyponatremia presents to the ED today again after fall with closed head injury in the setting of ongoing alcohol abuse/acute alcohol intoxication. - CBG 80 - Patient denies significant headaches, no visual disturbance no new focal deficits No fever  Or chills   No Nausea, Vomiting or Diarrhea - In ED CT head without new acute findings Chest x-ray without acute findings -Blood alcohol level is 140 - CBC consistent with pancytopenia with WBC of 3.4 hemoglobin 11.3 which is higher than prior baseline platelets 116 - Creatinine 1.52 baseline 0.6-0.7, AST greater than ALT , elevated alk phos and T. bili noted - Sodium 120 was 135 on 12/06/2023 potassium is down to 3.3 chloride 86 bicarb is 16  Review of Systems: As mentioned in the history of present illness. All other systems reviewed and are negative. Past Medical History:  Diagnosis Date   Alcohol abuse    Anxiety and depression    Asthma    Blood transfusion    Complication of anesthesia    patient hemorrrhaged about 2 weeks after tonsillecotmy   COPD (chronic obstructive pulmonary disease) (HCC)    GERD (gastroesophageal reflux disease)    Hypertension    PTSD (post-traumatic stress disorder)    Stroke (HCC) 2021   Past Surgical History:  Procedure Laterality Date   APPENDECTOMY     BIOPSY  04/02/2020   Procedure: BIOPSY;  Surgeon:  Cindie Carlin POUR, DO;  [facility];  Service: Endoscopy;;   BIOPSY  03/18/2022   Procedure: BIOPSY;  Surgeon: Cindie Carlin POUR, DO;  [facility];  Service: Endoscopy;;   BIOPSY  03/22/2023   Procedure: BIOPSY;  Surgeon: Cindie Carlin POUR, DO;  [facility];  Service: Endoscopy;;   COLONOSCOPY WITH PROPOFOL  N/A 04/02/2020   Procedure: COLONOSCOPY WITH PROPOFOL ;  Surgeon: Cindie Carlin POUR, DO;  [facility];  Service: Endoscopy;  Laterality: N/A;  8:30am   COLONOSCOPY WITH PROPOFOL  N/A 03/18/2022   Surgeon: Cindie Carlin POUR, DO; Nonbleeding internal hemorrhoids, multiple diverticula in the sigmoid colon, segmental area of moderately friable mucosa with spontaneous bleeding in the transverse and ascending colon biopsied. Pathology with no specific histopathologic change. Recommended 5 year surveillance.   COLONOSCOPY WITH PROPOFOL  N/A 03/22/2023   Nonbleeding internal hemorrhoids, multiple sigmoid diverticula, normal TI s/p biopsy, localized area of mildly friable mucosa with contact bleeding in ascending colon and cecum.  Biopsies taken from entire colon.  TI biopsy benign.  Mild reactive/reparative change in in [facility] and TC.  Minimal increased intraepithelial lymphocytes and descending colon, nonspecific.  Sigmoid and rectum biopsy benign.   ESOPHAGOGASTRODUODENOSCOPY (EGD) WITH PROPOFOL  N/A 04/02/2020   Procedure: ESOPHAGOGASTRODUODENOSCOPY (EGD) WITH PROPOFOL ;  Surgeon: Cindie Carlin POUR, DO;  [facility];  Service: Endoscopy;  Laterality: N/A;   ESOPHAGOGASTRODUODENOSCOPY (EGD) WITH PROPOFOL  N/A 03/21/2021   Procedure: ESOPHAGOGASTRODUODENOSCOPY (EGD) WITH PROPOFOL ;  Surgeon: Cindie Carlin POUR, DO;  [facility];  Service: Endoscopy;  Laterality: N/A;  11:00am   ESOPHAGOGASTRODUODENOSCOPY (EGD) WITH PROPOFOL  N/A 03/18/2022   3 cm hiatal hernia, short segment Barrett's biopsied, gastritis biopsied, duodenal scar/?  Residual polyp  tissue that was resected.  Pathology with mild nonspecific reactive gastropathy, negative for H. 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  Surveillance in 5 years.   POLYPECTOMY  04/02/2020   Procedure: POLYPECTOMY;  Surgeon: Cindie Carlin POUR, DO;  [facility];  Service: Endoscopy;;   POLYPECTOMY  03/21/2021   Procedure: POLYPECTOMY;  Surgeon: Cindie Carlin POUR, DO;  [facility];  Service: Endoscopy;;  duodenal   POLYPECTOMY  03/18/2022   Procedure: POLYPECTOMY;  Surgeon: Cindie Carlin POUR, DO;  [facility];  Service: Endoscopy;;   REPAIR EXTENSOR TENDON Left 08/06/2022   Procedure: LEFT LONG FINGER EXTENSOR TENDON CENTRALIZATION;  Surgeon: Murrell Drivers, MD;  Location: [facility];  Service: Orthopedics;  Laterality: Left;  60 MIN   STERIOD INJECTION Left 08/06/2022   Procedure: LEFT CARPAL TUNNEL INJECTION;  Surgeon: Murrell Drivers, MD;  Location: [facility];  Service: Orthopedics;  Laterality: Left;   TONSILLECTOMY     Social History:  reports that he has been smoking cigars and cigarettes. He has a 30 pack-year smoking history. He has never used smokeless tobacco. He reports current alcohol use of about 2.0 standard drinks of alcohol per week. He reports that he does not use drugs.  Allergies  Allergen Reactions   Bee Venom Anaphylaxis    Family History  Problem Relation Age of Onset   Colon cancer Neg Hx        Pt knows little about family history    Prior to Admission medications   Medication Sig Start Date End Date Taking? Authorizing Provider  acetaminophen  (TYLENOL ) 500 MG tablet Take 1,000 mg by mouth every 6 (six) hours as needed for mild pain (pain score 1-3).   Yes [provider]  albuterol  (VENTOLIN  HFA) 108 (90 Base) MCG/ACT inhaler Inhale 1-2 puffs into the lungs every 6 (six) hours as needed for wheezing or shortness of breath.   Yes [provider]   amLODipine  (NORVASC ) 5 MG tablet Take 5 mg by mouth daily.   Yes [provider]  atorvastatin  (LIPITOR) 40 MG tablet Take 40 mg by mouth daily.   Yes [provider]  buPROPion  (WELLBUTRIN ) 75 MG tablet Take 150 mg by mouth 2 (two) times daily.   Yes [provider]  busPIRone  (BUSPAR ) 10 MG tablet Take 10 mg by mouth 2 (two) times daily.   Yes [provider]  esomeprazole  (NEXIUM ) 40 MG capsule Take 1 capsule (40 mg total) by mouth 2 (two) times daily before a meal. 11/15/23  Yes Harper, Kristen S, PA-C  folic acid  (FOLVITE ) 1 MG tablet Take 1 mg by mouth daily.   Yes [provider]  furosemide  (LASIX ) 20 MG tablet Take 20 mg by mouth 2 (two) times daily.   Yes [provider]  gabapentin  (NEURONTIN ) 100 MG capsule Take 100 mg by mouth 3 (three) times daily.   Yes [provider]  losartan  (COZAAR ) 50 MG tablet Take 50 mg by mouth daily.   Yes [provider]  meloxicam (MOBIC) 7.5 MG tablet Take 7.5 mg by mouth daily.   Yes [provider]  nicotine  (NICODERM CQ  - DOSED IN MG/24 HOURS) 14 mg/24hr patch Place 14 mg onto the skin daily.  Yes [provider]  tiotropium (SPIRIVA ) 18 MCG inhalation capsule Place 18 mcg into inhaler and inhale daily.   Yes [provider]  traZODone  (DESYREL ) 100 MG tablet Take 100 mg by mouth at bedtime.   Yes [provider]  zolpidem (AMBIEN) 10 MG tablet Take 10 mg by mouth at bedtime.   Yes [provider]    Physical Exam: Vitals:   02/02/24 1900 02/02/24 1914 02/02/24 1952 02/02/24 2000  BP: 109/75  (!) 149/114 123/64  Pulse: 95  98   Resp:   17   Temp:  97.7 F (36.5 C) 97.9 F (36.6 C)   TempSrc:  Oral Oral   SpO2: 99%  100%   Weight:   55.2 kg   Height:   5' 8 (1.727 m)     Physical Exam  Gen:- Awake Alert, in no acute distress  HEENT:- [place].AT, No sclera icterus Neck-Supple Neck,No JVD,.  Lungs-  CTAB , fair air movement  bilaterally  CV- S1, S2 normal, RRR Abd-  +ve B.Sounds, Abd Soft, No tenderness,    Extremity/Skin:- No  edema,   good pedal pulses  Psych-affect is appropriate, oriented x3, actually coherent and cooperative Neuro-no new focal deficits, +ve tremors  Data Reviewed: - In ED CT head without new acute findings Chest x-ray without acute findings -Blood alcohol level is 140 - CBC consistent with pancytopenia with WBC of 3.4 hemoglobin 11.3 which is higher than prior baseline platelets 116 - Creatinine 1.52 baseline 0.6-0.7, AST greater than ALT , elevated alk phos and T. bili noted - Sodium 120 was 135 on 12/06/2023 potassium is down to 3.3 chloride 86 bicarb is 16  Assessment and Plan: 1) status post fall with closed head injury--while intoxicated -CT head without acute changes - Neurochecks as ordered - Patient which was thrombocytopenia with increased risk for bleeding -history of subarachnoid hemorrhage and left sylvian Fissure back in April 2025 due to falls while intoxicated ,   2) acute hyponatremia--beer potomania/dehydration in setting of diuretic use -No Nausea, Vomiting or Diarrhea - Sodium 120 was 135 on 12/06/2023 - Stop Lasix  - Give IV fluid - Check serum osmolarity, urine sodium, urine osmolarity - Check serum sodium every 6 hours and adjust IV fluids  3) pancytopenia--multifactorial suspect some component of bone marrow suppression from toxic effects of alcohol, hepatic dysfunction from ongoing alcohol use- -WBC of 3.4 hemoglobin 11.3 which is higher than prior baseline platelets 116 -- Monitor closely  4) possible GI bleed concerns--patient reported rectal bleeding a week ago - Stool occult blood in the ED positive -??  Hemorrhoids - PPI for now - If H&H drifts down consider GI consult for possible endoluminal evaluation  5) alcohol abuse--- high risk for DT -Blood alcohol level is 140 - Lorazepam   per CIWA protocol - Thiamine  and folic acid  as ordered  6)Tobacco  abuse/COPD--smoking cessation advised, nicotine  patch advised - As needed bronchodilators as ordered  7)H/o prior ischemic CVA---chronic infarct within the right thalamus.  -CT head on admission today without new acute findings -- Not on antiplatelet agent PTA presumably due to chronic thrombocytopenia and recent subarachnoid hemorrhage -- Consider aspirin  and atorvastatin  at discharge for secondary prevention   Advance Care Planning:   Code Status: Full Code   Family Communication: None available at bedside  Severity of Illness: The appropriate patient status for this patient is OBSERVATION. Observation status is judged to be reasonable and necessary in order to provide the required intensity of service to ensure  the patient's safety. The patient's presenting symptoms, physical exam findings, and initial radiographic and laboratory data in the context of their medical condition is felt to place them at decreased risk for further clinical deterioration. Furthermore, it is anticipated that the patient will be medically stable for discharge from the hospital within 2 midnights of admission.   Author: Rendall Carwin, MD 02/02/2024 9:26 PM  For on call review [email or web address].

## 2024-02-02 NOTE — ED Provider Notes (Signed)
 [place] EMERGENCY DEPARTMENT AT [facility] Provider Note   CSN: [id number] Arrival date & time: 02/02/24  1438     Patient presents with: Head Injury and Rectal Bleeding   Gabriel Koch is a 58 y.o. male.  {Add pertinent medical, surgical, social history, OB history to YEP:67052} Patient complains   Head Injury Rectal Bleeding      Prior to Admission medications   Medication Sig Start Date End Date Taking? Authorizing Provider  acetaminophen  (TYLENOL ) 500 MG tablet Take 1,000 mg by mouth every 6 (six) hours as needed for mild pain (pain score 1-3) or moderate pain (pain score 4-6).    [provider]  albuterol  (VENTOLIN  HFA) 108 (90 Base) MCG/ACT inhaler Inhale 1 puff into the lungs every 4 (four) hours as needed for wheezing or shortness of breath. 01/18/23   Pearlean Manus, MD  amLODipine  (NORVASC ) 5 MG tablet Take 5 mg by mouth daily.    [provider]  atorvastatin  (LIPITOR) 40 MG tablet Take 40 mg by mouth daily.    [provider]  buPROPion  (WELLBUTRIN ) 75 MG tablet Take 2 tablets (150 mg total) by mouth 2 (two) times daily. 01/18/23   Pearlean Manus, MD  busPIRone  (BUSPAR ) 10 MG tablet Take 1 tablet (10 mg total) by mouth 2 (two) times daily. 01/18/23   Pearlean Manus, MD  esomeprazole  (NEXIUM ) 40 MG capsule Take 1 capsule (40 mg total) by mouth 2 (two) times daily before a meal. 11/15/23   Rudy, Josette RAMAN, PA-C  furosemide  (LASIX ) 20 MG tablet Take 20 mg by mouth daily.    [provider]  gabapentin  (NEURONTIN ) 300 MG capsule Take 1 capsule (300 mg total) by mouth 3 (three) times daily. 12/06/23 01/05/24  Maree, Pratik D, DO  HYDROcodone -acetaminophen  (NORCO/VICODIN) 5-325 MG tablet Take 1 tablet by mouth every 6 (six) hours as needed for moderate pain (pain score 4-6) or severe pain (pain score 7-10). 11/05/23   Lue Elsie BROCKS, MD  losartan  (COZAAR ) 50 MG tablet Take 50 mg by mouth daily.    [provider]  meloxicam (MOBIC) 7.5 MG tablet Take 7.5 mg by mouth daily.    [provider]  nicotine  (NICODERM CQ  - DOSED IN MG/24 HOURS) 14 mg/24hr patch Place 1 patch (14 mg total) onto the skin daily. Patient not taking: Reported on 12/03/2023 11/06/23   Lue Elsie BROCKS, MD  traZODone  (DESYREL ) 100 MG tablet Take 1 tablet (100 mg total) by mouth at bedtime. 01/18/23   Pearlean Manus, MD  zolpidem (AMBIEN) 10 MG tablet Take 10 mg by mouth at bedtime.    [provider]    Allergies: Bee venom    Review of Systems  Gastrointestinal:  Positive for hematochezia.    Updated Vital Signs BP 96/66 (BP Location: Left Arm)   Pulse 79   Temp 97.8 F (36.6 C) (Oral)   Resp 18   Ht 5' 8 (1.727 m)   Wt 54.4 kg   SpO2 99%   BMI 18.25 kg/m   Physical Exam  (all labs ordered are listed, but only abnormal results are displayed) Labs Reviewed  COMPREHENSIVE METABOLIC PANEL WITH GFR  CBC  POC OCCULT BLOOD, ED  TYPE AND SCREEN    EKG: None  Radiology: No results found.  {Document cardiac monitor, telemetry assessment procedure when appropriate:32947} Procedures   Medications Ordered in the ED - No data to display    {Click here for ABCD2, HEART and other calculators REFRESH  Note before signing:1}                              Medical Decision Making Amount and/or Complexity of Data Reviewed Labs: ordered.   ***  {Document critical care time when appropriate  Document review of labs and clinical decision tools ie CHADS2VASC2, etc  Document your independent review of radiology images and any outside records  Document your discussion with family members, caretakers and with consultants  Document social determinants of health affecting pt's care  Document your decision making why or why not admission, treatments were needed:32947:::1}   Final diagnoses:  None    ED Discharge Orders     None

## 2024-02-02 NOTE — ED Notes (Signed)
 [facility] [employer] Patients ride at discharge phone number .[phone]

## 2024-02-03 ENCOUNTER — Encounter (HOSPITAL_COMMUNITY): Payer: Self-pay | Admitting: Family Medicine

## 2024-02-03 DIAGNOSIS — F1010 Alcohol abuse, uncomplicated: Secondary | ICD-10-CM | POA: Diagnosis not present

## 2024-02-03 DIAGNOSIS — K625 Hemorrhage of anus and rectum: Secondary | ICD-10-CM | POA: Diagnosis present

## 2024-02-03 DIAGNOSIS — J4489 Other specified chronic obstructive pulmonary disease: Secondary | ICD-10-CM | POA: Diagnosis present

## 2024-02-03 DIAGNOSIS — F4310 Post-traumatic stress disorder, unspecified: Secondary | ICD-10-CM | POA: Diagnosis present

## 2024-02-03 DIAGNOSIS — Y906 Blood alcohol level of 120-199 mg/100 ml: Secondary | ICD-10-CM | POA: Diagnosis present

## 2024-02-03 DIAGNOSIS — W19XXXA Unspecified fall, initial encounter: Secondary | ICD-10-CM | POA: Diagnosis present

## 2024-02-03 DIAGNOSIS — E871 Hypo-osmolality and hyponatremia: Secondary | ICD-10-CM | POA: Diagnosis present

## 2024-02-03 DIAGNOSIS — F17290 Nicotine dependence, other tobacco product, uncomplicated: Secondary | ICD-10-CM | POA: Diagnosis present

## 2024-02-03 DIAGNOSIS — F10129 Alcohol abuse with intoxication, unspecified: Secondary | ICD-10-CM | POA: Diagnosis present

## 2024-02-03 DIAGNOSIS — F32A Depression, unspecified: Secondary | ICD-10-CM | POA: Diagnosis present

## 2024-02-03 DIAGNOSIS — D61818 Other pancytopenia: Secondary | ICD-10-CM | POA: Diagnosis present

## 2024-02-03 DIAGNOSIS — F419 Anxiety disorder, unspecified: Secondary | ICD-10-CM | POA: Diagnosis present

## 2024-02-03 DIAGNOSIS — E860 Dehydration: Secondary | ICD-10-CM | POA: Diagnosis present

## 2024-02-03 DIAGNOSIS — Z716 Tobacco abuse counseling: Secondary | ICD-10-CM | POA: Diagnosis not present

## 2024-02-03 DIAGNOSIS — K219 Gastro-esophageal reflux disease without esophagitis: Secondary | ICD-10-CM | POA: Diagnosis present

## 2024-02-03 DIAGNOSIS — F17210 Nicotine dependence, cigarettes, uncomplicated: Secondary | ICD-10-CM | POA: Diagnosis present

## 2024-02-03 DIAGNOSIS — Z8673 Personal history of transient ischemic attack (TIA), and cerebral infarction without residual deficits: Secondary | ICD-10-CM | POA: Diagnosis not present

## 2024-02-03 DIAGNOSIS — I10 Essential (primary) hypertension: Secondary | ICD-10-CM | POA: Diagnosis present

## 2024-02-03 DIAGNOSIS — Z860100 Personal history of colon polyps, unspecified: Secondary | ICD-10-CM | POA: Diagnosis not present

## 2024-02-03 DIAGNOSIS — S0990XA Unspecified injury of head, initial encounter: Secondary | ICD-10-CM | POA: Diagnosis present

## 2024-02-03 DIAGNOSIS — Z91030 Bee allergy status: Secondary | ICD-10-CM | POA: Diagnosis not present

## 2024-02-03 DIAGNOSIS — Z79899 Other long term (current) drug therapy: Secondary | ICD-10-CM | POA: Diagnosis not present

## 2024-02-03 LAB — COMPREHENSIVE METABOLIC PANEL WITH GFR
ALT: 22 U/L (ref 0–44)
AST: 47 U/L — ABNORMAL HIGH (ref 15–41)
Albumin: 3.1 g/dL — ABNORMAL LOW (ref 3.5–5.0)
Alkaline Phosphatase: 151 U/L — ABNORMAL HIGH (ref 38–126)
Anion gap: 10 (ref 5–15)
BUN: 12 mg/dL (ref 6–20)
CO2: 19 mmol/L — ABNORMAL LOW (ref 22–32)
Calcium: 8.4 mg/dL — ABNORMAL LOW (ref 8.9–10.3)
Chloride: 102 mmol/L (ref 98–111)
Creatinine, Ser: 0.73 mg/dL (ref 0.61–1.24)
GFR, Estimated: >60 mL/min (ref >60)
Glucose, Bld: 103 mg/dL — ABNORMAL HIGH (ref 70–99)
Potassium: 3.5 mmol/L (ref 3.5–5.1)
Sodium: 131 mmol/L — ABNORMAL LOW (ref 135–145)
Total Bilirubin: 1.4 mg/dL — ABNORMAL HIGH (ref 0.0–1.2)
Total Protein: 6.6 g/dL (ref 6.5–8.1)

## 2024-02-03 LAB — CBC
HCT: 24.8 % — ABNORMAL LOW (ref 39.0–52.0)
Hemoglobin: 9 g/dL — ABNORMAL LOW (ref 13.0–17.0)
MCH: 38.5 pg — ABNORMAL HIGH (ref 26.0–34.0)
MCHC: 36.3 g/dL — ABNORMAL HIGH (ref 30.0–36.0)
MCV: 106 fL — ABNORMAL HIGH (ref 80.0–100.0)
Platelets: 95 K/uL — ABNORMAL LOW (ref 150–400)
RBC: 2.34 MIL/uL — ABNORMAL LOW (ref 4.22–5.81)
RDW: 14.7 % (ref 11.5–15.5)
WBC: 1.5 K/uL — ABNORMAL LOW (ref 4.0–10.5)
nRBC: 0 % (ref 0.0–0.2)

## 2024-02-03 LAB — OSMOLALITY: Osmolality: 271 mosm/kg — ABNORMAL LOW (ref 275–295)

## 2024-02-03 LAB — MRSA NEXT GEN BY PCR, NASAL: MRSA by PCR Next Gen: NOT DETECTED

## 2024-02-03 LAB — SODIUM: Sodium: 128 mmol/L — ABNORMAL LOW (ref 135–145)

## 2024-02-03 LAB — HIV ANTIBODY (ROUTINE TESTING W REFLEX): HIV Screen 4th Generation wRfx: NONREACTIVE

## 2024-02-03 MED ORDER — BUPROPION HCL 75 MG PO TABS
150.0000 mg | ORAL_TABLET | Freq: Two times a day (BID) | ORAL | Status: DC
  Administered 2024-02-03 – 2024-02-04 (×3): 150 mg via ORAL
  Filled 2024-02-03 (×5): qty 2

## 2024-02-03 MED ORDER — TRAZODONE HCL 50 MG PO TABS
100.0000 mg | ORAL_TABLET | Freq: Every day | ORAL | Status: DC
  Administered 2024-02-03: 100 mg via ORAL
  Filled 2024-02-03: qty 2

## 2024-02-03 MED ORDER — CHLORDIAZEPOXIDE HCL 5 MG PO CAPS: 10.0000 mg | ORAL_CAPSULE | Freq: Three times a day (TID) | ORAL | Status: DC

## 2024-02-03 MED ORDER — TIOTROPIUM BROMIDE MONOHYDRATE 18 MCG IN CAPS: 18.0000 ug | ORAL_CAPSULE | Freq: Every day | RESPIRATORY_TRACT | Status: DC

## 2024-02-03 MED ORDER — UMECLIDINIUM BROMIDE 62.5 MCG/ACT IN AEPB
1.0000 | INHALATION_SPRAY | Freq: Every day | RESPIRATORY_TRACT | Status: DC
  Administered 2024-02-04: 1 via RESPIRATORY_TRACT
  Filled 2024-02-03: qty 7

## 2024-02-03 MED ORDER — CHLORDIAZEPOXIDE HCL 25 MG PO CAPS
25.0000 mg | ORAL_CAPSULE | Freq: Three times a day (TID) | ORAL | Status: DC
  Administered 2024-02-03 – 2024-02-04 (×5): 25 mg via ORAL
  Filled 2024-02-03 (×5): qty 1

## 2024-02-03 MED ORDER — BUSPIRONE HCL 5 MG PO TABS
10.0000 mg | ORAL_TABLET | Freq: Two times a day (BID) | ORAL | Status: DC
  Administered 2024-02-03 – 2024-02-04 (×3): 10 mg via ORAL
  Filled 2024-02-03 (×4): qty 2

## 2024-02-03 MED ORDER — GABAPENTIN 100 MG PO CAPS
200.0000 mg | ORAL_CAPSULE | Freq: Every day | ORAL | Status: DC
  Administered 2024-02-03: 200 mg via ORAL
  Filled 2024-02-03: qty 2

## 2024-02-03 MED ORDER — CHLORDIAZEPOXIDE HCL 5 MG PO CAPS: 5.0000 mg | ORAL_CAPSULE | Freq: Three times a day (TID) | ORAL | Status: DC

## 2024-02-03 NOTE — Hospital Course (Signed)
 58 y.o. male with medical history significant for  alcohol use disorder, history of strokes, COPD, GERD, tobacco abuse, history of subarachnoid hemorrhage and left sylvian Fissure back in April 2025 due to falls while intoxicated ,  , recurrent admissions for [facility] potomania/hyponatremia presents to the ED today again after fall with closed head injury in the setting of ongoing alcohol abuse/acute alcohol intoxication. - CBG 80 - Patient denies significant headaches, no visual disturbance no new focal deficits No fever  Or chills    No Nausea, Vomiting or Diarrhea - In ED CT head without new acute findings Chest x-ray without acute findings -Blood alcohol level is 140 - CBC consistent with pancytopenia with WBC of 3.4 hemoglobin 11.3 which is higher than prior baseline platelets 116 - Creatinine 1.52 baseline 0.6-0.7, AST greater than ALT , elevated alk phos and T. bili noted - Sodium 120 was 135 on 12/06/2023 potassium is down to 3.3 chloride 86 bicarb is 16

## 2024-02-03 NOTE — TOC Initial Note (Signed)
 Transition of Care [facility]) - Initial/Assessment Note    Patient Details  Name: Gabriel Koch MRN: [id number] Date of Birth: 09-09-1965  Transition of Care [facility]) CM/SW Contact:    Sharlyne Stabs, RN Phone Number: 02/03/2024, 1:31 PM  Clinical Narrative:   Patient admitted with Hyponatremia. TOC consulted for substance abuse. CM at the bedside. Patient admits to drinking 4-8 beers a day, depending on mood. We reviewed the local resources at [facility] Packet give to patient for him to follow up and make an appointment.  More information added to AVS.               Patient Goals and CMS Choice Patient states their goals for this hospitalization and ongoing recovery are:: return home CMS Medicare.gov Compare Post Acute Care list provided to:: Patient Choice offered to / list presented to : Patient [place] ownership interest in [facility].provided to:: Patient    Expected Discharge Plan and Services       Living arrangements for the past 2 months: Single Family Home                     Prior Living Arrangements/Services Living arrangements for the past 2 months: Single Family Home Lives with:: Self       Activities of Daily Living   ADL Screening (condition at time of admission) Independently performs ADLs?: No Does the patient have a NEW difficulty with bathing/dressing/toileting/self-feeding that is expected to last >3 days?: Yes (Initiates electronic notice to provider for possible OT consult) Does the patient have a NEW difficulty with getting in/out of bed, walking, or climbing stairs that is expected to last >3 days?: Yes (Initiates electronic notice to provider for possible PT consult) Does the patient have a NEW difficulty with communication that is expected to last >3 days?: No Is the patient deaf or have difficulty hearing?: No Does the patient have difficulty seeing, even when wearing glasses/contacts?: No Does the patient have difficulty  concentrating, remembering, or making decisions?: Yes  Permission Sought/Granted     Emotional Assessment     Affect (typically observed): Accepting Orientation: : Oriented to Self, Oriented to Place, Oriented to Situation Alcohol / Substance Use: Alcohol Use Psych Involvement: No (comment)  Admission diagnosis:  Hyponatremia [E87.1] Patient Active Problem List   Diagnosis Date Noted   Subarachnoid hemorrhage (HCC) 11/02/2023   Falls 11/02/2023   Alcohol intoxication (HCC) 11/02/2023   Colitis 01/11/2023   Generalized abdominal pain 12/27/2022   Rib deformity 12/27/2022   Pancytopenia (HCC) 12/27/2022   Loss of weight 12/27/2022   Diarrhea 12/27/2022   Prolapsed internal hemorrhoids, grade 2 03/26/2022   Barrett's esophagus 03/03/2022   Hyponatremia 12/14/2021   Discoloration of skin of flank resembling ecchymosis 12/14/2021   Coronary artery calcification 12/14/2021   Tobacco abuse 04/30/2020   Alcohol abuse 04/30/2020   Noncompliance with diet and medication regimen 04/30/2020   Left arm numbness 04/29/2020   Prolonged QT interval 04/29/2020   Hypokalemia 04/29/2020   Hypomagnesemia 04/29/2020   Stroke (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 04/29/2020   Facial weakness 04/28/2020   HTN (hypertension) 01/20/2020   GERD (gastroesophageal reflux disease) 01/19/2020   Vomiting 01/19/2020   Rectal bleeding 01/19/2020   Alternating constipation and diarrhea 01/19/2020   PCP:  Bertell Satterfield, MD Pharmacy:   [facility] - [place], [place] - [address] [place] [address] Phone: [phone] Fax: [phone]  [facility] [address] [place] [address] Phone: [phone] Fax: [phone]     Social Drivers of Health (SDOH) Social History: SDOH Screenings   Food Insecurity: No Food Insecurity (02/02/2024)  Housing: Low Risk  (02/02/2024)  Transportation Needs: No  Transportation Needs (02/02/2024)  Utilities: Not At Risk (02/02/2024)  Tobacco Use: High Risk (12/03/2023)   SDOH Interventions:     Readmission Risk Interventions     No data to display

## 2024-02-03 NOTE — Plan of Care (Signed)
  Problem: Activity: Goal: Risk for activity intolerance will decrease Outcome: Not Progressing   Problem: Nutrition: Goal: Adequate nutrition will be maintained Outcome: Progressing   Problem: Coping: Goal: Level of anxiety will decrease Outcome: Progressing   Problem: Elimination: Goal: Will not experience complications related to bowel motility Outcome: Not Progressing

## 2024-02-03 NOTE — Plan of Care (Signed)
  Problem: Education: Goal: Knowledge of General Education information will improve Description: Including pain rating scale, medication(s)/side effects and non-pharmacologic comfort measures 02/03/2024 1600 by Woodard Deidre CROME, RN Outcome: Progressing 02/03/2024 1600 by Woodard Deidre CROME, RN Outcome: Progressing   Problem: Health Behavior/Discharge Planning: Goal: Ability to manage health-related needs will improve 02/03/2024 1600 by Woodard Deidre CROME, RN Outcome: Progressing 02/03/2024 1600 by Woodard Deidre CROME, RN Outcome: Progressing   Problem: Clinical Measurements: Goal: Ability to maintain clinical measurements within normal limits will improve 02/03/2024 1600 by Woodard Deidre CROME, RN Outcome: Progressing 02/03/2024 1600 by Woodard Deidre CROME, RN Outcome: Progressing Goal: Will remain free from infection 02/03/2024 1600 by Woodard Deidre CROME, RN Outcome: Progressing 02/03/2024 1600 by Woodard Deidre CROME, RN Outcome: Progressing Goal: Diagnostic test results will improve 02/03/2024 1600 by Woodard Deidre CROME, RN Outcome: Progressing 02/03/2024 1600 by Woodard Deidre CROME, RN Outcome: Progressing Goal: Respiratory complications will improve 02/03/2024 1600 by Woodard Deidre CROME, RN Outcome: Progressing 02/03/2024 1600 by Woodard Deidre CROME, RN Outcome: Progressing Goal: Cardiovascular complication will be avoided 02/03/2024 1600 by Woodard Deidre CROME, RN Outcome: Progressing 02/03/2024 1600 by Woodard Deidre CROME, RN Outcome: Progressing   Problem: Activity: Goal: Risk for activity intolerance will decrease 02/03/2024 1600 by Woodard Deidre CROME, RN Outcome: Not Progressing 02/03/2024 1600 by Woodard Deidre CROME, RN Outcome: Progressing   Problem: Nutrition: Goal: Adequate nutrition will be maintained 02/03/2024 1600 by Woodard Deidre CROME, RN Outcome: Progressing 02/03/2024 1600 by Woodard Deidre CROME, RN Outcome: Progressing   Problem: Coping: Goal: Level of anxiety will  decrease 02/03/2024 1600 by Woodard Deidre CROME, RN Outcome: Progressing 02/03/2024 1600 by Woodard Deidre CROME, RN Outcome: Progressing   Problem: Elimination: Goal: Will not experience complications related to bowel motility 02/03/2024 1600 by Woodard Deidre CROME, RN Outcome: Progressing 02/03/2024 1600 by Woodard Deidre CROME, RN Outcome: Progressing Goal: Will not experience complications related to urinary retention 02/03/2024 1600 by Woodard Deidre CROME, RN Outcome: Progressing 02/03/2024 1600 by Woodard Deidre CROME, RN Outcome: Progressing   Problem: Pain Managment: Goal: General experience of comfort will improve and/or be controlled 02/03/2024 1600 by Woodard Deidre CROME, RN Outcome: Progressing 02/03/2024 1600 by Woodard Deidre CROME, RN Outcome: Progressing   Problem: Safety: Goal: Ability to remain free from injury will improve 02/03/2024 1600 by Woodard Deidre CROME, RN Outcome: Progressing 02/03/2024 1600 by Woodard Deidre CROME, RN Outcome: Progressing   Problem: Skin Integrity: Goal: Risk for impaired skin integrity will decrease 02/03/2024 1600 by Woodard Deidre CROME, RN Outcome: Progressing 02/03/2024 1600 by Woodard Deidre CROME, RN Outcome: Progressing

## 2024-02-03 NOTE — Progress Notes (Signed)
 PROGRESS NOTE   Gabriel Koch  FMW:[id number] DOB: December 09, 1965 DOA: 02/02/2024 PCP: Bertell Satterfield, MD   Chief Complaint  Patient presents with   Head Injury   Rectal Bleeding   Level of care: Stepdown  Brief Admission History:  58 y.o. male with medical history significant for  alcohol use disorder, history of strokes, COPD, GERD, tobacco abuse, history of subarachnoid hemorrhage and left sylvian Fissure back in April 2025 due to falls while intoxicated ,  , recurrent admissions for [facility] potomania/hyponatremia presents to the ED today again after fall with closed head injury in the setting of ongoing alcohol abuse/acute alcohol intoxication. - CBG 80 - Patient denies significant headaches, no visual disturbance no new focal deficits No fever  Or chills    No Nausea, Vomiting or Diarrhea - In ED CT head without new acute findings Chest x-ray without acute findings -Blood alcohol level is 140 - CBC consistent with pancytopenia with WBC of 3.4 hemoglobin 11.3 which is higher than prior baseline platelets 116 - Creatinine 1.52 baseline 0.6-0.7, AST greater than ALT , elevated alk phos and T. bili noted - Sodium 120 was 135 on 12/06/2023 potassium is down to 3.3 chloride 86 bicarb is 16   Assessment and Plan:  status post fall with closed head injury--while intoxicated -CT head without acute changes - Neurochecks as ordered - Patient which was thrombocytopenia with increased risk for bleeding -history of subarachnoid hemorrhage and left sylvian Fissure back in April 2025 due to falls while intoxicated ,     acute hyponatremia--beer potomania/dehydration in setting of diuretic use -No Nausea, Vomiting or Diarrhea - Sodium slowly improving now up to 131 - holding Lasix  - Give IV fluid - reduce rate to slow rate of sodium correction - Checking serum osmolarity, urine sodium, urine osmolarity   pancytopenia--multifactorial suspect some component of bone marrow suppression from  toxic effects of alcohol, hepatic dysfunction from ongoing alcohol use- -WBC of 3.4 hemoglobin 11.3 which is higher than prior baseline platelets 116 -- Monitor closely    possible GI bleed concerns--patient reported rectal bleeding a week ago - Stool occult blood in the ED positive -??  Hemorrhoids - PPI for now - If H&H drifts down consider GI consult for possible endoluminal evaluation   alcohol abuse--- high risk for DT -Blood alcohol level is 140 - Lorazepam   per CIWA protocol - Thiamine  and folic acid  as ordered - adding librium  taper to try to avoid acute alcohol withdrawal    Tobacco abuse/COPD--smoking cessation advised, nicotine  patch advised - As needed bronchodilators as ordered   H/o prior ischemic CVA---chronic infarct within the right thalamus.  -CT head on admission today without new acute findings -- Not on antiplatelet agent PTA presumably due to chronic thrombocytopenia and recent subarachnoid hemorrhage -- Consider aspirin  and atorvastatin  at discharge for secondary prevention   DVT prophylaxis: SCDs Code Status: Full  Family Communication:  Disposition: Status is: Observation   Consultants:   Procedures:   Antimicrobials:    Subjective: Pt having shaking in hands, says he has history of severe alcohol delirum tremens   Objective: Vitals:   02/03/24 0900 02/03/24 0905 02/03/24 1000 02/03/24 1140  BP: 123/80 123/80 (!) 148/62   Pulse: 81 86 87   Resp: (!) 26  17   Temp:    98 F (36.7 C)  TempSrc:    Oral  SpO2: 99%  98%   Weight:      Height:        Intake/Output  Summary (Last 24 hours) at 02/03/2024 1404 Last data filed at 02/03/2024 1348 Gross per 24 hour  Intake 1716 ml  Output 1775 ml  Net -59 ml   Filed Weights   02/02/24 1459 02/02/24 1501 02/02/24 1952  Weight: 59.2 kg 54.4 kg 55.2 kg   Examination:  General exam: Appears anxious with resting tremors  Respiratory system: Clear to auscultation. Respiratory effort  normal. Cardiovascular system: normal S1 & S2 heard. No JVD, murmurs, rubs, gallops or clicks. No pedal edema. Gastrointestinal system: Abdomen is nondistended, soft and nontender. No organomegaly or masses felt. Normal bowel sounds heard. Central nervous system: Alert and oriented. No focal neurological deficits. Extremities: Symmetric 5 x 5 power. Skin: No rashes, lesions or ulcers. Psychiatry: Judgement and insight appear poor. Mood & affect appropriate.   Data Reviewed: I have personally reviewed following labs and imaging studies  CBC: Recent Labs  Lab 02/02/24 1501 02/03/24 0746  WBC 3.4* 1.5*  HGB 11.3* 9.0*  HCT 30.9* 24.8*  MCV 104.7* 106.0*  PLT 116* 95*    Basic Metabolic Panel: Recent Labs  Lab 02/02/24 1501 02/02/24 2134 02/03/24 0332 02/03/24 0746  NA 120* 124* 128* 131*  K 3.3*  --   --  3.5  CL 86*  --   --  102  CO2 16*  --   --  19*  GLUCOSE 77  --   --  103*  BUN 17  --   --  12  CREATININE 1.52*  --   --  0.73  CALCIUM  9.6  --   --  8.4*    CBG: No results for input(s): GLUCAP in the last 168 hours.  Recent Results (from the past 240 hours)  MRSA Next Gen by PCR, Nasal     Status: None   Collection Time: 02/02/24  7:50 PM   Specimen: Nasal Mucosa; Nasal Swab  Result Value Ref Range Status   MRSA by PCR Next Gen NOT DETECTED NOT DETECTED Final    Comment: (NOTE) The GeneXpert MRSA Assay (FDA approved for NASAL specimens only), is one component of a comprehensive MRSA colonization surveillance program. It is not intended to diagnose MRSA infection nor to guide or monitor treatment for MRSA infections. Test performance is not FDA approved in patients less than 26 years old. Performed at [facility], [address] [place], [place] [address]      Radiology Studies: CT Head Wo Contrast Result Date: 02/02/2024 CLINICAL DATA:  Provided history: Head trauma, abnormal mental status. EXAM: CT HEAD WITHOUT CONTRAST TECHNIQUE: Contiguous axial  images were obtained from the base of the skull through the vertex without intravenous contrast. RADIATION DOSE REDUCTION: This exam was performed according to the departmental dose-optimization program which includes automated exposure control, adjustment of the mA and/or kV according to patient size and/or use of iterative reconstruction technique. COMPARISON:  Prior head CT examinations 11/02/2023 and earlier. FINDINGS: Brain: Age advanced generalized cerebral atrophy. Patchy and ill-defined hypoattenuation within the cerebral white matter, nonspecific but compatible with mild chronic small vessel ischemic disease. Known chronic infarct within the right thalamus. There is no acute intracranial hemorrhage. No demarcated cortical infarct. No extra-axial fluid collection. No evidence of an intracranial mass. No midline shift. Vascular: No hyperdense vessel. Atherosclerotic calcifications. Skull: No calvarial fracture or aggressive osseous lesion. Sinuses/Orbits: No mass or acute finding within the imaged orbits. Minimal mucosal thickening within the left maxillary sinus at the imaged levels. IMPRESSION: 1. No evidence of an acute intracranial abnormality. 2.  Mild chronic small vessel ischemic changes within the cerebral white matter. 3. Known chronic infarct within the right thalamus. 4. Age-advanced generalized cerebral atrophy. 5. Minor mucosal thickening within the left maxillary sinus at the imaged levels. Electronically Signed   By: Rockey Childs D.O.   On: 02/02/2024 16:47   DG Chest Port 1 View Result Date: 02/02/2024 CLINICAL DATA:  Weakness.  History of recent falls. EXAM: PORTABLE CHEST 1 VIEW COMPARISON:  CT chest, abdomen, and pelvis dated 11/02/2023. FINDINGS: The heart size and mediastinal contours are within normal limits. Aortic atherosclerosis. No focal consolidation, pleural effusion, or pneumothorax. Remote left-sided rib fractures. No acute osseous abnormality. IMPRESSION: No acute findings in  the chest. Electronically Signed   By: Harrietta Sherry M.D.   On: 02/02/2024 15:32    Scheduled Meds:  buPROPion   150 mg Oral BID   busPIRone   10 mg Oral BID   chlordiazePOXIDE   25 mg Oral TID   Followed by   NOREEN ON 02/05/2024] chlordiazePOXIDE   10 mg Oral TID   Followed by   NOREEN ON 02/07/2024] chlordiazePOXIDE   5 mg Oral TID   Chlorhexidine  Gluconate Cloth  6 each Topical Q0600   folic acid   1 mg Oral Daily   gabapentin   200 mg Oral QHS   multivitamin with minerals  1 tablet Oral Daily   nicotine   21 mg Transdermal Daily   pantoprazole   40 mg Oral BID   sodium chloride  flush  3 mL Intravenous Q12H   sodium chloride  flush  3 mL Intravenous Q12H   thiamine   100 mg Oral Daily   Or   thiamine   100 mg Intravenous Daily   traZODone   100 mg Oral QHS   umeclidinium bromide   1 puff Inhalation Daily   Continuous Infusions:  sodium chloride  125 mL/hr at 02/03/24 1002   sodium chloride        LOS: 0 days   Time spent: 55 mins  Jakeob Tullis Vicci, MD How to contact the [facility] Attending or Consulting provider 7A - 7P or covering provider during after hours 7P -7A, for this patient?  Check the care team in [facility] and look for a) attending/consulting TRH provider listed and b) the TRH team listed Log into [email or web address] to find provider on call.  Locate the TRH provider you are looking for under [facility] and page to a number that you can be directly reached. If you still have difficulty reaching the provider, please page the [facility] (Director on Call) for the Hospitalists listed on amion for assistance.  02/03/2024, 2:04 PM

## 2024-02-04 DIAGNOSIS — E871 Hypo-osmolality and hyponatremia: Secondary | ICD-10-CM | POA: Diagnosis not present

## 2024-02-04 LAB — COMPREHENSIVE METABOLIC PANEL WITH GFR
ALT: 18 U/L (ref 0–44)
AST: 36 U/L (ref 15–41)
Albumin: 2.7 g/dL — ABNORMAL LOW (ref 3.5–5.0)
Alkaline Phosphatase: 132 U/L — ABNORMAL HIGH (ref 38–126)
Anion gap: 8 (ref 5–15)
BUN: 8 mg/dL (ref 6–20)
CO2: 20 mmol/L — ABNORMAL LOW (ref 22–32)
Calcium: 8.1 mg/dL — ABNORMAL LOW (ref 8.9–10.3)
Chloride: 107 mmol/L (ref 98–111)
Creatinine, Ser: 0.58 mg/dL — ABNORMAL LOW (ref 0.61–1.24)
GFR, Estimated: >60 mL/min (ref >60)
Glucose, Bld: 94 mg/dL (ref 70–99)
Potassium: 3.2 mmol/L — ABNORMAL LOW (ref 3.5–5.1)
Sodium: 135 mmol/L (ref 135–145)
Total Bilirubin: 0.6 mg/dL (ref 0.0–1.2)
Total Protein: 6 g/dL — ABNORMAL LOW (ref 6.5–8.1)

## 2024-02-04 LAB — CBC
HCT: 24 % — ABNORMAL LOW (ref 39.0–52.0)
Hemoglobin: 8.4 g/dL — ABNORMAL LOW (ref 13.0–17.0)
MCH: 38 pg — ABNORMAL HIGH (ref 26.0–34.0)
MCHC: 35 g/dL (ref 30.0–36.0)
MCV: 108.6 fL — ABNORMAL HIGH (ref 80.0–100.0)
Platelets: 76 K/uL — ABNORMAL LOW (ref 150–400)
RBC: 2.21 MIL/uL — ABNORMAL LOW (ref 4.22–5.81)
RDW: 15.4 % (ref 11.5–15.5)
WBC: 2 K/uL — ABNORMAL LOW (ref 4.0–10.5)
nRBC: 0 % (ref 0.0–0.2)

## 2024-02-04 MED ORDER — VITAMIN B-1 100 MG PO TABS: 100.0000 mg | ORAL_TABLET | Freq: Every day | ORAL | 0 refills | Status: AC

## 2024-02-04 MED ORDER — POTASSIUM CHLORIDE CRYS ER 20 MEQ PO TBCR
60.0000 meq | EXTENDED_RELEASE_TABLET | Freq: Once | ORAL | Status: AC
  Administered 2024-02-04: 60 meq via ORAL
  Filled 2024-02-04: qty 3

## 2024-02-04 MED ORDER — MELOXICAM 7.5 MG PO TABS: 7.5000 mg | ORAL_TABLET | Freq: Every day | ORAL | Status: AC | PRN

## 2024-02-04 MED ORDER — ADULT MULTIVITAMIN W/MINERALS CH: 1.0000 | ORAL_TABLET | Freq: Every day | ORAL | Status: AC

## 2024-02-04 NOTE — Plan of Care (Signed)
  Problem: Acute Rehab PT Goals(only PT should resolve) Goal: Pt Will Go Supine/Side To Sit Outcome: Progressing Flowsheets (Taken 02/04/2024 1246) Pt will go Supine/Side to Sit: with modified independence Goal: Patient Will Transfer Sit To/From Stand Outcome: Progressing Flowsheets (Taken 02/04/2024 1246) Patient will transfer sit to/from stand: with modified independence Goal: Pt Will Transfer Bed To Chair/Chair To Bed Outcome: Progressing Flowsheets (Taken 02/04/2024 1246) Pt will Transfer Bed to Chair/Chair to Bed: with modified independence Goal: Pt Will Ambulate Outcome: Progressing Flowsheets (Taken 02/04/2024 1246) Pt will Ambulate:  25 feet  with least restrictive assistive device  with modified independence   12:46 PM, 02/04/24 Rosaria Settler, PT, DPT [place] with [facility]

## 2024-02-04 NOTE — TOC Transition Note (Signed)
 Transition of Care [facility]) - Discharge Note   Patient Details  Name: Gabriel Koch MRN: [id number] Date of Birth: 04/28/1966  Transition of Care [facility]) CM/SW Contact:  Noreen KATHEE Cleotilde ISRAEL Phone Number: 02/04/2024, 1:43 PM   Clinical Narrative:     CSW spoke with patient at bedside since PT recommended SNF. However, no facility will accept his [employer] health insurance plan, which was explained to patient . CSW offered outpatient PT and patient stated,  Once I get home , I am staying home. Patient got upset when CSW spoke about getting him transportation since he said that he would have to inform [facility]ATs two days in advance. Patient stated,  I have rights, and I was told that I would leave tomorrow . MD and nurse made aware. TOC signing off.   Final next level of care: Home/Self Care Barriers to Discharge: Barriers Resolved   Patient Goals and CMS Choice Patient states their goals for this hospitalization and ongoing recovery are:: return home CMS Medicare.gov Compare Post Acute Care list provided to:: Patient Choice offered to / list presented to : Patient [place] ownership interest in [facility].provided to:: Patient    Discharge Placement                Patient to be transferred to facility by: [facility] Name of family member notified: Elsie Patient and family notified of of transfer: 02/04/24  Discharge Plan and Services Additional resources added to the After Visit Summary for                            [facility] Arranged:  (Refused OPPT)          Social Drivers of Health (SDOH) Interventions SDOH Screenings   Food Insecurity: No Food Insecurity (02/02/2024)  Housing: Low Risk  (02/02/2024)  Transportation Needs: No Transportation Needs (02/02/2024)  Utilities: Not At Risk (02/02/2024)  Tobacco Use: High Risk (12/03/2023)     Readmission Risk Interventions    02/04/2024    1:42 PM 02/04/2024   11:35 AM  Readmission Risk Prevention Plan   Transportation Screening Complete Complete  Home Care Screening Complete Complete  Medication Review (RN CM) Complete Complete

## 2024-02-04 NOTE — Evaluation (Signed)
 Occupational Therapy Evaluation Patient Details Name: Gabriel Koch MRN: [id number] DOB: 1965/12/08 Today's Date: 02/04/2024   History of Present Illness   Gabriel Koch is a 58 y.o. male with medical history significant for  alcohol use disorder, history of strokes, COPD, GERD, tobacco abuse, history of subarachnoid hemorrhage and left sylvian Fissure back in April 2025 due to falls while intoxicated ,  , recurrent admissions for [facility] potomania/hyponatremia presents to the ED today again after fall with closed head injury in the setting of ongoing alcohol abuse/acute alcohol intoxication.     Clinical Impressions Pt agreeable to OT and PT co-evaluation. Pt reports having more falls than he can remember at home. Pt uses a cane for ambulation and completes ADL's with very slow, labored movement. Pt required CGA to min A for step pivot transfer with cane and min to mod A for ambulation in the room with cane and leaning on IV pole. Pt is a very high fall risk at this time. Pt reports that his home is too small for RW use at home. Pt will benefit from continued OT in the hospital and recommended venue below to increase strength, balance, and endurance for safe ADL's.        If plan is discharge home, recommend the following:   A little help with walking and/or transfers;A little help with bathing/dressing/bathroom;Assist for transportation;Help with stairs or ramp for entrance     Functional Status Assessment   Patient has had a recent decline in their functional status and demonstrates the ability to make significant improvements in function in a reasonable and predictable amount of time.     Equipment Recommendations   None recommended by OT             Precautions/Restrictions   Precautions Precautions: Fall Recall of Precautions/Restrictions: Intact Restrictions Weight Bearing Restrictions Per Provider Order: No     Mobility Bed Mobility Overal bed mobility:  Needs Assistance Bed Mobility: Supine to Sit     Supine to sit: HOB elevated, Supervision     General bed mobility comments: mild labored movement    Transfers Overall transfer level: Needs assistance Equipment used: Straight cane Transfers: Sit to/from Stand, Bed to chair/wheelchair/BSC Sit to Stand: Contact guard assist, Min assist     Step pivot transfers: Contact guard assist, Min assist     General transfer comment: very slow and labored movement      Balance Overall balance assessment: Needs assistance Sitting-balance support: No upper extremity supported, Feet supported Sitting balance-Leahy Scale: Good Sitting balance - Comments: seated at EOB   Standing balance support: During functional activity, Bilateral upper extremity supported, Reliant on assistive device for balance Standing balance-Leahy Scale: Poor Standing balance comment: using RW and IV pole                           ADL either performed or assessed with clinical judgement   ADL Overall ADL's : Needs assistance/impaired     Grooming: Set up;Sitting   Upper Body Bathing: Set up;Sitting   Lower Body Bathing: Sitting/lateral leans;Minimal assistance;Set up   Upper Body Dressing : Set up;Sitting   Lower Body Dressing: Set up;Minimal assistance;Sitting/lateral leans   Toilet Transfer: Contact guard assist;Minimal assistance;Stand-pivot;Rolling walker (2 wheels) Toilet Transfer Details (indicate cue type and reason): EOB to chair with SPC Toileting- Clothing Manipulation and Hygiene: Set up;Minimal assistance;Sitting/lateral lean       Functional mobility during ADLs: Minimal assistance;Moderate assistance;Rolling  walker (2 wheels) General ADL Comments: Able to ambualte to window and back to chair with cane and leaning on IV pole; very slow movement.     Vision Baseline Vision/History: 1 Wears glasses Ability to See in Adequate Light: 1 Impaired Patient Visual Report: No change  from baseline Vision Assessment?: Wears glasses for reading     Perception Perception: Not tested       Praxis Praxis: Not tested       Pertinent Vitals/Pain Pain Assessment Pain Assessment: Faces Faces Pain Scale: Hurts a little bit Pain Location: head Pain Descriptors / Indicators: Headache Pain Intervention(s): Monitored during session     Extremity/Trunk Assessment Upper Extremity Assessment Upper Extremity Assessment: Generalized weakness (3-/5 shoulder flexion bilaterally; some pain with P/ROM to reach full shoulder flexion.)   Lower Extremity Assessment Lower Extremity Assessment: Defer to PT evaluation   Cervical / Trunk Assessment Cervical / Trunk Assessment: Kyphotic   Communication Communication Communication: No apparent difficulties   Cognition Arousal: Alert Behavior During Therapy: WFL for tasks assessed/performed Cognition: No apparent impairments                               Following commands: Intact       Cueing  General Comments   Cueing Techniques: Verbal cues;Tactile cues                 Home Living Family/patient expects to be discharged to:: Private residence Living Arrangements: Alone Available Help at Discharge: Other (Comment) (Pt reports no assist.) Type of Home: House Home Access: Stairs to enter [employer] of Steps: 2 (cinderblocks) Entrance Stairs-Rails: None Home Layout: One level     Bathroom Shower/Tub: Chief Strategy Officer: Standard     Home Equipment: Cane - single Librarian, academic (2 wheels)          Prior Functioning/Environment Prior Level of Function : Independent/Modified Independent;History of Falls (last six months)             Mobility Comments: using cane intermittently, reports hx of falls; home to small to use RW ADLs Comments: Pt reports indpendence for ADL's.    OT Problem List: Decreased strength;Decreased activity tolerance;Impaired balance  (sitting and/or standing)   OT Treatment/Interventions: Self-care/ADL training;Therapeutic exercise;Therapeutic activities;Patient/family education;Balance training;DME and/or AE instruction;Energy conservation      OT Goals(Current goals can be found in the care plan section)   Acute Rehab OT Goals Patient Stated Goal: improve function OT Goal Formulation: With patient Time For Goal Achievement: 02/18/24 Potential to Achieve Goals: Good   OT Frequency:  Min 2X/week    Co-evaluation PT/OT/SLP Co-Evaluation/Treatment: Yes Reason for Co-Treatment: To address functional/ADL transfers   OT goals addressed during session: ADL's and self-care                       End of Session Equipment Utilized During Treatment: Gait belt;Other (comment) (cane)  Activity Tolerance: Patient tolerated treatment well Patient left: in chair;with call bell/phone within reach  OT Visit Diagnosis: Unsteadiness on feet (R26.81);Other abnormalities of gait and mobility (R26.89);Muscle weakness (generalized) (M62.81);History of falling (Z91.81)                Time: 1010-1032 OT Time Calculation (min): 22 min Charges:  OT General Charges $OT Visit: 1 Visit OT Evaluation $OT Eval Low Complexity: 1 Low  Nuri Larmer OT, MOT  Jayson Person 02/04/2024, 12:12 PM

## 2024-02-04 NOTE — Discharge Instructions (Signed)
IMPORTANT INFORMATION: PAY CLOSE ATTENTION  ? ?PHYSICIAN DISCHARGE INSTRUCTIONS ? ?Follow with Primary care provider  Fusco, Lawrence, MD  and other consultants as instructed by your Hospitalist Physician ? ?SEEK MEDICAL CARE OR RETURN TO EMERGENCY ROOM IF SYMPTOMS COME BACK, WORSEN OR NEW PROBLEM DEVELOPS  ? ?Please note: ?You were cared for by a hospitalist during your hospital stay. Every effort will be made to forward records to your primary care provider.  You can request that your primary care provider send for your hospital records if they have not received them.  Once you are discharged, your primary care physician will handle any further medical issues. Please note that NO REFILLS for any discharge medications will be authorized once you are discharged, as it is imperative that you return to your primary care physician (or establish a relationship with a primary care physician if you do not have one) for your post hospital discharge needs so that they can reassess your need for medications and monitor your lab values. ? ?Please get a complete blood count and chemistry panel checked by your Primary MD at your next visit, and again as instructed by your Primary MD. ? ?Get Medicines reviewed and adjusted: ?Please take all your medications with you for your next visit with your Primary MD ? ?Laboratory/radiological data: ?Please request your Primary MD to go over all hospital tests and procedure/radiological results at the follow up, please ask your primary care provider to get all Hospital records sent to his/her office. ? ?In some cases, they will be blood work, cultures and biopsy results pending at the time of your discharge. Please request that your primary care provider follow up on these results. ? ?If you are diabetic, please bring your blood sugar readings with you to your follow up appointment with primary care.   ? ?Please call and make your follow up appointments as soon as possible.   ? ?Also Note  the following: ?If you experience worsening of your admission symptoms, develop shortness of breath, life threatening emergency, suicidal or homicidal thoughts you must seek medical attention immediately by calling 911 or calling your MD immediately  if symptoms less severe. ? ?You must read complete instructions/literature along with all the possible adverse reactions/side effects for all the Medicines you take and that have been prescribed to you. Take any new Medicines after you have completely understood and accpet all the possible adverse reactions/side effects.  ? ?Do not drive when taking Pain medications or sleeping medications (Benzodiazepines) ? ?Do not take more than prescribed Pain, Sleep and Anxiety Medications. It is not advisable to combine anxiety,sleep and pain medications without talking with your primary care practitioner ? ?Special Instructions: If you have smoked or chewed Tobacco  in the last 2 yrs please stop smoking, stop any regular Alcohol  and or any Recreational drug use. ? ?Wear Seat belts while driving.  Do not drive if taking any narcotic, mind altering or controlled substances or recreational drugs or alcohol.  ? ? ? ? ? ?

## 2024-02-04 NOTE — Evaluation (Signed)
 Physical Therapy Evaluation Patient Details Name: Gabriel Koch MRN: [id number] DOB: [birth year] Today's Date: 02/04/2024  History of Present Illness  Gabriel Koch is a 58 y.o. male with medical history significant for  alcohol use disorder, history of strokes, COPD, GERD, tobacco abuse, history of subarachnoid hemorrhage and left sylvian Fissure back in April 2025 due to falls while intoxicated ,  , recurrent admissions for [facility] potomania/hyponatremia presents to the ED today again after fall with closed head injury in the setting of ongoing alcohol abuse/acute alcohol intoxication.   Clinical Impression  Patient tolerated OT/PT evaluation well, although limited due to fatigue and general weakness. Patient was mod Independent for bed mobility but required min A for tranfers and ambulation with AD. Patient reports not having any support or assistance at home. Patient very unsteady on feet, requiring use of SPC and IV pole. Patient reporting not having enough space in home to use RW but also reports numerous falls at home. Patient returned to recliner with call button within reach and chair alarm set. Patient will benefit from continued skilled physical therapy acutely and in recommended venue in order to address deficits to improve overall function.       If plan is discharge home, recommend the following: A little help with walking and/or transfers;Assist for transportation;Help with stairs or ramp for entrance   Can travel by private vehicle        Equipment Recommendations None recommended by PT  Recommendations for Other Services       Functional Status Assessment Patient has had a recent decline in their functional status and demonstrates the ability to make significant improvements in function in a reasonable and predictable amount of time.     Precautions / Restrictions Precautions Precautions: Fall Recall of Precautions/Restrictions: Intact Restrictions Weight Bearing  Restrictions Per Provider Order: No      Mobility  Bed Mobility Overal bed mobility: Needs Assistance Bed Mobility: Supine to Sit     Supine to sit: HOB elevated, Modified independent (Device/Increase time), Supervision     General bed mobility comments: Slow labored movement    Transfers Overall transfer level: Needs assistance Equipment used: Straight cane Transfers: Sit to/from Stand, Bed to chair/wheelchair/BSC Sit to Stand: Contact guard assist, Min assist   Step pivot transfers: Contact guard assist, Min assist       General transfer comment: Slow labored movement, unsteady on feet    Ambulation/Gait Ambulation/Gait assistance: Contact guard assist, Min assist Gait Distance (Feet): 20 Feet Assistive device: IV Pole, Straight cane Gait Pattern/deviations: Step-to pattern, Decreased step length - right, Decreased step length - left, Knee flexed in stance - left, Knee flexed in stance - right, Trunk flexed, Wide base of support Gait velocity: Dec     General Gait Details: pt demo very short shuffled steps, using SPC and IV pole for stability. On return ambulation, pt leans onto wall, reporting fatigue, min A needed for safety.  Stairs            Wheelchair Mobility     Tilt Bed    Modified Rankin (Stroke Patients Only)       Balance Overall balance assessment: Needs assistance Sitting-balance support: No upper extremity supported, Feet supported Sitting balance-Leahy Scale: Good Sitting balance - Comments: seated at EOB   Standing balance support: During functional activity, Bilateral upper extremity supported, Reliant on assistive device for balance Standing balance-Leahy Scale: Poor Standing balance comment: w/ RW, IV pole, and CGA/min A  Pertinent Vitals/Pain Pain Assessment Pain Assessment: Faces Faces Pain Scale: Hurts a little bit Pain Location: head Pain Descriptors / Indicators: Headache Pain  Intervention(s): Limited activity within patient's tolerance    Home Living Family/patient expects to be discharged to:: Private residence Living Arrangements: Alone Available Help at Discharge: Other (Comment) (pt reports no assist) Type of Home: House Home Access: Stairs to enter Entrance Stairs-Rails: None Entrance Stairs-Number of Steps: 2   Home Layout: One level Home Equipment: Cane - single Librarian, academic (2 wheels)      Prior Function Prior Level of Function : Independent/Modified Independent;History of Falls (last six months)             Mobility Comments: Pt reports use of SPC, hx of falls (too many to count) ADLs Comments: Independent     Extremity/Trunk Assessment   Upper Extremity Assessment Upper Extremity Assessment: Defer to OT evaluation    Lower Extremity Assessment Lower Extremity Assessment: Generalized weakness (Pt with limited active DF bilaterally 3-/5 at best, hip flexion MMT 3/5, knee ext MMT 3/5)    Cervical / Trunk Assessment Cervical / Trunk Assessment: Kyphotic  Communication   Communication Communication: No apparent difficulties    Cognition Arousal: Alert Behavior During Therapy: WFL for tasks assessed/performed   PT - Cognitive impairments: No apparent impairments         Following commands: Intact       Cueing Cueing Techniques: Verbal cues, Tactile cues, Visual cues     General Comments      Exercises     Assessment/Plan    PT Assessment Patient needs continued PT services;All further PT needs can be met in the next venue of care  PT Problem List Decreased strength;Decreased range of motion;Decreased activity tolerance;Decreased balance;Decreased mobility;Pain;Decreased safety awareness       PT Treatment Interventions DME instruction;Gait training;Balance training;Stair training;Functional mobility training;Therapeutic activities;Therapeutic exercise;Patient/family education    PT Goals (Current goals  can be found in the Care Plan section)  Acute Rehab PT Goals Patient Stated Goal: Return home after rehab stay PT Goal Formulation: With patient Time For Goal Achievement: 02/18/24 Potential to Achieve Goals: Good    Frequency Min 3X/week     Co-evaluation PT/OT/SLP Co-Evaluation/Treatment: Yes Reason for Co-Treatment: To address functional/ADL transfers PT goals addressed during session: Mobility/safety with mobility OT goals addressed during session: ADL's and self-care       AM-PAC PT 6 Clicks Mobility  Outcome Measure Help needed turning from your back to your side while in a flat bed without using bedrails?: A Little Help needed moving from lying on your back to sitting on the side of a flat bed without using bedrails?: A Little Help needed moving to and from a bed to a chair (including a wheelchair)?: A Little Help needed standing up from a chair using your arms (e.g., wheelchair or bedside chair)?: A Little Help needed to walk in hospital room?: A Lot Help needed climbing 3-5 steps with a railing? : A Lot 6 Click Score: 16    End of Session Equipment Utilized During Treatment: Gait belt Activity Tolerance: Patient limited by fatigue;Patient tolerated treatment well Patient left: in chair;with call bell/phone within reach;with chair alarm set   PT Visit Diagnosis: Unsteadiness on feet (R26.81);Other abnormalities of gait and mobility (R26.89);Repeated falls (R29.6);Muscle weakness (generalized) (M62.81);Pain    Time: 1012-1032 PT Time Calculation (min) (ACUTE ONLY): 20 min   Charges:   PT Evaluation $PT Eval Low Complexity: 1 Low PT Treatments $Therapeutic Activity: 8-22  mins PT General Charges $$ ACUTE PT VISIT: 1 Visit       12:44 PM, 02/04/24 Rosaria Settler, PT, DPT [place] with [facility]

## 2024-02-04 NOTE — Plan of Care (Signed)
  Problem: Acute Rehab OT Goals (only OT should resolve) Goal: Pt. Will Perform Grooming Flowsheets (Taken 02/04/2024 1215) Pt Will Perform Grooming: with modified independence Goal: Pt. Will Perform Lower Body Bathing Flowsheets (Taken 02/04/2024 1215) Pt Will Perform Lower Body Bathing: with modified independence Goal: Pt. Will Perform Lower Body Dressing Flowsheets (Taken 02/04/2024 1215) Pt Will Perform Lower Body Dressing: with modified independence Goal: Pt. Will Transfer To Toilet Flowsheets (Taken 02/04/2024 1215) Pt Will Transfer to Toilet: with modified independence Goal: Pt. Will Perform Toileting-Clothing Manipulation Flowsheets (Taken 02/04/2024 1215) Pt Will Perform Toileting - Clothing Manipulation and hygiene: with modified independence Goal: Pt/Caregiver Will Perform Home Exercise Program Flowsheets (Taken 02/04/2024 1215) Pt/caregiver will Perform Home Exercise Program:  Increased strength  Both right and left upper extremity  Independently  Hoover Grewe OT, MOT

## 2024-02-04 NOTE — Discharge Summary (Signed)
 Physician Discharge Summary  Gabriel Koch FMW:[id number] DOB: 05/15/1966 DOA: 02/02/2024  PCP: Bertell Satterfield, MD GI: [facility]   Admit date: 02/02/2024 Discharge date: 02/04/2024  Admitted From:  Home  Disposition: Home   Recommendations for Outpatient Follow-up:  Follow up with PCP in 1 weeks Follow up with [facility] in 2-3 weeks Please obtain CMP/CBC in 1-2 weeks  Discharge Condition: STABLE   CODE STATUS: FULL DIET: regular    Brief Hospitalization Summary: Please see all hospital notes, images, labs for full details of the hospitalization. Admission provider HPI:  58 y.o. male with medical history significant for  alcohol use disorder, history of strokes, COPD, GERD, tobacco abuse, history of subarachnoid hemorrhage and left sylvian Fissure back in April 2025 due to falls while intoxicated ,  , recurrent admissions for [facility] potomania/hyponatremia presents to the ED today again after fall with closed head injury in the setting of ongoing alcohol abuse/acute alcohol intoxication. - CBG 80 - Patient denies significant headaches, no visual disturbance no new focal deficits No fever  Or chills    No Nausea, Vomiting or Diarrhea - In ED CT head without new acute findings Chest x-ray without acute findings -Blood alcohol level is 140 - CBC consistent with pancytopenia with WBC of 3.4 hemoglobin 11.3 which is higher than prior baseline platelets 116 - Creatinine 1.52 baseline 0.6-0.7, AST greater than ALT , elevated alk phos and T. bili noted - Sodium 120 was 135 on 12/06/2023 potassium is down to 3.3 chloride 86 bicarb is 16  Hospital Course by listed problems addressed  status post fall with closed head injury--while intoxicated -CT head without acute changes - Neurochecks as ordered and have been stable  - Patient which was thrombocytopenia with increased risk for bleeding -history of subarachnoid hemorrhage and left sylvian Fissure back in April 2025 due to  falls while intoxicated    Acute hyponatremia--beer potomania/dehydration in setting of diuretic use -No Nausea, Vomiting or Diarrhea - Sodium slowly improving now up to 135 - holding Lasix  until outpatient follow up for recheck labs, etc. - He was treated with IV fluid and his sodium corrected   pancytopenia--multifactorial suspect some component of bone marrow suppression from toxic effects of alcohol, hepatic dysfunction from ongoing alcohol use- -WBC of 3.4 hemoglobin 11.3 which is higher than prior baseline platelets 116 -- Follow up with his hematologist outpatient at [facility]  alcohol abuse--- high risk for DT -Blood alcohol level is 140 - Lorazepam   per CIWA protocol - Thiamine  and folic acid  as ordered - added librium  taper to try to avoid acute alcohol withdrawal  - no acute DTs    Tobacco abuse/COPD--smoking cessation advised, nicotine  patch advised - As needed bronchodilators as ordered   H/o prior ischemic CVA---chronic infarct within the right thalamus.  -- CT head on admission today without new acute findings -- Not on antiplatelet agent PTA presumably due to chronic thrombocytopenia and recent subarachnoid hemorrhage   Discharge Diagnoses:  Principal Problem:   Hyponatremia   Discharge Instructions:  Allergies as of 02/04/2024       Reactions   Bee Venom Anaphylaxis        Medication List     STOP taking these medications    furosemide  20 MG tablet Commonly known as: LASIX    losartan  50 MG tablet Commonly known as: COZAAR    nicotine  14 mg/24hr patch Commonly known as: NICODERM CQ  - dosed in mg/24 hours       TAKE  these medications    acetaminophen  500 MG tablet Commonly known as: TYLENOL  Take 1,000 mg by mouth every 6 (six) hours as needed for mild pain (pain score 1-3).   albuterol  108 (90 Base) MCG/ACT inhaler Commonly known as: VENTOLIN  HFA Inhale 1-2 puffs into the lungs every 6 (six) hours as needed for wheezing or  shortness of breath.   amLODipine  5 MG tablet Commonly known as: NORVASC  Take 5 mg by mouth daily.   atorvastatin  40 MG tablet Commonly known as: LIPITOR Take 40 mg by mouth daily.   buPROPion  75 MG tablet Commonly known as: WELLBUTRIN  Take 150 mg by mouth 2 (two) times daily.   busPIRone  10 MG tablet Commonly known as: BUSPAR  Take 10 mg by mouth 2 (two) times daily.   esomeprazole  40 MG capsule Commonly known as: NEXIUM  Take 1 capsule (40 mg total) by mouth 2 (two) times daily before a meal.   folic acid  1 MG tablet Commonly known as: FOLVITE  Take 1 mg by mouth daily.   gabapentin  100 MG capsule Commonly known as: NEURONTIN  Take 100 mg by mouth 3 (three) times daily.   meloxicam 7.5 MG tablet Commonly known as: MOBIC Take 1 tablet (7.5 mg total) by mouth daily as needed for pain. What changed:  when to take this reasons to take this   multivitamin with minerals Tabs tablet Take 1 tablet by mouth daily. Start taking on: February 05, 2024   thiamine  100 MG tablet Commonly known as: Vitamin B-1 Take 1 tablet (100 mg total) by mouth daily. Start taking on: February 05, 2024   tiotropium 18 MCG inhalation capsule Commonly known as: SPIRIVA  Place 18 mcg into inhaler and inhale daily.   traZODone  100 MG tablet Commonly known as: DESYREL  Take 100 mg by mouth at bedtime.   zolpidem 10 MG tablet Commonly known as: AMBIEN Take 10 mg by mouth at bedtime.        Follow-up Information     Bertell Satterfield, MD. Schedule an appointment as soon as possible for a visit in 1 week(s).   Specialty: Internal Medicine Why: Hospital Follow Up Contact information: [address] [place] [address] [phone]         Rudy Josette RAMAN, [place]. Schedule an appointment as soon as possible for a visit in 2 week(s).   Specialty: Gastroenterology Why: Hospital Follow Up Contact information: [address] [place] [address] [phone]                 Allergies  Allergen Reactions   Bee Venom Anaphylaxis   Allergies as of 02/04/2024       Reactions   Bee Venom Anaphylaxis        Medication List     STOP taking these medications    furosemide  20 MG tablet Commonly known as: LASIX    losartan  50 MG tablet Commonly known as: COZAAR    nicotine  14 mg/24hr patch Commonly known as: NICODERM CQ  - dosed in mg/24 hours       TAKE these medications    acetaminophen  500 MG tablet Commonly known as: TYLENOL  Take 1,000 mg by mouth every 6 (six) hours as needed for mild pain (pain score 1-3).   albuterol  108 (90 Base) MCG/ACT inhaler Commonly known as: VENTOLIN  HFA Inhale 1-2 puffs into the lungs every 6 (six) hours as needed for wheezing or shortness of breath.   amLODipine  5 MG tablet Commonly known as: NORVASC  Take 5 mg by mouth daily.  atorvastatin  40 MG tablet Commonly known as: LIPITOR Take 40 mg by mouth daily.   buPROPion  75 MG tablet Commonly known as: WELLBUTRIN  Take 150 mg by mouth 2 (two) times daily.   busPIRone  10 MG tablet Commonly known as: BUSPAR  Take 10 mg by mouth 2 (two) times daily.   esomeprazole  40 MG capsule Commonly known as: NEXIUM  Take 1 capsule (40 mg total) by mouth 2 (two) times daily before a meal.   folic acid  1 MG tablet Commonly known as: FOLVITE  Take 1 mg by mouth daily.   gabapentin  100 MG capsule Commonly known as: NEURONTIN  Take 100 mg by mouth 3 (three) times daily.   meloxicam 7.5 MG tablet Commonly known as: MOBIC Take 1 tablet (7.5 mg total) by mouth daily as needed for pain. What changed:  when to take this reasons to take this   multivitamin with minerals Tabs tablet Take 1 tablet by mouth daily. Start taking on: February 05, 2024   thiamine  100 MG tablet Commonly known as: Vitamin B-1 Take 1 tablet (100 mg total) by mouth daily. Start taking on: February 05, 2024   tiotropium 18 MCG inhalation capsule Commonly known as: SPIRIVA  Place 18 mcg  into inhaler and inhale daily.   traZODone  100 MG tablet Commonly known as: DESYREL  Take 100 mg by mouth at bedtime.   zolpidem 10 MG tablet Commonly known as: AMBIEN Take 10 mg by mouth at bedtime.        Procedures/Studies: CT Head Wo Contrast Result Date: 02/02/2024 CLINICAL DATA:  Provided history: Head trauma, abnormal mental status. EXAM: CT HEAD WITHOUT CONTRAST TECHNIQUE: Contiguous axial images were obtained from the base of the skull through the vertex without intravenous contrast. RADIATION DOSE REDUCTION: This exam was performed according to the departmental dose-optimization program which includes automated exposure control, adjustment of the mA and/or kV according to patient size and/or use of iterative reconstruction technique. COMPARISON:  Prior head CT examinations 11/02/2023 and earlier. FINDINGS: Brain: Age advanced generalized cerebral atrophy. Patchy and ill-defined hypoattenuation within the cerebral white matter, nonspecific but compatible with mild chronic small vessel ischemic disease. Known chronic infarct within the right thalamus. There is no acute intracranial hemorrhage. No demarcated cortical infarct. No extra-axial fluid collection. No evidence of an intracranial mass. No midline shift. Vascular: No hyperdense vessel. Atherosclerotic calcifications. Skull: No calvarial fracture or aggressive osseous lesion. Sinuses/Orbits: No mass or acute finding within the imaged orbits. Minimal mucosal thickening within the left maxillary sinus at the imaged levels. IMPRESSION: 1. No evidence of an acute intracranial abnormality. 2. Mild chronic small vessel ischemic changes within the cerebral white matter. 3. Known chronic infarct within the right thalamus. 4. Age-advanced generalized cerebral atrophy. 5. Minor mucosal thickening within the left maxillary sinus at the imaged levels. Electronically Signed   By: Rockey Childs D.O.   On: 02/02/2024 16:47   DG Chest Port 1  View Result Date: 02/02/2024 CLINICAL DATA:  Weakness.  History of recent falls. EXAM: PORTABLE CHEST 1 VIEW COMPARISON:  CT chest, abdomen, and pelvis dated 11/02/2023. FINDINGS: The heart size and mediastinal contours are within normal limits. Aortic atherosclerosis. No focal consolidation, pleural effusion, or pneumothorax. Remote left-sided rib fractures. No acute osseous abnormality. IMPRESSION: No acute findings in the chest. Electronically Signed   By: Harrietta Sherry M.D.   On: 02/02/2024 15:32     Subjective: Pt tolerating diet well, mentation is back to normal baseline.   Discharge Exam: Vitals:   02/04/24 9444 02/04/24 0827  BP: 138/76   Pulse: 85   Resp: 18   Temp: 97.6 F (36.4 C)   SpO2: 100% 97%   Vitals:   02/04/24 0435 02/04/24 0535 02/04/24 0555 02/04/24 0827  BP:  (!) 131/105 138/76   Pulse:   85   Resp:  19 18   Temp: 97.8 F (36.6 C)  97.6 F (36.4 C)   TempSrc: Oral  Oral   SpO2:  100% 100% 97%  Weight:      Height:       General: Pt is alert, awake, not in acute distress Cardiovascular: normal S1/S2 +, no rubs, no gallops Respiratory: CTA bilaterally, no wheezing, no rhonchi Abdominal: Soft, NT, ND, bowel sounds + Extremities: no edema, no cyanosis   The results of significant diagnostics from this hospitalization (including imaging, microbiology, ancillary and laboratory) are listed below for reference.     Microbiology: Recent Results (from the past 240 hours)  MRSA Next Gen by PCR, Nasal     Status: None   Collection Time: 02/02/24  7:50 PM   Specimen: Nasal Mucosa; Nasal Swab  Result Value Ref Range Status   MRSA by PCR Next Gen NOT DETECTED NOT DETECTED Final    Comment: (NOTE) The GeneXpert MRSA Assay (FDA approved for NASAL specimens only), is one component of a comprehensive MRSA colonization surveillance program. It is not intended to diagnose MRSA infection nor to guide or monitor treatment for MRSA infections. Test performance is  not FDA approved in patients less than 76 years old. Performed at [facility], [address], [place], [place] [address]      Labs: BNP (last 3 results) No results for input(s): BNP in the last 8760 hours. Basic Metabolic Panel: Recent Labs  Lab 02/02/24 1501 02/02/24 2134 02/03/24 0332 02/03/24 0746 02/04/24 0408  NA 120* 124* 128* 131* 135  K 3.3*  --   --  3.5 3.2*  CL 86*  --   --  102 107  CO2 16*  --   --  19* 20*  GLUCOSE 77  --   --  103* 94  BUN 17  --   --  12 8  CREATININE 1.52*  --   --  0.73 0.58*  CALCIUM  9.6  --   --  8.4* 8.1*   Liver Function Tests: Recent Labs  Lab 02/02/24 1501 02/03/24 0746 02/04/24 0408  AST 55* 47* 36  ALT 28 22 18   ALKPHOS 170* 151* 132*  BILITOT 1.3* 1.4* 0.6  PROT 8.9* 6.6 6.0*  ALBUMIN 4.3 3.1* 2.7*   No results for input(s): LIPASE, AMYLASE in the last 168 hours. No results for input(s): AMMONIA in the last 168 hours. CBC: Recent Labs  Lab 02/02/24 1501 02/03/24 0746 02/04/24 0408  WBC 3.4* 1.5* 2.0*  HGB 11.3* 9.0* 8.4*  HCT 30.9* 24.8* 24.0*  MCV 104.7* 106.0* 108.6*  PLT 116* 95* 76*   Cardiac Enzymes: No results for input(s): CKTOTAL, CKMB, CKMBINDEX, TROPONINI in the last 168 hours. BNP: Invalid input(s): POCBNP CBG: No results for input(s): GLUCAP in the last 168 hours. D-Dimer No results for input(s): DDIMER in the last 72 hours. Hgb A1c No results for input(s): HGBA1C in the last 72 hours. Lipid Profile No results for input(s): CHOL, HDL, LDLCALC, TRIG, CHOLHDL, LDLDIRECT in the last 72 hours. Thyroid  function studies No results for input(s): TSH, T4TOTAL, T3FREE, THYROIDAB in the last 72 hours.  Invalid input(s): FREET3 Anemia work up No results for input(s): VITAMINB12, FOLATE,  FERRITIN, TIBC, IRON, RETICCTPCT in the last 72 hours. Urinalysis    Component Value Date/Time   COLORURINE STRAW (A) 12/03/2023 1720   APPEARANCEUR CLEAR  12/03/2023 1720   APPEARANCEUR Clear 03/09/2013 2142   LABSPEC 1.003 (L) 12/03/2023 1720   LABSPEC 1.002 03/09/2013 2142   PHURINE 5.0 12/03/2023 1720   GLUCOSEU NEGATIVE 12/03/2023 1720   GLUCOSEU Negative 03/09/2013 2142   HGBUR SMALL (A) 12/03/2023 1720   BILIRUBINUR NEGATIVE 12/03/2023 1720   BILIRUBINUR Negative 03/09/2013 2142   KETONESUR NEGATIVE 12/03/2023 1720   PROTEINUR NEGATIVE 12/03/2023 1720   NITRITE NEGATIVE 12/03/2023 1720   LEUKOCYTESUR NEGATIVE 12/03/2023 1720   LEUKOCYTESUR Negative 03/09/2013 2142   Sepsis Labs Recent Labs  Lab 02/02/24 1501 02/03/24 0746 02/04/24 0408  WBC 3.4* 1.5* 2.0*   Microbiology Recent Results (from the past 240 hours)  MRSA Next Gen by PCR, Nasal     Status: None   Collection Time: 02/02/24  7:50 PM   Specimen: Nasal Mucosa; Nasal Swab  Result Value Ref Range Status   MRSA by PCR Next Gen NOT DETECTED NOT DETECTED Final    Comment: (NOTE) The GeneXpert MRSA Assay (FDA approved for NASAL specimens only), is one component of a comprehensive MRSA colonization surveillance program. It is not intended to diagnose MRSA infection nor to guide or monitor treatment for MRSA infections. Test performance is not FDA approved in patients less than 80 years old. Performed at [facility], [address], [place], [place] [address]     Time coordinating discharge:  37 mins   SIGNED:  Afton Louder, MD  [facility] 02/04/2024, 11:39 AM How to contact the [facility] Attending or Consulting provider 7A - 7P or covering provider during after hours 7P -7A, for this patient?  Check the care team in [facility] [address] and look for a) attending/consulting [facility] provider listed and b) the [facility] team listed Log into [email or web address] and use [place]'s universal password to access. If you do not have the password, please contact the hospital operator. Locate the [facility] provider you are looking for under [facility] and page to a number that you can be directly  reached. If you still have difficulty reaching the provider, please page the [facility] (Director on Call) for the Hospitalists listed on amion for assistance.

## 2024-02-08 ENCOUNTER — Telehealth: Payer: Self-pay | Admitting: Gastroenterology

## 2024-02-08 NOTE — Telephone Encounter (Signed)
 Patient left a message that he recently got out of the hospital and on his papers said he should follow up with [facility] ASAP.  There are no openings.  Please advise.

## 2024-02-09 NOTE — Telephone Encounter (Signed)
 He should be scheduled with Dr. Cindie or available APP. Would be reasonable to see if Dr. Cindie would be willing to see him in a new patient spot.

## 2024-02-10 NOTE — Telephone Encounter (Signed)
 I agree, this concern about the skin on his hands needs to be addressed with his primary care provider or he can be seen at urgent care or back in the ER if he can't wait until tomorrow.

## 2024-02-10 NOTE — Telephone Encounter (Signed)
 Noted

## 2024-02-10 NOTE — Telephone Encounter (Signed)
 Spoke to pt, he informed me that the skin was coming off of both of his hands, where he had his IV's. I informed him that he may be having a allergic reaction to the tape. He stated that he had a OV with his PCP tomorrow morning. I informed him that if it got worse to call the ambulance. He voiced understanding.
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
## 2024-02-14 ENCOUNTER — Encounter (HOSPITAL_COMMUNITY): Payer: Self-pay | Admitting: Emergency Medicine

## 2024-02-14 ENCOUNTER — Other Ambulatory Visit: Payer: Self-pay

## 2024-02-14 ENCOUNTER — Encounter: Payer: Self-pay | Admitting: Gastroenterology

## 2024-02-14 ENCOUNTER — Ambulatory Visit (INDEPENDENT_AMBULATORY_CARE_PROVIDER_SITE_OTHER): Payer: MEDICAID | Admitting: Gastroenterology

## 2024-02-14 ENCOUNTER — Emergency Department (HOSPITAL_COMMUNITY)
Admission: EM | Admit: 2024-02-14 | Discharge: 2024-02-15 | Disposition: A | Discharge status: Home / Self Care | Payer: MEDICAID | Attending: Emergency Medicine | Admitting: Emergency Medicine

## 2024-02-14 VITALS — BP 117/69 | HR 92 | Temp 97.6°F | Ht 67.0 in | Wt 126.8 lb

## 2024-02-14 DIAGNOSIS — L03115 Cellulitis of right lower limb: Secondary | ICD-10-CM | POA: Insufficient documentation

## 2024-02-14 DIAGNOSIS — R000 Tachycardia, unspecified: Secondary | ICD-10-CM | POA: Diagnosis not present

## 2024-02-14 DIAGNOSIS — L03116 Cellulitis of left lower limb: Secondary | ICD-10-CM | POA: Diagnosis not present

## 2024-02-14 DIAGNOSIS — K625 Hemorrhage of anus and rectum: Secondary | ICD-10-CM

## 2024-02-14 DIAGNOSIS — K2270 Barrett's esophagus without dysplasia: Secondary | ICD-10-CM

## 2024-02-14 DIAGNOSIS — R600 Localized edema: Secondary | ICD-10-CM | POA: Diagnosis not present

## 2024-02-14 DIAGNOSIS — K2100 Gastro-esophageal reflux disease with esophagitis, without bleeding: Secondary | ICD-10-CM

## 2024-02-14 DIAGNOSIS — R112 Nausea with vomiting, unspecified: Secondary | ICD-10-CM

## 2024-02-14 DIAGNOSIS — L03119 Cellulitis of unspecified part of limb: Secondary | ICD-10-CM

## 2024-02-14 DIAGNOSIS — K529 Noninfective gastroenteritis and colitis, unspecified: Secondary | ICD-10-CM

## 2024-02-14 DIAGNOSIS — R748 Abnormal levels of other serum enzymes: Secondary | ICD-10-CM

## 2024-02-14 DIAGNOSIS — F1010 Alcohol abuse, uncomplicated: Secondary | ICD-10-CM

## 2024-02-14 DIAGNOSIS — R1011 Right upper quadrant pain: Secondary | ICD-10-CM | POA: Diagnosis not present

## 2024-02-14 DIAGNOSIS — D61818 Other pancytopenia: Secondary | ICD-10-CM

## 2024-02-14 DIAGNOSIS — Z86018 Personal history of other benign neoplasm: Secondary | ICD-10-CM

## 2024-02-14 DIAGNOSIS — R197 Diarrhea, unspecified: Secondary | ICD-10-CM

## 2024-02-14 DIAGNOSIS — M7989 Other specified soft tissue disorders: Secondary | ICD-10-CM | POA: Diagnosis present

## 2024-02-14 NOTE — ED Triage Notes (Signed)
 Pt c/o bilateral lower leg/feet swelling and lower back pain. Pt states he seen pcp for the same 2 days ago.

## 2024-02-15 ENCOUNTER — Emergency Department (HOSPITAL_COMMUNITY): Payer: MEDICAID

## 2024-02-15 ENCOUNTER — Telehealth: Payer: Self-pay | Admitting: Gastroenterology

## 2024-02-15 LAB — COMPREHENSIVE METABOLIC PANEL WITH GFR
ALT: 76 U/L — ABNORMAL HIGH (ref 0–44)
AST: 180 U/L — ABNORMAL HIGH (ref 15–41)
Albumin: 3.4 g/dL — ABNORMAL LOW (ref 3.5–5.0)
Alkaline Phosphatase: 221 U/L — ABNORMAL HIGH (ref 38–126)
Anion gap: 11 (ref 5–15)
BUN: 9 mg/dL (ref 6–20)
CO2: 19 mmol/L — ABNORMAL LOW (ref 22–32)
Calcium: 8.8 mg/dL — ABNORMAL LOW (ref 8.9–10.3)
Chloride: 104 mmol/L (ref 98–111)
Creatinine, Ser: 0.74 mg/dL (ref 0.61–1.24)
GFR, Estimated: >60 mL/min (ref >60)
Glucose, Bld: 91 mg/dL (ref 70–99)
Potassium: 4.4 mmol/L (ref 3.5–5.1)
Sodium: 134 mmol/L — ABNORMAL LOW (ref 135–145)
Total Bilirubin: 0.6 mg/dL (ref 0.0–1.2)
Total Protein: 7.2 g/dL (ref 6.5–8.1)

## 2024-02-15 LAB — CBC WITH DIFFERENTIAL/PLATELET
Abs Immature Granulocytes: 0.01 K/uL (ref 0.00–0.07)
Basophils Absolute: 0 K/uL (ref 0.0–0.1)
Basophils Relative: 1 %
Eosinophils Absolute: 0 K/uL (ref 0.0–0.5)
Eosinophils Relative: 1 %
HCT: 26.8 % — ABNORMAL LOW (ref 39.0–52.0)
Hemoglobin: 9.5 g/dL — ABNORMAL LOW (ref 13.0–17.0)
Immature Granulocytes: 0 %
Lymphocytes Relative: 27 %
Lymphs Abs: 0.9 K/uL (ref 0.7–4.0)
MCH: 39.1 pg — ABNORMAL HIGH (ref 26.0–34.0)
MCHC: 35.4 g/dL (ref 30.0–36.0)
MCV: 110.3 fL — ABNORMAL HIGH (ref 80.0–100.0)
Monocytes Absolute: 0.4 K/uL (ref 0.1–1.0)
Monocytes Relative: 11 %
Neutro Abs: 2 K/uL (ref 1.7–7.7)
Neutrophils Relative %: 60 %
Platelets: 168 K/uL (ref 150–400)
RBC: 2.43 MIL/uL — ABNORMAL LOW (ref 4.22–5.81)
RDW: 18.2 % — ABNORMAL HIGH (ref 11.5–15.5)
WBC: 3.3 K/uL — ABNORMAL LOW (ref 4.0–10.5)
nRBC: 0 % (ref 0.0–0.2)

## 2024-02-15 LAB — PROTIME-INR
INR: 1 (ref 0.8–1.2)
Prothrombin Time: 13.5 s (ref 11.4–15.2)

## 2024-02-15 LAB — BRAIN NATRIURETIC PEPTIDE: B Natriuretic Peptide: 54 pg/mL (ref 0.0–100.0)

## 2024-02-15 LAB — LACTIC ACID, PLASMA: Lactic Acid, Venous: 1.6 mmol/L (ref 0.5–1.9)

## 2024-02-15 MED ORDER — FUROSEMIDE 20 MG PO TABS: 20.0000 mg | ORAL_TABLET | Freq: Every day | ORAL | 0 refills | Status: DC

## 2024-02-15 MED ORDER — LIDOCAINE HCL (PF) 1 % IJ SOLN
INTRAMUSCULAR | Status: AC
  Administered 2024-02-15 (×2): 5 mL
  Filled 2024-02-15: qty 5

## 2024-02-15 MED ORDER — CEFTRIAXONE SODIUM 1 G IJ SOLR
1.0000 g | Freq: Once | INTRAMUSCULAR | Status: AC
  Administered 2024-02-15 (×2): 1 g via INTRAMUSCULAR
  Filled 2024-02-15: qty 10

## 2024-02-15 MED ORDER — SODIUM CHLORIDE 0.9 % IV SOLN: 2.0000 g | Freq: Once | INTRAVENOUS | Status: DC

## 2024-02-15 MED ORDER — OXYCODONE-ACETAMINOPHEN 5-325 MG PO TABS
1.0000 | ORAL_TABLET | Freq: Once | ORAL | Status: AC
  Administered 2024-02-15 (×2): 1 via ORAL
  Filled 2024-02-15: qty 1

## 2024-02-15 MED ORDER — CEPHALEXIN 500 MG PO CAPS: 500.0000 mg | ORAL_CAPSULE | Freq: Four times a day (QID) | ORAL | 0 refills | Status: DC

## 2024-02-15 NOTE — Telephone Encounter (Signed)
 Patient seen in ED yesterday for LE edema. Labs and cxr as outlined. Started on short course of lasix  20mg  daily.  BNP 54 INR 1 WBC 3.3 Hgb 9.5 (up from 8.4) Hct 26.8 MCV 110.3 Platelets 168 (up from 76) Na 134 K 4.4 Cre 0.74,  Alb 3.4, Tbili 0.6 AP 221, AST 180 ALT 76 Lactic acid 1.6  Cxr with no active disease.   -->Discussed with Dr. Cindie. Please schedule.  EGD with possible small bowel capsule study Dx ruq pain, gerd, gi bleeding, anemia, n/v, h/o duodenal adenoma Hold off on colonoscopy at this time.  ASA 3.

## 2024-02-15 NOTE — ED Notes (Signed)
 Pt requesting help with ride home.

## 2024-02-15 NOTE — ED Provider Notes (Signed)
 [place] EMERGENCY DEPARTMENT AT [facility] Provider Note   CSN: [id number] Arrival date & time: 02/14/24  2238     Patient presents with: Leg Swelling   Gabriel Koch is a 58 y.o. male.   Presents to the emergency department for evaluation of bilateral lower leg and foot pain and swelling.  Patient denies chronic swelling.  He does not take diuretics.  He denies injury.       Prior to Admission medications   Medication Sig Start Date End Date Taking? Authorizing Provider  cephALEXin  (KEFLEX ) 500 MG capsule Take 1 capsule (500 mg total) by mouth 4 (four) times daily. 02/15/24  Yes Hajime Asfaw, Lonni PARAS, MD  furosemide  (LASIX ) 20 MG tablet Take 1 tablet (20 mg total) by mouth daily. 02/15/24  Yes Noell Lorensen, Lonni PARAS, MD  acetaminophen  (TYLENOL ) 500 MG tablet Take 1,000 mg by mouth every 6 (six) hours as needed for mild pain (pain score 1-3).    [provider]  albuterol  (VENTOLIN  HFA) 108 (90 Base) MCG/ACT inhaler Inhale 1-2 puffs into the lungs every 6 (six) hours as needed for wheezing or shortness of breath.    [provider]  amLODipine  (NORVASC ) 5 MG tablet Take 5 mg by mouth daily.    [provider]  atorvastatin  (LIPITOR) 40 MG tablet Take 40 mg by mouth daily.    [provider]  buPROPion  (WELLBUTRIN ) 75 MG tablet Take 150 mg by mouth 2 (two) times daily.    [provider]  busPIRone  (BUSPAR ) 10 MG tablet Take 10 mg by mouth 2 (two) times daily.    [provider]  esomeprazole  (NEXIUM ) 40 MG capsule Take 1 capsule (40 mg total) by mouth 2 (two) times daily before a meal. 11/15/23   Rudy Josette RAMAN, PA-C  folic acid  (FOLVITE ) 1 MG tablet Take 1 mg by mouth daily.    [provider]  gabapentin  (NEURONTIN ) 100 MG capsule Take 100 mg by mouth 3 (three) times daily.    [provider]  meloxicam  (MOBIC ) 7.5 MG tablet Take 1 tablet (7.5 mg total) by mouth daily as needed for pain. 02/04/24    Vicci Afton CROME, MD  Multiple Vitamin (MULTIVITAMIN WITH MINERALS) TABS tablet Take 1 tablet by mouth daily. 02/05/24   Johnson, Clanford L, MD  thiamine  (VITAMIN B-1) 100 MG tablet Take 1 tablet (100 mg total) by mouth daily. 02/05/24   Johnson, Clanford L, MD  tiotropium (SPIRIVA ) 18 MCG inhalation capsule Place 18 mcg into inhaler and inhale daily.    [provider]  traZODone  (DESYREL ) 100 MG tablet Take 100 mg by mouth at bedtime.    [provider]  zolpidem (AMBIEN) 10 MG tablet Take 10 mg by mouth at bedtime.    [provider]    Allergies: Bee venom    Review of Systems  Updated Vital Signs BP (!) 142/79   Pulse (!) 104   Temp 98.2 F (36.8 C) (Oral)   Resp 18   Ht 5' 7 (1.702 m)   Wt 57 kg   SpO2 100%   BMI 19.68 kg/m   Physical Exam Vitals and nursing note reviewed.  Constitutional:      General: He is not in acute distress.    Appearance: He is well-developed.  HENT:     Head: Normocephalic and atraumatic.     Mouth/Throat:     Mouth: Mucous membranes are moist.  Eyes:     General: Vision grossly intact.  Gaze aligned appropriately.     Extraocular Movements: Extraocular movements intact.     Conjunctiva/sclera: Conjunctivae normal.  Cardiovascular:     Rate and Rhythm: Regular rhythm. Tachycardia present.     Pulses: Normal pulses.     Heart sounds: Normal heart sounds, S1 normal and S2 normal. No murmur heard.    No friction rub. No gallop.  Pulmonary:     Effort: Pulmonary effort is normal. No respiratory distress.     Breath sounds: Normal breath sounds.  Abdominal:     Palpations: Abdomen is soft.     Tenderness: There is no abdominal tenderness. There is no guarding or rebound.     Hernia: No hernia is present.  Musculoskeletal:        General: No swelling.     Cervical back: Full passive range of motion without pain, normal range of motion and neck supple. No pain with movement, spinous process tenderness or muscular  tenderness. Normal range of motion.     Right lower leg: Edema present.     Left lower leg: Edema present.  Skin:    General: Skin is warm and dry.     Capillary Refill: Capillary refill takes less than 2 seconds.     Findings: Erythema (Bilateral lower extremities) present. No ecchymosis, lesion or wound.  Neurological:     Mental Status: He is alert and oriented to person, place, and time.     GCS: GCS eye subscore is 4. GCS verbal subscore is 5. GCS motor subscore is 6.     Cranial Nerves: Cranial nerves 2-12 are intact.     Sensory: Sensation is intact.     Motor: Motor function is intact. No weakness or abnormal muscle tone.     Coordination: Coordination is intact.  Psychiatric:        Mood and Affect: Mood normal.        Speech: Speech normal.        Behavior: Behavior normal.     (all labs ordered are listed, but only abnormal results are displayed) Labs Reviewed  COMPREHENSIVE METABOLIC PANEL WITH GFR - Abnormal; Notable for the following components:      Result Value   Sodium 134 (*)    CO2 19 (*)    Calcium  8.8 (*)    Albumin 3.4 (*)    AST 180 (*)    ALT 76 (*)    Alkaline Phosphatase 221 (*)    All other components within normal limits  CBC WITH DIFFERENTIAL/PLATELET - Abnormal; Notable for the following components:   WBC 3.3 (*)    RBC 2.43 (*)    Hemoglobin 9.5 (*)    HCT 26.8 (*)    MCV 110.3 (*)    MCH 39.1 (*)    RDW 18.2 (*)    All other components within normal limits  CULTURE, BLOOD (ROUTINE X 2)  CULTURE, BLOOD (ROUTINE X 2)  LACTIC ACID, PLASMA  PROTIME-INR  BRAIN NATRIURETIC PEPTIDE  LACTIC ACID, PLASMA  URINALYSIS, W/ REFLEX TO CULTURE (INFECTION SUSPECTED)    EKG: EKG Interpretation Date/Time:  Tuesday February 15 2024 01:59:09 EDT Ventricular Rate:  101 PR Interval:  194 QRS Duration:  117 QT Interval:  350 QTC Calculation: 454 R Axis:   88  Text Interpretation: Sinus tachycardia Incomplete right bundle branch block ST elevation,  consider inferior injury Confirmed by Haze Lonni PARAS [phone]) on 02/15/2024 2:35:16 AM  Radiology: No results found.   Procedures   Medications Ordered in  the ED  cefTRIAXone  (ROCEPHIN ) 2 g in sodium chloride  0.9 % 100 mL IVPB (has no administration in time range)  oxyCODONE -acetaminophen  (PERCOCET/ROXICET) 5-325 MG per tablet 1 tablet (has no administration in time range)                                    Medical Decision Making Amount and/or Complexity of Data Reviewed Labs: ordered. Radiology: ordered.  Risk Prescription drug management.   Differential Diagnosis considered includes, but not limited to: CHF; venous stasis; DVT; renal failure; nephrotic syndrome; anasarca; cellulitis   Presents with pain and swelling of both of his lower legs and feet.  Examination reveals some erythema and increased warmth, but symmetric.  He has palpable pulses in both feet.  No concern for ischemic limb.  Patient does not have any signs of congestive heart failure on his exam.  No signs of sepsis.  Blood cell count is low normal.  Kidney function is normal.  Patient with lower extremity swelling of unknown etiology.  Will treat with several days of diuretic.  Will empirically cover for cellulitis.  Patient appears well, does not require hospitalization at this time.  LFTs abnormal.  Patient with slight tachycardia.  These findings secondary to his known chronic alcoholism.     Final diagnoses:  Leg edema  Cellulitis of lower extremity, unspecified laterality    ED Discharge Orders          Ordered    furosemide  (LASIX ) 20 MG tablet  Daily        02/15/24 0239    cephALEXin  (KEFLEX ) 500 MG capsule  4 times daily        02/15/24 0239               Haze Lonni PARAS, MD 02/15/24 [phone]

## 2024-02-16 ENCOUNTER — Encounter: Payer: Self-pay | Admitting: *Deleted

## 2024-02-16 NOTE — Telephone Encounter (Signed)
 Noted! Thank you

## 2024-02-16 NOTE — Telephone Encounter (Signed)
 Pt has been scheduled for 02/28/24. Instructions sent via mychart. Pt has been added to schedule for 03/01/24 for givens

## 2024-02-16 NOTE — Telephone Encounter (Signed)
 [facility]. PA: Your authorization #[id number] was submitted. Pending

## 2024-02-16 NOTE — Telephone Encounter (Signed)
 noted

## 2024-02-20 LAB — CULTURE, BLOOD (ROUTINE X 2)
Culture: NO GROWTH
Culture: NO GROWTH
Special Requests: ADEQUATE
Special Requests: ADEQUATE

## 2024-02-23 ENCOUNTER — Encounter (HOSPITAL_COMMUNITY)
Admission: RE | Admit: 2024-02-23 | Discharge: 2024-02-23 | Disposition: A | Discharge status: Home / Self Care | Payer: MEDICAID | Source: Ambulatory Visit | Attending: Internal Medicine | Admitting: Internal Medicine

## 2024-02-24 NOTE — Telephone Encounter (Signed)
 [facility] denied capsule endoscopy for pt.  Denial letter placed on providers desk.

## 2024-02-25 ENCOUNTER — Encounter: Payer: Self-pay | Admitting: Radiology

## 2024-02-25 NOTE — Telephone Encounter (Signed)
 Message sent to endo to cancel capsule endoscopy.

## 2024-02-25 NOTE — Telephone Encounter (Signed)
 Tammy please proceed with egd only.  Capsule was denied because the indication was not clear. They denied because they thought we were using capsule for gi bleeding and noted that he has had documented cause of bleeding on colonoscopies. We were going to use to rule out small bowel findings to suggest crohn's as he has had recurrent friability etc of the colon without clear cause on bx. He has had TI bx in 03/2023 which were normal.  Await egd findings. If we need to circle back around for a capsule we can submit with updated indications and he can just swallow the capsule.  Denial needs to be uploaded into medial.  FYI, Dr. Cindie.

## 2024-02-28 ENCOUNTER — Encounter (HOSPITAL_COMMUNITY): Payer: Self-pay | Admitting: Internal Medicine

## 2024-02-28 ENCOUNTER — Other Ambulatory Visit: Payer: Self-pay

## 2024-02-28 ENCOUNTER — Ambulatory Visit (HOSPITAL_COMMUNITY): Payer: MEDICAID | Admitting: Anesthesiology

## 2024-02-28 ENCOUNTER — Encounter (HOSPITAL_COMMUNITY)
Admission: RE | Disposition: A | Discharge status: Home / Self Care | Payer: Self-pay | Source: Home / Self Care | Attending: Internal Medicine

## 2024-02-28 ENCOUNTER — Ambulatory Visit (HOSPITAL_COMMUNITY)
Admission: RE | Admit: 2024-02-28 | Discharge: 2024-02-28 | Disposition: A | Discharge status: Home / Self Care | Payer: MEDICAID | Attending: Internal Medicine | Admitting: Internal Medicine

## 2024-02-28 DIAGNOSIS — Z8673 Personal history of transient ischemic attack (TIA), and cerebral infarction without residual deficits: Secondary | ICD-10-CM | POA: Insufficient documentation

## 2024-02-28 DIAGNOSIS — Z8719 Personal history of other diseases of the digestive system: Secondary | ICD-10-CM | POA: Diagnosis not present

## 2024-02-28 DIAGNOSIS — K760 Fatty (change of) liver, not elsewhere classified: Secondary | ICD-10-CM | POA: Insufficient documentation

## 2024-02-28 DIAGNOSIS — I2510 Atherosclerotic heart disease of native coronary artery without angina pectoris: Secondary | ICD-10-CM | POA: Insufficient documentation

## 2024-02-28 DIAGNOSIS — I10 Essential (primary) hypertension: Secondary | ICD-10-CM | POA: Diagnosis not present

## 2024-02-28 DIAGNOSIS — R1011 Right upper quadrant pain: Secondary | ICD-10-CM | POA: Insufficient documentation

## 2024-02-28 DIAGNOSIS — K529 Noninfective gastroenteritis and colitis, unspecified: Secondary | ICD-10-CM | POA: Diagnosis not present

## 2024-02-28 DIAGNOSIS — K625 Hemorrhage of anus and rectum: Secondary | ICD-10-CM | POA: Diagnosis not present

## 2024-02-28 DIAGNOSIS — Z538 Procedure and treatment not carried out for other reasons: Secondary | ICD-10-CM | POA: Diagnosis not present

## 2024-02-28 DIAGNOSIS — F17210 Nicotine dependence, cigarettes, uncomplicated: Secondary | ICD-10-CM | POA: Insufficient documentation

## 2024-02-28 DIAGNOSIS — R112 Nausea with vomiting, unspecified: Secondary | ICD-10-CM | POA: Diagnosis not present

## 2024-02-28 DIAGNOSIS — Z79899 Other long term (current) drug therapy: Secondary | ICD-10-CM | POA: Diagnosis not present

## 2024-02-28 DIAGNOSIS — F419 Anxiety disorder, unspecified: Secondary | ICD-10-CM | POA: Insufficient documentation

## 2024-02-28 DIAGNOSIS — K219 Gastro-esophageal reflux disease without esophagitis: Secondary | ICD-10-CM | POA: Insufficient documentation

## 2024-02-28 DIAGNOSIS — F32A Depression, unspecified: Secondary | ICD-10-CM | POA: Insufficient documentation

## 2024-02-28 DIAGNOSIS — Z860101 Personal history of adenomatous and serrated colon polyps: Secondary | ICD-10-CM | POA: Diagnosis not present

## 2024-02-28 DIAGNOSIS — J4489 Other specified chronic obstructive pulmonary disease: Secondary | ICD-10-CM | POA: Diagnosis not present

## 2024-02-28 DIAGNOSIS — D649 Anemia, unspecified: Secondary | ICD-10-CM | POA: Insufficient documentation

## 2024-02-28 SURGERY — EGD (ESOPHAGOGASTRODUODENOSCOPY)
Anesthesia: Choice

## 2024-02-28 SURGERY — EGD (ESOPHAGOGASTRODUODENOSCOPY)
Anesthesia: General

## 2024-02-28 NOTE — Progress Notes (Signed)
 Pt  cancelled due to alcohol consumption this morning.

## 2024-02-28 NOTE — Interval H&P Note (Signed)
 History and Physical Interval Note:  02/28/2024 2:05 PM  Gabriel Koch  has presented today for surgery, with the diagnosis of ruq pain, gerd, gi bleeding, anemia, n/v, h/o duodenal adenoma.  The various methods of treatment have been discussed with the patient and family. After consideration of risks, benefits and other options for treatment, the patient has consented to  Procedure(s) with comments: EGD (ESOPHAGOGASTRODUODENOSCOPY) (N/A) - 2:00 pm, asa 3 as a surgical intervention.  The patient's history has been reviewed, patient examined, no change in status, stable for surgery.  I have reviewed the patient's chart and labs.  Questions were answered to the patient's satisfaction.     Gabriel Koch

## 2024-02-28 NOTE — Anesthesia Preprocedure Evaluation (Signed)
 Anesthesia Evaluation  Patient identified by MRN, date of birth, ID band Patient awake    Reviewed: Allergy & Precautions, H&P , NPO status , Patient's Chart, lab work & pertinent test results, reviewed documented beta blocker date and time   History of Anesthesia Complications (+) history of anesthetic complications  Airway Mallampati: II  TM Distance: >3 FB Neck ROM: full    Dental no notable dental hx.    Pulmonary asthma , COPD, Current Smoker   Pulmonary exam normal breath sounds clear to auscultation       Cardiovascular Exercise Tolerance: Good hypertension, + CAD   Rhythm:regular Rate:Normal     Neuro/Psych  PSYCHIATRIC DISORDERS Anxiety Depression    CVA    GI/Hepatic Neg liver ROS,GERD  ,,  Endo/Other  negative endocrine ROS    Renal/GU negative Renal ROS  negative genitourinary   Musculoskeletal   Abdominal   Peds  Hematology  (+) Blood dyscrasia, anemia   Anesthesia Other Findings   Reproductive/Obstetrics negative OB ROS                              Anesthesia Physical Anesthesia Plan  ASA: 3  Anesthesia Plan: General   Post-op Pain Management:    Induction:   PONV Risk Score and Plan: Propofol  infusion  Airway Management Planned:   Additional Equipment:   Intra-op Plan:   Post-operative Plan:   Informed Consent: I have reviewed the patients History and Physical, chart, labs and discussed the procedure including the risks, benefits and alternatives for the proposed anesthesia with the patient or authorized representative who has indicated his/her understanding and acceptance.     Dental Advisory Given  Plan Discussed with: CRNA  Anesthesia Plan Comments:         Anesthesia Quick Evaluation

## 2024-02-28 NOTE — Progress Notes (Signed)
 Procedure cancelled d/t pt consumption of alcohol this morning.  Pt discharged home and picked up by his cousin Tiny at 51.

## 2024-04-20 ENCOUNTER — Other Ambulatory Visit: Payer: Self-pay | Admitting: Gastroenterology

## 2024-04-24 ENCOUNTER — Telehealth: Payer: Self-pay | Admitting: Gastroenterology

## 2024-04-24 NOTE — Telephone Encounter (Signed)
 Opened in error

## 2024-05-08 ENCOUNTER — Encounter: Payer: Self-pay | Admitting: Radiology

## 2024-07-01 ENCOUNTER — Emergency Department (HOSPITAL_COMMUNITY)
Admission: EM | Admit: 2024-07-01 | Discharge: 2024-07-01 | Disposition: A | Discharge status: Home / Self Care | Payer: MEDICAID | Attending: Emergency Medicine | Admitting: Emergency Medicine

## 2024-07-01 ENCOUNTER — Encounter (HOSPITAL_COMMUNITY): Payer: Self-pay | Admitting: Emergency Medicine

## 2024-07-01 ENCOUNTER — Emergency Department (HOSPITAL_COMMUNITY): Payer: MEDICAID

## 2024-07-01 ENCOUNTER — Other Ambulatory Visit: Payer: Self-pay

## 2024-07-01 DIAGNOSIS — L97529 Non-pressure chronic ulcer of other part of left foot with unspecified severity: Secondary | ICD-10-CM | POA: Diagnosis not present

## 2024-07-01 DIAGNOSIS — Z79899 Other long term (current) drug therapy: Secondary | ICD-10-CM | POA: Diagnosis not present

## 2024-07-01 DIAGNOSIS — I10 Essential (primary) hypertension: Secondary | ICD-10-CM | POA: Diagnosis not present

## 2024-07-01 DIAGNOSIS — S91109A Unspecified open wound of unspecified toe(s) without damage to nail, initial encounter: Secondary | ICD-10-CM

## 2024-07-01 DIAGNOSIS — Z8673 Personal history of transient ischemic attack (TIA), and cerebral infarction without residual deficits: Secondary | ICD-10-CM | POA: Diagnosis not present

## 2024-07-01 DIAGNOSIS — M79675 Pain in left toe(s): Secondary | ICD-10-CM | POA: Diagnosis present

## 2024-07-01 DIAGNOSIS — I2510 Atherosclerotic heart disease of native coronary artery without angina pectoris: Secondary | ICD-10-CM | POA: Insufficient documentation

## 2024-07-01 DIAGNOSIS — E871 Hypo-osmolality and hyponatremia: Secondary | ICD-10-CM

## 2024-07-01 DIAGNOSIS — M79671 Pain in right foot: Secondary | ICD-10-CM | POA: Diagnosis not present

## 2024-07-01 HISTORY — DX: Type 2 diabetes mellitus without complications: E11.9

## 2024-07-01 LAB — COMPREHENSIVE METABOLIC PANEL WITH GFR
ALT: 63 U/L — ABNORMAL HIGH (ref 0–44)
AST: 100 U/L — ABNORMAL HIGH (ref 15–41)
Albumin: 3.9 g/dL (ref 3.5–5.0)
Alkaline Phosphatase: 382 U/L — ABNORMAL HIGH (ref 38–126)
Anion gap: 18 — ABNORMAL HIGH (ref 5–15)
BUN: 6 mg/dL (ref 6–20)
CO2: 19 mmol/L — ABNORMAL LOW (ref 22–32)
Calcium: 8.6 mg/dL — ABNORMAL LOW (ref 8.9–10.3)
Chloride: 90 mmol/L — ABNORMAL LOW (ref 98–111)
Creatinine, Ser: 0.9 mg/dL (ref 0.61–1.24)
GFR, Estimated: >60 mL/min
Glucose, Bld: 92 mg/dL (ref 70–99)
Potassium: 3.5 mmol/L (ref 3.5–5.1)
Sodium: 127 mmol/L — ABNORMAL LOW (ref 135–145)
Total Bilirubin: 1.6 mg/dL — ABNORMAL HIGH (ref 0.0–1.2)
Total Protein: 7.5 g/dL (ref 6.5–8.1)

## 2024-07-01 LAB — CBC WITH DIFFERENTIAL/PLATELET
Abs Immature Granulocytes: 0.04 K/uL (ref 0.00–0.07)
Basophils Absolute: 0 K/uL (ref 0.0–0.1)
Basophils Relative: 1 %
Eosinophils Absolute: 0.1 K/uL (ref 0.0–0.5)
Eosinophils Relative: 2 %
HCT: 27.2 % — ABNORMAL LOW (ref 39.0–52.0)
Hemoglobin: 9.4 g/dL — ABNORMAL LOW (ref 13.0–17.0)
Immature Granulocytes: 1 %
Lymphocytes Relative: 25 %
Lymphs Abs: 1.2 K/uL (ref 0.7–4.0)
MCH: 38.5 pg — ABNORMAL HIGH (ref 26.0–34.0)
MCHC: 34.6 g/dL (ref 30.0–36.0)
MCV: 111.5 fL — ABNORMAL HIGH (ref 80.0–100.0)
Monocytes Absolute: 0.4 K/uL (ref 0.1–1.0)
Monocytes Relative: 9 %
Neutro Abs: 3.1 K/uL (ref 1.7–7.7)
Neutrophils Relative %: 62 %
Platelets: 138 K/uL — ABNORMAL LOW (ref 150–400)
RBC: 2.44 MIL/uL — ABNORMAL LOW (ref 4.22–5.81)
RDW: 13.3 % (ref 11.5–15.5)
WBC: 4.9 K/uL (ref 4.0–10.5)
nRBC: 0 % (ref 0.0–0.2)

## 2024-07-01 MED ORDER — FOLIC ACID 1 MG PO TABS
1.0000 mg | ORAL_TABLET | Freq: Once | ORAL | Status: AC
  Administered 2024-07-01: 1 mg via ORAL
  Filled 2024-07-01: qty 1

## 2024-07-01 MED ORDER — AMOXICILLIN-POT CLAVULANATE 875-125 MG PO TABS: 1.0000 | ORAL_TABLET | Freq: Two times a day (BID) | ORAL | 0 refills | Status: DC

## 2024-07-01 MED ORDER — THIAMINE HCL 100 MG PO TABS
100.0000 mg | ORAL_TABLET | Freq: Once | ORAL | Status: AC
  Administered 2024-07-01: 100 mg via ORAL
  Filled 2024-07-01 (×2): qty 1

## 2024-07-01 MED ORDER — KETOROLAC TROMETHAMINE 15 MG/ML IJ SOLN
15.0000 mg | Freq: Once | INTRAMUSCULAR | Status: AC
  Administered 2024-07-01: 15 mg via INTRAVENOUS
  Filled 2024-07-01: qty 1

## 2024-07-01 MED ORDER — AMOXICILLIN-POT CLAVULANATE 875-125 MG PO TABS
1.0000 | ORAL_TABLET | Freq: Once | ORAL | Status: AC
  Administered 2024-07-01: 1 via ORAL
  Filled 2024-07-01: qty 1

## 2024-07-01 MED ORDER — SODIUM CHLORIDE 0.9 % IV BOLUS
1000.0000 mL | Freq: Once | INTRAVENOUS | Status: AC
  Administered 2024-07-01: 1000 mL via INTRAVENOUS

## 2024-07-01 NOTE — ED Triage Notes (Signed)
 [facility] to ed c/o bilateral leg/foot/toe pain. Pt has great and 2nd toe skin breakdown on left foot. Pt has ETOH on board.

## 2024-07-01 NOTE — Discharge Instructions (Signed)
 Valuated today in the ER for wound to your toes, this appears to be from pressure on the toes.  Keep bandaging between the toes to prevent the pressure, make sure you are not wearing tight fitting shoes, or wet shoes or socks.  We are putting you on antibiotics and have a follow-up close with your primary care doctor and/or foot doctor.  Come back to the ER if you have fever, worsening symptoms or other worrisome changes.  Please have your primary care doctor recheck your sodium in several days as well, it was low in the ED, we gave you some IV fluids to help replace this.  Is important for you to discuss stopping drinking alcohol with your PCP, also including some resources for this.

## 2024-07-01 NOTE — ED Provider Notes (Signed)
 " [place] EMERGENCY DEPARTMENT AT [facility] Provider Note   CSN: [id number] Arrival date & time: 07/01/24  1529     Patient presents with: Toe Pain (Left)   Gabriel Koch is a 58 y.o. male.  Patient has history of CAD, hypertension, GERD, CVA presents to the ER complaining of bilateral foot pain and discoloration that started several weeks ago, over the past week he has noticed wounds between the great toe and second toe on the left foot that has been painful as well.  He states he talked to somebody at work EMS who advised to come to the ER for further evaluation.  He denies any numbness or tingling of his feet.  Denies any injury or trauma, denies changes to his footwear.    Toe Pain       Prior to Admission medications  Medication Sig Start Date End Date Taking? Authorizing Provider  acetaminophen  (TYLENOL ) 500 MG tablet Take 1,000 mg by mouth every 6 (six) hours as needed for mild pain (pain score 1-3).    [provider]  albuterol  (VENTOLIN  HFA) 108 (90 Base) MCG/ACT inhaler Inhale 1-2 puffs into the lungs every 6 (six) hours as needed for wheezing or shortness of breath.    [provider]  amLODipine  (NORVASC ) 5 MG tablet Take 5 mg by mouth daily.    [provider]  atorvastatin  (LIPITOR) 40 MG tablet Take 40 mg by mouth daily.    [provider]  buPROPion  (WELLBUTRIN ) 75 MG tablet Take 150 mg by mouth 2 (two) times daily.    [provider]  busPIRone  (BUSPAR ) 10 MG tablet Take 10 mg by mouth 2 (two) times daily.    [provider]  cephALEXin  (KEFLEX ) 500 MG capsule Take 1 capsule (500 mg total) by mouth 4 (four) times daily. 02/15/24   Haze Lonni PARAS, MD  esomeprazole  (NEXIUM ) 40 MG capsule TAKE ONE CAPSULE BY MOUTH TWICE DAILY BEFORE meals 04/20/24   Ezzard Sonny RAMAN, PA-C  folic acid  (FOLVITE ) 1 MG tablet Take 1 mg by mouth daily.    [provider]  furosemide  (LASIX ) 20 MG tablet Take  1 tablet (20 mg total) by mouth daily. 02/15/24   Haze Lonni PARAS, MD  gabapentin  (NEURONTIN ) 100 MG capsule Take 100 mg by mouth 3 (three) times daily.    [provider]  meloxicam  (MOBIC ) 7.5 MG tablet Take 1 tablet (7.5 mg total) by mouth daily as needed for pain. 02/04/24   Vicci Afton CROME, MD  Multiple Vitamin (MULTIVITAMIN WITH MINERALS) TABS tablet Take 1 tablet by mouth daily. 02/05/24   Johnson, Clanford L, MD  thiamine  (VITAMIN B-1) 100 MG tablet Take 1 tablet (100 mg total) by mouth daily. 02/05/24   Johnson, Clanford L, MD  tiotropium (SPIRIVA ) 18 MCG inhalation capsule Place 18 mcg into inhaler and inhale daily.    [provider]  traZODone  (DESYREL ) 100 MG tablet Take 100 mg by mouth at bedtime.    [provider]  zolpidem (AMBIEN) 10 MG tablet Take 10 mg by mouth at bedtime.    [provider]    Allergies: Bee venom    Review of Systems  Updated Vital Signs BP (!) 109/95   Pulse 100   Temp 97.8 F (36.6 C) (Temporal)   Resp 18   Ht 5' 7 (1.702 m)   Wt 58 kg   SpO2 99%   BMI 20.03 kg/m   Physical Exam Vitals and nursing  note reviewed.  Constitutional:      General: He is not in acute distress.    Appearance: He is well-developed.  HENT:     Head: Normocephalic and atraumatic.     Mouth/Throat:     Mouth: Mucous membranes are moist.  Eyes:     Conjunctiva/sclera: Conjunctivae normal.  Cardiovascular:     Rate and Rhythm: Normal rate and regular rhythm.     Heart sounds: No murmur heard. Pulmonary:     Effort: Pulmonary effort is normal. No respiratory distress.     Breath sounds: Normal breath sounds.  Abdominal:     Palpations: Abdomen is soft.     Tenderness: There is no abdominal tenderness.  Musculoskeletal:        General: No swelling or deformity.     Cervical back: Neck supple.     Right lower leg: No edema.     Left lower leg: No edema.     Comments: DP and PT pulses on lateral feet are palpable.   Capillary fill is intact.  There is no temperature discrepancy between the limbs.  Skin:    General: Skin is warm and dry.     Capillary Refill: Capillary refill takes less than 2 seconds.     Comments: Ulceration to the skin of the great toe and second toe on the left foot where the toes touch  Neurological:     General: No focal deficit present.     Mental Status: He is alert and oriented to person, place, and time. Mental status is at baseline.  Psychiatric:        Mood and Affect: Mood normal.     (all labs ordered are listed, but only abnormal results are displayed) Labs Reviewed - No data to display  EKG: None  Radiology: No results found.   Procedures   Medications Ordered in the ED - No data to display  Clinical Course as of 07/01/24 1800  Sat Jul 01, 2024  1758 Media in the chart is of patient's left toe wounds.  Media labeled right toe wounds is incorrect, wounds are of the left toe There are no wounds of the right foot  [CB]    Clinical Course User Index [CB] Suellen Sherran LABOR, PA-C                                 Medical Decision Making This patient presents to the ED for concern of wounds to left toes, this involves an extensive number of treatment options, and is a complaint that carries with it a high risk of complications and morbidity.  The differential diagnosis includes this, osteomyelitis, abscess, ischemic limb, other   Co morbidities that complicate the patient evaluation : Use disorder, peripheral polyneuropathy   Additional history obtained:  Additional history obtained from EMR External records from outside source obtained and reviewed including prior notes and labs     Imaging Studies ordered:  I ordered imaging studies including x-ray left foot which shows no fractures, no bony erosion I independently visualized and interpreted imaging within scope of identifying emergent findings  I agree with the radiologist  interpretation     Problem List / ED Course / Critical interventions / Medication management  Patient complains that bilateral feet feel swollen, discolored and he has wounds between his left great toe and second toe.  He denies any injury or trauma, denies changes footwear, denies wearing  wet socks or shoes.  He has superficial ulceration in this area.  No exudate from the wounds.  No sign of abscess.  X-ray shows no bony changes or soft tissue gas.  He has perhaps some slight erythema of his lower third of his bilateral legs and dorsum of his feet slightly greater on the left than the right.  Given the open wounds of the left foot we will treat for possible infection.  Could be venous stasis changes as well.  He does have intact pulses in his bilateral feet and good capillary refill and normal temperature legs so I do not feel he has limb ischemia at this time.  Labs have been ordered as well, patient denies history of diabetes, does drink beer daily.  Noted to have sodium of 127, has been as low as 120 past which has required hospitalization.  Today given some IV fluids, advised to follow-up with outpatient alcohol resources, does not need admission for this.  LFTs are abnormal similar to previous, anion gap increased at 18 likely due to his alcohol intake.  He is chronically ill-appearing but does not appear to be toxic and when discussing his findings he is able to provide a clear history and is clinically sober.  He is eating a sandwich, was agreeable with outpatient follow-up with podiatry. I ordered medication including Toradol  for pain, fluids for hyponatremia Reevaluation of the patient after these medicines showed that the patient improved I have reviewed the patients home medicines and have made adjustments as needed       Amount and/or Complexity of Data Reviewed External Data Reviewed: labs and notes. Labs: ordered. Decision-making details documented in ED Course. Radiology:  ordered and independent interpretation performed.    Details: X-ray left foot shows no fracture, no dislocation, no bony erosion to suggestive of osteomyelitis, I agree with radiology read.  They also note findings suggestive of hammer or true deformities 2nd through 5th digits left foot  Risk Prescription drug management.        Final diagnoses:  None    ED Discharge Orders     None          Suellen Sherran LABOR, [place] 07/01/24 2043  "
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
## 2024-07-09 ENCOUNTER — Inpatient Hospital Stay (HOSPITAL_COMMUNITY)
Admission: EM | Admit: 2024-07-09 | Discharge: 2024-07-15 | DRG: 896 | Disposition: A | Discharge status: Home Health | Payer: MEDICAID | Attending: Hospitalist | Admitting: Hospitalist

## 2024-07-09 ENCOUNTER — Emergency Department (HOSPITAL_COMMUNITY): Payer: MEDICAID

## 2024-07-09 ENCOUNTER — Other Ambulatory Visit: Payer: Self-pay

## 2024-07-09 ENCOUNTER — Encounter (HOSPITAL_COMMUNITY): Payer: Self-pay | Admitting: Emergency Medicine

## 2024-07-09 DIAGNOSIS — F419 Anxiety disorder, unspecified: Secondary | ICD-10-CM | POA: Diagnosis present

## 2024-07-09 DIAGNOSIS — F4310 Post-traumatic stress disorder, unspecified: Secondary | ICD-10-CM | POA: Diagnosis present

## 2024-07-09 DIAGNOSIS — F32A Depression, unspecified: Secondary | ICD-10-CM | POA: Diagnosis present

## 2024-07-09 DIAGNOSIS — F10229 Alcohol dependence with intoxication, unspecified: Secondary | ICD-10-CM | POA: Diagnosis present

## 2024-07-09 DIAGNOSIS — R10A2 Flank pain, left side: Secondary | ICD-10-CM

## 2024-07-09 DIAGNOSIS — F10930 Alcohol use, unspecified with withdrawal, uncomplicated: Principal | ICD-10-CM

## 2024-07-09 DIAGNOSIS — S3013XA Contusion of flank (latus) region, initial encounter: Secondary | ICD-10-CM | POA: Diagnosis present

## 2024-07-09 DIAGNOSIS — S32009A Unspecified fracture of unspecified lumbar vertebra, initial encounter for closed fracture: Secondary | ICD-10-CM | POA: Diagnosis present

## 2024-07-09 DIAGNOSIS — E43 Unspecified severe protein-calorie malnutrition: Secondary | ICD-10-CM | POA: Diagnosis present

## 2024-07-09 DIAGNOSIS — Z79899 Other long term (current) drug therapy: Secondary | ICD-10-CM

## 2024-07-09 DIAGNOSIS — Z6820 Body mass index (BMI) 20.0-20.9, adult: Secondary | ICD-10-CM

## 2024-07-09 DIAGNOSIS — F1010 Alcohol abuse, uncomplicated: Secondary | ICD-10-CM | POA: Diagnosis present

## 2024-07-09 DIAGNOSIS — E876 Hypokalemia: Secondary | ICD-10-CM | POA: Diagnosis present

## 2024-07-09 DIAGNOSIS — K709 Alcoholic liver disease, unspecified: Secondary | ICD-10-CM | POA: Diagnosis present

## 2024-07-09 DIAGNOSIS — Z87891 Personal history of nicotine dependence: Secondary | ICD-10-CM

## 2024-07-09 DIAGNOSIS — R197 Diarrhea, unspecified: Secondary | ICD-10-CM | POA: Diagnosis not present

## 2024-07-09 DIAGNOSIS — E871 Hypo-osmolality and hyponatremia: Secondary | ICD-10-CM | POA: Diagnosis not present

## 2024-07-09 DIAGNOSIS — Z8673 Personal history of transient ischemic attack (TIA), and cerebral infarction without residual deficits: Secondary | ICD-10-CM

## 2024-07-09 DIAGNOSIS — R296 Repeated falls: Secondary | ICD-10-CM | POA: Diagnosis present

## 2024-07-09 DIAGNOSIS — W19XXXA Unspecified fall, initial encounter: Secondary | ICD-10-CM | POA: Diagnosis present

## 2024-07-09 DIAGNOSIS — E872 Acidosis, unspecified: Secondary | ICD-10-CM | POA: Diagnosis present

## 2024-07-09 DIAGNOSIS — F10230 Alcohol dependence with withdrawal, uncomplicated: Principal | ICD-10-CM | POA: Diagnosis present

## 2024-07-09 DIAGNOSIS — Z91030 Bee allergy status: Secondary | ICD-10-CM

## 2024-07-09 DIAGNOSIS — E119 Type 2 diabetes mellitus without complications: Secondary | ICD-10-CM | POA: Diagnosis present

## 2024-07-09 DIAGNOSIS — F10939 Alcohol use, unspecified with withdrawal, unspecified: Secondary | ICD-10-CM | POA: Diagnosis present

## 2024-07-09 DIAGNOSIS — I10 Essential (primary) hypertension: Secondary | ICD-10-CM | POA: Diagnosis present

## 2024-07-09 DIAGNOSIS — K219 Gastro-esophageal reflux disease without esophagitis: Secondary | ICD-10-CM | POA: Diagnosis present

## 2024-07-09 DIAGNOSIS — S91109A Unspecified open wound of unspecified toe(s) without damage to nail, initial encounter: Secondary | ICD-10-CM

## 2024-07-09 DIAGNOSIS — Y900 Blood alcohol level of less than 20 mg/100 ml: Secondary | ICD-10-CM | POA: Diagnosis present

## 2024-07-09 DIAGNOSIS — E860 Dehydration: Secondary | ICD-10-CM | POA: Diagnosis present

## 2024-07-09 DIAGNOSIS — J449 Chronic obstructive pulmonary disease, unspecified: Secondary | ICD-10-CM | POA: Diagnosis present

## 2024-07-09 DIAGNOSIS — S32018A Other fracture of first lumbar vertebra, initial encounter for closed fracture: Secondary | ICD-10-CM | POA: Diagnosis present

## 2024-07-09 DIAGNOSIS — R000 Tachycardia, unspecified: Secondary | ICD-10-CM

## 2024-07-09 LAB — LACTIC ACID, PLASMA
Lactic Acid, Venous: 2.7 mmol/L (ref 0.5–1.9)
Lactic Acid, Venous: 2.1 mmol/L (ref 0.5–1.9)

## 2024-07-09 LAB — URINALYSIS, ROUTINE W REFLEX MICROSCOPIC
Bacteria, UA: NONE SEEN
Bilirubin Urine: NEGATIVE
Glucose, UA: NEGATIVE mg/dL
Ketones, ur: NEGATIVE mg/dL
Leukocytes,Ua: NEGATIVE
Nitrite: NEGATIVE
Protein, ur: 100 mg/dL — AB
Specific Gravity, Urine: 1.024 (ref 1.005–1.030)
pH: 5 (ref 5.0–8.0)

## 2024-07-09 LAB — CBC WITH DIFFERENTIAL/PLATELET
Abs Immature Granulocytes: 0.02 K/uL (ref 0.00–0.07)
Basophils Absolute: 0 K/uL (ref 0.0–0.1)
Basophils Relative: 0 %
Eosinophils Absolute: 0 K/uL (ref 0.0–0.5)
Eosinophils Relative: 0 %
HCT: 28.3 % — ABNORMAL LOW (ref 39.0–52.0)
Hemoglobin: 9.8 g/dL — ABNORMAL LOW (ref 13.0–17.0)
Immature Granulocytes: 0 %
Lymphocytes Relative: 14 %
Lymphs Abs: 0.9 K/uL (ref 0.7–4.0)
MCH: 37.4 pg — ABNORMAL HIGH (ref 26.0–34.0)
MCHC: 34.6 g/dL (ref 30.0–36.0)
MCV: 108 fL — ABNORMAL HIGH (ref 80.0–100.0)
Monocytes Absolute: 0.5 K/uL (ref 0.1–1.0)
Monocytes Relative: 7 %
Neutro Abs: 5.2 K/uL (ref 1.7–7.7)
Neutrophils Relative %: 79 %
Platelets: 164 K/uL (ref 150–400)
RBC: 2.62 MIL/uL — ABNORMAL LOW (ref 4.22–5.81)
RDW: 13.5 % (ref 11.5–15.5)
WBC: 6.7 K/uL (ref 4.0–10.5)
nRBC: 0 % (ref 0.0–0.2)

## 2024-07-09 LAB — URINE DRUG SCREEN
Amphetamines: NEGATIVE
Barbiturates: NEGATIVE
Benzodiazepines: POSITIVE — AB
Cocaine: NEGATIVE
Fentanyl: NEGATIVE
Methadone Scn, Ur: NEGATIVE
Opiates: NEGATIVE
Tetrahydrocannabinol: NEGATIVE

## 2024-07-09 LAB — COMPREHENSIVE METABOLIC PANEL WITH GFR
ALT: 62 U/L — ABNORMAL HIGH (ref 0–44)
AST: 146 U/L — ABNORMAL HIGH (ref 15–41)
Albumin: 4 g/dL (ref 3.5–5.0)
Alkaline Phosphatase: 308 U/L — ABNORMAL HIGH (ref 38–126)
Anion gap: 15 (ref 5–15)
BUN: 6 mg/dL (ref 6–20)
CO2: 22 mmol/L (ref 22–32)
Calcium: 9 mg/dL (ref 8.9–10.3)
Chloride: 93 mmol/L — ABNORMAL LOW (ref 98–111)
Creatinine, Ser: 0.99 mg/dL (ref 0.61–1.24)
GFR, Estimated: >60 mL/min
Glucose, Bld: 110 mg/dL — ABNORMAL HIGH (ref 70–99)
Potassium: 3.7 mmol/L (ref 3.5–5.1)
Sodium: 129 mmol/L — ABNORMAL LOW (ref 135–145)
Total Bilirubin: 1.7 mg/dL — ABNORMAL HIGH (ref 0.0–1.2)
Total Protein: 7.5 g/dL (ref 6.5–8.1)

## 2024-07-09 LAB — FOLATE: Folate: >20 ng/mL

## 2024-07-09 LAB — ETHANOL: Alcohol, Ethyl (B): <15 mg/dL

## 2024-07-09 LAB — LIPASE, BLOOD: Lipase: 65 U/L — ABNORMAL HIGH (ref 11–51)

## 2024-07-09 MED ORDER — METOCLOPRAMIDE HCL 5 MG/ML IJ SOLN
5.0000 mg | Freq: Once | INTRAMUSCULAR | Status: AC
  Administered 2024-07-09: 5 mg via INTRAVENOUS
  Filled 2024-07-09: qty 2

## 2024-07-09 MED ORDER — MORPHINE SULFATE (PF) 4 MG/ML IV SOLN
4.0000 mg | Freq: Once | INTRAVENOUS | Status: AC
  Administered 2024-07-09: 4 mg via INTRAVENOUS
  Filled 2024-07-09: qty 1

## 2024-07-09 MED ORDER — FOLIC ACID 1 MG PO TABS
1.0000 mg | ORAL_TABLET | Freq: Once | ORAL | Status: AC
  Administered 2024-07-09: 1 mg via ORAL
  Filled 2024-07-09: qty 1

## 2024-07-09 MED ORDER — THIAMINE HCL 100 MG PO TABS
100.0000 mg | ORAL_TABLET | Freq: Once | ORAL | Status: AC
  Administered 2024-07-09: 100 mg via ORAL
  Filled 2024-07-09 (×2): qty 1

## 2024-07-09 MED ORDER — THIAMINE MONONITRATE 100 MG PO TABS
100.0000 mg | ORAL_TABLET | Freq: Every day | ORAL | Status: DC
  Administered 2024-07-10 – 2024-07-15 (×6): 100 mg via ORAL
  Filled 2024-07-09 (×6): qty 1

## 2024-07-09 MED ORDER — SODIUM CHLORIDE 0.9 % IV BOLUS
1000.0000 mL | Freq: Once | INTRAVENOUS | Status: AC
  Administered 2024-07-09: 1000 mL via INTRAVENOUS

## 2024-07-09 MED ORDER — LORAZEPAM 2 MG/ML IJ SOLN
0.0000 mg | Freq: Two times a day (BID) | INTRAMUSCULAR | Status: AC
  Administered 2024-07-11 – 2024-07-13 (×2): 2 mg via INTRAVENOUS
  Filled 2024-07-09 (×2): qty 1

## 2024-07-09 MED ORDER — PANTOPRAZOLE SODIUM 40 MG IV SOLR: 40.0000 mg | Freq: Every day | INTRAVENOUS | Status: DC

## 2024-07-09 MED ORDER — FOLIC ACID 1 MG PO TABS
1.0000 mg | ORAL_TABLET | Freq: Every day | ORAL | Status: DC
  Administered 2024-07-10 – 2024-07-15 (×6): 1 mg via ORAL
  Filled 2024-07-09 (×6): qty 1

## 2024-07-09 MED ORDER — ADULT MULTIVITAMIN W/MINERALS CH
1.0000 | ORAL_TABLET | Freq: Every day | ORAL | Status: DC
  Administered 2024-07-10 – 2024-07-15 (×6): 1 via ORAL
  Filled 2024-07-09 (×6): qty 1

## 2024-07-09 MED ORDER — LORAZEPAM 2 MG/ML IJ SOLN
1.0000 mg | INTRAMUSCULAR | Status: AC | PRN
  Administered 2024-07-10: 1 mg via INTRAVENOUS
  Filled 2024-07-09: qty 1

## 2024-07-09 MED ORDER — LORAZEPAM 2 MG/ML IJ SOLN
0.0000 mg | Freq: Four times a day (QID) | INTRAMUSCULAR | Status: AC
  Administered 2024-07-09: 2 mg via INTRAVENOUS
  Filled 2024-07-09: qty 1

## 2024-07-09 MED ORDER — THIAMINE HCL 100 MG/ML IJ SOLN: 100.0000 mg | Freq: Every day | INTRAMUSCULAR | Status: DC

## 2024-07-09 MED ORDER — LORAZEPAM 1 MG PO TABS
1.0000 mg | ORAL_TABLET | ORAL | Status: AC | PRN
  Administered 2024-07-10 – 2024-07-12 (×6): 1 mg via ORAL
  Filled 2024-07-09 (×6): qty 1

## 2024-07-09 MED ORDER — IOHEXOL 300 MG/ML  SOLN
100.0000 mL | Freq: Once | INTRAMUSCULAR | Status: AC | PRN
  Administered 2024-07-09: 100 mL via INTRAVENOUS

## 2024-07-09 MED ORDER — ACETAMINOPHEN 500 MG PO TABS
1000.0000 mg | ORAL_TABLET | Freq: Once | ORAL | Status: AC
  Administered 2024-07-09: 1000 mg via ORAL
  Filled 2024-07-09: qty 2

## 2024-07-09 NOTE — ED Provider Notes (Signed)
 " [place] EMERGENCY DEPARTMENT AT [facility] Provider Note   CSN: [id number] Arrival date & time: 07/09/24  1544     Patient presents with: Nausea   Gabriel Koch is a 59 y.o. male.  Patient is a 59 year old male with a history of GERD, hypertension, tobacco use, alcohol abuse, and recurrent falls who presents to the ED for left-sided pain after a fall 3 days ago.  Patient notes he had been drinking on Friday and then fell against a steel table.  Notes he has had severe pain on the left side of his body since.  He states he may have hit his head but he is unsure.  He does not entirely remember the fall.  He notes that yesterday morning he began throwing up and has not been able to eat or drink in 2 days.  Notes he has had some diarrhea as well and has had burning with urination.  Notes a headache.  Also notes shortness of breath but feels similar to his baseline.  Denies dizziness, syncope, chest pain, hematemesis, hematochezia.   HPI     Prior to Admission medications  Medication Sig Start Date End Date Taking? Authorizing Provider  acetaminophen  (TYLENOL ) 500 MG tablet Take 1,000 mg by mouth every 6 (six) hours as needed for mild pain (pain score 1-3).    [provider]  albuterol  (VENTOLIN  HFA) 108 (90 Base) MCG/ACT inhaler Inhale 1-2 puffs into the lungs every 6 (six) hours as needed for wheezing or shortness of breath.    [provider]  amLODipine  (NORVASC ) 5 MG tablet Take 5 mg by mouth daily.    [provider]  amoxicillin -clavulanate (AUGMENTIN ) 875-125 MG tablet Take 1 tablet by mouth every 12 (twelve) hours. 07/01/24   Suellen Cantor A, PA-C  atorvastatin  (LIPITOR) 40 MG tablet Take 40 mg by mouth daily.    [provider]  buPROPion  (WELLBUTRIN ) 75 MG tablet Take 150 mg by mouth 2 (two) times daily.    [provider]  busPIRone  (BUSPAR ) 10 MG tablet Take 10 mg by mouth 2 (two) times daily.    [provider]  cephALEXin  (KEFLEX ) 500 MG capsule Take 1 capsule (500 mg total) by mouth 4 (four) times daily. 02/15/24   Haze Lonni PARAS, MD  esomeprazole  (NEXIUM ) 40 MG capsule TAKE ONE CAPSULE BY MOUTH TWICE DAILY BEFORE meals 04/20/24   Ezzard Sonny RAMAN, PA-C  folic acid  (FOLVITE ) 1 MG tablet Take 1 mg by mouth daily.    [provider]  furosemide  (LASIX ) 20 MG tablet Take 1 tablet (20 mg total) by mouth daily. 02/15/24   Haze Lonni PARAS, MD  gabapentin  (NEURONTIN ) 100 MG capsule Take 100 mg by mouth 3 (three) times daily.    [provider]  meloxicam  (MOBIC ) 7.5 MG tablet Take 1 tablet (7.5 mg total) by mouth daily as needed for pain. 02/04/24   Vicci Afton CROME, MD  Multiple Vitamin (MULTIVITAMIN WITH MINERALS) TABS tablet Take 1 tablet by mouth daily. 02/05/24   Johnson, Clanford L, MD  thiamine  (VITAMIN B-1) 100 MG tablet Take 1 tablet (100 mg total) by mouth daily. 02/05/24   Johnson, Clanford L, MD  tiotropium (SPIRIVA ) 18 MCG inhalation capsule Place 18 mcg into inhaler and inhale daily.    [provider]  traZODone  (DESYREL ) 100 MG tablet Take 100 mg by mouth at bedtime.    [provider]  zolpidem (AMBIEN) 10 MG tablet Take 10 mg by mouth  at bedtime.    [provider]    Allergies: Bee venom    Review of Systems  Constitutional:  Negative for chills and fever.  Respiratory:  Positive for cough and shortness of breath.   Cardiovascular:  Negative for chest pain.  Gastrointestinal:  Positive for abdominal pain, diarrhea, nausea and vomiting. Negative for blood in stool.  Genitourinary:  Positive for dysuria.  Neurological:  Positive for headaches. Negative for dizziness and syncope.    Updated Vital Signs BP 130/85   Pulse 83   Temp 98.2 F (36.8 C)   Resp 18   Ht 5' 7 (1.702 m)   Wt 58 kg   SpO2 96%   BMI 20.03 kg/m   Physical Exam Constitutional:      Appearance: Normal appearance.     Comments: Shaking but states he  is in pain  HENT:     Head: Normocephalic and atraumatic.     Nose: Nose normal.     Mouth/Throat:     Mouth: Mucous membranes are moist.     Pharynx: Oropharynx is clear.  Eyes:     Extraocular Movements: Extraocular movements intact.     Pupils: Pupils are equal, round, and reactive to light.  Cardiovascular:     Rate and Rhythm: Tachycardia present.  Pulmonary:     Effort: Pulmonary effort is normal.     Breath sounds: Normal breath sounds.  Abdominal:     General: Bowel sounds are normal.     Palpations: Abdomen is soft.     Tenderness: There is abdominal tenderness.     Comments: Generalized tenderness worse on the left flank.  He does have ecchymosis present to the left lower flank area.  No central lumbar tenderness.  Musculoskeletal:        General: Normal range of motion.     Comments: Full range of motion of all 4 extremities with equal strength  Skin:    General: Skin is warm.     Comments: Wounds noted between the 1st and 2nd toe on the left foot.  Overlying crusting.  Area is tender to palpation.  Please see media imaging for characterization  Neurological:     Mental Status: He is alert and oriented to person, place, and time.  Psychiatric:     Comments: Anxious and potentially intoxicated     (all labs ordered are listed, but only abnormal results are displayed) Labs Reviewed  COMPREHENSIVE METABOLIC PANEL WITH GFR - Abnormal; Notable for the following components:      Result Value   Sodium 129 (*)    Chloride 93 (*)    Glucose, Bld 110 (*)    AST 146 (*)    ALT 62 (*)    Alkaline Phosphatase 308 (*)    Total Bilirubin 1.7 (*)    All other components within normal limits  CBC WITH DIFFERENTIAL/PLATELET - Abnormal; Notable for the following components:   RBC 2.62 (*)    Hemoglobin 9.8 (*)    HCT 28.3 (*)    MCV 108.0 (*)    MCH 37.4 (*)    All other components within normal limits  LIPASE, BLOOD - Abnormal; Notable for the following components:    Lipase 65 (*)    All other components within normal limits  URINALYSIS, ROUTINE W REFLEX MICROSCOPIC - Abnormal; Notable for the following components:   Color, Urine AMBER (*)    Hgb urine dipstick MODERATE (*)    Protein, ur 100 (*)  All other components within normal limits  LACTIC ACID, PLASMA - Abnormal; Notable for the following components:   Lactic Acid, Venous 2.7 (*)    All other components within normal limits  LACTIC ACID, PLASMA - Abnormal; Notable for the following components:   Lactic Acid, Venous 2.1 (*)    All other components within normal limits  URINE DRUG SCREEN - Abnormal; Notable for the following components:   Benzodiazepines POSITIVE (*)    All other components within normal limits  GASTROINTESTINAL PANEL BY PCR, STOOL (REPLACES STOOL CULTURE)  C DIFFICILE QUICK SCREEN W PCR REFLEX    ETHANOL  FOLATE  VITAMIN B1  HEMOGLOBIN A1C    EKG: None  Radiology: CT CHEST ABDOMEN PELVIS W CONTRAST Result Date: 07/09/2024 CLINICAL DATA:  Several day history of nausea, vomiting, and diarrhea. Status post fall onto left side EXAM: CT CHEST, ABDOMEN, AND PELVIS WITH CONTRAST TECHNIQUE: Multidetector CT imaging of the chest, abdomen and pelvis was performed following the standard protocol during bolus administration of intravenous contrast. RADIATION DOSE REDUCTION: This exam was performed according to the departmental dose-optimization program which includes automated exposure control, adjustment of the mA and/or kV according to patient size and/or use of iterative reconstruction technique. CONTRAST:  OMNIPAQUE  IOHEXOL  300 MG/ML  SOLN COMPARISON:  CT chest, abdomen, and pelvis dated 11/02/2023 FINDINGS: CT CHEST FINDINGS Cardiovascular: Normal heart size. No significant pericardial fluid/thickening. Great vessels are normal in course and caliber. No central pulmonary emboli. Coronary artery calcifications. Mediastinum/Nodes: Imaged thyroid  gland without nodules meeting  criteria for imaging follow-up by size. Normal esophagus. No pathologically enlarged axillary, supraclavicular, mediastinal, or hilar lymph nodes. Lungs/Pleura: The central airways are patent. No focal consolidation. No suspicious pulmonary nodules. No pneumothorax. No pleural effusion. Musculoskeletal: Healing left anterolateral third through fifth rib fractures. 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 CT ABDOMEN PELVIS FINDINGS Hepatobiliary: Diffuse parenchymal hypoattenuation can be seen with hepatic steatosis. Subcentimeter segment 3 hypodensity (2:68) and 6 hypodensity (2:65), too small to characterize. No intra or extrahepatic biliary ductal dilation. Normal gallbladder. Mild mesenteric stranding in the region of the gallbladder fossa. Pancreas: No focal lesions or main ductal dilation. Spleen: Normal in size without focal abnormality. Adrenals/Urinary Tract: No adrenal nodules. No suspicious renal mass, calculi, or hydronephrosis. Underdistended urinary bladder. Stomach/Bowel: Normal appearance of the stomach. No evidence of bowel wall thickening, distention, or inflammatory changes. Appendectomy. Vascular/Lymphatic: Aortic atherosclerosis. No enlarged abdominal or pelvic lymph nodes. Reproductive: Prostate is unremarkable. Other: Small volume lower abdominal and pelvic free fluid measures simple attenuation. Musculoskeletal: Acute to subacute right L1 transverse process fracture (2:67). 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 Vertically oriented lucency through the left L5 pedicle (9:103) appears unchanged compared to 11/02/2023 and may represent a vascular channel. Subcutaneous soft tissue stranding in the left flank with ill-defined hyper attenuation. IMPRESSION: 1. Subcutaneous soft tissue stranding in the left flank with ill-defined hyper attenuation, likely representing a soft tissue contusion with hematoma. 2. Acute to subacute right L1 transverse process  fracture. 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 3. Healing left anterolateral third through fifth rib fractures. 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 4. Mesenteric stranding in the gallbladder fossa and small volume lower abdominal and pelvic free fluid, likely reactive. 5. Hepatic steatosis. 6.  Aortic Atherosclerosis (ICD10-I70.0). Electronically Signed   By: Limin  Xu M.D.   On: 07/09/2024 17:50   CT Cervical Spine Wo Contrast Result Date: 07/09/2024 CLINICAL DATA:  Fall EXAM:  CT CERVICAL SPINE WITHOUT CONTRAST TECHNIQUE: Multidetector CT imaging of the cervical spine was performed without intravenous contrast. Multiplanar CT image reconstructions were also generated. RADIATION DOSE REDUCTION: This exam was performed according to the departmental dose-optimization program which includes automated exposure control, adjustment of the mA and/or kV according to patient size and/or use of iterative reconstruction technique. COMPARISON:  CT 11/02/2023 FINDINGS: Alignment: Facet alignment is normal. Straightening of the cervical spine. No subluxation Skull base and vertebrae: No acute fracture. No primary bone lesion or focal pathologic process. Soft tissues and spinal canal: No prevertebral fluid or swelling. No visible canal hematoma. Disc levels: Moderate disc space narrowing C5-C6 and mild to moderate narrowing at C6-C7. Posterior disc osteophyte complex at C5-C6 with mild mass effect on the ventral thecal sac. Left foraminal narrowing at C5-C6. Upper chest: Mild emphysema Other: None IMPRESSION: Straightening of the cervical spine with degenerative changes. No acute osseous abnormality. Electronically Signed   By: Luke Bun M.D.   On: 07/09/2024 17:40   CT Head Wo Contrast Result Date: 07/09/2024 CLINICAL DATA:  Fall EXAM: CT HEAD WITHOUT CONTRAST TECHNIQUE: Contiguous axial images were obtained from the base of the skull through the vertex without intravenous  contrast. RADIATION DOSE REDUCTION: This exam was performed according to the departmental dose-optimization program which includes automated exposure control, adjustment of the mA and/or kV according to patient size and/or use of iterative reconstruction technique. COMPARISON:  CT brain 11/02/2023, 02/02/2024 FINDINGS: Brain: No acute territorial infarction, hemorrhage or intracranial mass. Age advanced atrophy and chronic small vessel ischemic changes of the white matter. 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 Nonenlarged ventricles Vascular: No hyperdense vessels.  Carotid vascular calcification. Skull: Normal. Negative for fracture or focal lesion. Sinuses/Orbits: No acute finding. Other: None IMPRESSION: 1. No CT evidence for acute intracranial abnormality. 2. Age advanced atrophy and chronic small vessel ischemic changes of the white matter. Electronically Signed   By: Luke Bun M.D.   On: 07/09/2024 17:36   DG Foot 2 Views Left Result Date: 07/09/2024 CLINICAL DATA:  Left great toe wound and fall EXAM: LEFT FOOT - 2 VIEW COMPARISON:  July 01, 2024 FINDINGS: There is no evidence of fracture or dislocation. There is no evidence of arthropathy or other focal bone abnormality. Soft tissues are unremarkable. IMPRESSION: Negative. Electronically Signed   By: Lynwood Landy Raddle M.D.   On: 07/09/2024 17:04      Medications Ordered in the ED  LORazepam  (ATIVAN ) tablet 1-4 mg (has no administration in time range)    Or  LORazepam  (ATIVAN ) injection 1-4 mg (has no administration in time range)  thiamine  (VITAMIN B1) tablet 100 mg (has no administration in time range)    Or  thiamine  (VITAMIN B1) injection 100 mg (has no administration in time range)  folic acid  (FOLVITE ) tablet 1 mg (has no administration in time range)  multivitamin with minerals tablet 1 tablet (has no administration in time range)  LORazepam  (ATIVAN ) injection 0-4 mg (2 mg Intravenous Given 07/09/24 2219)     Followed by  LORazepam  (ATIVAN ) injection 0-4 mg (has no administration in time range)  pantoprazole  (PROTONIX ) injection 40 mg (has no administration in time range)  sodium chloride  0.9 % bolus 1,000 mL (0 mLs Intravenous Stopped 07/09/24 2021)  morphine  (PF) 4 MG/ML injection 4 mg (4 mg Intravenous Given 07/09/24 1656)  metoCLOPramide  (REGLAN ) injection 5 mg (5 mg Intravenous Given 07/09/24 1656)  iohexol  (OMNIPAQUE ) 300 MG/ML solution 100 mL (100 mLs Intravenous Contrast  Given 07/09/24 1706)  thiamine  (VITAMIN B1) tablet 100 mg (100 mg Oral Given 07/09/24 1852)  folic acid  (FOLVITE ) tablet 1 mg (1 mg Oral Given 07/09/24 1852)  acetaminophen  (TYLENOL ) tablet 1,000 mg (1,000 mg Oral Given 07/09/24 2218)    Clinical Course as of 07/09/24 2334  Sun Jul 09, 2024  1757 Hemoglobin(!): 9.8 Similar to baseline [AY]  1757 Chronically elevated LFTs [AY]  2025 Consulted Bergman, NP on call with neurosurgery -advised can place patient in a brace but these are nonemergent and nonweightbearing fractures.  He does not need follow-up. [AY]    Clinical Course User Index [AY] Gabriel Thersia RAMAN, PA-C                                Medical Decision Making Patient is a 59 year old male with history of GERD, hypertension, tobacco abuse, alcohol abuse, and recurrent falls who presents to the ED for left-sided pain after a fall 3 days ago.  Also notes nausea/vomiting/diarrhea over the weekend.  States he has not drank alcohol since Friday.  Please see detailed HPI above.  On exam patient is alert and anxious.  Physical exam as noted above.  He has mild shaking and pain on the left side but no acute neurologic deficits.  Hematoma noted to the left flank.  He was also noted to have wounds to the left toes, was seen in the ED 1 week ago but has not followed up.  Lipase mildly elevated at 65.  Anemia similar to baseline.  Hyponatremia 129 that is also similar to his previous.  Fluids have been started.  Initial lactic 2.7, second  2.1.  Suspect most likely secondary to alcohol consumption.  Patient is very anxious and continues to be mildly shaking in his hands on reevaluation.  Suspect he does have some withdrawal.  He was given thiamine  and folic acid , levels have been drawn as well.  CT of the head and neck today reviewed and negative for acute process.  CT of the chest abdomen pelvis shows subcu soft tissue stranding in the left flank likely resembling soft tissue contusion with hematoma.  This is consistent with his physical exam.  Also noted to have acute to subacute right L1 transverse process fracture and multiple chronic rib fractures.  The L1 fracture was discussed with neurosurgery on-call who advised can place in a brace but this would be nonsurgical and would not need follow-up as this is a nonweightbearing fracture.  Please see media imaging for characterization of toes.  Unfortunately the wounds on the toes do appear to be worse but there is no underlying osteomyelitis on x-ray today.  Patient unfortunately is noncompliant and high risk.  He is also noted to most likely have alcohol withdrawal at this time and is high risk for decompensation.  We do feel admission for further evaluation and management would be beneficial.  He has been continued on fluids.  Hospitalist has been consulted and are agreeable to admit patient for further evaluation and management.  Patient is stable while in ED.   Amount and/or Complexity of Data Reviewed Labs: ordered. Decision-making details documented in ED Course. Radiology: ordered.  Risk OTC drugs. Prescription drug management. Decision regarding hospitalization.       Final diagnoses:  Alcohol withdrawal syndrome without complication (HCC)  Tachycardia  Fall, initial encounter  Left flank pain  Lactic acidosis  Open wound of toe, initial encounter  ED Discharge Orders     None          Gabriel Thersia GORMAN DEVONNA 07/09/24 2334    Gabriel Elsie CROME,  MD 07/09/24 2336  "

## 2024-07-09 NOTE — ED Notes (Signed)
 Pt assisted to Bathroom X1 assist with cane

## 2024-07-09 NOTE — H&P (Signed)
 " History and Physical    Gabriel Koch FMW:[id number] DOB: [birth year] DOA: 07/09/2024  PCP: Katrinka Aquas, MD   Patient coming from: Home  I have personally briefly reviewed patient's old medical records in [facility]  Chief Complaint: Falls  HPI: Gabriel Koch is a 59 y.o. male with medical history significant for alcohol abuse, hypertension, diabetes mellitus, stroke. Patient presented to the ED with complaints of multiple episodes of nausea vomiting and diarrhea that started 2 days ago.  Reports poor oral intake, and intermittent pain with urination.  Patient reports his last alcoholic beverage was 2 days ago 1/2.  He also may he drinks 4-6 beers daily.  Now he feels he is in withdrawal, with tremors, he has had episodes of hallucination in the past with withdrawal.  He has never had seizures. He reports a fall 2 days ago also while he was intoxicated, he fell onto his left side.  Reports burning pain to his upper abdomen.  He was in the ED 12/27, with a wound to his left toe, .  X-ray of his left foot was negative for radiographic evidence of osteomyelitis.  He tells me he was prescribed antibiotics 7-day course he took twice daily and completed the course.  ED notes from that day do not document antibiotic. He has persistent pain to his left foot, but no redness or swelling or discharge, and wound is scabbed over but not worse.  ED Course: Temperature 97.9.  Heart rate 91-107.  Respiratory rate 18-29.  Blood pressure systolic 130-144.  O2 sats 91- 100 percent on room air. Mild elevated liver enzymes AST 148, ALT 62, ALP 308, T. bili 1.7. Sodium 129. Lactic acid 2.7 > 2.1. CT chest abdomen pelvis with contrast shows likely soft tissue contusion with hematoma left flank.  Acute to subacute right L1 transverse process fracture.  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  Several old rib fractures.  Mesenteric stranding in gallbladder fossa. 1 L bolus  given. Hospitalist to admit for alcohol withdrawal.  Review of Systems: As per HPI all other systems reviewed and negative.  Past Medical History:  Diagnosis Date   Alcohol abuse    Anxiety and depression    Asthma    Blood transfusion    Complication of anesthesia    patient hemorrrhaged about 2 weeks after tonsillecotmy   COPD (chronic obstructive pulmonary disease) (HCC)    Diabetes mellitus without complication (HCC)    GERD (gastroesophageal reflux disease)    Hypertension    PTSD (post-traumatic stress disorder)    Stroke (HCC) 2021    Past Surgical History:  Procedure Laterality Date   APPENDECTOMY     BIOPSY  04/02/2020   Procedure: BIOPSY;  Surgeon: Cindie Carlin POUR, DO;  [facility];  Service: Endoscopy;;   BIOPSY  03/18/2022   Procedure: BIOPSY;  Surgeon: Cindie Carlin POUR, DO;  [facility];  Service: Endoscopy;;   BIOPSY  03/22/2023   Procedure: BIOPSY;  Surgeon: Cindie Carlin POUR, DO;  [facility];  Service: Endoscopy;;   COLONOSCOPY WITH PROPOFOL  N/A 04/02/2020   Procedure: COLONOSCOPY WITH PROPOFOL ;  Surgeon: Cindie Carlin POUR, DO;  [facility];  Service: Endoscopy;  Laterality: N/A;  8:30am   COLONOSCOPY WITH PROPOFOL  N/A 03/18/2022   Surgeon: Cindie Carlin POUR, DO; Nonbleeding internal hemorrhoids, multiple diverticula in the sigmoid colon, segmental area of moderately friable mucosa with spontaneous bleeding in the transverse and ascending colon biopsied.  Pathology with no specific histopathologic change. Recommended 5 year surveillance.   COLONOSCOPY WITH PROPOFOL  N/A 03/22/2023   Nonbleeding internal hemorrhoids, multiple sigmoid diverticula, normal TI s/p biopsy, localized area of mildly friable mucosa with contact bleeding in ascending colon and cecum.  Biopsies taken from entire colon.  TI biopsy benign.  Mild reactive/reparative change in in [facility] and TC.  Minimal increased intraepithelial lymphocytes and  descending colon, nonspecific.  Sigmoid and rectum biopsy benign.   ESOPHAGOGASTRODUODENOSCOPY (EGD) WITH PROPOFOL  N/A 04/02/2020   Procedure: ESOPHAGOGASTRODUODENOSCOPY (EGD) WITH PROPOFOL ;  Surgeon: Cindie Carlin POUR, DO;  [facility];  Service: Endoscopy;  Laterality: N/A;   ESOPHAGOGASTRODUODENOSCOPY (EGD) WITH PROPOFOL  N/A 03/21/2021   Procedure: ESOPHAGOGASTRODUODENOSCOPY (EGD) WITH PROPOFOL ;  Surgeon: Cindie Carlin POUR, DO;  [facility];  Service: Endoscopy;  Laterality: N/A;  11:00am   ESOPHAGOGASTRODUODENOSCOPY (EGD) WITH PROPOFOL  N/A 03/18/2022   3 cm hiatal hernia, short segment Barrett's biopsied, gastritis biopsied, duodenal scar/?  Residual polyp tissue that was resected.  Pathology with mild nonspecific reactive gastropathy, negative for H. 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  Surveillance in 5 years.   POLYPECTOMY  04/02/2020   Procedure: POLYPECTOMY;  Surgeon: Cindie Carlin POUR, DO;  [facility];  Service: Endoscopy;;   POLYPECTOMY  03/21/2021   Procedure: POLYPECTOMY;  Surgeon: Cindie Carlin POUR, DO;  [facility];  Service: Endoscopy;;  duodenal   POLYPECTOMY  03/18/2022   Procedure: POLYPECTOMY;  Surgeon: Cindie Carlin POUR, DO;  [facility];  Service: Endoscopy;;   REPAIR EXTENSOR TENDON Left 08/06/2022   Procedure: LEFT LONG FINGER EXTENSOR TENDON CENTRALIZATION;  Surgeon: Murrell Drivers, MD;  Location: [facility];  Service: Orthopedics;  Laterality: Left;  60 MIN   STERIOD INJECTION Left 08/06/2022   Procedure: LEFT CARPAL TUNNEL INJECTION;  Surgeon: Murrell Drivers, MD;  Location: [facility];  Service: Orthopedics;  Laterality: Left;   TONSILLECTOMY       reports that he has been smoking cigars and cigarettes. He has a 30 pack-year smoking history. He has never used smokeless tobacco. He reports current alcohol use of about 2.0 standard drinks of  alcohol per week. He reports that he does not use drugs.  Allergies[1]  Family History  Problem Relation Age of Onset   Colon cancer Neg Hx        Pt knows little about family history    Prior to Admission medications  Medication Sig Start Date End Date Taking? Authorizing Provider  acetaminophen  (TYLENOL ) 500 MG tablet Take 1,000 mg by mouth every 6 (six) hours as needed for mild pain (pain score 1-3).    [provider]  albuterol  (VENTOLIN  HFA) 108 (90 Base) MCG/ACT inhaler Inhale 1-2 puffs into the lungs every 6 (six) hours as needed for wheezing or shortness of breath.    [provider]  amLODipine  (NORVASC ) 5 MG tablet Take 5 mg by mouth daily.    [provider]  amoxicillin -clavulanate (AUGMENTIN ) 875-125 MG tablet Take 1 tablet by mouth every 12 (twelve) hours. 07/01/24   Suellen Cantor A, PA-C  atorvastatin  (LIPITOR) 40 MG tablet Take 40 mg by mouth daily.    [provider]  buPROPion  (WELLBUTRIN ) 75 MG tablet Take 150 mg by mouth 2 (two) times daily.    [provider]  busPIRone  (BUSPAR ) 10 MG tablet Take 10 mg by mouth 2 (two) times daily.    [provider]  cephALEXin  (KEFLEX )  500 MG capsule Take 1 capsule (500 mg total) by mouth 4 (four) times daily. 02/15/24   Haze Lonni PARAS, MD  esomeprazole  (NEXIUM ) 40 MG capsule TAKE ONE CAPSULE BY MOUTH TWICE DAILY BEFORE meals 04/20/24   Ezzard Sonny RAMAN, PA-C  folic acid  (FOLVITE ) 1 MG tablet Take 1 mg by mouth daily.    [provider]  furosemide  (LASIX ) 20 MG tablet Take 1 tablet (20 mg total) by mouth daily. 02/15/24   Haze Lonni PARAS, MD  gabapentin  (NEURONTIN ) 100 MG capsule Take 100 mg by mouth 3 (three) times daily.    [provider]  meloxicam  (MOBIC ) 7.5 MG tablet Take 1 tablet (7.5 mg total) by mouth daily as needed for pain. 02/04/24   Vicci Afton CROME, MD  Multiple Vitamin (MULTIVITAMIN WITH MINERALS) TABS tablet Take 1 tablet by mouth  daily. 02/05/24   Johnson, Clanford L, MD  thiamine  (VITAMIN B-1) 100 MG tablet Take 1 tablet (100 mg total) by mouth daily. 02/05/24   Johnson, Clanford L, MD  tiotropium (SPIRIVA ) 18 MCG inhalation capsule Place 18 mcg into inhaler and inhale daily.    [provider]  traZODone  (DESYREL ) 100 MG tablet Take 100 mg by mouth at bedtime.    [provider]  zolpidem (AMBIEN) 10 MG tablet Take 10 mg by mouth at bedtime.    [provider]    Physical Exam: Vitals:   07/09/24 2058 07/09/24 2100 07/09/24 2100 07/09/24 2130  BP: (!) 143/75 (!) 145/79 (!) 143/75 134/80  Pulse: 92 91 92 (!) 103  Resp:  18  18  Temp:    98.2 F (36.8 C)  TempSrc:      SpO2: 98% 99%  91%  Weight:      Height:        Constitutional: Chronically ill-appearing, mildly tremulous , mildly irritable Vitals:   07/09/24 2058 07/09/24 2100 07/09/24 2100 07/09/24 2130  BP: (!) 143/75 (!) 145/79 (!) 143/75 134/80  Pulse: 92 91 92 (!) 103  Resp:  18  18  Temp:    98.2 F (36.8 C)  TempSrc:      SpO2: 98% 99%  91%  Weight:      Height:       Eyes: PERRL, lids and conjunctivae normal ENMT: Mucous membranes are moist.  Neck: normal, supple, no masses, no thyromegaly Respiratory: clear to auscultation bilaterally, no wheezing, no crackles. Normal respiratory effort. No accessory muscle use.  Cardiovascular: Regular rate and rhythm, no murmurs / rubs / gallops. No extremity edema.  Abdomen: no tenderness, soft, no masses palpated. No hepatosplenomegaly.  Musculoskeletal: no clubbing / cyanosis. No joint deformity upper and lower extremities.  Skin: Scabbed over wound to medial aspect of second toe and lateral aspect of first toe, interdigital cleft.  No erythema, no sign of infection, no drainage, no swelling, no differential warmth,  Neurologic: No facial asymmetry, moving extremities spontaneously.  Tremor to right upper extremity.SABRA  Psychiatric: Normal judgment and insight. Alert and  oriented x 3. Normal mood.   Labs on Admission: I have personally reviewed following labs and imaging studies  CBC: Recent Labs  Lab 07/09/24 1602  WBC 6.7  NEUTROABS 5.2  HGB 9.8*  HCT 28.3*  MCV 108.0*  PLT 164   Basic Metabolic Panel: Recent Labs  Lab 07/09/24 1602  NA 129*  K 3.7  CL 93*  CO2 22  GLUCOSE 110*  BUN 6  CREATININE 0.99  CALCIUM  9.0   GFR: Estimated Creatinine  Clearance: 66.7 mL/min (by C-G formula based on SCr of 0.99 mg/dL). Liver Function Tests: Recent Labs  Lab 07/09/24 1602  AST 146*  ALT 62*  ALKPHOS 308*  BILITOT 1.7*  PROT 7.5  ALBUMIN 4.0   Recent Labs  Lab 07/09/24 1602  LIPASE 65*   Anemia Panel: Recent Labs    07/09/24 1852  FOLATE >20.0   Urine analysis:    Component Value Date/Time   COLORURINE AMBER (A) 07/09/2024 1658   APPEARANCEUR CLEAR 07/09/2024 1658   APPEARANCEUR Clear 03/09/2013 2142   LABSPEC 1.024 07/09/2024 1658   LABSPEC 1.002 03/09/2013 2142   PHURINE 5.0 07/09/2024 1658   GLUCOSEU NEGATIVE 07/09/2024 1658   GLUCOSEU Negative 03/09/2013 2142   HGBUR MODERATE (A) 07/09/2024 1658   BILIRUBINUR NEGATIVE 07/09/2024 1658   BILIRUBINUR Negative 03/09/2013 2142   KETONESUR NEGATIVE 07/09/2024 1658   PROTEINUR 100 (A) 07/09/2024 1658   NITRITE NEGATIVE 07/09/2024 1658   LEUKOCYTESUR NEGATIVE 07/09/2024 1658   LEUKOCYTESUR Negative 03/09/2013 2142    Radiological Exams on Admission: CT CHEST ABDOMEN PELVIS W CONTRAST Result Date: 07/09/2024 CLINICAL DATA:  Several day history of nausea, vomiting, and diarrhea. Status post fall onto left side EXAM: CT CHEST, ABDOMEN, AND PELVIS WITH CONTRAST TECHNIQUE: Multidetector CT imaging of the chest, abdomen and pelvis was performed following the standard protocol during bolus administration of intravenous contrast. RADIATION DOSE REDUCTION: This exam was performed according to the departmental dose-optimization program which includes automated exposure control,  adjustment of the mA and/or kV according to patient size and/or use of iterative reconstruction technique. CONTRAST:  OMNIPAQUE  IOHEXOL  300 MG/ML  SOLN COMPARISON:  CT chest, abdomen, and pelvis dated 11/02/2023 FINDINGS: CT CHEST FINDINGS Cardiovascular: Normal heart size. No significant pericardial fluid/thickening. Great vessels are normal in course and caliber. No central pulmonary emboli. Coronary artery calcifications. Mediastinum/Nodes: Imaged thyroid  gland without nodules meeting criteria for imaging follow-up by size. Normal esophagus. No pathologically enlarged axillary, supraclavicular, mediastinal, or hilar lymph nodes. Lungs/Pleura: The central airways are patent. No focal consolidation. No suspicious pulmonary nodules. No pneumothorax. No pleural effusion. Musculoskeletal: Healing left anterolateral third through fifth rib fractures. 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 CT ABDOMEN PELVIS FINDINGS Hepatobiliary: Diffuse parenchymal hypoattenuation can be seen with hepatic steatosis. Subcentimeter segment 3 hypodensity (2:68) and 6 hypodensity (2:65), too small to characterize. No intra or extrahepatic biliary ductal dilation. Normal gallbladder. Mild mesenteric stranding in the region of the gallbladder fossa. Pancreas: No focal lesions or main ductal dilation. Spleen: Normal in size without focal abnormality. Adrenals/Urinary Tract: No adrenal nodules. No suspicious renal mass, calculi, or hydronephrosis. Underdistended urinary bladder. Stomach/Bowel: Normal appearance of the stomach. No evidence of bowel wall thickening, distention, or inflammatory changes. Appendectomy. Vascular/Lymphatic: Aortic atherosclerosis. No enlarged abdominal or pelvic lymph nodes. Reproductive: Prostate is unremarkable. Other: Small volume lower abdominal and pelvic free fluid measures simple attenuation. Musculoskeletal: Acute to subacute right L1 transverse process fracture (2:67).  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 Vertically oriented lucency through the left L5 pedicle (9:103) appears unchanged compared to 11/02/2023 and may represent a vascular channel. Subcutaneous soft tissue stranding in the left flank with ill-defined hyper attenuation. IMPRESSION: 1. Subcutaneous soft tissue stranding in the left flank with ill-defined hyper attenuation, likely representing a soft tissue contusion with hematoma. 2. Acute to subacute right L1 transverse process fracture. 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 3. Healing left anterolateral third through fifth rib fractures. Old nonunited left posterolateral sixth  and seventh rib fractures. 4. Mesenteric stranding in the gallbladder fossa and small volume lower abdominal and pelvic free fluid, likely reactive. 5. Hepatic steatosis. 6.  Aortic Atherosclerosis (ICD10-I70.0). Electronically Signed   By: Limin  Xu M.D.   On: 07/09/2024 17:50   CT Cervical Spine Wo Contrast Result Date: 07/09/2024 CLINICAL DATA:  Fall EXAM: CT CERVICAL SPINE WITHOUT CONTRAST TECHNIQUE: Multidetector CT imaging of the cervical spine was performed without intravenous contrast. Multiplanar CT image reconstructions were also generated. RADIATION DOSE REDUCTION: This exam was performed according to the departmental dose-optimization program which includes automated exposure control, adjustment of the mA and/or kV according to patient size and/or use of iterative reconstruction technique. COMPARISON:  CT 11/02/2023 FINDINGS: Alignment: Facet alignment is normal. Straightening of the cervical spine. No subluxation Skull base and vertebrae: No acute fracture. No primary bone lesion or focal pathologic process. Soft tissues and spinal canal: No prevertebral fluid or swelling. No visible canal hematoma. Disc levels: Moderate disc space narrowing C5-C6 and mild to moderate narrowing at C6-C7. Posterior disc osteophyte complex at C5-C6 with mild mass  effect on the ventral thecal sac. Left foraminal narrowing at C5-C6. Upper chest: Mild emphysema Other: None IMPRESSION: Straightening of the cervical spine with degenerative changes. No acute osseous abnormality. Electronically Signed   By: Luke Bun M.D.   On: 07/09/2024 17:40   CT Head Wo Contrast Result Date: 07/09/2024 CLINICAL DATA:  Fall EXAM: CT HEAD WITHOUT CONTRAST TECHNIQUE: Contiguous axial images were obtained from the base of the skull through the vertex without intravenous contrast. RADIATION DOSE REDUCTION: This exam was performed according to the departmental dose-optimization program which includes automated exposure control, adjustment of the mA and/or kV according to patient size and/or use of iterative reconstruction technique. COMPARISON:  CT brain 11/02/2023, 02/02/2024 FINDINGS: Brain: No acute territorial infarction, hemorrhage or intracranial mass. Age advanced atrophy and chronic small vessel ischemic changes of the white matter. 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 Nonenlarged ventricles Vascular: No hyperdense vessels.  Carotid vascular calcification. Skull: Normal. Negative for fracture or focal lesion. Sinuses/Orbits: No acute finding. Other: None IMPRESSION: 1. No CT evidence for acute intracranial abnormality. 2. Age advanced atrophy and chronic small vessel ischemic changes of the white matter. Electronically Signed   By: Luke Bun M.D.   On: 07/09/2024 17:36   DG Foot 2 Views Left Result Date: 07/09/2024 CLINICAL DATA:  Left great toe wound and fall EXAM: LEFT FOOT - 2 VIEW COMPARISON:  July 01, 2024 FINDINGS: There is no evidence of fracture or dislocation. There is no evidence of arthropathy or other focal bone abnormality. Soft tissues are unremarkable. IMPRESSION: Negative. Electronically Signed   By: Lynwood Landy Raddle M.D.   On: 07/09/2024 17:04   EKG: Pending   Assessment/Plan Principal Problem:   Alcohol withdrawal (HCC) Active  Problems:   Hyponatremia   Falls   Alcohol abuse   HTN (hypertension)   Diarrhea  Assessment and Plan:  Alcohol withdrawal-mildly tremulous on exam R> L, tachycardic heart rate 91-107.  Reports history of hallucinations, not present at this time.  No history of seizures.  CIWA score at this time- 13.  Last alcoholic beverage was 2 days ago 07/07/24.  Drinks 4-6 beers daily.  Blood alcohol level negative.  Lactic acidosis 2.7 >> 2.1 - CIWA as needed and scheduled - 1 L bolus given, continue N/S 100cc/hr x 20hrs - Thiamine , folate, multivitamins  Gastroenteritis type symptoms-lactic acid 2.7 > 2.1.  Afebrile.  WBC  6.7.  Abdomen benign.  Reports intermittent dysuria, UA not suggestive of UTI. - Stool C. difficile, GI pathogen panel - Hydrate - Clear liquid diet - Advance diet as tolerated  Hyponatremia sodium 129.  Chronic. Recent baseline 127-134.  Mechanical fall secondary to alcohol intoxication- CT chest abdomen pelvis with contrast shows likely soft tissue contusion with hematoma left flank.  Acute to subacute right L1 transverse process fracture.  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  Several old rib fractures.  Mesenteric stranding in gallbladder fossa-likely reactive. - EDP talked to neurosurgery, TLSO, PT for back.  Does not need outpatient follow-up for this.  Alcoholic liver disease-elevated liver enzymes AST 146, ALT 62, ALP 308, T. bili 1.7.  Pattern consistent with alcoholic liver disease.  CT abdomen shows hepatic steatosis.  Reports epigastric pain. - IV Protonix  40 daily  Healing wound to left foot-does not appear infected.  Was in the ED 12/27 for same, given a 7-day course of Augmentin  which he says he has completed. - Monitor for now  Diabetes mellitus-diet controlled - CBG q8h   DVT prophylaxis: Lovenox  Code Status: FULL Family Communication: None at bedside Disposition Plan: ~  2days Consults called: None Admission status: Inpt Stepdown I  certify that at the point of admission it is my clinical judgment that the patient will require inpatient hospital care spanning beyond 2 midnights from the point of admission due to high intensity of service, high risk for further deterioration and high frequency of surveillance required.   Author: Tully FORBES Carwin, MD 07/09/2024 10:51 PM  For on call review [email or web address].     [1]  Allergies Allergen Reactions   Bee Venom Anaphylaxis   "

## 2024-07-09 NOTE — ED Triage Notes (Signed)
 Pt c/o n/v/d/ since Friday. He fell on left side Friday and c/o pain all on the left side.

## 2024-07-09 NOTE — ED Notes (Signed)
 [facility] called for TLSO brace

## 2024-07-10 LAB — HEMOGLOBIN A1C
Hgb A1c MFr Bld: 4.4 % — ABNORMAL LOW (ref 4.8–5.6)
Mean Plasma Glucose: 79.58 mg/dL

## 2024-07-10 LAB — BASIC METABOLIC PANEL WITH GFR
Anion gap: 9 (ref 5–15)
BUN: 5 mg/dL — ABNORMAL LOW (ref 6–20)
CO2: 23 mmol/L (ref 22–32)
Calcium: 8.1 mg/dL — ABNORMAL LOW (ref 8.9–10.3)
Chloride: 99 mmol/L (ref 98–111)
Creatinine, Ser: 0.75 mg/dL (ref 0.61–1.24)
GFR, Estimated: >60 mL/min
Glucose, Bld: 86 mg/dL (ref 70–99)
Potassium: 3.2 mmol/L — ABNORMAL LOW (ref 3.5–5.1)
Sodium: 131 mmol/L — ABNORMAL LOW (ref 135–145)

## 2024-07-10 LAB — CBC
HCT: 22.9 % — ABNORMAL LOW (ref 39.0–52.0)
Hemoglobin: 7.8 g/dL — ABNORMAL LOW (ref 13.0–17.0)
MCH: 37.5 pg — ABNORMAL HIGH (ref 26.0–34.0)
MCHC: 34.1 g/dL (ref 30.0–36.0)
MCV: 110.1 fL — ABNORMAL HIGH (ref 80.0–100.0)
Platelets: 100 K/uL — ABNORMAL LOW (ref 150–400)
RBC: 2.08 MIL/uL — ABNORMAL LOW (ref 4.22–5.81)
RDW: 13.4 % (ref 11.5–15.5)
WBC: 2.9 K/uL — ABNORMAL LOW (ref 4.0–10.5)
nRBC: 0 % (ref 0.0–0.2)

## 2024-07-10 LAB — GLUCOSE, CAPILLARY
Glucose-Capillary: 97 mg/dL (ref 70–99)
Glucose-Capillary: 135 mg/dL — ABNORMAL HIGH (ref 70–99)
Glucose-Capillary: 113 mg/dL — ABNORMAL HIGH (ref 70–99)

## 2024-07-10 LAB — CBG MONITORING, ED: Glucose-Capillary: 98 mg/dL (ref 70–99)

## 2024-07-10 MED ORDER — ENOXAPARIN SODIUM 40 MG/0.4ML IJ SOSY
40.0000 mg | PREFILLED_SYRINGE | INTRAMUSCULAR | Status: DC
  Administered 2024-07-10 – 2024-07-15 (×6): 40 mg via SUBCUTANEOUS
  Filled 2024-07-10 (×6): qty 0.4

## 2024-07-10 MED ORDER — MELATONIN 3 MG PO TABS
6.0000 mg | ORAL_TABLET | Freq: Every evening | ORAL | Status: DC | PRN
  Administered 2024-07-10 – 2024-07-13 (×4): 6 mg via ORAL
  Filled 2024-07-10 (×4): qty 2

## 2024-07-10 MED ORDER — SUCRALFATE 1 GM/10ML PO SUSP
1.0000 g | Freq: Three times a day (TID) | ORAL | Status: DC
  Administered 2024-07-10 – 2024-07-15 (×19): 1 g via ORAL
  Filled 2024-07-10 (×18): qty 10

## 2024-07-10 MED ORDER — PANTOPRAZOLE SODIUM 40 MG IV SOLR
40.0000 mg | Freq: Two times a day (BID) | INTRAVENOUS | Status: DC
  Administered 2024-07-10 – 2024-07-15 (×12): 40 mg via INTRAVENOUS
  Filled 2024-07-10 (×12): qty 10

## 2024-07-10 MED ORDER — BOOST / RESOURCE BREEZE PO LIQD CUSTOM
1.0000 | Freq: Three times a day (TID) | ORAL | Status: DC
  Administered 2024-07-10 – 2024-07-13 (×7): 1 via ORAL

## 2024-07-10 MED ORDER — VITAMIN B-12 1000 MCG PO TABS
1000.0000 ug | ORAL_TABLET | Freq: Every day | ORAL | Status: DC
  Administered 2024-07-10 – 2024-07-15 (×6): 1000 ug via ORAL
  Filled 2024-07-10 (×6): qty 1

## 2024-07-10 MED ORDER — SODIUM CHLORIDE 0.9 % IV SOLN: INTRAVENOUS | Status: AC

## 2024-07-10 MED ORDER — PROCHLORPERAZINE EDISYLATE 10 MG/2ML IJ SOLN
5.0000 mg | Freq: Four times a day (QID) | INTRAMUSCULAR | Status: DC | PRN
  Administered 2024-07-11 – 2024-07-15 (×5): 5 mg via INTRAVENOUS
  Filled 2024-07-10 (×5): qty 2

## 2024-07-10 MED ORDER — ACETAMINOPHEN 325 MG PO TABS
650.0000 mg | ORAL_TABLET | Freq: Four times a day (QID) | ORAL | Status: DC | PRN
  Administered 2024-07-10 – 2024-07-14 (×8): 650 mg via ORAL
  Filled 2024-07-10 (×8): qty 2

## 2024-07-10 MED ORDER — POLYETHYLENE GLYCOL 3350 17 G PO PACK: 17.0000 g | PACK | Freq: Every day | ORAL | Status: DC | PRN

## 2024-07-10 MED ORDER — SODIUM CHLORIDE 0.9 % IV SOLN
12.5000 mg | Freq: Four times a day (QID) | INTRAVENOUS | Status: DC | PRN
  Administered 2024-07-13 – 2024-07-14 (×2): 12.5 mg via INTRAVENOUS
  Filled 2024-07-10: qty 0.5

## 2024-07-10 MED ORDER — ACETAMINOPHEN 650 MG RE SUPP: 650.0000 mg | Freq: Four times a day (QID) | RECTAL | Status: DC | PRN

## 2024-07-10 MED ORDER — POTASSIUM CHLORIDE CRYS ER 20 MEQ PO TBCR
40.0000 meq | EXTENDED_RELEASE_TABLET | Freq: Once | ORAL | Status: AC
  Administered 2024-07-10: 40 meq via ORAL
  Filled 2024-07-10: qty 2

## 2024-07-10 NOTE — TOC Initial Note (Signed)
 Transition of Care [facility]) - Initial/Assessment Note    Patient Details  Name: Gabriel Koch MRN: [id number] Date of Birth: 11-09-1965  [facility] CM/SW Contact:    Sharlyne Stabs, RN Phone Number: 07/10/2024, 1:19 PM  Clinical Narrative:            Patient admitted with alcohol withdrawal. IPCM consulted for Substance abuse resources. Added to AVS. Patient has been given on previous admissions.  Patient reports his  last alcoholic beverage was 2 days ago 1/2.  He also may he drinks 4-6 beers daily, which is a decrease from last admission. Patient has the resources and can review at discharge.   Expected Discharge Plan: Home/Self Care Barriers to Discharge: Continued Medical Work up   Patient Goals and CMS Choice Patient states their goals for this hospitalization and ongoing recovery are:: return home   Prior Living Arrangements/Services     Patient language and need for interpreter reviewed:: Yes        Need for Family Participation in Patient Care: Yes (Comment) Care giver support system in place?: Yes (comment)   Criminal Activity/Legal Involvement Pertinent to Current Situation/Hospitalization: No - Comment as needed  Activities of Daily Living   ADL Screening (condition at time of admission) Independently performs ADLs?: Yes (appropriate for developmental age) Is the patient deaf or have difficulty hearing?: No Does the patient have difficulty seeing, even when wearing glasses/contacts?: No Does the patient have difficulty concentrating, remembering, or making decisions?: No  Permission Sought/Granted        Emotional Assessment     Affect (typically observed): Accepting Orientation: : Oriented to Situation, Oriented to  Time, Oriented to Place, Oriented to Self Alcohol / Substance Use: Alcohol Use Psych Involvement: No (comment)  Admission diagnosis:  Alcohol withdrawal (HCC) [F10.939] Lactic acidosis [E87.20] Tachycardia [R00.0] Left flank  pain [R10.A2] Fall, initial encounter [W19.XXXA] Open wound of toe, initial encounter [S91.109A] Alcohol withdrawal syndrome without complication (HCC) [F10.930] Patient Active Problem List   Diagnosis Date Noted   Alcohol withdrawal (HCC) 07/09/2024   RUQ pain 02/14/2024   Subarachnoid hemorrhage (HCC) 11/02/2023   Falls 11/02/2023   Alcohol intoxication 11/02/2023   Colitis 01/11/2023   Generalized abdominal pain 12/27/2022   Rib deformity 12/27/2022   Pancytopenia (HCC) 12/27/2022   Loss of weight 12/27/2022   Diarrhea 12/27/2022   Prolapsed internal hemorrhoids, grade 2 03/26/2022   Barrett's esophagus 03/03/2022   Hyponatremia 12/14/2021   Discoloration of skin of flank resembling ecchymosis 12/14/2021   Coronary artery calcification 12/14/2021   Tobacco abuse 04/30/2020   Alcohol abuse 04/30/2020   Noncompliance with diet and medication regimen 04/30/2020   Left arm numbness 04/29/2020   Prolonged QT interval 04/29/2020   Hypokalemia 04/29/2020   Hypomagnesemia 04/29/2020   Stroke (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 04/29/2020   Facial weakness 04/28/2020   HTN (hypertension) 01/20/2020   GERD (gastroesophageal reflux disease) 01/19/2020   Vomiting 01/19/2020   Rectal bleeding 01/19/2020   Alternating constipation and diarrhea 01/19/2020   PCP:  Katrinka Aquas, MD Pharmacy:   [facility] - [place], [place] - [address] [place] [address] Phone: [phone] Fax: [phone]  [facility] [address] [place] [address] Phone: [phone] Fax: [phone]     Social Drivers of Health (SDOH) Social History: SDOH Screenings   Food Insecurity: No Food Insecurity (07/09/2024)  Housing: Low Risk (07/09/2024)  Transportation Needs: No Transportation  Needs (07/09/2024)  Utilities: Not At Risk (07/09/2024)  Tobacco Use: High Risk (07/09/2024)   SDOH Interventions:      Readmission Risk Interventions    02/04/2024    1:42 PM 02/04/2024   11:35 AM  Readmission Risk Prevention Plan  Transportation Screening Complete Complete  Home Care Screening Complete Complete  Medication Review (RN CM) Complete Complete

## 2024-07-10 NOTE — Assessment & Plan Note (Addendum)
-   Mechanical fall associated with alcohol intoxication - Continue as needed analgesics - Physical therapy evaluation requested - Nondisplaced fractures appreciated on x-ray exam require surgical intervention.

## 2024-07-10 NOTE — Assessment & Plan Note (Signed)
-   Replete electrolytes and follow trend - Will check magnesium  levels.

## 2024-07-10 NOTE — Progress Notes (Signed)
"                                       TRH overnight cross coverage  Was notified of not having any stepdown beds and to consider Med/Tele bed placement.  Presented at bedside.  The patient is alert and oriented x 3.  Endorses nausea and epigastric pain.  Admits to taking Aleve  frequently for headaches.  States he takes 5 tablets of Aleve  per day when he suffers headaches, 3 in the morning and 2 at night.  Last alcoholic drink was on Friday, 3 days ago.  Vital signs are currently stable.  No significant tremors.  Lungs are clear to auscultation and he is in sinus rhythm.  On exam, the abdomen is tender at epigastric region with minimal palpation.  Added IV PPI Protonix  40 mg BID and added bed placement to Telemetry unit.  Recommend GI consultation in the AM for possible upper endoscopy.  Time: 15 minutes. "

## 2024-07-10 NOTE — Assessment & Plan Note (Addendum)
-   No further episodes reported - Continue to maintain adequate hydration - Follow electrolytes - Advance diet as tolerated.

## 2024-07-10 NOTE — Assessment & Plan Note (Signed)
-   With epigastric pain at time of admission and concern for alcoholic gastritis - Cessation counseling has been provided as mentioned above - PPI twice a day and Carafate  provided - Follow clinical response.

## 2024-07-10 NOTE — Assessment & Plan Note (Addendum)
-   Cessation counseling provided - Alcohol level less than 15 at time of admission. -As mentioned above continue CIWA protocol, thiamine , folic acid  vitamins.

## 2024-07-10 NOTE — Progress Notes (Addendum)
 " PROGRESS NOTE    Gabriel Koch  FMW:[id number] DOB: Jun 27, 1966 DOA: 07/09/2024  PCP: Katrinka Aquas, MD    Chief Complaint  Patient presents with   Nausea    Brief admission narrative:  As per H&P written by Dr. Pearlean on 07/09/2024 Gabriel Koch is a 59 y.o. male with medical history significant for alcohol abuse, hypertension, diabetes mellitus, stroke. Patient presented to the ED with complaints of multiple episodes of nausea vomiting and diarrhea that started 2 days ago.  Reports poor oral intake, and intermittent pain with urination.  Patient reports his last alcoholic beverage was 2 days ago 1/2.  He also may he drinks 4-6 beers daily.  Now he feels he is in withdrawal, with tremors, he has had episodes of hallucination in the past with withdrawal.  He has never had seizures. He reports a fall 2 days ago also while he was intoxicated, he fell onto his left side.  Reports burning pain to his upper abdomen.   He was in the ED 12/27, with a wound to his left toe, .  X-ray of his left foot was negative for radiographic evidence of osteomyelitis.  He tells me he was prescribed antibiotics 7-day course he took twice daily and completed the course.  ED notes from that day do not document antibiotic. He has persistent pain to his left foot, but no redness or swelling or discharge, and wound is scabbed over but not worse.   ED Course: Temperature 97.9.  Heart rate 91-107.  Respiratory rate 18-29.  Blood pressure systolic 130-144.  O2 sats 91- 100 percent on room air. Mild elevated liver enzymes AST 148, ALT 62, ALP 308, T. bili 1.7. Sodium 129. Lactic acid 2.7 > 2.1. CT chest abdomen pelvis with contrast shows likely soft tissue contusion with hematoma left flank.  Acute to subacute right L1 transverse process fracture.  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  Several old rib fractures.  Mesenteric stranding in gallbladder fossa. 1 L bolus given. Hospitalist to admit  for alcohol withdrawal.   Assessment & Plan: Assessment & Plan Alcohol withdrawal (HCC) - Cessation counseling provided - Current CIWA score 4-6 - Continue CIWA protocol monitoring on as needed lorazepam  - Continue thiamine , folic acid  and multivitamins. -Patient advance to keeping self well-hydrated -Follow clinical response. Hyponatremia - In the setting of alcohol abuse and dehydration - Has received fluid resuscitation - Will follow electrolytes trend. Falls - Mechanical fall associated with alcohol intoxication - Continue as needed analgesics - Physical therapy evaluation requested - Nondisplaced fractures appreciated on x-ray exam require surgical intervention. Alcohol abuse - Cessation counseling provided - Alcohol level less than 15 at time of admission. -As mentioned above continue CIWA protocol, thiamine , folic acid  vitamins. HTN (hypertension) - Continue to follow vital signs - Continue home antihypertensive agents. Diarrhea - No further episodes reported - Continue to maintain adequate hydration - Follow electrolytes - Advance diet as tolerated. GERD (gastroesophageal reflux disease) - With epigastric pain at time of admission and concern for alcoholic gastritis - Cessation counseling has been provided as mentioned above - PPI twice a day and Carafate  provided - Follow clinical response. Hypokalemia - Replete electrolytes and follow trend - Will check magnesium  levels.    DVT prophylaxis: Lovenox  Code Status: Full code Family Communication:  No family at bedside. Disposition:   Status is: Observation The patient remains OBS appropriate and will d/c before 2 midnights.   Consultants:  EDP curbside neurosurgery (TLSO when out  of bed, PT evaluation and as needed analgesic recommended).  Procedures:  See below for x-ray reports.  Antimicrobials: None   Subjective: No chest pain, no nausea, no vomiting.  Reports just mild discomfort in his  epigastric area.  Would like diet to be advanced.  CIWA score 4-6.  Objective: Vitals:   07/10/24 0800 07/10/24 1200 07/10/24 1313 07/10/24 1500  BP: (!) 144/80 (!) 156/97 (!) 141/78 132/79  Pulse: 85 92 90   Resp:  18 18   Temp:   98.6 F (37 C)   TempSrc:   Oral   SpO2:   98% 99%  Weight:      Height:        Intake/Output Summary (Last 24 hours) at 07/10/2024 1703 Last data filed at 07/10/2024 1500 Gross per 24 hour  Intake 3105.43 ml  Output --  Net 3105.43 ml   Filed Weights   07/09/24 1554 07/10/24 0145  Weight: 58 kg 55.9 kg    Examination:  General exam: Appears calm and in no major distress.  Complaining of lower back pain. Respiratory system: Good saturation on room air.  No using accessory muscles. Cardiovascular system: S1 & S2 heard, no rubs, no gallops, no JVD.  Rate controlled. Gastrointestinal system: Abdomen is nondistended, soft and nontender. No organomegaly or masses felt. Normal bowel sounds heard. Central nervous system: Alert and oriented. No focal neurological deficits. Extremities: Cyanosis or clubbing.  Left foot 1st and 2nd toe with internal wound/scab without swelling or active drainage.  Patient reports pain. Skin: No petechiae.  Bruises in his lower back appreciated on exam.  Tender to palpation. Psychiatry: Judgement and insight appear normal.  Flat affect.  Data Reviewed: I have personally reviewed following labs and imaging studies  CBC: Recent Labs  Lab 07/09/24 1602 07/10/24 0501  WBC 6.7 2.9*  NEUTROABS 5.2  --   HGB 9.8* 7.8*  HCT 28.3* 22.9*  MCV 108.0* 110.1*  PLT 164 100*    Basic Metabolic Panel: Recent Labs  Lab 07/09/24 1602 07/10/24 0501  NA 129* 131*  K 3.7 3.2*  CL 93* 99  CO2 22 23  GLUCOSE 110* 86  BUN 6 5*  CREATININE 0.99 0.75  CALCIUM  9.0 8.1*   GFR: Estimated Creatinine Clearance: 79.6 mL/min (by C-G formula based on SCr of 0.75 mg/dL).  Liver Function Tests: Recent Labs  Lab 07/09/24 1602  AST  146*  ALT 62*  ALKPHOS 308*  BILITOT 1.7*  PROT 7.5  ALBUMIN 4.0    CBG: Recent Labs  Lab 07/10/24 0016 07/10/24 0718 07/10/24 1614  GLUCAP 98 97 135*    No results found for this or any previous visit (from the past 240 hours).   Radiology Studies: CT CHEST ABDOMEN PELVIS W CONTRAST Result Date: 07/09/2024 CLINICAL DATA:  Several day history of nausea, vomiting, and diarrhea. Status post fall onto left side EXAM: CT CHEST, ABDOMEN, AND PELVIS WITH CONTRAST TECHNIQUE: Multidetector CT imaging of the chest, abdomen and pelvis was performed following the standard protocol during bolus administration of intravenous contrast. RADIATION DOSE REDUCTION: This exam was performed according to the departmental dose-optimization program which includes automated exposure control, adjustment of the mA and/or kV according to patient size and/or use of iterative reconstruction technique. CONTRAST:  OMNIPAQUE  IOHEXOL  300 MG/ML  SOLN COMPARISON:  CT chest, abdomen, and pelvis dated 11/02/2023 FINDINGS: CT CHEST FINDINGS Cardiovascular: Normal heart size. No significant pericardial fluid/thickening. Great vessels are normal in course and caliber. No central pulmonary emboli.  Coronary artery calcifications. Mediastinum/Nodes: Imaged thyroid  gland without nodules meeting criteria for imaging follow-up by size. Normal esophagus. No pathologically enlarged axillary, supraclavicular, mediastinal, or hilar lymph nodes. Lungs/Pleura: The central airways are patent. No focal consolidation. No suspicious pulmonary nodules. No pneumothorax. No pleural effusion. Musculoskeletal: Healing left anterolateral third through fifth rib fractures. 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 CT ABDOMEN PELVIS FINDINGS Hepatobiliary: Diffuse parenchymal hypoattenuation can be seen with hepatic steatosis. Subcentimeter segment 3 hypodensity (2:68) and 6 hypodensity (2:65), too small to characterize. No  intra or extrahepatic biliary ductal dilation. Normal gallbladder. Mild mesenteric stranding in the region of the gallbladder fossa. Pancreas: No focal lesions or main ductal dilation. Spleen: Normal in size without focal abnormality. Adrenals/Urinary Tract: No adrenal nodules. No suspicious renal mass, calculi, or hydronephrosis. Underdistended urinary bladder. Stomach/Bowel: Normal appearance of the stomach. No evidence of bowel wall thickening, distention, or inflammatory changes. Appendectomy. Vascular/Lymphatic: Aortic atherosclerosis. No enlarged abdominal or pelvic lymph nodes. Reproductive: Prostate is unremarkable. Other: Small volume lower abdominal and pelvic free fluid measures simple attenuation. Musculoskeletal: Acute to subacute right L1 transverse process fracture (2:67). 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 Vertically oriented lucency through the left L5 pedicle (9:103) appears unchanged compared to 11/02/2023 and may represent a vascular channel. Subcutaneous soft tissue stranding in the left flank with ill-defined hyper attenuation. IMPRESSION: 1. Subcutaneous soft tissue stranding in the left flank with ill-defined hyper attenuation, likely representing a soft tissue contusion with hematoma. 2. Acute to subacute right L1 transverse process fracture. 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 3. Healing left anterolateral third through fifth rib fractures. 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 4. Mesenteric stranding in the gallbladder fossa and small volume lower abdominal and pelvic free fluid, likely reactive. 5. Hepatic steatosis. 6.  Aortic Atherosclerosis (ICD10-I70.0). Electronically Signed   By: Limin  Xu M.D.   On: 07/09/2024 17:50   CT Cervical Spine Wo Contrast Result Date: 07/09/2024 CLINICAL DATA:  Fall EXAM: CT CERVICAL SPINE WITHOUT CONTRAST TECHNIQUE: Multidetector CT imaging of the cervical spine was performed without  intravenous contrast. Multiplanar CT image reconstructions were also generated. RADIATION DOSE REDUCTION: This exam was performed according to the departmental dose-optimization program which includes automated exposure control, adjustment of the mA and/or kV according to patient size and/or use of iterative reconstruction technique. COMPARISON:  CT 11/02/2023 FINDINGS: Alignment: Facet alignment is normal. Straightening of the cervical spine. No subluxation Skull base and vertebrae: No acute fracture. No primary bone lesion or focal pathologic process. Soft tissues and spinal canal: No prevertebral fluid or swelling. No visible canal hematoma. Disc levels: Moderate disc space narrowing C5-C6 and mild to moderate narrowing at C6-C7. Posterior disc osteophyte complex at C5-C6 with mild mass effect on the ventral thecal sac. Left foraminal narrowing at C5-C6. Upper chest: Mild emphysema Other: None IMPRESSION: Straightening of the cervical spine with degenerative changes. No acute osseous abnormality. Electronically Signed   By: Luke Bun M.D.   On: 07/09/2024 17:40   CT Head Wo Contrast Result Date: 07/09/2024 CLINICAL DATA:  Fall EXAM: CT HEAD WITHOUT CONTRAST TECHNIQUE: Contiguous axial images were obtained from the base of the skull through the vertex without intravenous contrast. RADIATION DOSE REDUCTION: This exam was performed according to the departmental dose-optimization program which includes automated exposure control, adjustment of the mA and/or kV according to patient size and/or use of iterative reconstruction technique. COMPARISON:  CT brain 11/02/2023, 02/02/2024 FINDINGS: Brain: No acute territorial  infarction, hemorrhage or intracranial mass. Age advanced atrophy and chronic small vessel ischemic changes of the white matter. 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 Nonenlarged ventricles Vascular: No hyperdense vessels.  Carotid vascular calcification. Skull: Normal. Negative  for fracture or focal lesion. Sinuses/Orbits: No acute finding. Other: None IMPRESSION: 1. No CT evidence for acute intracranial abnormality. 2. Age advanced atrophy and chronic small vessel ischemic changes of the white matter. Electronically Signed   By: Luke Bun M.D.   On: 07/09/2024 17:36   DG Foot 2 Views Left Result Date: 07/09/2024 CLINICAL DATA:  Left great toe wound and fall EXAM: LEFT FOOT - 2 VIEW COMPARISON:  July 01, 2024 FINDINGS: There is no evidence of fracture or dislocation. There is no evidence of arthropathy or other focal bone abnormality. Soft tissues are unremarkable. IMPRESSION: Negative. Electronically Signed   By: Lynwood Landy Raddle M.D.   On: 07/09/2024 17:04   Scheduled Meds:  enoxaparin  (LOVENOX ) injection  40 mg Subcutaneous Q24H   feeding supplement  1 Container Oral TID BM   folic acid   1 mg Oral Daily   LORazepam   0-4 mg Intravenous Q6H   Followed by   NOREEN ON 07/11/2024] LORazepam   0-4 mg Intravenous Q12H   multivitamin with minerals  1 tablet Oral Daily   pantoprazole  (PROTONIX ) IV  40 mg Intravenous BID   sucralfate   1 g Oral TID WC & HS   thiamine   100 mg Oral Daily   Or   thiamine   100 mg Intravenous Daily   Continuous Infusions:  sodium chloride  100 mL/hr at 07/10/24 1235   promethazine  (PHENERGAN ) injection (IM or IVPB)       LOS: 0 days    Time spent: 50 minutes   Eric Nunnery, MD [facility]   To contact the attending provider between 7A-7P or the covering provider during after hours 7P-7A, please log into the web site [email or web address] and access using universal [place] password for that web site. If you do not have the password, please call the hospital operator.  07/10/2024, 5:03 PM    "

## 2024-07-10 NOTE — Assessment & Plan Note (Addendum)
-   Cessation counseling provided - Current CIWA score 4-6 - Continue CIWA protocol monitoring on as needed lorazepam  - Continue thiamine , folic acid  and multivitamins. -Patient advance to keeping self well-hydrated -Follow clinical response.

## 2024-07-10 NOTE — Assessment & Plan Note (Addendum)
-   Continue to follow vital signs - Continue home antihypertensive agents.

## 2024-07-10 NOTE — Assessment & Plan Note (Addendum)
-   In the setting of alcohol abuse and dehydration - Has received fluid resuscitation - Will follow electrolytes trend.

## 2024-07-11 DIAGNOSIS — E119 Type 2 diabetes mellitus without complications: Secondary | ICD-10-CM | POA: Diagnosis present

## 2024-07-11 DIAGNOSIS — F4310 Post-traumatic stress disorder, unspecified: Secondary | ICD-10-CM | POA: Diagnosis present

## 2024-07-11 DIAGNOSIS — F10229 Alcohol dependence with intoxication, unspecified: Secondary | ICD-10-CM | POA: Diagnosis present

## 2024-07-11 DIAGNOSIS — Z6820 Body mass index (BMI) 20.0-20.9, adult: Secondary | ICD-10-CM | POA: Diagnosis not present

## 2024-07-11 DIAGNOSIS — E860 Dehydration: Secondary | ICD-10-CM | POA: Diagnosis present

## 2024-07-11 DIAGNOSIS — E872 Acidosis, unspecified: Secondary | ICD-10-CM | POA: Diagnosis present

## 2024-07-11 DIAGNOSIS — E43 Unspecified severe protein-calorie malnutrition: Secondary | ICD-10-CM | POA: Diagnosis present

## 2024-07-11 DIAGNOSIS — I10 Essential (primary) hypertension: Secondary | ICD-10-CM | POA: Diagnosis present

## 2024-07-11 DIAGNOSIS — K219 Gastro-esophageal reflux disease without esophagitis: Secondary | ICD-10-CM | POA: Diagnosis present

## 2024-07-11 DIAGNOSIS — W19XXXA Unspecified fall, initial encounter: Secondary | ICD-10-CM | POA: Diagnosis present

## 2024-07-11 DIAGNOSIS — Z91030 Bee allergy status: Secondary | ICD-10-CM | POA: Diagnosis not present

## 2024-07-11 DIAGNOSIS — K709 Alcoholic liver disease, unspecified: Secondary | ICD-10-CM | POA: Diagnosis present

## 2024-07-11 DIAGNOSIS — F10930 Alcohol use, unspecified with withdrawal, uncomplicated: Secondary | ICD-10-CM | POA: Diagnosis not present

## 2024-07-11 DIAGNOSIS — S3013XA Contusion of flank (latus) region, initial encounter: Secondary | ICD-10-CM | POA: Diagnosis present

## 2024-07-11 DIAGNOSIS — Z8673 Personal history of transient ischemic attack (TIA), and cerebral infarction without residual deficits: Secondary | ICD-10-CM | POA: Diagnosis not present

## 2024-07-11 DIAGNOSIS — Y900 Blood alcohol level of less than 20 mg/100 ml: Secondary | ICD-10-CM | POA: Diagnosis present

## 2024-07-11 DIAGNOSIS — R296 Repeated falls: Secondary | ICD-10-CM | POA: Diagnosis present

## 2024-07-11 DIAGNOSIS — E876 Hypokalemia: Secondary | ICD-10-CM | POA: Diagnosis present

## 2024-07-11 DIAGNOSIS — S32018A Other fracture of first lumbar vertebra, initial encounter for closed fracture: Secondary | ICD-10-CM | POA: Diagnosis present

## 2024-07-11 DIAGNOSIS — J449 Chronic obstructive pulmonary disease, unspecified: Secondary | ICD-10-CM | POA: Diagnosis present

## 2024-07-11 DIAGNOSIS — Z79899 Other long term (current) drug therapy: Secondary | ICD-10-CM | POA: Diagnosis not present

## 2024-07-11 DIAGNOSIS — F10230 Alcohol dependence with withdrawal, uncomplicated: Secondary | ICD-10-CM | POA: Diagnosis present

## 2024-07-11 DIAGNOSIS — F419 Anxiety disorder, unspecified: Secondary | ICD-10-CM | POA: Diagnosis present

## 2024-07-11 DIAGNOSIS — F10939 Alcohol use, unspecified with withdrawal, unspecified: Secondary | ICD-10-CM | POA: Diagnosis present

## 2024-07-11 DIAGNOSIS — E871 Hypo-osmolality and hyponatremia: Secondary | ICD-10-CM | POA: Diagnosis present

## 2024-07-11 DIAGNOSIS — F32A Depression, unspecified: Secondary | ICD-10-CM | POA: Diagnosis present

## 2024-07-11 DIAGNOSIS — R197 Diarrhea, unspecified: Secondary | ICD-10-CM | POA: Diagnosis present

## 2024-07-11 DIAGNOSIS — Z87891 Personal history of nicotine dependence: Secondary | ICD-10-CM | POA: Diagnosis not present

## 2024-07-11 LAB — BASIC METABOLIC PANEL WITH GFR
Anion gap: 11 (ref 5–15)
BUN: <5 mg/dL — ABNORMAL LOW (ref 6–20)
CO2: 21 mmol/L — ABNORMAL LOW (ref 22–32)
Calcium: 8.7 mg/dL — ABNORMAL LOW (ref 8.9–10.3)
Chloride: 101 mmol/L (ref 98–111)
Creatinine, Ser: 0.73 mg/dL (ref 0.61–1.24)
GFR, Estimated: >60 mL/min
Glucose, Bld: 85 mg/dL (ref 70–99)
Potassium: 3.7 mmol/L (ref 3.5–5.1)
Sodium: 133 mmol/L — ABNORMAL LOW (ref 135–145)

## 2024-07-11 LAB — GLUCOSE, CAPILLARY
Glucose-Capillary: 100 mg/dL — ABNORMAL HIGH (ref 70–99)
Glucose-Capillary: 104 mg/dL — ABNORMAL HIGH (ref 70–99)
Glucose-Capillary: 112 mg/dL — ABNORMAL HIGH (ref 70–99)
Glucose-Capillary: 94 mg/dL (ref 70–99)
Glucose-Capillary: 98 mg/dL (ref 70–99)

## 2024-07-11 LAB — MAGNESIUM: Magnesium: 1.4 mg/dL — ABNORMAL LOW (ref 1.7–2.4)

## 2024-07-11 LAB — CBC
HCT: 23.2 % — ABNORMAL LOW (ref 39.0–52.0)
Hemoglobin: 7.8 g/dL — ABNORMAL LOW (ref 13.0–17.0)
MCH: 37.3 pg — ABNORMAL HIGH (ref 26.0–34.0)
MCHC: 33.6 g/dL (ref 30.0–36.0)
MCV: 111 fL — ABNORMAL HIGH (ref 80.0–100.0)
Platelets: 101 K/uL — ABNORMAL LOW (ref 150–400)
RBC: 2.09 MIL/uL — ABNORMAL LOW (ref 4.22–5.81)
RDW: 13.3 % (ref 11.5–15.5)
WBC: 3.8 K/uL — ABNORMAL LOW (ref 4.0–10.5)
nRBC: 0 % (ref 0.0–0.2)

## 2024-07-11 MED ORDER — NICOTINE 21 MG/24HR TD PT24
21.0000 mg | MEDICATED_PATCH | Freq: Every day | TRANSDERMAL | Status: DC
  Administered 2024-07-11 – 2024-07-14 (×3): 21 mg via TRANSDERMAL
  Filled 2024-07-11 (×4): qty 1

## 2024-07-11 MED ORDER — SACCHAROMYCES BOULARDII 250 MG PO CAPS
250.0000 mg | ORAL_CAPSULE | Freq: Two times a day (BID) | ORAL | Status: DC
  Administered 2024-07-11 – 2024-07-15 (×8): 250 mg via ORAL
  Filled 2024-07-11 (×8): qty 1

## 2024-07-11 MED ORDER — FAMOTIDINE 20 MG PO TABS
20.0000 mg | ORAL_TABLET | Freq: Every day | ORAL | Status: DC
  Administered 2024-07-11 – 2024-07-14 (×4): 20 mg via ORAL
  Filled 2024-07-11 (×4): qty 1

## 2024-07-11 NOTE — Assessment & Plan Note (Signed)
-   Continue to follow electrolytes and further replete.

## 2024-07-11 NOTE — Assessment & Plan Note (Signed)
-   Patient reports overall improvement; will add Florastor to his regimen. - Continue to maintain adequate hydration - Follow electrolytes and replete as needed - Advance diet as tolerated.

## 2024-07-11 NOTE — Evaluation (Signed)
 Physical Therapy Evaluation Patient Details Name: Gabriel Koch MRN: [id number] DOB: 03-Oct-1965 Today's Date: 07/11/2024  History of Present Illness  Gabriel Koch is a 59 y.o. male with medical history significant for alcohol abuse, hypertension, diabetes mellitus, stroke.  Patient presented to the ED with complaints of multiple episodes of nausea vomiting and diarrhea that started 2 days ago.  Reports poor oral intake, and intermittent pain with urination.  Patient reports his last alcoholic beverage was 2 days ago 1/2.  He also may he drinks 4-6 beers daily.  Now he feels he is in withdrawal, with tremors, he has had episodes of hallucination in the past with withdrawal.  He has never had seizures.  He reports a fall 2 days ago also while he was intoxicated, he fell onto his left side.  Reports burning pain to his upper abdomen.     He was in the ED 12/27, with a wound to his left toe, .  X-ray of his left foot was negative for radiographic evidence of osteomyelitis.  He tells me he was prescribed antibiotics 7-day course he took twice daily and completed the course.  ED notes from that day do not document antibiotic.  He has persistent pain to his left foot, but no redness or swelling or discharge, and wound is scabbed over but not worse.   Clinical Impression  Patient functioning near baseline for functional mobility and gait demonstrating fair/good return for ambulating to bathroom, transferring to/from bed and commode in bathroom without loss of balance. Patient limited for out of due to c/o fatigue and requested to go back to bed. Patient will benefit from continued skilled physical therapy in hospital and recommended venue below to increase strength, balance, endurance for safe ADLs and gait.          If plan is discharge home, recommend the following: A little help with walking and/or transfers;A little help with bathing/dressing/bathroom;Help with stairs or ramp for entrance;Assist for  transportation;Assistance with cooking/housework   Can travel by private vehicle        Equipment Recommendations None recommended by PT  Recommendations for Other Services       Functional Status Assessment Patient has had a recent decline in their functional status and demonstrates the ability to make significant improvements in function in a reasonable and predictable amount of time.     Precautions / Restrictions Precautions Precautions: Fall Recall of Precautions/Restrictions: Intact Restrictions Weight Bearing Restrictions Per Provider Order: No      Mobility  Bed Mobility Overal bed mobility: Independent                  Transfers Overall transfer level: Modified independent                      Ambulation/Gait Ambulation/Gait assistance: Contact guard assist Gait Distance (Feet): 15 Feet Assistive device: Straight cane Gait Pattern/deviations: Decreased step length - right, Decreased step length - left, Decreased stride length, Step-to pattern Gait velocity: decreased     General Gait Details: slow slightly labored movement with mostly step-to pattern using his cane without loss of balance and limited mostly due to c/o fatigue  Stairs            Wheelchair Mobility     Tilt Bed    Modified Rankin (Stroke Patients Only)       Balance Overall balance assessment: Needs assistance Sitting-balance support: Feet supported, No upper extremity supported Sitting balance-Leahy Scale: Good Sitting  balance - Comments: seated at EOB   Standing balance support: During functional activity, Single extremity supported Standing balance-Leahy Scale: Fair Standing balance comment: fair/good using his SPC                             Pertinent Vitals/Pain Pain Assessment Pain Assessment: No/denies pain    Home Living Family/patient expects to be discharged to:: Private residence Living Arrangements: Alone Available Help at  Discharge: Other (Comment) Type of Home: House Home Access: Stairs to enter Entrance Stairs-Rails: None Entrance Stairs-Number of Steps: 2   Home Layout: One level Home Equipment: Cane - single Librarian, Academic (2 wheels)      Prior Function Prior Level of Function : Independent/Modified Independent;History of Falls (last six months)             Mobility Comments: household and short distanced community ambulation using SPC ADLs Comments: Independent     Extremity/Trunk Assessment   Upper Extremity Assessment Upper Extremity Assessment: Overall WFL for tasks assessed    Lower Extremity Assessment Lower Extremity Assessment: Generalized weakness    Cervical / Trunk Assessment Cervical / Trunk Assessment: Normal  Communication   Communication Communication: No apparent difficulties    Cognition Arousal: Alert Behavior During Therapy: WFL for tasks assessed/performed                             Following commands: Intact       Cueing Cueing Techniques: Verbal cues     General Comments      Exercises     Assessment/Plan    PT Assessment Patient needs continued PT services  PT Problem List Decreased strength;Decreased activity tolerance;Decreased balance;Decreased mobility       PT Treatment Interventions DME instruction;Gait training;Stair training;Functional mobility training;Therapeutic activities;Therapeutic exercise;Patient/family education    PT Goals (Current goals can be found in the Care Plan section)  Acute Rehab PT Goals Patient Stated Goal: return home PT Goal Formulation: With patient Time For Goal Achievement: 07/15/24 Potential to Achieve Goals: Good    Frequency Min 3X/week     Co-evaluation               AM-PAC PT 6 Clicks Mobility  Outcome Measure Help needed turning from your back to your side while in a flat bed without using bedrails?: None Help needed moving from lying on your back to sitting on  the side of a flat bed without using bedrails?: None Help needed moving to and from a bed to a chair (including a wheelchair)?: None Help needed standing up from a chair using your arms (e.g., wheelchair or bedside chair)?: None Help needed to walk in hospital room?: A Little Help needed climbing 3-5 steps with a railing? : A Little 6 Click Score: 22    End of Session   Activity Tolerance: Patient tolerated treatment well;Patient limited by fatigue Patient left: in bed;with call bell/phone within reach Nurse Communication: Mobility status PT Visit Diagnosis: Unsteadiness on feet (R26.81);Other abnormalities of gait and mobility (R26.89);Muscle weakness (generalized) (M62.81)    Time: 8948-8893 PT Time Calculation (min) (ACUTE ONLY): 15 min   Charges:   PT Evaluation $PT Eval Low Complexity: 1 Low PT Treatments $Therapeutic Activity: 8-22 mins PT General Charges $$ ACUTE PT VISIT: 1 Visit         2:37 PM, 07/11/2024 Lynwood Music, MPT Physical Therapist with [facility] [phone]  [phone] office [phone] mobile phone

## 2024-07-11 NOTE — Assessment & Plan Note (Signed)
-   Mechanical fall associated with alcohol intoxication - Continue as needed analgesics - Physical therapy evaluation requested - Nondisplaced fractures appreciated on x-ray exam require surgical intervention.

## 2024-07-11 NOTE — Plan of Care (Signed)
" °  Problem: Acute Rehab PT Goals(only PT should resolve) Goal: Pt Will Go Supine/Side To Sit Outcome: Progressing Flowsheets (Taken 07/11/2024 1438) Pt will go Supine/Side to Sit: Independently Goal: Patient Will Transfer Sit To/From Stand Outcome: Progressing Flowsheets (Taken 07/11/2024 1438) Patient will transfer sit to/from stand: with modified independence Goal: Pt Will Transfer Bed To Chair/Chair To Bed Outcome: Progressing Flowsheets (Taken 07/11/2024 1438) Pt will Transfer Bed to Chair/Chair to Bed:  with modified independence  Independently Goal: Pt Will Ambulate Outcome: Progressing Flowsheets (Taken 07/11/2024 1438) Pt will Ambulate:  50 feet  with modified independence  with cane   2:39 PM, 07/11/2024 Lynwood Music, MPT Physical Therapist with [facility] [phone] office [phone] mobile phone  "

## 2024-07-11 NOTE — Assessment & Plan Note (Signed)
-   Cessation counseling provided - Alcohol level less than 15 at time of admission. -As mentioned above continue CIWA protocol, thiamine , folic acid  vitamins.

## 2024-07-11 NOTE — Assessment & Plan Note (Signed)
-   Cessation counseling provided - Current CIWA score 4-6 - Continue CIWA protocol monitoring on as needed lorazepam  - Continue thiamine , folic acid  and multivitamins. -Patient advance to keeping self well-hydrated -Follow clinical response.

## 2024-07-11 NOTE — Progress Notes (Signed)
 " PROGRESS NOTE    Gabriel Koch  FMW:[id number] DOB: 1965/12/23 DOA: 07/09/2024  PCP: Katrinka Aquas, MD    Chief Complaint  Patient presents with   Nausea    Brief admission narrative:  As per H&P written by Dr. Pearlean on 07/09/2024 Gabriel Koch is a 59 y.o. male with medical history significant for alcohol abuse, hypertension, diabetes mellitus, stroke. Patient presented to the ED with complaints of multiple episodes of nausea vomiting and diarrhea that started 2 days ago.  Reports poor oral intake, and intermittent pain with urination.  Patient reports his last alcoholic beverage was 2 days ago 1/2.  He also may he drinks 4-6 beers daily.  Now he feels he is in withdrawal, with tremors, he has had episodes of hallucination in the past with withdrawal.  He has never had seizures. He reports a fall 2 days ago also while he was intoxicated, he fell onto his left side.  Reports burning pain to his upper abdomen.   He was in the ED 12/27, with a wound to his left toe, .  X-ray of his left foot was negative for radiographic evidence of osteomyelitis.  He tells me he was prescribed antibiotics 7-day course he took twice daily and completed the course.  ED notes from that day do not document antibiotic. He has persistent pain to his left foot, but no redness or swelling or discharge, and wound is scabbed over but not worse.   ED Course: Temperature 97.9.  Heart rate 91-107.  Respiratory rate 18-29.  Blood pressure systolic 130-144.  O2 sats 91- 100 percent on room air. Mild elevated liver enzymes AST 148, ALT 62, ALP 308, T. bili 1.7. Sodium 129. Lactic acid 2.7 > 2.1. CT chest abdomen pelvis with contrast shows likely soft tissue contusion with hematoma left flank.  Acute to subacute right L1 transverse process fracture.  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  Several old rib fractures.  Mesenteric stranding in gallbladder fossa. 1 L bolus given. Hospitalist to admit  for alcohol withdrawal.   Assessment & Plan: Assessment & Plan Alcohol withdrawal (HCC) - Cessation counseling provided - Current CIWA score 4-6 - Continue CIWA protocol monitoring on as needed lorazepam  - Continue thiamine , folic acid  and multivitamins. -Patient advance to keeping self well-hydrated -Follow clinical response. Hyponatremia - In the setting of alcohol abuse and dehydration - Has received fluid resuscitation - Will follow electrolytes trend. Falls - Mechanical fall associated with alcohol intoxication - Continue as needed analgesics - Physical therapy evaluation requested - Nondisplaced fractures appreciated on x-ray exam require surgical intervention. Alcohol abuse - Cessation counseling provided - Alcohol level less than 15 at time of admission. -As mentioned above continue CIWA protocol, thiamine , folic acid  vitamins. HTN (hypertension) - Continue to follow vital signs - Continue home antihypertensive agents. Diarrhea - Patient reports overall improvement; will add Florastor to his regimen. - Continue to maintain adequate hydration - Follow electrolytes and replete as needed - Advance diet as tolerated. GERD (gastroesophageal reflux disease) - Patient continued reporting some midepigastric discomfort and intermittent nausea - Continue as needed antiemetics - Continue PPI twice a day and the use of Carafate  - Will add famotidine  nightly - Continue present diet as tolerated. Hypokalemia - Continue to follow electrolytes and further replete.    DVT prophylaxis: Lovenox  Code Status: Full code Family Communication:  No family at bedside. Disposition:   Status is: Observation The patient remains OBS appropriate and will d/c before 2 midnights.  Consultants:  EDP curbside neurosurgery (TLSO when out of bed, PT evaluation and as needed analgesic recommended).  Procedures:  See below for x-ray reports.  Antimicrobials: None   Subjective: No  chest pain, no nausea, no vomiting.  Reports just mild discomfort in his epigastric area.  Would like diet to be advanced.  CIWA score 4-6.  Objective: Vitals:   07/10/24 1500 07/10/24 2029 07/11/24 0405 07/11/24 1341  BP: 132/79 136/81 (!) 145/90 (!) 149/80  Pulse:  85 92 87  Resp:  18 18 16   Temp:  98.5 F (36.9 C) 98.6 F (37 C) 99.1 F (37.3 C)  TempSrc:  Oral Oral Oral  SpO2: 99% 100% 99% 97%  Weight:      Height:        Intake/Output Summary (Last 24 hours) at 07/11/2024 1735 Last data filed at 07/11/2024 0300 Gross per 24 hour  Intake 600 ml  Output --  Net 600 ml   Filed Weights   07/09/24 1554 07/10/24 0145  Weight: 58 kg 55.9 kg    Examination: General exam: Alert, awake, oriented x 3; complaining of nausea and epigastric pain.  Also having significant pain on his lower back and left foot.  No fever Respiratory system: Good saturation on room air; positive scattered rhonchi.  No wheezing Cardiovascular system:RRR.  No rubs, no gallops, no JVD. Gastrointestinal system: Abdomen is nondistended, soft and mildly tender to palpation in midepigastric area.  No guarding.  Positive bowel sounds. Central nervous system: No focal neurological deficits. Extremities: No cyanosis or clubbing. Skin: No petechiae; bruises in his lower back different healing stages appreciated on exam.  1st and 2nd toe with a scab in between interdigital web; no active drainage.  Patient reports tenderness on palpation. Psychiatry: Judgement and insight appear normal.  Flat affect.  Data Reviewed: I have personally reviewed following labs and imaging studies  CBC: Recent Labs  Lab 07/09/24 1602 07/10/24 0501 07/11/24 0410  WBC 6.7 2.9* 3.8*  NEUTROABS 5.2  --   --   HGB 9.8* 7.8* 7.8*  HCT 28.3* 22.9* 23.2*  MCV 108.0* 110.1* 111.0*  PLT 164 100* 101*    Basic Metabolic Panel: Recent Labs  Lab 07/09/24 1602 07/10/24 0501 07/11/24 0410  NA 129* 131* 133*  K 3.7 3.2* 3.7  CL 93* 99  101  CO2 22 23 21*  GLUCOSE 110* 86 85  BUN 6 5* <5*  CREATININE 0.99 0.75 0.73  CALCIUM  9.0 8.1* 8.7*  MG  --   --  1.4*   GFR: Estimated Creatinine Clearance: 79.6 mL/min (by C-G formula based on SCr of 0.73 mg/dL).  Liver Function Tests: Recent Labs  Lab 07/09/24 1602  AST 146*  ALT 62*  ALKPHOS 308*  BILITOT 1.7*  PROT 7.5  ALBUMIN 4.0    CBG: Recent Labs  Lab 07/10/24 2027 07/11/24 0009 07/11/24 0736 07/11/24 1107 07/11/24 1631  GLUCAP 113* 94 112* 98 100*    No results found for this or any previous visit (from the past 240 hours).   Radiology Studies: No results found.  Scheduled Meds:  vitamin B-12  1,000 mcg Oral Daily   enoxaparin  (LOVENOX ) injection  40 mg Subcutaneous Q24H   famotidine   20 mg Oral QHS   feeding supplement  1 Container Oral TID BM   folic acid   1 mg Oral Daily   LORazepam   0-4 mg Intravenous Q6H   Followed by   LORazepam   0-4 mg Intravenous Q12H   multivitamin with  minerals  1 tablet Oral Daily   nicotine   21 mg Transdermal Daily   pantoprazole  (PROTONIX ) IV  40 mg Intravenous BID   sucralfate   1 g Oral TID WC & HS   thiamine   100 mg Oral Daily   Or   thiamine   100 mg Intravenous Daily   Continuous Infusions:  promethazine  (PHENERGAN ) injection (IM or IVPB)       LOS: 0 days    Time spent: 50 minutes   Eric Nunnery, MD [facility]   To contact the attending provider between 7A-7P or the covering provider during after hours 7P-7A, please log into the web site [email or web address] and access using universal [place] password for that web site. If you do not have the password, please call the hospital operator.  07/11/2024, 5:35 PM    "

## 2024-07-11 NOTE — Assessment & Plan Note (Signed)
-   In the setting of alcohol abuse and dehydration - Has received fluid resuscitation - Will follow electrolytes trend.

## 2024-07-11 NOTE — Plan of Care (Signed)
   Problem: Education: Goal: Knowledge of General Education information will improve Description: Including pain rating scale, medication(s)/side effects and non-pharmacologic comfort measures Outcome: Progressing   Problem: Activity: Goal: Risk for activity intolerance will decrease Outcome: Progressing   Problem: Coping: Goal: Level of anxiety will decrease Outcome: Progressing

## 2024-07-11 NOTE — Assessment & Plan Note (Signed)
-   Continue to follow vital signs - Continue home antihypertensive agents.

## 2024-07-11 NOTE — Assessment & Plan Note (Signed)
-   Patient continued reporting some midepigastric discomfort and intermittent nausea - Continue as needed antiemetics - Continue PPI twice a day and the use of Carafate  - Will add famotidine  nightly - Continue present diet as tolerated.

## 2024-07-12 DIAGNOSIS — R296 Repeated falls: Secondary | ICD-10-CM | POA: Diagnosis not present

## 2024-07-12 DIAGNOSIS — F10930 Alcohol use, unspecified with withdrawal, uncomplicated: Secondary | ICD-10-CM | POA: Diagnosis not present

## 2024-07-12 LAB — BASIC METABOLIC PANEL WITH GFR
Anion gap: 11 (ref 5–15)
BUN: <5 mg/dL — ABNORMAL LOW (ref 6–20)
CO2: 21 mmol/L — ABNORMAL LOW (ref 22–32)
Calcium: 8.6 mg/dL — ABNORMAL LOW (ref 8.9–10.3)
Chloride: 102 mmol/L (ref 98–111)
Creatinine, Ser: 0.67 mg/dL (ref 0.61–1.24)
GFR, Estimated: >60 mL/min
Glucose, Bld: 86 mg/dL (ref 70–99)
Potassium: 3.4 mmol/L — ABNORMAL LOW (ref 3.5–5.1)
Sodium: 133 mmol/L — ABNORMAL LOW (ref 135–145)

## 2024-07-12 LAB — C DIFFICILE QUICK SCREEN W PCR REFLEX
C Diff antigen: NEGATIVE
C Diff interpretation: NOT DETECTED
C Diff toxin: NEGATIVE

## 2024-07-12 LAB — GLUCOSE, CAPILLARY
Glucose-Capillary: 101 mg/dL — ABNORMAL HIGH (ref 70–99)
Glucose-Capillary: 112 mg/dL — ABNORMAL HIGH (ref 70–99)
Glucose-Capillary: 98 mg/dL (ref 70–99)

## 2024-07-12 LAB — MAGNESIUM: Magnesium: 1.4 mg/dL — ABNORMAL LOW (ref 1.7–2.4)

## 2024-07-12 MED ORDER — OXYCODONE HCL 5 MG PO TABS
5.0000 mg | ORAL_TABLET | ORAL | Status: DC | PRN
  Administered 2024-07-12 – 2024-07-15 (×11): 5 mg via ORAL
  Filled 2024-07-12 (×11): qty 1

## 2024-07-12 MED ORDER — MAGNESIUM SULFATE 2 GM/50ML IV SOLN
2.0000 g | Freq: Once | INTRAVENOUS | Status: AC
  Administered 2024-07-12: 2 g via INTRAVENOUS
  Filled 2024-07-12: qty 50

## 2024-07-12 MED ORDER — POTASSIUM CHLORIDE CRYS ER 20 MEQ PO TBCR
40.0000 meq | EXTENDED_RELEASE_TABLET | Freq: Once | ORAL | Status: AC
  Administered 2024-07-12: 40 meq via ORAL
  Filled 2024-07-12: qty 2

## 2024-07-12 NOTE — Plan of Care (Signed)
   Problem: Activity: Goal: Risk for activity intolerance will decrease Outcome: Progressing   Problem: Coping: Goal: Level of anxiety will decrease Outcome: Progressing

## 2024-07-12 NOTE — Assessment & Plan Note (Signed)
-   Patient reports overall improvement; will add Florastor to his regimen. - Continue to maintain adequate hydration - Follow electrolytes and replete as needed - Advance diet as tolerated.

## 2024-07-12 NOTE — Assessment & Plan Note (Signed)
-   In the setting of alcohol abuse and dehydration - Has received fluid resuscitation - Will follow electrolytes trend.

## 2024-07-12 NOTE — Assessment & Plan Note (Signed)
-   Continue to follow vital signs - Continue home antihypertensive agents.

## 2024-07-12 NOTE — Assessment & Plan Note (Signed)
-   Cessation counseling provided - Current CIWA score 4-6 - Continue CIWA protocol monitoring on as needed lorazepam  - Continue thiamine , folic acid  and multivitamins. -Patient advance to keeping self well-hydrated -Follow clinical response.

## 2024-07-12 NOTE — Assessment & Plan Note (Signed)
-   Cessation counseling provided - Alcohol level less than 15 at time of admission. -As mentioned above continue CIWA protocol, thiamine , folic acid  vitamins.

## 2024-07-12 NOTE — Assessment & Plan Note (Signed)
-   Mechanical fall associated with alcohol intoxication - Continue as needed analgesics - PT/OT evaluation pending - acute/subacute Transverse process L1 fracture  - Multiple old rib fractures

## 2024-07-12 NOTE — Assessment & Plan Note (Signed)
-   Patient continued reporting some midepigastric discomfort and intermittent nausea - Continue as needed antiemetics - Continue PPI twice a day and the use of Carafate  - Will add famotidine  nightly - Continue present diet as tolerated.

## 2024-07-12 NOTE — Progress Notes (Signed)
 " PROGRESS NOTE    Gabriel Koch  FMW:[id number] DOB: May 16, 1966 DOA: 07/09/2024  PCP: Katrinka Aquas, MD    Chief Complaint  Patient presents with   Nausea    Brief admission narrative:  As per H&P written by Dr. Pearlean on 07/09/2024 Gabriel Koch is a 59 y.o. male with medical history significant for alcohol abuse, hypertension, diabetes mellitus, stroke. Patient presented to the ED with complaints of multiple episodes of nausea vomiting and diarrhea that started 2 days ago.  Reports poor oral intake, and intermittent pain with urination.  Patient reports his last alcoholic beverage was 2 days ago 1/2.  He also may he drinks 4-6 beers daily.  Now he feels he is in withdrawal, with tremors, he has had episodes of hallucination in the past with withdrawal.  He has never had seizures. He reports a fall 2 days ago also while he was intoxicated, he fell onto his left side.  Reports burning pain to his upper abdomen.   He was in the ED 12/27, with a wound to his left toe, .  X-ray of his left foot was negative for radiographic evidence of osteomyelitis.  He tells me he was prescribed antibiotics 7-day course he took twice daily and completed the course.  ED notes from that day do not document antibiotic. He has persistent pain to his left foot, but no redness or swelling or discharge, and wound is scabbed over but not worse.   ED Course: Temperature 97.9.  Heart rate 91-107.  Respiratory rate 18-29.  Blood pressure systolic 130-144.  O2 sats 91- 100 percent on room air. Mild elevated liver enzymes AST 148, ALT 62, ALP 308, T. bili 1.7. Sodium 129. Lactic acid 2.7 > 2.1. CT chest abdomen pelvis with contrast shows likely soft tissue contusion with hematoma left flank.  Acute to subacute right L1 transverse process fracture.  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  Several old rib fractures.  Mesenteric stranding in gallbladder fossa. 1 L bolus given. Hospitalist to admit  for alcohol withdrawal.   Assessment & Plan: Assessment & Plan Alcohol withdrawal (HCC) - Cessation counseling provided - Current CIWA score 4-6 - Continue CIWA protocol monitoring on as needed lorazepam  - Continue thiamine , folic acid  and multivitamins. -Patient advance to keeping self well-hydrated -Follow clinical response. Hyponatremia - In the setting of alcohol abuse and dehydration - Has received fluid resuscitation - Will follow electrolytes trend. Falls - Mechanical fall associated with alcohol intoxication - Continue as needed analgesics - PT/OT evaluation pending - acute/subacute Transverse process L1 fracture  - Multiple old rib fractures Alcohol abuse - Cessation counseling provided - Alcohol level less than 15 at time of admission. -As mentioned above continue CIWA protocol, thiamine , folic acid  vitamins. HTN (hypertension) - Continue to follow vital signs - Continue home antihypertensive agents. Diarrhea - Patient reports overall improvement; will add Florastor to his regimen. - Continue to maintain adequate hydration - Follow electrolytes and replete as needed - Advance diet as tolerated. GERD (gastroesophageal reflux disease) - Patient continued reporting some midepigastric discomfort and intermittent nausea - Continue as needed antiemetics - Continue PPI twice a day and the use of Carafate  - Will add famotidine  nightly - Continue present diet as tolerated. Hypokalemia - Continue to follow electrolytes and further replete.    DVT prophylaxis: Lovenox  Code Status: Full code Family Communication:  No family at bedside. Disposition:   Status is: Observation The patient remains OBS appropriate and will d/c before 2  midnights.   Consultants:  EDP curbside neurosurgery (TLSO when out of bed, PT evaluation and as needed analgesic recommended).  Procedures:  See below for x-ray reports.  Antimicrobials: None   Subjective: Reports left flank  pain.  Has bruise over there  Vital signs stable Objective: Vitals:   07/11/24 1341 07/11/24 1936 07/12/24 0423 07/12/24 1409  BP: (!) 149/80 136/89 136/79 137/75  Pulse: 87 95  98  Resp: 16 18 18 18   Temp: 99.1 F (37.3 C) 98.2 F (36.8 C) 98.2 F (36.8 C) 98.1 F (36.7 C)  TempSrc: Oral Oral Oral Oral  SpO2: 97% 100% 92% 97%  Weight:      Height:        Intake/Output Summary (Last 24 hours) at 07/12/2024 1514 Last data filed at 07/12/2024 0904 Gross per 24 hour  Intake 480 ml  Output --  Net 480 ml   Filed Weights   07/09/24 1554 07/10/24 0145  Weight: 58 kg 55.9 kg    Examination:  : Chronically ill looking Chest: Clear CVs: S1, S2, no murmur, regular rhythm Abdomen: Soft, nontender, left flank bruise Extremities: No edema  Data Reviewed: I have personally reviewed following labs and imaging studies  CBC: Recent Labs  Lab 07/09/24 1602 07/10/24 0501 07/11/24 0410  WBC 6.7 2.9* 3.8*  NEUTROABS 5.2  --   --   HGB 9.8* 7.8* 7.8*  HCT 28.3* 22.9* 23.2*  MCV 108.0* 110.1* 111.0*  PLT 164 100* 101*    Basic Metabolic Panel: Recent Labs  Lab 07/09/24 1602 07/10/24 0501 07/11/24 0410 07/12/24 0406  NA 129* 131* 133* 133*  K 3.7 3.2* 3.7 3.4*  CL 93* 99 101 102  CO2 22 23 21* 21*  GLUCOSE 110* 86 85 86  BUN 6 5* <5* <5*  CREATININE 0.99 0.75 0.73 0.67  CALCIUM  9.0 8.1* 8.7* 8.6*  MG  --   --  1.4* 1.4*   GFR: Estimated Creatinine Clearance: 79.6 mL/min (by C-G formula based on SCr of 0.67 mg/dL).  Liver Function Tests: Recent Labs  Lab 07/09/24 1602  AST 146*  ALT 62*  ALKPHOS 308*  BILITOT 1.7*  PROT 7.5  ALBUMIN 4.0    CBG: Recent Labs  Lab 07/11/24 0736 07/11/24 1107 07/11/24 1631 07/11/24 2355 07/12/24 0744  GLUCAP 112* 98 100* 104* 98    Recent Results (from the past 240 hours)  C Difficile Quick Screen w PCR reflex     Status: None   Collection Time: 07/12/24 11:00 AM   Specimen: Stool  Result Value Ref Range Status   C  Diff antigen NEGATIVE NEGATIVE Final   C Diff toxin NEGATIVE NEGATIVE Final   C Diff interpretation No C. difficile detected.  Final    Comment: Performed at [facility], [address] [place], [place] [address]     Radiology Studies: No results found.  Scheduled Meds:  vitamin B-12  1,000 mcg Oral Daily   enoxaparin  (LOVENOX ) injection  40 mg Subcutaneous Q24H   famotidine   20 mg Oral QHS   feeding supplement  1 Container Oral TID BM   folic acid   1 mg Oral Daily   LORazepam   0-4 mg Intravenous Q12H   multivitamin with minerals  1 tablet Oral Daily   nicotine   21 mg Transdermal Daily   pantoprazole  (PROTONIX ) IV  40 mg Intravenous BID   saccharomyces boulardii  250 mg Oral BID   sucralfate   1 g Oral TID WC & HS   thiamine   100 mg Oral Daily   Or   thiamine   100 mg Intravenous Daily   Continuous Infusions:  promethazine  (PHENERGAN ) injection (IM or IVPB)       LOS: 1 day     Kelisha Dall, MD [facility]   To contact the attending provider between 7A-7P or the covering provider during after hours 7P-7A, please log into the web site [email or web address] and access using universal [place] password for that web site. If you do not have the password, please call the hospital operator.  07/12/2024, 3:14 PM    "

## 2024-07-12 NOTE — Assessment & Plan Note (Signed)
-   Continue to follow electrolytes and further replete.

## 2024-07-13 DIAGNOSIS — E43 Unspecified severe protein-calorie malnutrition: Secondary | ICD-10-CM | POA: Insufficient documentation

## 2024-07-13 DIAGNOSIS — F10930 Alcohol use, unspecified with withdrawal, uncomplicated: Secondary | ICD-10-CM | POA: Diagnosis not present

## 2024-07-13 LAB — GASTROINTESTINAL PANEL BY PCR, STOOL (REPLACES STOOL CULTURE)

## 2024-07-13 LAB — GLUCOSE, CAPILLARY
Glucose-Capillary: 103 mg/dL — ABNORMAL HIGH (ref 70–99)
Glucose-Capillary: 127 mg/dL — ABNORMAL HIGH (ref 70–99)

## 2024-07-13 MED ORDER — ENSURE PLUS HIGH PROTEIN PO LIQD
237.0000 mL | Freq: Three times a day (TID) | ORAL | Status: DC
  Administered 2024-07-13 – 2024-07-14 (×3): 237 mL via ORAL

## 2024-07-13 NOTE — Progress Notes (Signed)
 " PROGRESS NOTE    Gabriel Koch  FMW:[id number] DOB: Sep 21, 1965 DOA: 07/09/2024  PCP: Katrinka Aquas, MD    Chief Complaint  Patient presents with   Nausea    Brief admission narrative:  Gabriel Koch is a 59 y.o. male with medical history significant for alcohol abuse, hypertension, diabetes mellitus, stroke, presented to the ER with multiple episodes of nausea, vomiting and diarrhea that started 2 days prior to presentation.  Patient reports poor oral intake, intermittent pain.  Also had fall with bruises on the left upper thigh as well as hip/flank area.  Patient feels shaky. Admitted for potential withdrawal   ED Course: Temperature 97.9.  Heart rate 91-107.  Respiratory rate 18-29.  Blood pressure systolic 130-144.  O2 sats 91- 100 percent on room air. Mild elevated liver enzymes AST 148, ALT 62, ALP 308, T. bili 1.7. Sodium 129. Lactic acid 2.7 > 2.1. CT chest abdomen pelvis with contrast shows likely soft tissue contusion with hematoma left flank.  Acute to subacute right L1 transverse process fracture.  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  Several old rib fractures.  Mesenteric stranding in gallbladder fossa. 1 L bolus given. Hospitalist to admit for alcohol withdrawal.   Assessment & Plan:   Assessment & Plan Alcohol withdrawal (HCC) - Cessation counseling provided - Continue to monitor per CIWA protocol, received couple doses of Ativan  in past 24 hours - Complains of nausea, vomiting - Thiamine 's, multivitamins   hyponatremia - Will monitor.  sodium 133 on 1/7  Falls - Mechanical fall associated with alcohol intoxication - acute/subacute Transverse process L1 fracture .  Per ED provider discussion with neurosurgery, TLSO recommended.  Waiting for it - Multiple old rib fractures - PT/OT -Analgesics as needed  HTN (hypertension) - Continue to follow vital signs - Continue home antihypertensive agents.  Diarrhea - Will  monitor.  GERD (gastroesophageal reflux disease) - On PPI, Carafate  and famotidine .   - Advance diet as tolerated  Hypokalemia - Continue to follow electrolytes and further replete.  DVT prophylaxis: Lovenox  Code Status: Full code Family Communication:  No family at bedside. Disposition:   Status is: Observation The patient remains OBS appropriate and will d/c before 2 midnights.   Consultants:  EDP curbside neurosurgery (TLSO when out of bed, PT evaluation and as needed analgesic recommended).  Procedures:  See below for x-ray reports.  Antimicrobials: None   Subjective: Reports vomiting overnight.  He said he did have good supper but later vomited.  He still has some nausea and is hoping to keep food down today.   Objective: Vitals:   07/12/24 1931 07/13/24 0446 07/13/24 0753 07/13/24 0840  BP: 136/77 (!) 147/84 (!) 146/88 138/82  Pulse: 88 80 73 72  Resp: 18 18 16    Temp: 98.4 F (36.9 C) 98.1 F (36.7 C)    TempSrc: Oral Oral    SpO2: 96% 96% 98%   Weight:      Height:        Intake/Output Summary (Last 24 hours) at 07/13/2024 1324 Last data filed at 07/13/2024 0320 Gross per 24 hour  Intake 240 ml  Output --  Net 240 ml   Filed Weights   07/09/24 1554 07/10/24 0145  Weight: 58 kg 55.9 kg    Examination:  : Chronically ill looking Chest: Clear CVs: S1, S2, no murmur, regular rhythm Abdomen: Soft, nontender, left flank bruise Extremities: No edema  Data Reviewed: I have personally reviewed following labs and imaging studies  CBC: Recent Labs  Lab 07/09/24 1602 07/10/24 0501 07/11/24 0410  WBC 6.7 2.9* 3.8*  NEUTROABS 5.2  --   --   HGB 9.8* 7.8* 7.8*  HCT 28.3* 22.9* 23.2*  MCV 108.0* 110.1* 111.0*  PLT 164 100* 101*    Basic Metabolic Panel: Recent Labs  Lab 07/09/24 1602 07/10/24 0501 07/11/24 0410 07/12/24 0406  NA 129* 131* 133* 133*  K 3.7 3.2* 3.7 3.4*  CL 93* 99 101 102  CO2 22 23 21* 21*  GLUCOSE 110* 86 85 86  BUN 6  5* <5* <5*  CREATININE 0.99 0.75 0.73 0.67  CALCIUM  9.0 8.1* 8.7* 8.6*  MG  --   --  1.4* 1.4*   GFR: Estimated Creatinine Clearance: 79.6 mL/min (by C-G formula based on SCr of 0.67 mg/dL).  Liver Function Tests: Recent Labs  Lab 07/09/24 1602  AST 146*  ALT 62*  ALKPHOS 308*  BILITOT 1.7*  PROT 7.5  ALBUMIN 4.0    CBG: Recent Labs  Lab 07/11/24 2355 07/12/24 0744 07/12/24 2020 07/12/24 2354 07/13/24 0708  GLUCAP 104* 98 112* 101* 103*    Recent Results (from the past 240 hours)  C Difficile Quick Screen w PCR reflex     Status: None   Collection Time: 07/12/24 11:00 AM   Specimen: Stool  Result Value Ref Range Status   C Diff antigen NEGATIVE NEGATIVE Final   C Diff toxin NEGATIVE NEGATIVE Final   C Diff interpretation No C. difficile detected.  Final    Comment: Performed at [facility], [address], [place], [place] [address]     Radiology Studies: No results found.  Scheduled Meds:  vitamin B-12  1,000 mcg Oral Daily   enoxaparin  (LOVENOX ) injection  40 mg Subcutaneous Q24H   famotidine   20 mg Oral QHS   feeding supplement  1 Container Oral TID BM   folic acid   1 mg Oral Daily   LORazepam   0-4 mg Intravenous Q12H   multivitamin with minerals  1 tablet Oral Daily   nicotine   21 mg Transdermal Daily   pantoprazole  (PROTONIX ) IV  40 mg Intravenous BID   saccharomyces boulardii  250 mg Oral BID   sucralfate   1 g Oral TID WC & HS   thiamine   100 mg Oral Daily   Or   thiamine   100 mg Intravenous Daily   Continuous Infusions:  promethazine  (PHENERGAN ) injection (IM or IVPB)       LOS: 2 days     Suhas Estis, MD [facility]   To contact the attending provider between 7A-7P or the covering provider during after hours 7P-7A, please log into the web site [email or web address] and access using universal [place] password for that web site. If you do not have the password, please call the hospital operator.  07/13/2024, 1:24 PM    "

## 2024-07-13 NOTE — Plan of Care (Signed)
   Problem: Activity: Goal: Risk for activity intolerance will decrease Outcome: Progressing   Problem: Coping: Goal: Level of anxiety will decrease Outcome: Progressing

## 2024-07-13 NOTE — Progress Notes (Signed)
 Initial Nutrition Assessment  DOCUMENTATION CODES:  Severe malnutrition in context of social or environmental circumstances  INTERVENTION:  Ensure Plus High Protein po TID, each supplement provides 350 kcal and 20 grams of protein Continue MVI with minerals, thiamine , folic acid  Patient is at risk for refeeding syndrome given hx ETOH abuse. Recommend check phosphorus and continue to monitor K and Mag and replete as needed.  NUTRITION DIAGNOSIS:  Severe Malnutrition related to social / environmental circumstances (ETOH abuse) as evidenced by severe muscle depletion, severe fat depletion.  GOAL:  Patient will meet greater than or equal to 90% of their needs  MONITOR:  PO intake, Supplement acceptance, Skin  REASON FOR ASSESSMENT:  Rounds, Malnutrition Screening Tool   ASSESSMENT:  59 yo male admitted with alcohol withdrawal, mechanical fall, gastroenteritis. PMH includes alcohol abuse, HTN, DM, stroke, GERD, COPD, PTSD, smoker.  Patient reports that he has had a poor appetite and poor oral intake since last Friday which is when he started having nausea and diarrhea. He is receiving probiotics for ongoing diarrhea. He has no regular meal schedule at home and typically eats when he gets hungry, but has been eating less over the past week. He drinks 4-5 beers in one day sometimes. He has been dizzy when he bends over and sits back up. He likes Ensure and Boost Breeze supplements, currently being offered Boost Breeze TID; will change to Ensure Plus High Protein for more calories and protein.   Patient has required potassium chloride  and magnesium  sulfate for repletion since admission.  Patient meets criteria for severe malnutrition, given severe depletion of muscle and subcutaneous fat mass.  Usual weight: 134 lbs per patient a year ago, 58 kg 12/27 per EMR Current weight: 55.9 kg (123 lbs)  8% weight loss within a year is not clinically significant 4% weight loss within 1 month is not  clinically significant  Average Meal Intake: 1/5-1/8: 50% intake x 4 recorded meals  Nutritionally Relevant Medications: Scheduled Meds:  vitamin B-12  1,000 mcg Oral Daily   enoxaparin  (LOVENOX ) injection  40 mg Subcutaneous Q24H   famotidine   20 mg Oral QHS   feeding supplement  1 Container Oral TID BM   folic acid   1 mg Oral Daily   LORazepam   0-4 mg Intravenous Q12H   multivitamin with minerals  1 tablet Oral Daily   nicotine   21 mg Transdermal Daily   pantoprazole  (PROTONIX ) IV  40 mg Intravenous BID   saccharomyces boulardii  250 mg Oral BID   sucralfate   1 g Oral TID WC & HS   thiamine   100 mg Oral Daily   Or   thiamine   100 mg Intravenous Daily   Continuous Infusions:  promethazine  (PHENERGAN ) injection (IM or IVPB)     PRN Meds:.acetaminophen  **OR** acetaminophen , melatonin, oxyCODONE , polyethylene glycol, prochlorperazine , promethazine  (PHENERGAN ) injection (IM or IVPB)  Labs Reviewed: Na 129-->131-->133-->133 K 3.7-->3.2-->3.7-->3.4 Mag 1.4-->1.4 CBG ranges from 98-112 mg/dL over the last 24 hours HgbA1c 4.4   NUTRITION - FOCUSED PHYSICAL EXAM: Flowsheet Row Most Recent Value  Orbital Region Severe depletion  Upper Arm Region Severe depletion  Thoracic and Lumbar Region Severe depletion  Buccal Region Moderate depletion  Temple Region Moderate depletion  Clavicle Bone Region Moderate depletion  Clavicle and Acromion Bone Region Moderate depletion  Scapular Bone Region Severe depletion  Dorsal Hand Moderate depletion  Patellar Region Severe depletion  Anterior Thigh Region Severe depletion  Posterior Calf Region Severe depletion  Edema (RD Assessment) Mild  Hair Reviewed  Eyes Reviewed  Mouth Reviewed  Skin Reviewed  Nails Reviewed    Diet Order:   Diet Order             DIET SOFT Room service appropriate? Yes; Fluid consistency: Thin  Diet effective now                   EDUCATION NEEDS:  Not appropriate for education at this  time  Skin:  Skin Assessment: Skin Integrity Issues: Skin Integrity Issues:: Other (Comment) Other: pressure injury to sacrum (stage not specified), healing wound to toe on L foot  Last BM:  1/8  Height:  Ht Readings from Last 1 Encounters:  07/09/24 5' 7 (1.702 m)   Weight:  Wt Readings from Last 1 Encounters:  07/10/24 55.9 kg   Ideal Body Weight:  67.3 kg  BMI:  Body mass index is 19.31 kg/m.  Estimated Nutritional Needs:  Kcal:  1750-1950 Protein:  85-95 gm Fluid:  1.8-2 L   Suzen HUNT RD, LDN, CNSC Contact via secure chat. If unavailable, use group chat RD Inpatient.

## 2024-07-13 NOTE — Plan of Care (Signed)

## 2024-07-14 DIAGNOSIS — F10930 Alcohol use, unspecified with withdrawal, uncomplicated: Secondary | ICD-10-CM | POA: Diagnosis not present

## 2024-07-14 LAB — CBC WITH DIFFERENTIAL/PLATELET
Abs Immature Granulocytes: 0.01 K/uL (ref 0.00–0.07)
Basophils Absolute: 0 K/uL (ref 0.0–0.1)
Basophils Relative: 1 %
Eosinophils Absolute: 0.1 K/uL (ref 0.0–0.5)
Eosinophils Relative: 2 %
HCT: 23.2 % — ABNORMAL LOW (ref 39.0–52.0)
Hemoglobin: 7.5 g/dL — ABNORMAL LOW (ref 13.0–17.0)
Immature Granulocytes: 0 %
Lymphocytes Relative: 33 %
Lymphs Abs: 1 K/uL (ref 0.7–4.0)
MCH: 36.8 pg — ABNORMAL HIGH (ref 26.0–34.0)
MCHC: 32.3 g/dL (ref 30.0–36.0)
MCV: 113.7 fL — ABNORMAL HIGH (ref 80.0–100.0)
Monocytes Absolute: 0.4 K/uL (ref 0.1–1.0)
Monocytes Relative: 14 %
Neutro Abs: 1.5 K/uL — ABNORMAL LOW (ref 1.7–7.7)
Neutrophils Relative %: 50 %
Platelets: 140 K/uL — ABNORMAL LOW (ref 150–400)
RBC: 2.04 MIL/uL — ABNORMAL LOW (ref 4.22–5.81)
RDW: 14.4 % (ref 11.5–15.5)
Smear Review: NORMAL
WBC: 3 K/uL — ABNORMAL LOW (ref 4.0–10.5)
nRBC: 0 % (ref 0.0–0.2)

## 2024-07-14 LAB — COMPREHENSIVE METABOLIC PANEL WITH GFR
ALT: 35 U/L (ref 0–44)
AST: 53 U/L — ABNORMAL HIGH (ref 15–41)
Albumin: 3.8 g/dL (ref 3.5–5.0)
Alkaline Phosphatase: 234 U/L — ABNORMAL HIGH (ref 38–126)
Anion gap: 17 — ABNORMAL HIGH (ref 5–15)
BUN: 8 mg/dL (ref 6–20)
CO2: 16 mmol/L — ABNORMAL LOW (ref 22–32)
Calcium: 9.1 mg/dL (ref 8.9–10.3)
Chloride: 103 mmol/L (ref 98–111)
Creatinine, Ser: 0.71 mg/dL (ref 0.61–1.24)
GFR, Estimated: >60 mL/min
Glucose, Bld: 92 mg/dL (ref 70–99)
Potassium: 3.7 mmol/L (ref 3.5–5.1)
Sodium: 136 mmol/L (ref 135–145)
Total Bilirubin: 1.2 mg/dL (ref 0.0–1.2)
Total Protein: 6.7 g/dL (ref 6.5–8.1)

## 2024-07-14 LAB — GLUCOSE, CAPILLARY
Glucose-Capillary: 104 mg/dL — ABNORMAL HIGH (ref 70–99)
Glucose-Capillary: 115 mg/dL — ABNORMAL HIGH (ref 70–99)
Glucose-Capillary: 123 mg/dL — ABNORMAL HIGH (ref 70–99)
Glucose-Capillary: 93 mg/dL (ref 70–99)

## 2024-07-14 MED ORDER — PROMETHAZINE HCL 25 MG/ML IJ SOLN
INTRAMUSCULAR | Status: AC
  Filled 2024-07-14: qty 1

## 2024-07-14 MED ORDER — DICLOFENAC SODIUM 1 % EX GEL
4.0000 g | Freq: Three times a day (TID) | CUTANEOUS | Status: DC | PRN
  Administered 2024-07-14: 4 g via TOPICAL
  Filled 2024-07-14: qty 100

## 2024-07-14 MED ORDER — ORAL CARE MOUTH RINSE: 15.0000 mL | OROMUCOSAL | Status: DC | PRN

## 2024-07-14 NOTE — Progress Notes (Signed)
 " PROGRESS NOTE    Gabriel Koch  FMW:[id number] DOB: [birth year] DOA: 07/09/2024  PCP: Katrinka Aquas, MD    Chief Complaint  Patient presents with   Nausea    Brief admission narrative:  Gabriel Koch is a 59 y.o. male with medical history significant for alcohol abuse, hypertension, diabetes mellitus, stroke, presented to the ER with multiple episodes of nausea, vomiting and diarrhea that started 2 days prior to presentation.  Patient reports poor oral intake, intermittent pain.  Also had fall with bruises on the left upper thigh as well as hip/flank area.  Patient feels shaky. Admitted for potential withdrawal   ED Course: Temperature 97.9.  Heart rate 91-107.  Respiratory rate 18-29.  Blood pressure systolic 130-144.  O2 sats 91- 100 percent on room air. Mild elevated liver enzymes AST 148, ALT 62, ALP 308, T. bili 1.7. Sodium 129. Lactic acid 2.7 > 2.1. CT chest abdomen pelvis with contrast shows likely soft tissue contusion with hematoma left flank.  Acute to subacute right L1 transverse process fracture.  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  Several old rib fractures.  Mesenteric stranding in gallbladder fossa. 1 L bolus given. Hospitalist to admit for alcohol withdrawal.   Assessment & Plan:   Assessment & Plan Alcohol withdrawal (HCC) - Cessation counseling provided - Continue to monitor per CIWA protocol, received couple doses of Ativan  in past 24 hours - Complains of nausea, vomiting.  Tolerating food okay  - Thiamine 's, multivitamins   hyponatremia - Will monitor.  sodium 133 on 1/7  Falls - Mechanical fall associated with alcohol intoxication - acute/subacute Transverse process L1 fracture .  Per ED provider discussion with neurosurgery, TLSO recommended.  Waiting for it.  Hopefully will get today. - Multiple old rib fractures - PT/OT -Analgesics as needed  HTN (hypertension) - Continue to follow vital signs - Continue home  antihypertensive agents.  Diarrhea - Will monitor.  GERD (gastroesophageal reflux disease) - On PPI, Carafate  and famotidine .   - Advance diet as tolerated  Hypokalemia - Continue to follow electrolytes and further replete.  DVT prophylaxis: Lovenox  Code Status: Full code Family Communication:  No family at bedside. Disposition: Discharge tomorrow.  Status is: Observation The patient remains OBS appropriate and will d/c before 2 midnights.   Consultants:  EDP curbside neurosurgery (TLSO when out of bed, PT evaluation and as needed analgesic recommended).  Procedures:  See below for x-ray reports.  Antimicrobials: None   Subjective: Patient reports nausea.  No vomiting today.  Tolerating food okay.  Reports dizziness and generalized weakness.  No fever or chills.  Continues to have back pain/left hip pain/flank pain.  He says he will wear TLSO when available  Objective: Vitals:   07/13/24 1423 07/13/24 1802 07/13/24 2052 07/14/24 0506  BP: 129/70 126/74 135/72 (!) 142/78  Pulse: 87 75 72 73  Resp: 20 16 17 16   Temp: 98.2 F (36.8 C) 98.5 F (36.9 C) 98.3 F (36.8 C) 98 F (36.7 C)  TempSrc: Oral Oral Oral Oral  SpO2: 97% 97% 96% 94%  Weight:      Height:        Intake/Output Summary (Last 24 hours) at 07/14/2024 1142 Last data filed at 07/14/2024 0739 Gross per 24 hour  Intake 477 ml  Output 200 ml  Net 277 ml   Filed Weights   07/09/24 1554 07/10/24 0145  Weight: 58 kg 55.9 kg    Examination:  : Chronically ill looking Chest: Clear  CVs: S1, S2, no murmur, regular rhythm Abdomen: Soft, nontender, left flank bruise Extremities: No edema  Data Reviewed: I have personally reviewed following labs and imaging studies  CBC: Recent Labs  Lab 07/09/24 1602 07/10/24 0501 07/11/24 0410 07/14/24 0445  WBC 6.7 2.9* 3.8* 3.0*  NEUTROABS 5.2  --   --  1.5*  HGB 9.8* 7.8* 7.8* 7.5*  HCT 28.3* 22.9* 23.2* 23.2*  MCV 108.0* 110.1* 111.0* 113.7*  PLT 164  100* 101* 140*    Basic Metabolic Panel: Recent Labs  Lab 07/09/24 1602 07/10/24 0501 07/11/24 0410 07/12/24 0406 07/14/24 0445  NA 129* 131* 133* 133* 136  K 3.7 3.2* 3.7 3.4* 3.7  CL 93* 99 101 102 103  CO2 22 23 21* 21* 16*  GLUCOSE 110* 86 85 86 92  BUN 6 5* <5* <5* 8  CREATININE 0.99 0.75 0.73 0.67 0.71  CALCIUM  9.0 8.1* 8.7* 8.6* 9.1  MG  --   --  1.4* 1.4*  --    GFR: Estimated Creatinine Clearance: 79.6 mL/min (by C-G formula based on SCr of 0.71 mg/dL).  Liver Function Tests: Recent Labs  Lab 07/09/24 1602 07/14/24 0445  AST 146* 53*  ALT 62* 35  ALKPHOS 308* 234*  BILITOT 1.7* 1.2  PROT 7.5 6.7  ALBUMIN 4.0 3.8    CBG: Recent Labs  Lab 07/12/24 2354 07/13/24 0708 07/13/24 1645 07/14/24 0037 07/14/24 0729  GLUCAP 101* 103* 127* 123* 93    Recent Results (from the past 240 hours)  Gastrointestinal Panel by PCR , Stool     Status: None   Collection Time: 07/12/24 11:00 AM   Specimen: Stool  Result Value Ref Range Status   Campylobacter species NOT DETECTED NOT DETECTED Final   Plesimonas shigelloides NOT DETECTED NOT DETECTED Final   Salmonella species NOT DETECTED NOT DETECTED Final   Yersinia enterocolitica NOT DETECTED NOT DETECTED Final   Vibrio species NOT DETECTED NOT DETECTED Final   Vibrio cholerae NOT DETECTED NOT DETECTED Final   Enteroaggregative E coli (EAEC) NOT DETECTED NOT DETECTED Final   Enteropathogenic E coli (EPEC) NOT DETECTED NOT DETECTED Final   Enterotoxigenic E coli (ETEC) NOT DETECTED NOT DETECTED Final   Shiga like toxin producing E coli (STEC) NOT DETECTED NOT DETECTED Final   Shigella/Enteroinvasive E coli (EIEC) NOT DETECTED NOT DETECTED Final   Cryptosporidium NOT DETECTED NOT DETECTED Final   Cyclospora cayetanensis NOT DETECTED NOT DETECTED Final   Entamoeba histolytica NOT DETECTED NOT DETECTED Final   Giardia lamblia NOT DETECTED NOT DETECTED Final   Adenovirus F40/41 NOT DETECTED NOT DETECTED Final    Astrovirus NOT DETECTED NOT DETECTED Final   Norovirus GI/GII NOT DETECTED NOT DETECTED Final   Rotavirus A NOT DETECTED NOT DETECTED Final   Sapovirus (I, II, IV, and V) NOT DETECTED NOT DETECTED Final    Comment: Performed at [facility], [address] [place], [place] [address]  C Difficile Quick Screen w PCR reflex     Status: None   Collection Time: 07/12/24 11:00 AM   Specimen: Stool  Result Value Ref Range Status   C Diff antigen NEGATIVE NEGATIVE Final   C Diff toxin NEGATIVE NEGATIVE Final   C Diff interpretation No C. difficile detected.  Final    Comment: Performed at [facility], [address] [place], [place] [address]     Radiology Studies: No results found.  Scheduled Meds:  vitamin B-12  1,000 mcg Oral Daily   enoxaparin  (LOVENOX ) injection  40 mg Subcutaneous Q24H   famotidine   20 mg Oral QHS   feeding supplement  237 mL Oral TID BM   folic acid   1 mg Oral Daily   multivitamin with minerals  1 tablet Oral Daily   nicotine   21 mg Transdermal Daily   pantoprazole  (PROTONIX ) IV  40 mg Intravenous BID   saccharomyces boulardii  250 mg Oral BID   sucralfate   1 g Oral TID WC & HS   thiamine   100 mg Oral Daily   Or   thiamine   100 mg Intravenous Daily   Continuous Infusions:  promethazine  (PHENERGAN ) injection (IM or IVPB) 12.5 mg (07/13/24 2307)     LOS: 3 days     Derryl Duval, MD [facility]   To contact the attending provider between 7A-7P or the covering provider during after hours 7P-7A, please log into the web site [email or web address] and access using universal [place] password for that web site. If you do not have the password, please call the hospital operator.  07/14/2024, 11:42 AM    "

## 2024-07-14 NOTE — Plan of Care (Signed)

## 2024-07-14 NOTE — Progress Notes (Signed)
 Orthopedic Tech Progress Note Patient Details:  [name] 12/27/1965 [id number] [facility] made aware of TLSO brace Patient ID: Gabriel Koch, male   DOB: 1965/11/23, 59 y.o.   MRN: [id number]  Efrain DELENA Cos 07/14/2024, 11:26 AM

## 2024-07-15 DIAGNOSIS — S32009A Unspecified fracture of unspecified lumbar vertebra, initial encounter for closed fracture: Secondary | ICD-10-CM | POA: Diagnosis present

## 2024-07-15 DIAGNOSIS — F10930 Alcohol use, unspecified with withdrawal, uncomplicated: Secondary | ICD-10-CM | POA: Diagnosis not present

## 2024-07-15 DIAGNOSIS — R296 Repeated falls: Secondary | ICD-10-CM | POA: Diagnosis not present

## 2024-07-15 LAB — GLUCOSE, CAPILLARY: Glucose-Capillary: 103 mg/dL — ABNORMAL HIGH (ref 70–99)

## 2024-07-15 MED ORDER — CYANOCOBALAMIN 1000 MCG PO TABS: 1000.0000 ug | ORAL_TABLET | Freq: Every day | ORAL | 0 refills | Status: AC

## 2024-07-15 MED ORDER — OXYCODONE HCL 5 MG PO TABS: 5.0000 mg | ORAL_TABLET | ORAL | 0 refills | Status: AC | PRN

## 2024-07-15 NOTE — Plan of Care (Signed)
  Problem: Activity: Goal: Risk for activity intolerance will decrease Outcome: Progressing   Problem: Coping: Goal: Level of anxiety will decrease Outcome: Progressing   Problem: Pain Managment: Goal: General experience of comfort will improve and/or be controlled Outcome: Progressing   Problem: Safety: Goal: Ability to remain free from injury will improve Outcome: Progressing

## 2024-07-15 NOTE — Discharge Summary (Addendum)
 Physician Discharge Summary  Gabriel Koch FMW:[id number] DOB: Feb 07, 1966 DOA: 07/09/2024  PCP: Katrinka Aquas, MD  Admit date: 07/09/2024 Discharge date: 07/15/2024  Admitted From: Home Disposition: Home with [facility] PT  Recommendations for Outpatient Follow-up:  Follow up with PCP in 1 week with repeat CBC/BMP Wear TLSO brace when up and ambulating  Discharge Condition: Stable CODE STATUS: Full Diet recommendation: Heart healthy  Brief/Interim Summary:  Gabriel Koch is a 59 y.o. male with medical history significant for alcohol abuse, hypertension, diabetes mellitus, stroke, presented to the ER with multiple episodes of nausea, vomiting and diarrhea that started 2 days prior to presentation.  Patient reports poor oral intake, intermittent pain.  Also had fall with bruises on the left upper thigh as well as hip/flank area.  Patient feels shaky. Admitted for potential withdrawal   ED Course: Temperature 97.9.  Heart rate 91-107.  Respiratory rate 18-29.  Blood pressure systolic 130-144.  O2 sats 91- 100 percent on room air. Mild elevated liver enzymes AST 148, ALT 62, ALP 308, T. bili 1.7. Sodium 129. Lactic acid 2.7 > 2.1. CT chest abdomen pelvis with contrast shows likely soft tissue contusion with hematoma left flank.  Acute to subacute right L1 transverse process fracture.  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  Several old rib fractures.  Mesenteric stranding in gallbladder fossa. 1 L bolus given.  Admitted for further evaluation/management for potential withdrawal.  Also pain from fall as well as fracture of L1 and L2 transverse process.   Assessment & Plan:   Alcohol withdrawal (HCC) - Cessation counseling provided - Received minimal Ativan .  Continued on thiamine , multivitamins     hyponatremia - Will monitor.  sodium 133 on 1/7   Falls - Mechanical fall associated with alcohol intoxication - acute/subacute Transverse process L1 fracture .  Per ED  provider discussion with neurosurgery, TLSO recommended.  This was fitted prior to discharge. - Multiple old rib fractures - PT/OT--> home PT - Tylenol , oxycodone  as needed  HTN (hypertension) - Continue amlodipine   Diarrhea - Will monitor.   GERD (gastroesophageal reflux disease) - Continue PPI, tolerating diet okay   Hypokalemia - Replaced aggressively  Discharge Diagnoses:  Principal Problem:   Alcohol withdrawal (HCC) Active Problems:   Hyponatremia   Falls   Alcohol abuse   GERD (gastroesophageal reflux disease)   HTN (hypertension)   Hypokalemia   Diarrhea   Protein-calorie malnutrition, severe   Fracture of transverse process of lumbar vertebra [facility])    Discharge Instructions  Discharge Instructions     Ambulatory referral to Physical Therapy   Complete by: As directed    Call MD for:  persistant nausea and vomiting   Complete by: As directed    Call MD for:  redness, tenderness, or signs of infection (pain, swelling, redness, odor or green/yellow discharge around incision site)   Complete by: As directed    Call MD for:  temperature >100.4   Complete by: As directed    Diet general   Complete by: As directed    Discharge instructions   Complete by: As directed    1.  We strongly advise you to stop drinking.   2.  Continue medications that you have been taking prior to admission. 3. Wear back brace when up and ambulating.   Discharge wound care:   Complete by: As directed    Change dressing every 2 to 3 days   Increase activity slowly   Complete by: As directed  Allergies as of 07/15/2024       Reactions   Bee Venom Anaphylaxis        Medication List     STOP taking these medications    amoxicillin -clavulanate 875-125 MG tablet Commonly known as: AUGMENTIN    cephALEXin  500 MG capsule Commonly known as: KEFLEX    furosemide  20 MG tablet Commonly known as: LASIX    zolpidem 10 MG tablet Commonly known as: AMBIEN       TAKE  these medications    acetaminophen  500 MG tablet Commonly known as: TYLENOL  Take 1,000 mg by mouth every 6 (six) hours as needed for mild pain (pain score 1-3).   albuterol  108 (90 Base) MCG/ACT inhaler Commonly known as: VENTOLIN  HFA Inhale 1-2 puffs into the lungs every 6 (six) hours as needed for wheezing or shortness of breath.   amLODipine  5 MG tablet Commonly known as: NORVASC  Take 5 mg by mouth daily.   atorvastatin  40 MG tablet Commonly known as: LIPITOR Take 40 mg by mouth daily.   buPROPion  75 MG tablet Commonly known as: WELLBUTRIN  Take 150 mg by mouth 2 (two) times daily.   busPIRone  10 MG tablet Commonly known as: BUSPAR  Take 10 mg by mouth 2 (two) times daily.   cyanocobalamin  1000 MCG tablet Take 1 tablet (1,000 mcg total) by mouth daily. Start taking on: July 16, 2024   esomeprazole  40 MG capsule Commonly known as: NEXIUM  TAKE ONE CAPSULE BY MOUTH TWICE DAILY BEFORE meals   folic acid  1 MG tablet Commonly known as: FOLVITE  Take 1 mg by mouth daily.   gabapentin  100 MG capsule Commonly known as: NEURONTIN  Take 100 mg by mouth 3 (three) times daily.   meloxicam  7.5 MG tablet Commonly known as: MOBIC  Take 1 tablet (7.5 mg total) by mouth daily as needed for pain.   multivitamin with minerals Tabs tablet Take 1 tablet by mouth daily.   oxyCODONE  5 MG immediate release tablet Commonly known as: Oxy IR/ROXICODONE  Take 1 tablet (5 mg total) by mouth every 4 (four) hours as needed for up to 5 days for severe pain (pain score 7-10).   thiamine  100 MG tablet Commonly known as: Vitamin B-1 Take 1 tablet (100 mg total) by mouth daily.   tiotropium 18 MCG inhalation capsule Commonly known as: SPIRIVA  Place 18 mcg into inhaler and inhale daily.   traZODone  100 MG tablet Commonly known as: DESYREL  Take 100 mg by mouth at bedtime.               Discharge Care Instructions  (From admission, onward)           Start     Ordered    07/15/24 0000  Discharge wound care:       Comments: Change dressing every 2 to 3 days   07/15/24 1108            Follow-up Information     [facility] Follow up.   Contact information: ( OutPatient treatment Plan) [address] [place], [place] Walk in - (Mon- Fri) 5:30am-11AM Schedule appointment [phone]        Katrinka Aquas, MD. Schedule an appointment as soon as possible for a visit in 1 week(s).   Specialty: Internal Medicine Contact information: [address] [place] [address] [phone]                Allergies[1]  Consultations:    Procedures/Studies: CT CHEST ABDOMEN PELVIS W CONTRAST Result Date: 07/09/2024 CLINICAL DATA:  Several day history of nausea, vomiting, and diarrhea. Status post fall onto left side EXAM: CT CHEST, ABDOMEN, AND PELVIS WITH CONTRAST TECHNIQUE: Multidetector CT imaging of the chest, abdomen and pelvis was performed following the standard protocol during bolus administration of intravenous contrast. RADIATION DOSE REDUCTION: This exam was performed according to the departmental dose-optimization program which includes automated exposure control, adjustment of the mA and/or kV according to patient size and/or use of iterative reconstruction technique. CONTRAST:  OMNIPAQUE  IOHEXOL  300 MG/ML  SOLN COMPARISON:  CT chest, abdomen, and pelvis dated 11/02/2023 FINDINGS: CT CHEST FINDINGS Cardiovascular: Normal heart size. No significant pericardial fluid/thickening. Great vessels are normal in course and caliber. No central pulmonary emboli. Coronary artery calcifications. Mediastinum/Nodes: Imaged thyroid  gland without nodules meeting criteria for imaging follow-up by size. Normal esophagus. No pathologically enlarged axillary, supraclavicular, mediastinal, or hilar lymph nodes. Lungs/Pleura: The central airways are patent. No focal consolidation. No suspicious pulmonary nodules. No pneumothorax. No pleural effusion.  Musculoskeletal: Healing left anterolateral third through fifth rib fractures. 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 CT ABDOMEN PELVIS FINDINGS Hepatobiliary: Diffuse parenchymal hypoattenuation can be seen with hepatic steatosis. Subcentimeter segment 3 hypodensity (2:68) and 6 hypodensity (2:65), too small to characterize. No intra or extrahepatic biliary ductal dilation. Normal gallbladder. Mild mesenteric stranding in the region of the gallbladder fossa. Pancreas: No focal lesions or main ductal dilation. Spleen: Normal in size without focal abnormality. Adrenals/Urinary Tract: No adrenal nodules. No suspicious renal mass, calculi, or hydronephrosis. Underdistended urinary bladder. Stomach/Bowel: Normal appearance of the stomach. No evidence of bowel wall thickening, distention, or inflammatory changes. Appendectomy. Vascular/Lymphatic: Aortic atherosclerosis. No enlarged abdominal or pelvic lymph nodes. Reproductive: Prostate is unremarkable. Other: Small volume lower abdominal and pelvic free fluid measures simple attenuation. Musculoskeletal: Acute to subacute right L1 transverse process fracture (2:67). 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 Vertically oriented lucency through the left L5 pedicle (9:103) appears unchanged compared to 11/02/2023 and may represent a vascular channel. Subcutaneous soft tissue stranding in the left flank with ill-defined hyper attenuation. IMPRESSION: 1. Subcutaneous soft tissue stranding in the left flank with ill-defined hyper attenuation, likely representing a soft tissue contusion with hematoma. 2. Acute to subacute right L1 transverse process fracture. 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 3. Healing left anterolateral third through fifth rib fractures. 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 4. Mesenteric stranding in the gallbladder fossa and small volume lower abdominal and pelvic  free fluid, likely reactive. 5. Hepatic steatosis. 6.  Aortic Atherosclerosis (ICD10-I70.0). Electronically Signed   By: Limin  Xu M.D.   On: 07/09/2024 17:50   CT Cervical Spine Wo Contrast Result Date: 07/09/2024 CLINICAL DATA:  Fall EXAM: CT CERVICAL SPINE WITHOUT CONTRAST TECHNIQUE: Multidetector CT imaging of the cervical spine was performed without intravenous contrast. Multiplanar CT image reconstructions were also generated. RADIATION DOSE REDUCTION: This exam was performed according to the departmental dose-optimization program which includes automated exposure control, adjustment of the mA and/or kV according to patient size and/or use of iterative reconstruction technique. COMPARISON:  CT 11/02/2023 FINDINGS: Alignment: Facet alignment is normal. Straightening of the cervical spine. No subluxation Skull base and vertebrae: No acute fracture. No primary bone lesion or focal pathologic process. Soft tissues and spinal canal: No prevertebral fluid or swelling. No visible canal hematoma. Disc levels: Moderate disc space narrowing C5-C6 and mild to moderate narrowing at C6-C7. Posterior disc osteophyte complex at C5-C6 with mild mass effect on the ventral thecal  sac. Left foraminal narrowing at C5-C6. Upper chest: Mild emphysema Other: None IMPRESSION: Straightening of the cervical spine with degenerative changes. No acute osseous abnormality. Electronically Signed   By: Luke Bun M.D.   On: 07/09/2024 17:40   CT Head Wo Contrast Result Date: 07/09/2024 CLINICAL DATA:  Fall EXAM: CT HEAD WITHOUT CONTRAST TECHNIQUE: Contiguous axial images were obtained from the base of the skull through the vertex without intravenous contrast. RADIATION DOSE REDUCTION: This exam was performed according to the departmental dose-optimization program which includes automated exposure control, adjustment of the mA and/or kV according to patient size and/or use of iterative reconstruction technique. COMPARISON:  CT brain  11/02/2023, 02/02/2024 FINDINGS: Brain: No acute territorial infarction, hemorrhage or intracranial mass. Age advanced atrophy and chronic small vessel ischemic changes of the white matter. 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 Nonenlarged ventricles Vascular: No hyperdense vessels.  Carotid vascular calcification. Skull: Normal. Negative for fracture or focal lesion. Sinuses/Orbits: No acute finding. Other: None IMPRESSION: 1. No CT evidence for acute intracranial abnormality. 2. Age advanced atrophy and chronic small vessel ischemic changes of the white matter. Electronically Signed   By: Luke Bun M.D.   On: 07/09/2024 17:36   DG Foot 2 Views Left Result Date: 07/09/2024 CLINICAL DATA:  Left great toe wound and fall EXAM: LEFT FOOT - 2 VIEW COMPARISON:  July 01, 2024 FINDINGS: There is no evidence of fracture or dislocation. There is no evidence of arthropathy or other focal bone abnormality. Soft tissues are unremarkable. IMPRESSION: Negative. Electronically Signed   By: Lynwood Landy Raddle M.D.   On: 07/09/2024 17:04   DG Foot Complete Left Result Date: 07/01/2024 EXAM: 3 OR MORE VIEW(S) XRAY OF THE LEFT FOOT 07/01/2024 06:15:00 PM COMPARISON: None available. CLINICAL HISTORY: pain, wounds FINDINGS: BONES AND JOINTS: Findings suggestive of second through fifth hammertoe deformities. No cortical erosion or destruction. No acute fracture. SOFT TISSUES: The soft tissues are unremarkable. IMPRESSION: 1. No radiographic evidence of osteomyelitis. 2. No acute findings. Electronically signed by: Morgane Naveau MD 07/01/2024 07:30 PM EST RP Workstation: [id number]      Subjective: Asks if he could stay another night.  Discharge Exam: Vitals:   07/15/24 0312 07/15/24 0713  BP: 139/71 (!) 140/79  Pulse: 72   Resp: 16   Temp: 98.2 F (36.8 C)   SpO2: 96%     Chronically ill looking Chest: Clear CVs: S1, S2, no murmur, regular rhythm Abdomen: Soft, nontender, left flank  bruise Extremities: No edema    The results of significant diagnostics from this hospitalization (including imaging, microbiology, ancillary and laboratory) are listed below for reference.     Microbiology: Recent Results (from the past 240 hours)  Gastrointestinal Panel by PCR , Stool     Status: None   Collection Time: 07/12/24 11:00 AM   Specimen: Stool  Result Value Ref Range Status   Campylobacter species NOT DETECTED NOT DETECTED Final   Plesimonas shigelloides NOT DETECTED NOT DETECTED Final   Salmonella species NOT DETECTED NOT DETECTED Final   Yersinia enterocolitica NOT DETECTED NOT DETECTED Final   Vibrio species NOT DETECTED NOT DETECTED Final   Vibrio cholerae NOT DETECTED NOT DETECTED Final   Enteroaggregative E coli (EAEC) NOT DETECTED NOT DETECTED Final   Enteropathogenic E coli (EPEC) NOT DETECTED NOT DETECTED Final   Enterotoxigenic E coli (ETEC) NOT DETECTED NOT DETECTED Final   Shiga like toxin producing E coli (STEC) NOT DETECTED NOT DETECTED Final   Shigella/Enteroinvasive E  coli (EIEC) NOT DETECTED NOT DETECTED Final   Cryptosporidium NOT DETECTED NOT DETECTED Final   Cyclospora cayetanensis NOT DETECTED NOT DETECTED Final   Entamoeba histolytica NOT DETECTED NOT DETECTED Final   Giardia lamblia NOT DETECTED NOT DETECTED Final   Adenovirus F40/41 NOT DETECTED NOT DETECTED Final   Astrovirus NOT DETECTED NOT DETECTED Final   Norovirus GI/GII NOT DETECTED NOT DETECTED Final   Rotavirus A NOT DETECTED NOT DETECTED Final   Sapovirus (I, II, IV, and V) NOT DETECTED NOT DETECTED Final    Comment: Performed at [facility], [address] [place], [place] [address]  C Difficile Quick Screen w PCR reflex     Status: None   Collection Time: 07/12/24 11:00 AM   Specimen: Stool  Result Value Ref Range Status   C Diff antigen NEGATIVE NEGATIVE Final   C Diff toxin NEGATIVE NEGATIVE Final   C Diff interpretation No C. difficile detected.  Final     Comment: Performed at [facility], [address], [place], [place] [address]     Labs: BNP (last 3 results) Recent Labs    02/15/24 0145  BNP 54.0   Basic Metabolic Panel: Recent Labs  Lab 07/09/24 1602 07/10/24 0501 07/11/24 0410 07/12/24 0406 07/14/24 0445  NA 129* 131* 133* 133* 136  K 3.7 3.2* 3.7 3.4* 3.7  CL 93* 99 101 102 103  CO2 22 23 21* 21* 16*  GLUCOSE 110* 86 85 86 92  BUN 6 5* <5* <5* 8  CREATININE 0.99 0.75 0.73 0.67 0.71  CALCIUM  9.0 8.1* 8.7* 8.6* 9.1  MG  --   --  1.4* 1.4*  --    Liver Function Tests: Recent Labs  Lab 07/09/24 1602 07/14/24 0445  AST 146* 53*  ALT 62* 35  ALKPHOS 308* 234*  BILITOT 1.7* 1.2  PROT 7.5 6.7  ALBUMIN 4.0 3.8   Recent Labs  Lab 07/09/24 1602  LIPASE 65*   No results for input(s): AMMONIA in the last 168 hours. CBC: Recent Labs  Lab 07/09/24 1602 07/10/24 0501 07/11/24 0410 07/14/24 0445  WBC 6.7 2.9* 3.8* 3.0*  NEUTROABS 5.2  --   --  1.5*  HGB 9.8* 7.8* 7.8* 7.5*  HCT 28.3* 22.9* 23.2* 23.2*  MCV 108.0* 110.1* 111.0* 113.7*  PLT 164 100* 101* 140*   Cardiac Enzymes: No results for input(s): CKTOTAL, CKMB, CKMBINDEX, TROPONINI in the last 168 hours. BNP: Invalid input(s): POCBNP CBG: Recent Labs  Lab 07/14/24 0037 07/14/24 0729 07/14/24 1625 07/14/24 2325 07/15/24 0724  GLUCAP 123* 93 104* 115* 103*   D-Dimer No results for input(s): DDIMER in the last 72 hours. Hgb A1c No results for input(s): HGBA1C in the last 72 hours. Lipid Profile No results for input(s): CHOL, HDL, LDLCALC, TRIG, CHOLHDL, LDLDIRECT in the last 72 hours. Thyroid  function studies No results for input(s): TSH, T4TOTAL, T3FREE, THYROIDAB in the last 72 hours.  Invalid input(s): FREET3 Anemia work up No results for input(s): VITAMINB12, FOLATE, FERRITIN, TIBC, IRON, RETICCTPCT in the last 72 hours. Urinalysis    Component Value Date/Time   COLORURINE AMBER (A)  07/09/2024 1658   APPEARANCEUR CLEAR 07/09/2024 1658   APPEARANCEUR Clear 03/09/2013 2142   LABSPEC 1.024 07/09/2024 1658   LABSPEC 1.002 03/09/2013 2142   PHURINE 5.0 07/09/2024 1658   GLUCOSEU NEGATIVE 07/09/2024 1658   GLUCOSEU Negative 03/09/2013 2142   HGBUR MODERATE (A) 07/09/2024 1658   BILIRUBINUR NEGATIVE 07/09/2024 1658   BILIRUBINUR Negative 03/09/2013 2142  KETONESUR NEGATIVE 07/09/2024 1658   PROTEINUR 100 (A) 07/09/2024 1658   NITRITE NEGATIVE 07/09/2024 1658   LEUKOCYTESUR NEGATIVE 07/09/2024 1658   LEUKOCYTESUR Negative 03/09/2013 2142   Sepsis Labs Recent Labs  Lab 07/09/24 1602 07/10/24 0501 07/11/24 0410 07/14/24 0445  WBC 6.7 2.9* 3.8* 3.0*   Microbiology Recent Results (from the past 240 hours)  Gastrointestinal Panel by PCR , Stool     Status: None   Collection Time: 07/12/24 11:00 AM   Specimen: Stool  Result Value Ref Range Status   Campylobacter species NOT DETECTED NOT DETECTED Final   Plesimonas shigelloides NOT DETECTED NOT DETECTED Final   Salmonella species NOT DETECTED NOT DETECTED Final   Yersinia enterocolitica NOT DETECTED NOT DETECTED Final   Vibrio species NOT DETECTED NOT DETECTED Final   Vibrio cholerae NOT DETECTED NOT DETECTED Final   Enteroaggregative E coli (EAEC) NOT DETECTED NOT DETECTED Final   Enteropathogenic E coli (EPEC) NOT DETECTED NOT DETECTED Final   Enterotoxigenic E coli (ETEC) NOT DETECTED NOT DETECTED Final   Shiga like toxin producing E coli (STEC) NOT DETECTED NOT DETECTED Final   Shigella/Enteroinvasive E coli (EIEC) NOT DETECTED NOT DETECTED Final   Cryptosporidium NOT DETECTED NOT DETECTED Final   Cyclospora cayetanensis NOT DETECTED NOT DETECTED Final   Entamoeba histolytica NOT DETECTED NOT DETECTED Final   Giardia lamblia NOT DETECTED NOT DETECTED Final   Adenovirus F40/41 NOT DETECTED NOT DETECTED Final   Astrovirus NOT DETECTED NOT DETECTED Final   Norovirus GI/GII NOT DETECTED NOT DETECTED Final    Rotavirus A NOT DETECTED NOT DETECTED Final   Sapovirus (I, II, IV, and V) NOT DETECTED NOT DETECTED Final    Comment: Performed at [facility], [address] [place], [place] [address]  C Difficile Quick Screen w PCR reflex     Status: None   Collection Time: 07/12/24 11:00 AM   Specimen: Stool  Result Value Ref Range Status   C Diff antigen NEGATIVE NEGATIVE Final   C Diff toxin NEGATIVE NEGATIVE Final   C Diff interpretation No C. difficile detected.  Final    Comment: Performed at [facility], [address], [place], [place] [address]     Time coordinating discharge: 35 minutes  SIGNED:   Derryl Duval, MD  [facility] 07/15/2024, 4:10 PM       [1]  Allergies Allergen Reactions   Bee Venom Anaphylaxis

## 2024-07-15 NOTE — Progress Notes (Signed)
 Patient initially hesitant about being discharged home today but with some education, patient was agreeable. AVS teaching provided to patient. All questions/concerns answered. PIV and telemetry removed. Patient provided with oxycodone  prior to d/c to help with his pain for the ride home. Patient calling his ride.

## 2024-07-15 NOTE — TOC Transition Note (Signed)
 Transition of Care [facility]) - Discharge Note   Patient Details  Name: Gabriel Koch MRN: [id number] Date of Birth: 01-26-1966  Transition of [facility] CM/SW Contact:  Ronnald MARLA Sil, RN Phone Number: 07/15/2024, 2:04 PM   Clinical Narrative:    Patient discussed during morning's Progression rounds with plan to discharge home today with outpatient OT.  CM visited patient at bedside who indicated no preference for location, noted Amb Order already entered for [facility].  Patient denied any additional needs at this time, and stated my friend is downstairs waiting to take me home..   CM will continue to follow along and assist as appropriate     Final next level of care: OP Rehab Barriers to Discharge: No Barriers Identified   Patient Goals and CMS Choice Patient states their goals for this hospitalization and ongoing recovery are:: return home with OPPT  Discharge Plan and Services  DME Arranged: N/A DME Agency: NA  Social Drivers of Health (SDOH) Interventions SDOH Screenings   Food Insecurity: No Food Insecurity (07/09/2024)  Housing: Low Risk (07/09/2024)  Transportation Needs: No Transportation Needs (07/09/2024)  Utilities: Not At Risk (07/09/2024)  Tobacco Use: High Risk (07/09/2024)   Readmission Risk Interventions    02/04/2024    1:42 PM 02/04/2024   11:35 AM  Readmission Risk Prevention Plan  Transportation Screening Complete Complete  Home Care Screening Complete Complete  Medication Review (RN CM) Complete Complete
